# Patient Record
Sex: Female | Born: 1965
Health system: Southern US, Community
[De-identification: ages and names within clinical notes are randomized; demographics above are authoritative.]

## PROBLEM LIST (undated history)

## (undated) DIAGNOSIS — E119 Type 2 diabetes mellitus without complications: Secondary | ICD-10-CM

## (undated) DIAGNOSIS — Z923 Personal history of irradiation: Secondary | ICD-10-CM

## (undated) DIAGNOSIS — C801 Malignant (primary) neoplasm, unspecified: Secondary | ICD-10-CM

## (undated) DIAGNOSIS — Z1501 Genetic susceptibility to malignant neoplasm of breast: Secondary | ICD-10-CM

## (undated) DIAGNOSIS — Z9889 Other specified postprocedural states: Secondary | ICD-10-CM

## (undated) DIAGNOSIS — Z1502 Genetic susceptibility to malignant neoplasm of ovary: Secondary | ICD-10-CM

## (undated) DIAGNOSIS — R112 Nausea with vomiting, unspecified: Secondary | ICD-10-CM

## (undated) DIAGNOSIS — Z8041 Family history of malignant neoplasm of ovary: Secondary | ICD-10-CM

## (undated) DIAGNOSIS — Z806 Family history of leukemia: Secondary | ICD-10-CM

## (undated) DIAGNOSIS — Z803 Family history of malignant neoplasm of breast: Secondary | ICD-10-CM

## (undated) DIAGNOSIS — T8859XA Other complications of anesthesia, initial encounter: Secondary | ICD-10-CM

## (undated) DIAGNOSIS — D259 Leiomyoma of uterus, unspecified: Secondary | ICD-10-CM

## (undated) DIAGNOSIS — H269 Unspecified cataract: Secondary | ICD-10-CM

## (undated) DIAGNOSIS — Z8489 Family history of other specified conditions: Secondary | ICD-10-CM

## (undated) DIAGNOSIS — Z8481 Family history of carrier of genetic disease: Secondary | ICD-10-CM

## (undated) DIAGNOSIS — M1990 Unspecified osteoarthritis, unspecified site: Secondary | ICD-10-CM

## (undated) DIAGNOSIS — I872 Venous insufficiency (chronic) (peripheral): Secondary | ICD-10-CM

## (undated) DIAGNOSIS — N809 Endometriosis, unspecified: Secondary | ICD-10-CM

## (undated) DIAGNOSIS — Z800 Family history of malignant neoplasm of digestive organs: Secondary | ICD-10-CM

## (undated) DIAGNOSIS — K219 Gastro-esophageal reflux disease without esophagitis: Secondary | ICD-10-CM

## (undated) DIAGNOSIS — G4730 Sleep apnea, unspecified: Secondary | ICD-10-CM

## (undated) HISTORY — DX: Genetic susceptibility to malignant neoplasm of breast: Z15.01

## (undated) HISTORY — DX: Family history of malignant neoplasm of breast: Z80.3

## (undated) HISTORY — DX: Genetic susceptibility to malignant neoplasm of breast: Z15.02

## (undated) HISTORY — DX: Type 2 diabetes mellitus without complications: E11.9

## (undated) HISTORY — PX: OTHER SURGICAL HISTORY: SHX169

## (undated) HISTORY — PX: CHOLECYSTECTOMY: SHX55

## (undated) HISTORY — DX: Endometriosis, unspecified: N80.9

## (undated) HISTORY — DX: Family history of leukemia: Z80.6

## (undated) HISTORY — DX: Leiomyoma of uterus, unspecified: D25.9

## (undated) HISTORY — PX: EYE SURGERY: SHX253

## (undated) HISTORY — DX: Family history of malignant neoplasm of digestive organs: Z80.0

## (undated) HISTORY — DX: Venous insufficiency (chronic) (peripheral): I87.2

## (undated) HISTORY — DX: Unspecified cataract: H26.9

## (undated) HISTORY — PX: WISDOM TOOTH EXTRACTION: SHX21

## (undated) HISTORY — DX: Family history of carrier of genetic disease: Z84.81

## (undated) HISTORY — PX: MYOMECTOMY: SHX85

## (undated) HISTORY — PX: APPENDECTOMY: SHX54

## (undated) HISTORY — DX: Family history of malignant neoplasm of ovary: Z80.41

## (undated) HISTORY — DX: Sleep apnea, unspecified: G47.30

---

## 2000-07-23 ENCOUNTER — Ambulatory Visit (HOSPITAL_COMMUNITY): Admission: RE | Admit: 2000-07-23 | Discharge: 2000-07-23 | Payer: Self-pay | Admitting: Family Medicine

## 2003-05-18 ENCOUNTER — Ambulatory Visit (HOSPITAL_COMMUNITY): Admission: RE | Admit: 2003-05-18 | Discharge: 2003-05-18 | Payer: Self-pay | Admitting: Family Medicine

## 2004-02-04 ENCOUNTER — Emergency Department (HOSPITAL_COMMUNITY): Admission: EM | Admit: 2004-02-04 | Discharge: 2004-02-04 | Payer: Self-pay | Admitting: Emergency Medicine

## 2004-02-04 IMAGING — US US ABDOMEN COMPLETE
1 series · 14 of 25 positions shown · non-contrast
Comparison: none

CLINICAL DATA: 38-year-old with abdominal pain. 
 ABDOMINAL ULTRASOUND COMPLETE:
 Within the liver, there are two well circumscribed hyperechoic lesions measuring 1.5 X 1.4 X 1.2 cm and 1.2 X 1.1 X 1.0 cm.  The findings are consistent with small hemangiomas.  The gallbladder has a normal appearance.  The inferior vena cava and visualized abdominal aorta are unremarkable. The pancreas is obscured by bowel gas.  No focal abnormality is seen within the spleen or kidneys.

[Series 1: abdomen · 0.33mm/px · 14 of 70 slices shown]
[im 1/70]
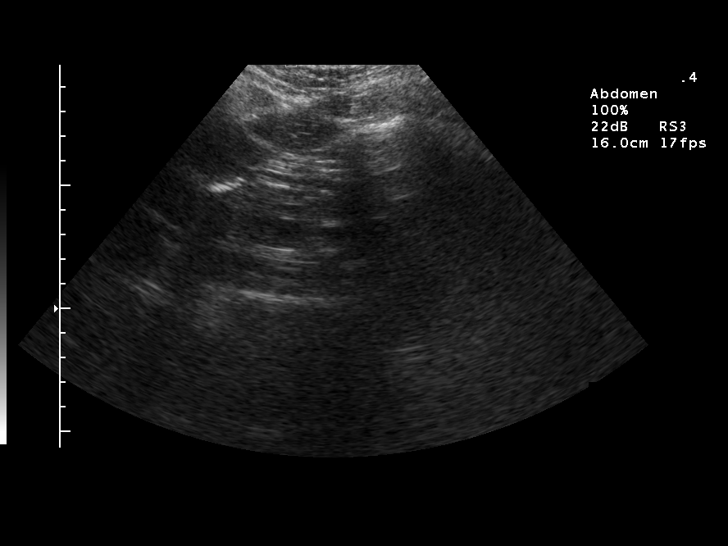
[im 6/70]
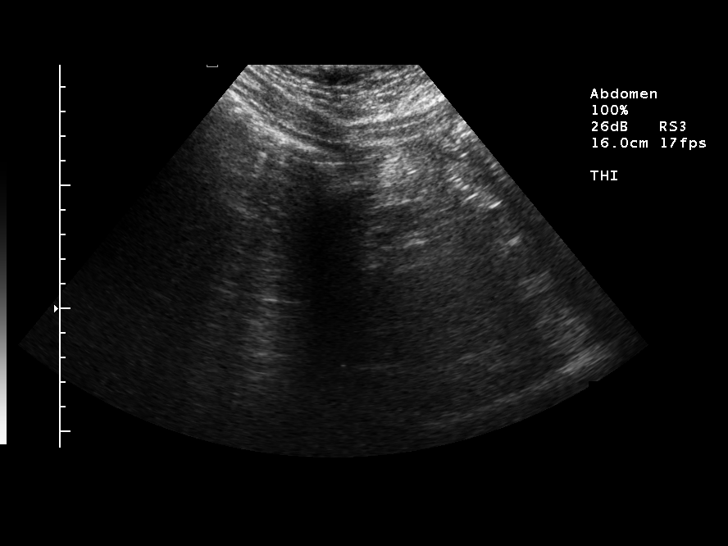
[im 12/70]
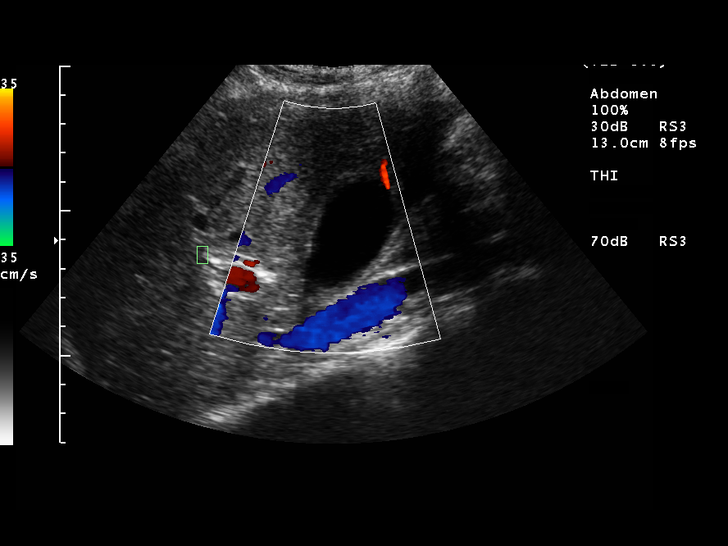
[im 18/70]
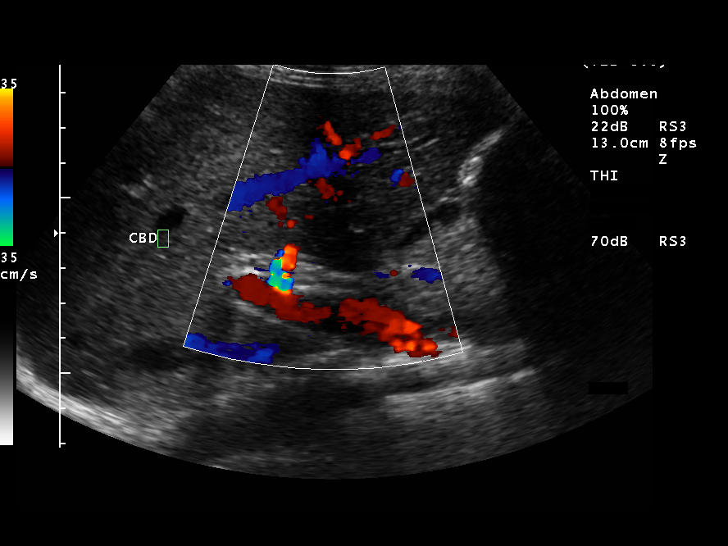
[im 24/70]
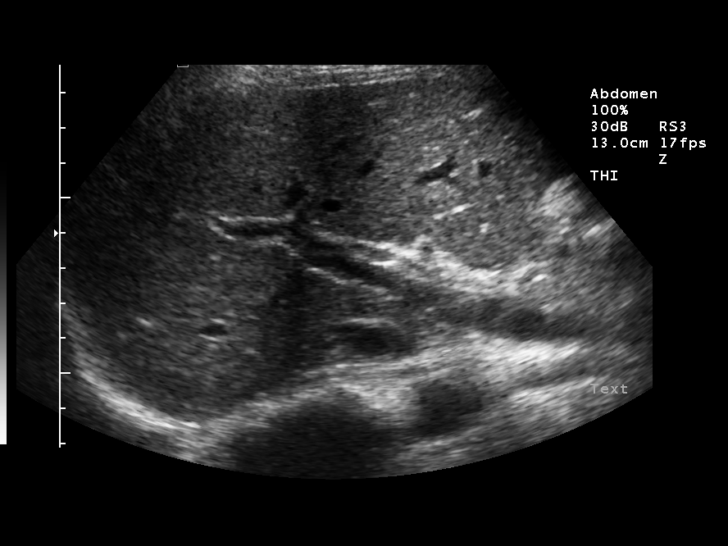
[im 26/70]
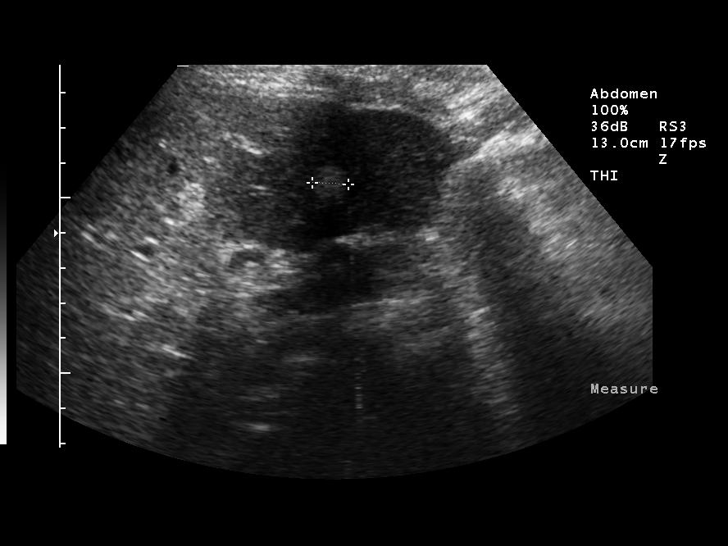
[im 32/70]
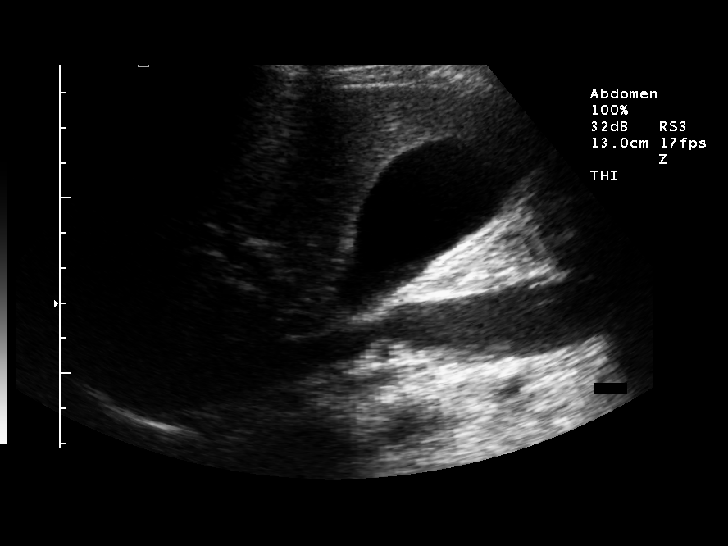
[im 38/70]
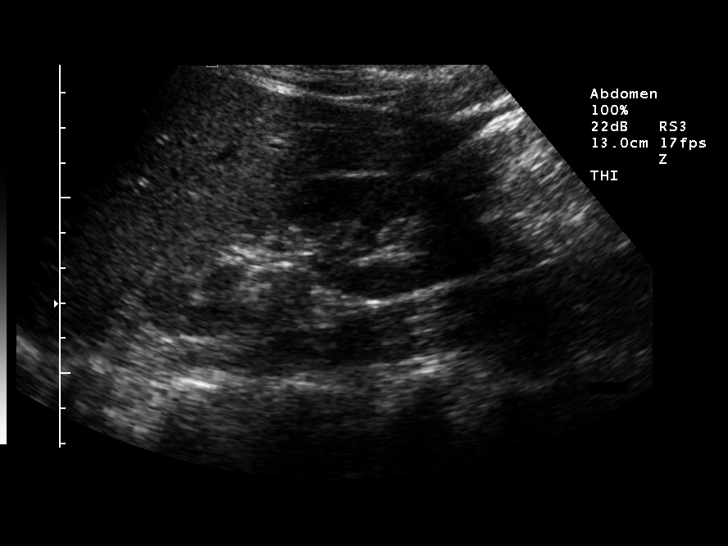
[im 44/70]
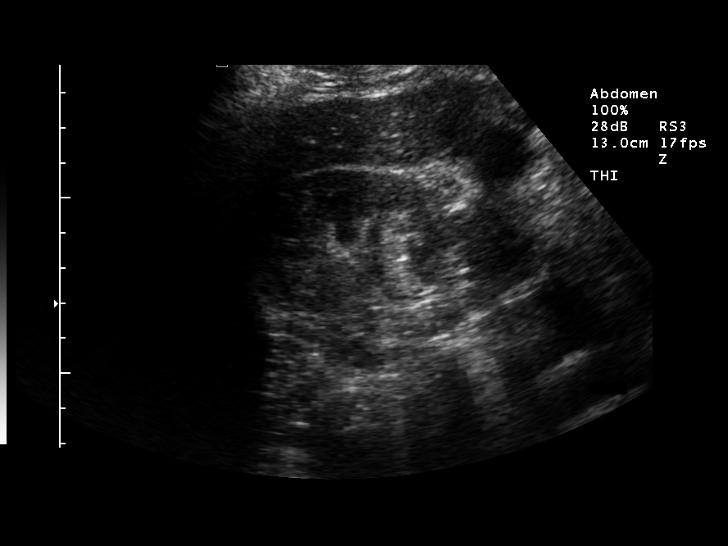
[im 47/70]
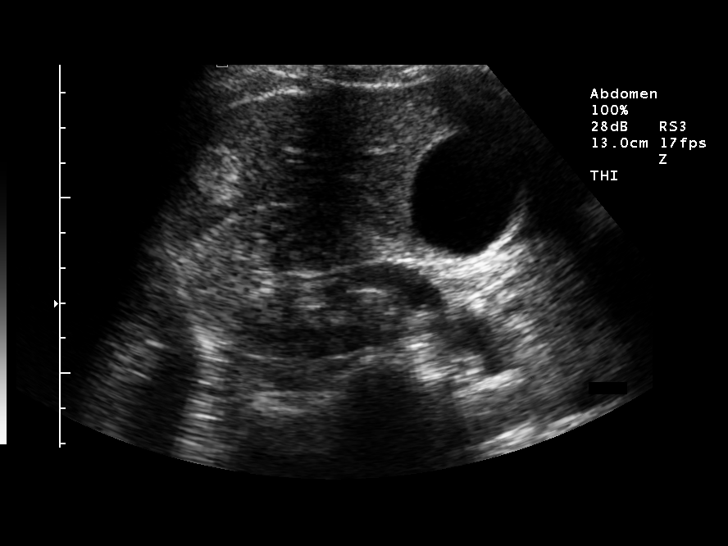
[im 52/70]
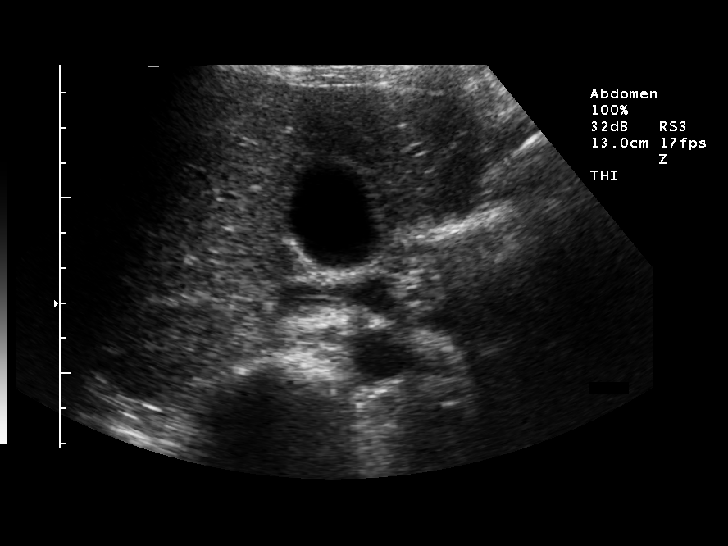
[im 58/70]
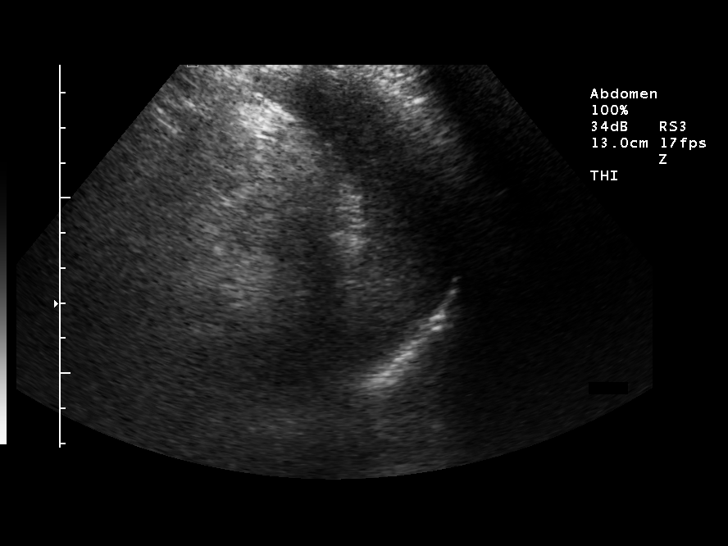
[im 64/70]
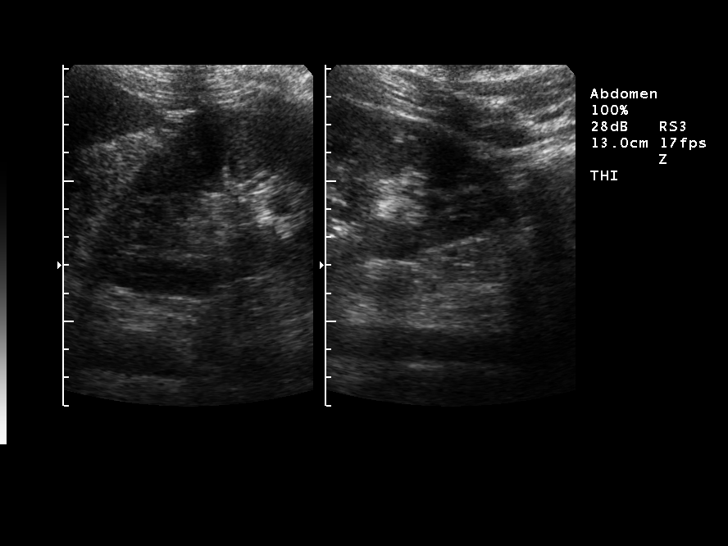
[im 70/70]
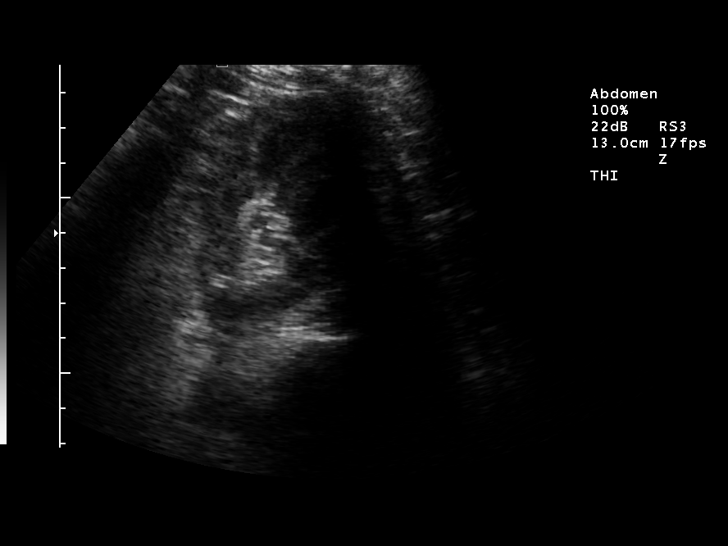

[14 of 25 positions shown; findings below may reference images not displayed]

IMPRESSION: 1.  Small hemangiomas within the liver.  No evidence for acute cholecystitis or other abdominal abnormality.
 2.  Obscured pancreas.

## 2005-03-26 ENCOUNTER — Other Ambulatory Visit: Admission: RE | Admit: 2005-03-26 | Discharge: 2005-03-26 | Payer: Self-pay | Admitting: Obstetrics and Gynecology

## 2005-04-01 ENCOUNTER — Emergency Department (HOSPITAL_COMMUNITY): Admission: EM | Admit: 2005-04-01 | Discharge: 2005-04-01 | Payer: Self-pay | Admitting: Emergency Medicine

## 2005-06-02 ENCOUNTER — Emergency Department (HOSPITAL_COMMUNITY): Admission: EM | Admit: 2005-06-02 | Discharge: 2005-06-02 | Payer: Self-pay | Admitting: Emergency Medicine

## 2005-06-02 IMAGING — US US ABDOMEN COMPLETE
1 series · 13 of 25 positions shown · non-contrast
Comparison: [DATE].
ABDOMEN ULTRASOUND:

CLINICAL DATA: 39-year-old female with abdominal pain.  Vomiting.  Patient has had a previous left ovarian cystectomy and myometomy.
TECHNIQUE: Complete abdominal ultrasound examination was performed including evaluation of the liver, gallbladder, bile ducts, pancreas, kidneys, spleen, IVC, and abdominal aorta.

[Series 1: unknown · 0.34mm/px · 13 of 58 slices shown]
[im 1/58]
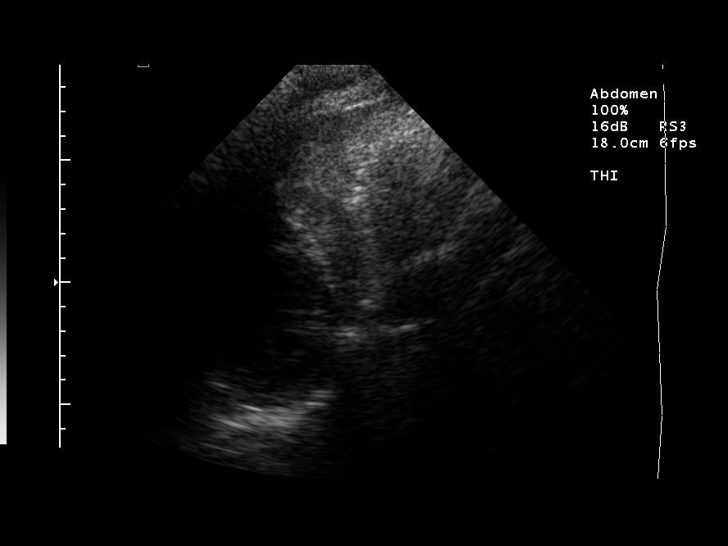
[im 5/58]
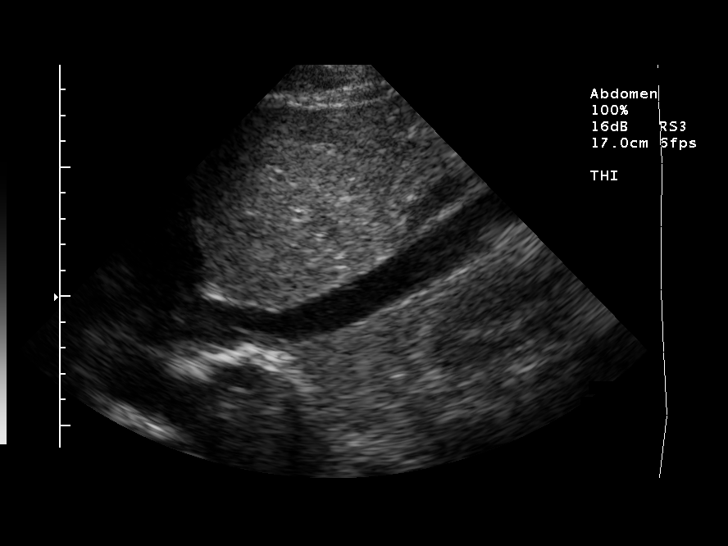
[im 10/58]
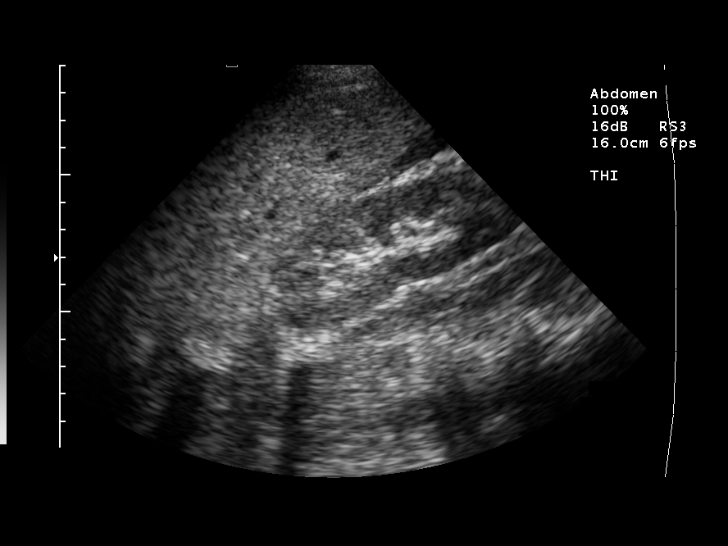
[im 15/58]
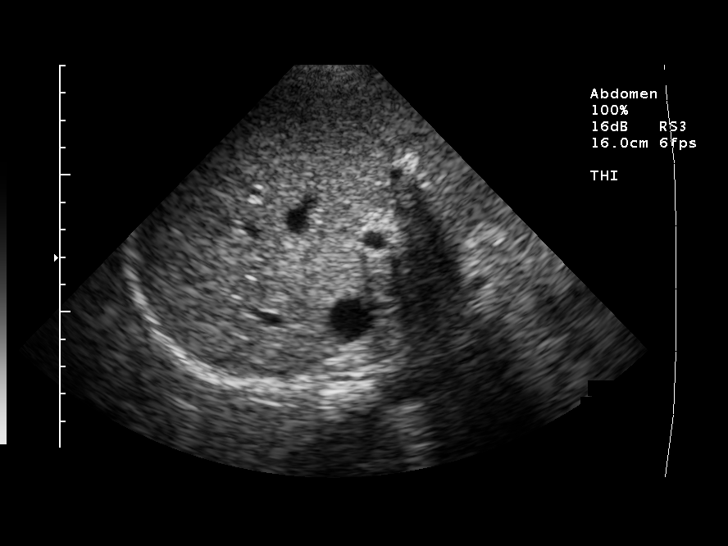
[im 20/58]
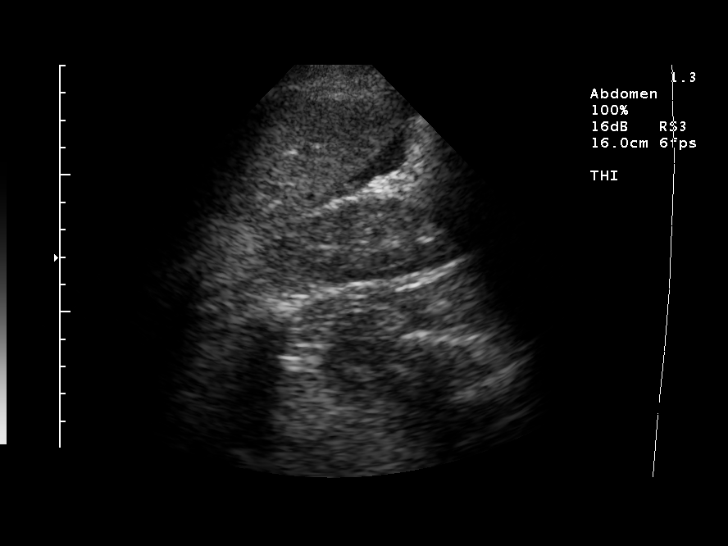
[im 24/58]
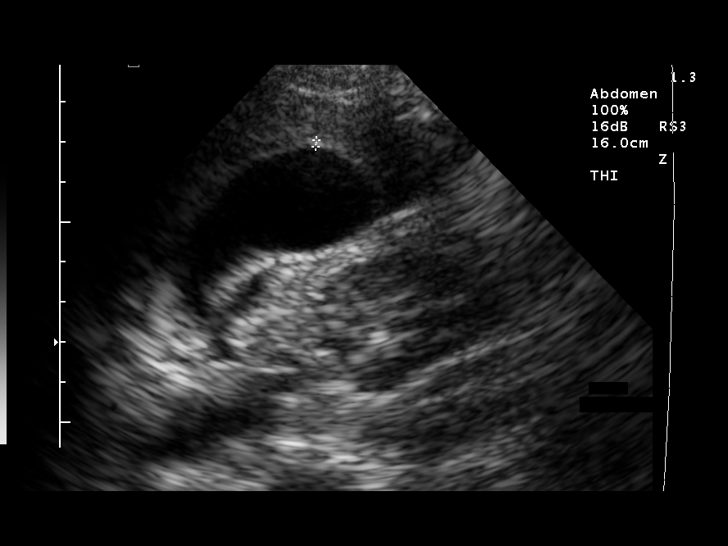
[im 29/58]
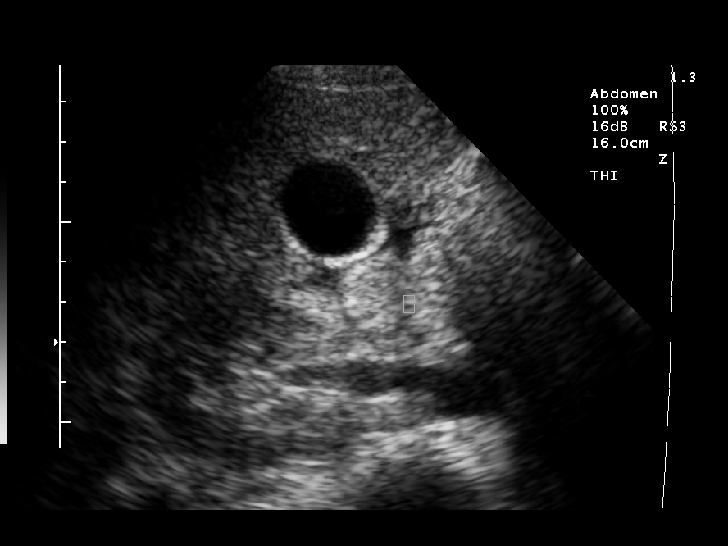
[im 34/58]
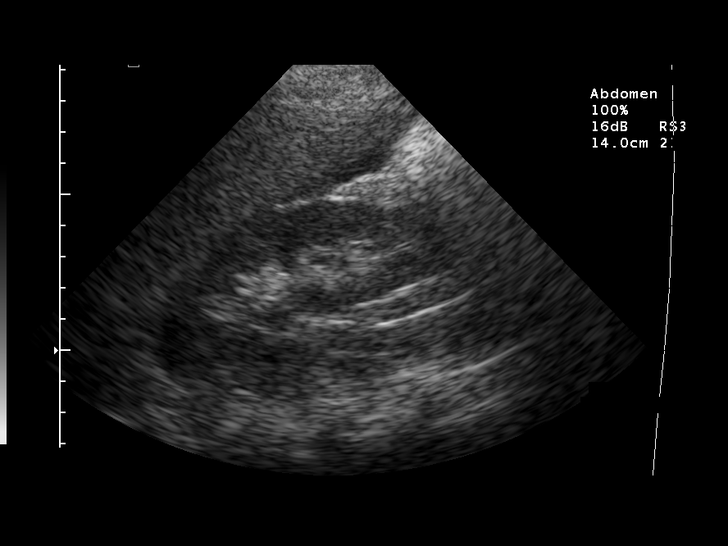
[im 39/58]
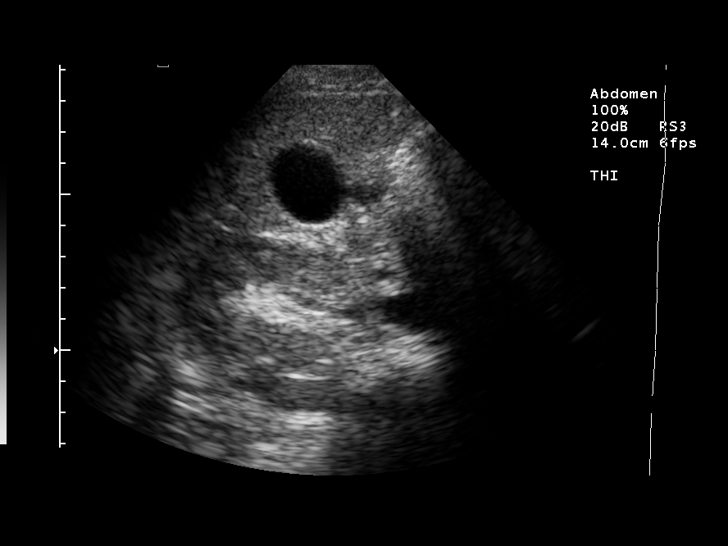
[im 43/58]
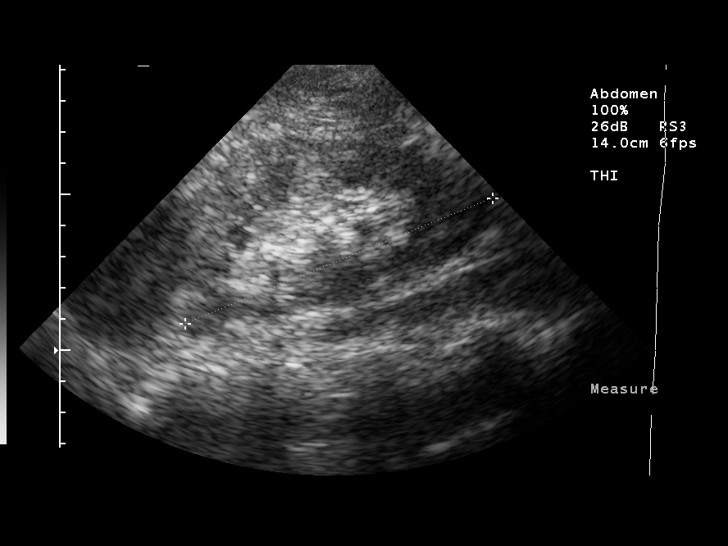
[im 48/58]
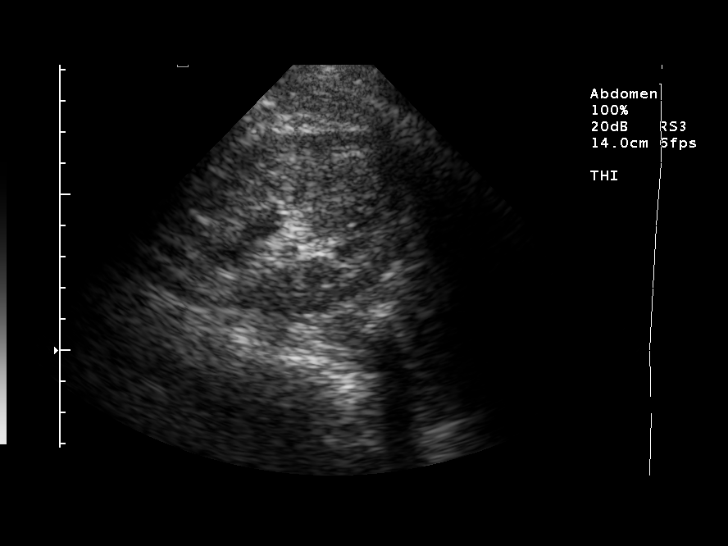
[im 53/58]
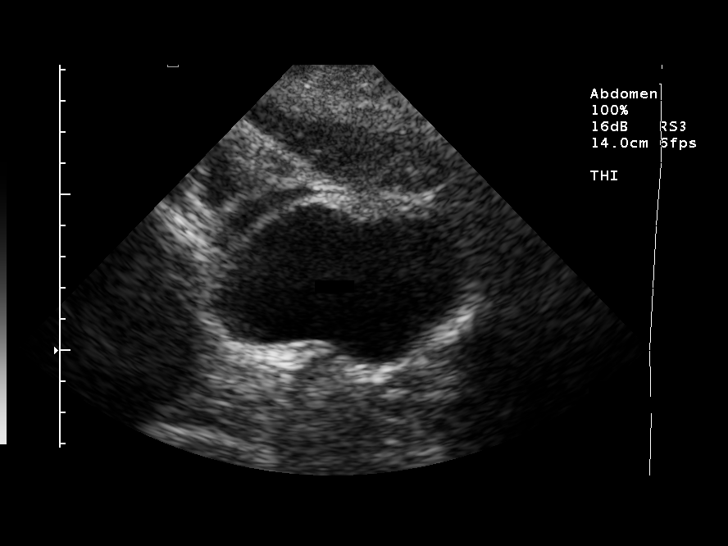
[im 58/58]
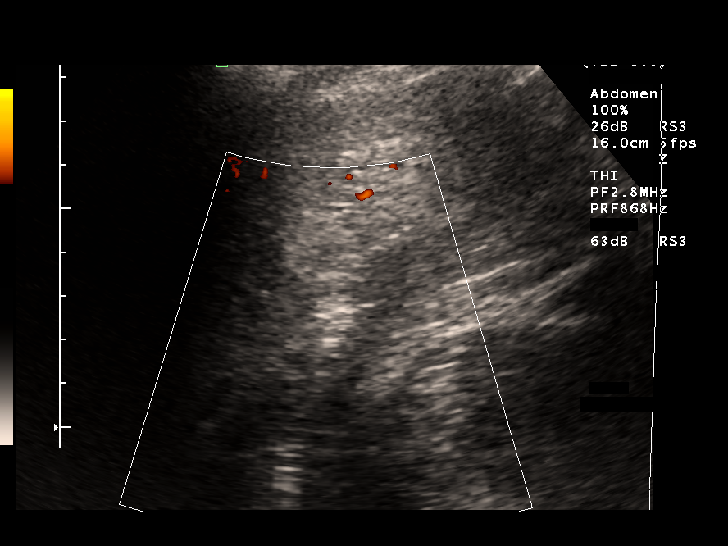

[13 of 25 positions shown; findings below may reference images not displayed]

FINDINGS: The gallbladder, common bile duct, IVC, spleen, kidneys, and aorta are all within normal limits.  No acute finding by abdominal ultrasound.  Specifically, the gallbladder wall thickness is normal at 1.6 mm.  No gallstones or gallbladder wall thickening.  There was no evidence of a Murphy?s sign.  A small amount of abdominal ascites is noted with some pericholecystic fluid.  The common bile duct diameter is 2.7 mm.  The liver has a length of 14 cm.  Within the liver, there is a stable echogenic focus in the right hepatic dome, consistent with a hemangioma compared to the previous study.  The second previous hemangioma in the left hepatic lobe lateral segment was not visualized on today?s study.  Imaging of the pancreas was limited because of bowel gas.  The spleen measures 8 cm.  The right kidney measures 10 cm and the left kidney measures 10.6 cm.  Aorta has a maximal diameter of 18 mm.  No aneurysm or ascites.  In the pelvis, there is a small amount of free fluid.
IMPRESSION: 1.  Small amount of abdominal and pelvic free fluid, consistent with ascites.  No clear acute etiology by abdominal ultrasound.  
2.  Normal gallbladder without gallstones.  
3.  No biliary dilatation.

## 2005-06-03 IMAGING — CT CT ABDOMEN W/ CM
3 of 5 series · 14 of 32 positions shown, 18 images · IV contrast (100 ML OMNI 300)
Comparison: none

CLINICAL DATA: 39-year-old female with abdominal pain, nausea, and vomiting.  Recently postoperative ovarian surgery and myomectomy 3 weeks ago.  
 ABDOMEN CT WITH CONTRAST:
TECHNIQUE: Multidetector CT imaging of the abdomen was performed following the standard protocol during bolus administration of intravenous contrast.
 Contrast:  100 cc Omnipaque 300.
TECHNIQUE: Multidetector CT imaging of the pelvis was performed following the standard protocol during bolus administration of intravenous contrast.

[Series 2: routine abdomen · axial · 0.60mm/px · z∈[-321,-111]mm · 3 of 84 slices shown, 7 images]
[im 21/84  soft-tissue]
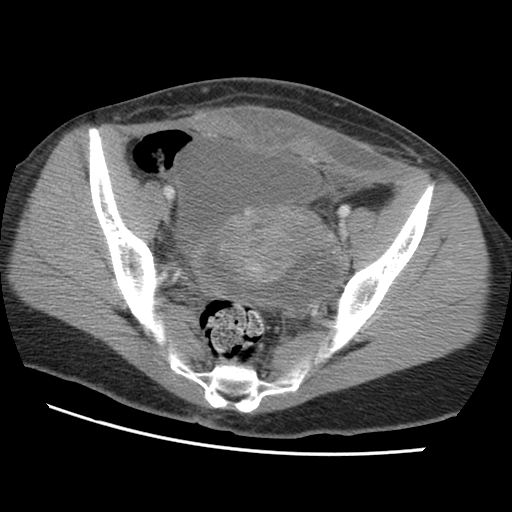
[im 21/84  lung]
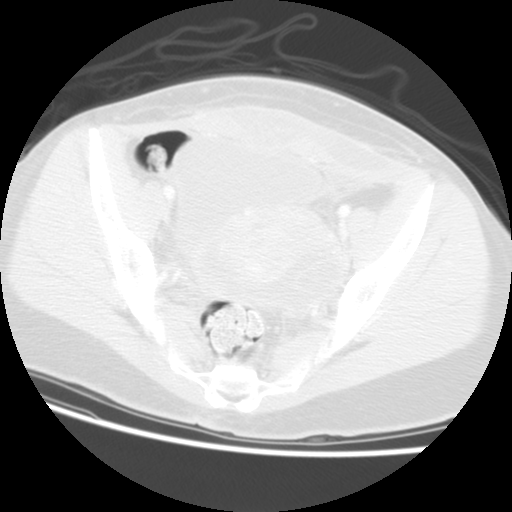
[im 21/84  bone]
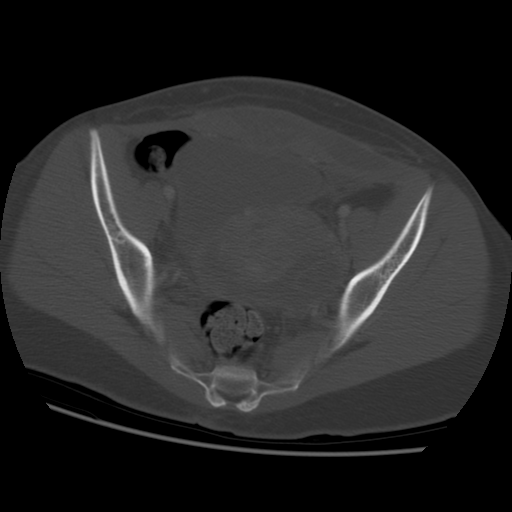
[im 42/84  soft-tissue]
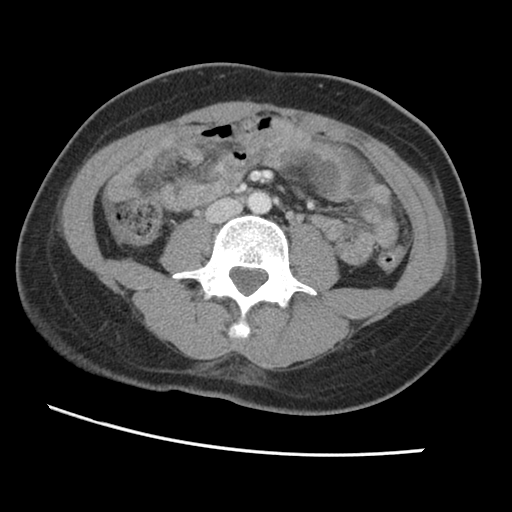
[im 42/84  lung]
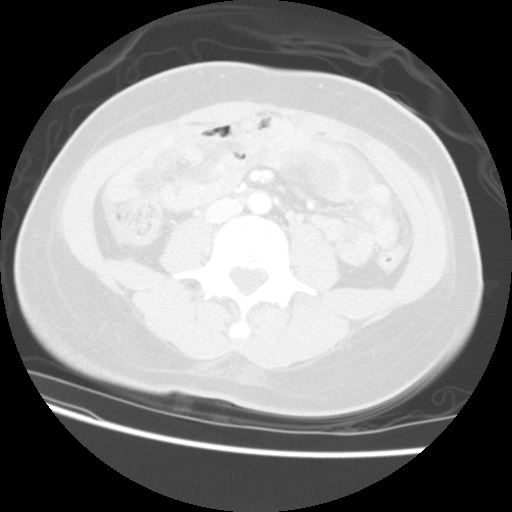
[im 63/84  soft-tissue]
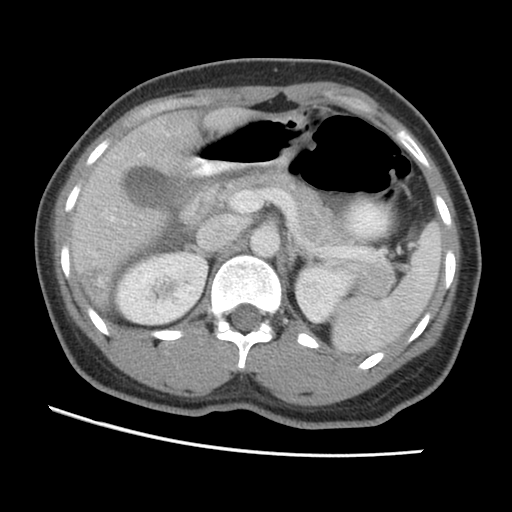
[im 63/84  lung]
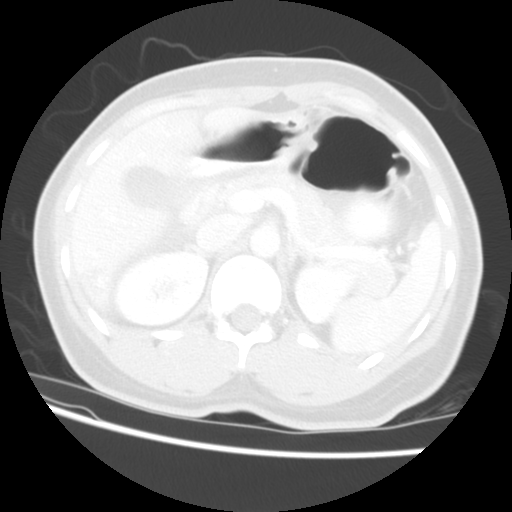

[Series 104: reformatted · coronal · 0.88mm/px · 3 of 202 slices shown (1 of 2)]
[im 21/202  soft-tissue]
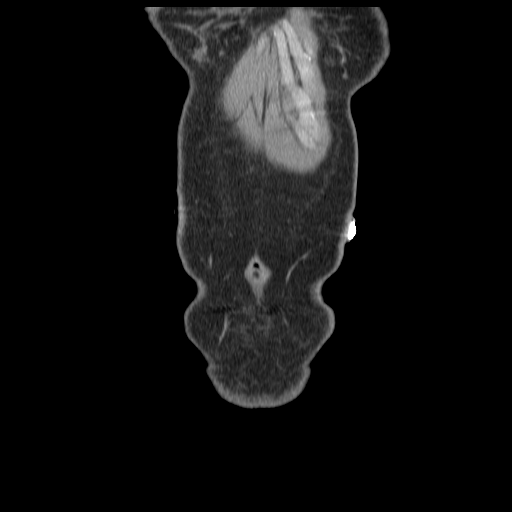
[im 41/202  soft-tissue]
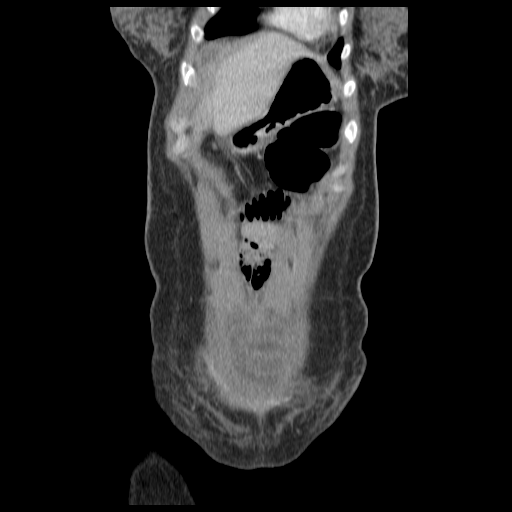
[im 61/202  soft-tissue]
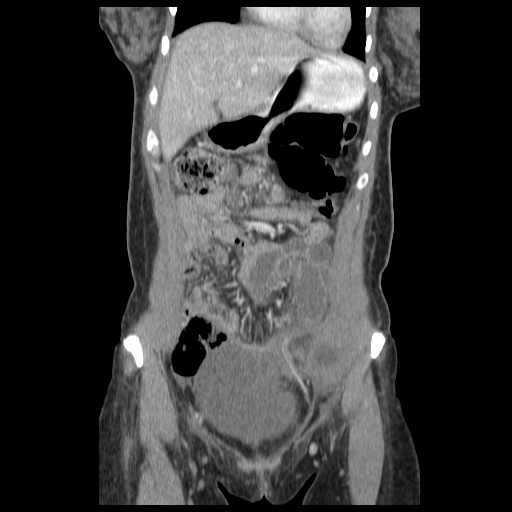

[Series 105: reformatted · sagittal · 0.88mm/px · 8 of 234 slices shown (2 of 2)]
[im 20/234  soft-tissue]
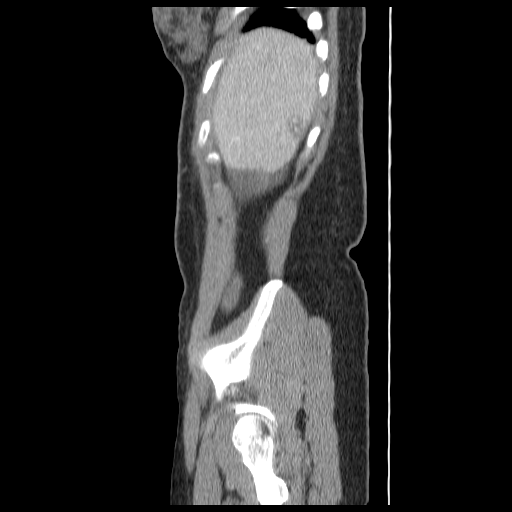
[im 59/234  soft-tissue]
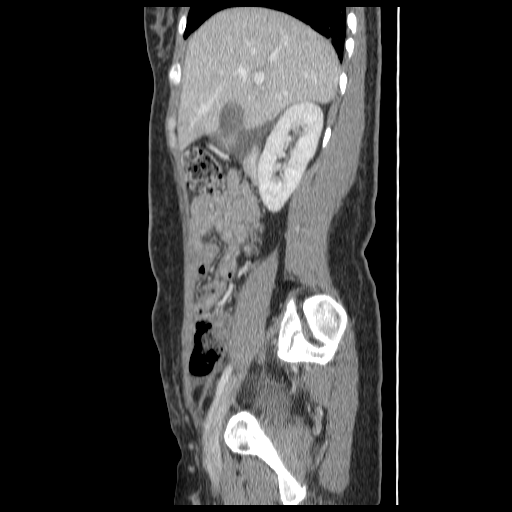
[im 78/234  soft-tissue]
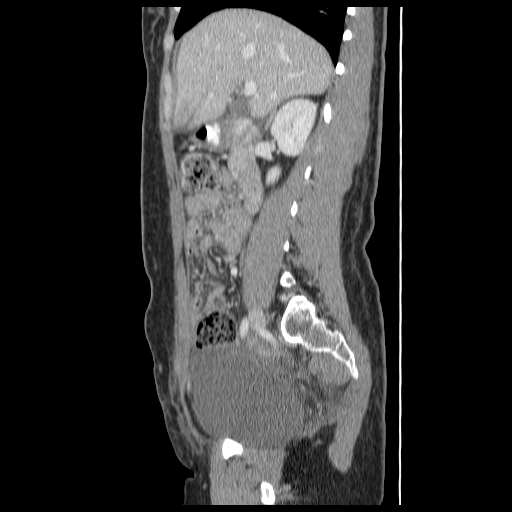
[im 98/234  soft-tissue]
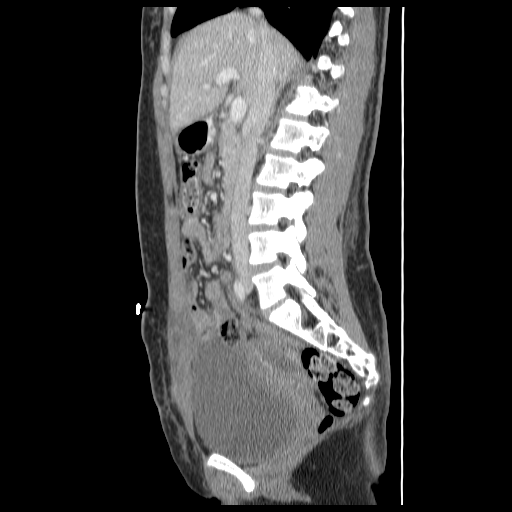
[im 136/234  soft-tissue]
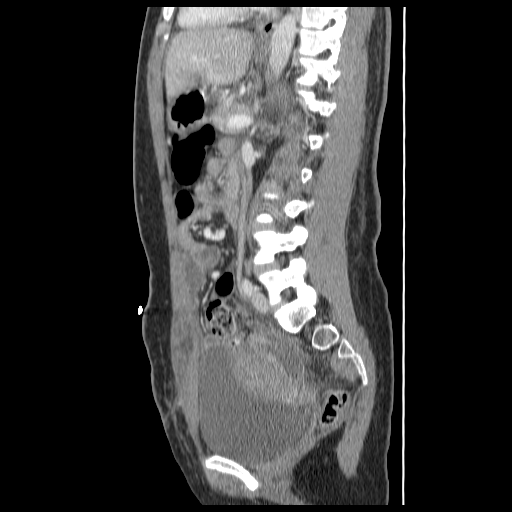
[im 156/234  soft-tissue]
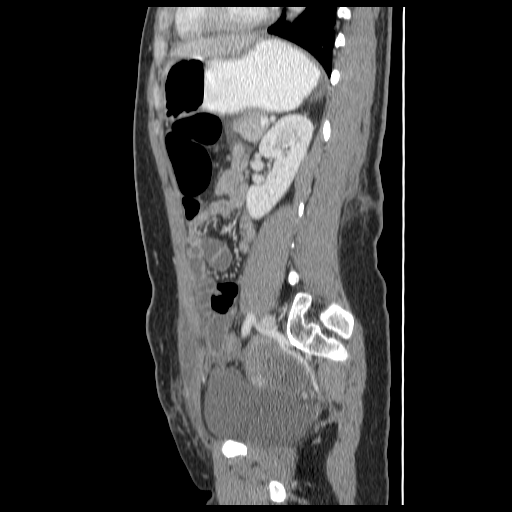
[im 175/234  soft-tissue]
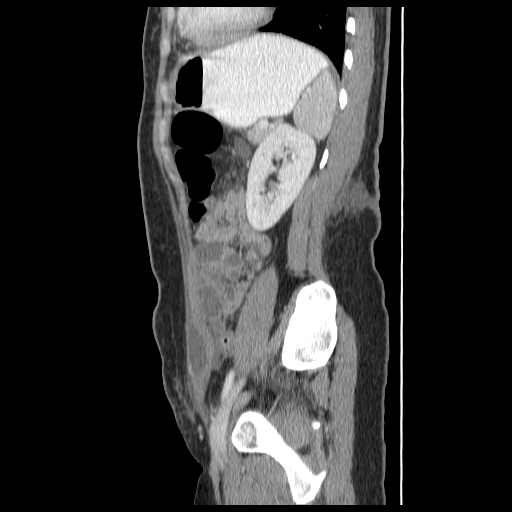
[im 214/234  soft-tissue]
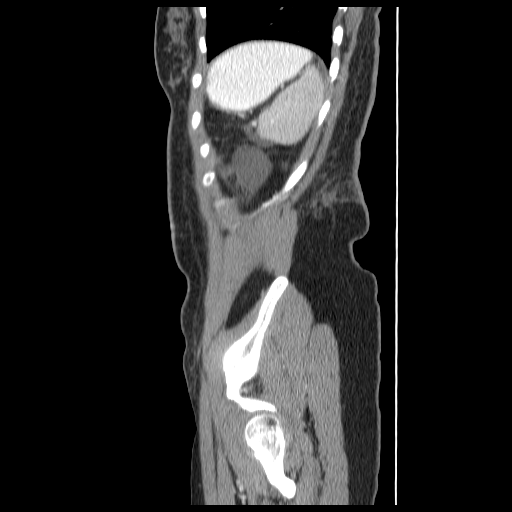

[14 of 32 positions shown; findings below may reference images not displayed]

FINDINGS: Bibasilar atelectasis.  No pericardial or pleural fluid.  Normal heart size.  The liver demonstrates focal fatty infiltration along the falciform ligament.  In the right hepatic lobe, there are 2 vascular enhancing lesions, 1 anteriorly on image #17 and 1 inferiorly and posterior on image #22.  These are consistent with hemangiomas.  Hepatic and portal veins are patent.  The gallbladder, biliary system, kidneys, adrenal glands, pancreas, and spleen are normal.  A small amount of perihepatic and perisplenic ascites is evident.  Free fluid extends along both paracolic gutters into the pelvis.  No bowel obstruction, definite wall thickening, adenopathy, or free air.  There is gaseous distention of the splenic flexure.
IMPRESSION: 1.  Hepatic hemangiomas.  
 2.  Focal fatty infiltration of the liver anteriorly.  
 3.  Small amount of abdominal ascites.
 PELVIS CT WITH CONTRAST:
FINDINGS: Continuing into the pelvis, below the umbilicus, there is a small midline ventral hernia with protrusion of fat.  Also, along the anterior lower abdominal wall, there is an elongated fluid collection within the abdominal wall musculature and fascia.  This could represent a resolving seroma or hematoma.  No significant inflammation or air within the cavity to suggest an abscess.  Small amount of free fluid is noted in the pelvis.  The uterus is heterogeneous with scattered areas of irregular enhancement and low attenuation with mild enlargement.  This may relate to postoperative changes from myomectomy.  The bladder is moderately distended.  Left adnexal low density lesion is noted, roughly measuring 4 cm.  This could represent a left ovarian cyst.
IMPRESSION: 1.  Small midline ventral hernia below the umbilicus containing fat.  
 2.  Adjacent peri-ventral hernia, abdominal musculature fluid collection.  This could represent postoperative hematoma, seroma, or less likely abscess.  
 3.  Small amount of pelvic free fluid.  
 4.  Heterogeneous changes of the uterus, nonspecific, but likely postoperative.
 5.  Left adnexal 4 cm low density lesion, possible ovarian cyst.  Ultrasound correlation could confirm.

## 2005-06-03 IMAGING — CR DG ABDOMEN ACUTE W/ 1V CHEST
3 series · 3 of 3 positions shown · non-contrast
Comparison: Abdominal ultrasound of the same day.

CLINICAL DATA: 39 year old female; abdominal pain and vomiting.
 ACUTE ABDOMINAL SERIES WITH CHEST ? 3 VIEWS ? [DATE]:

[w chest pa]
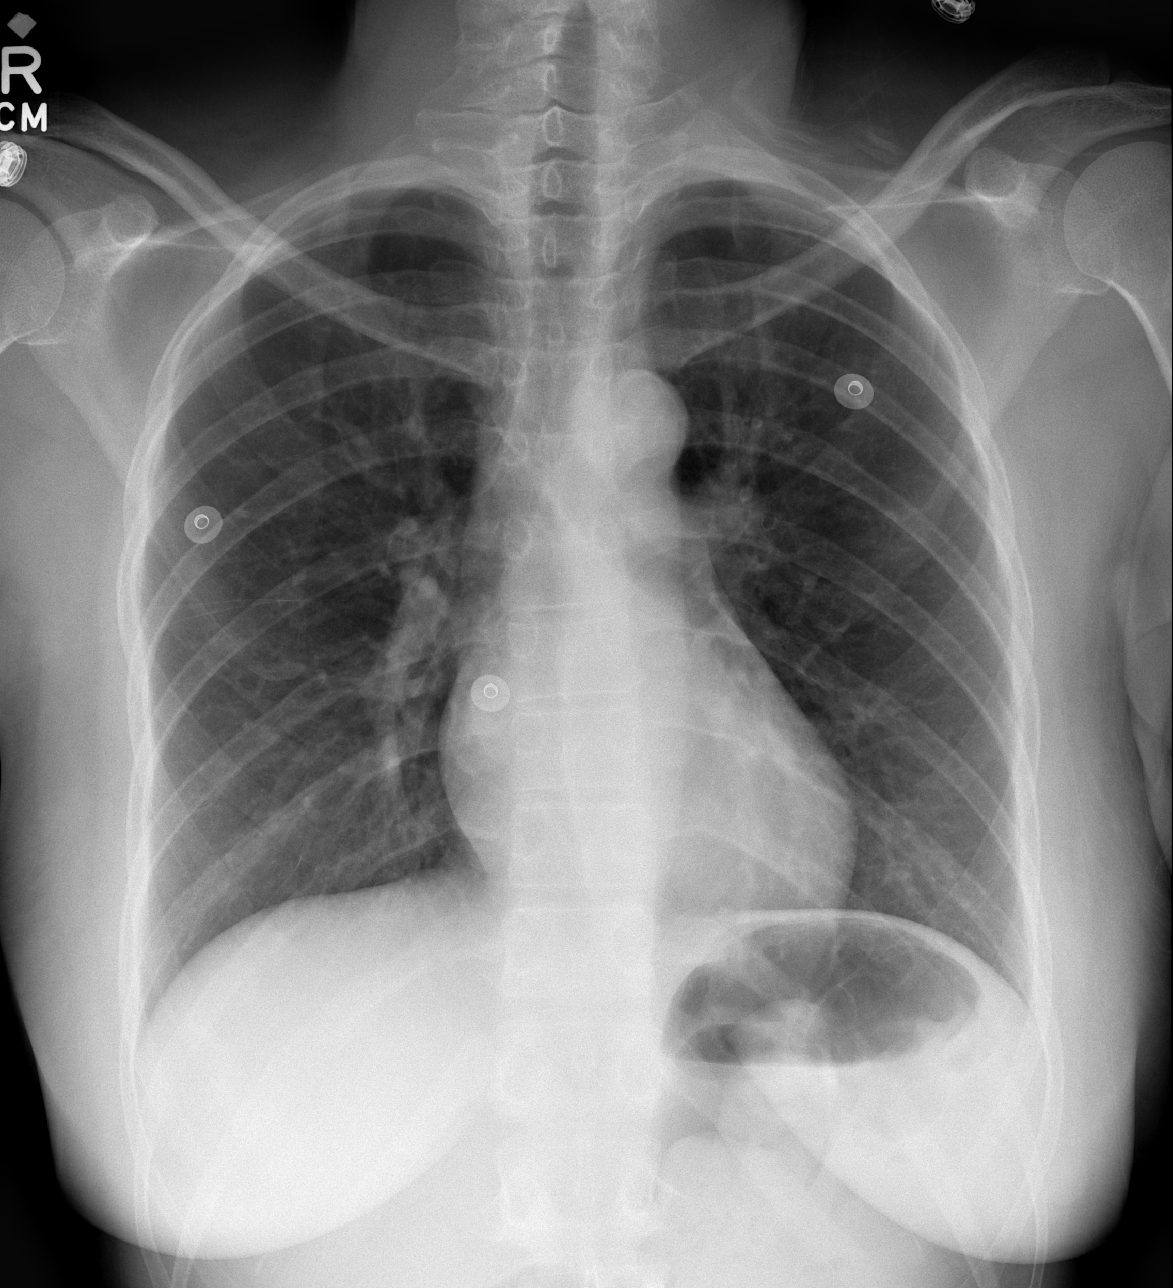

[w abdomen upright]
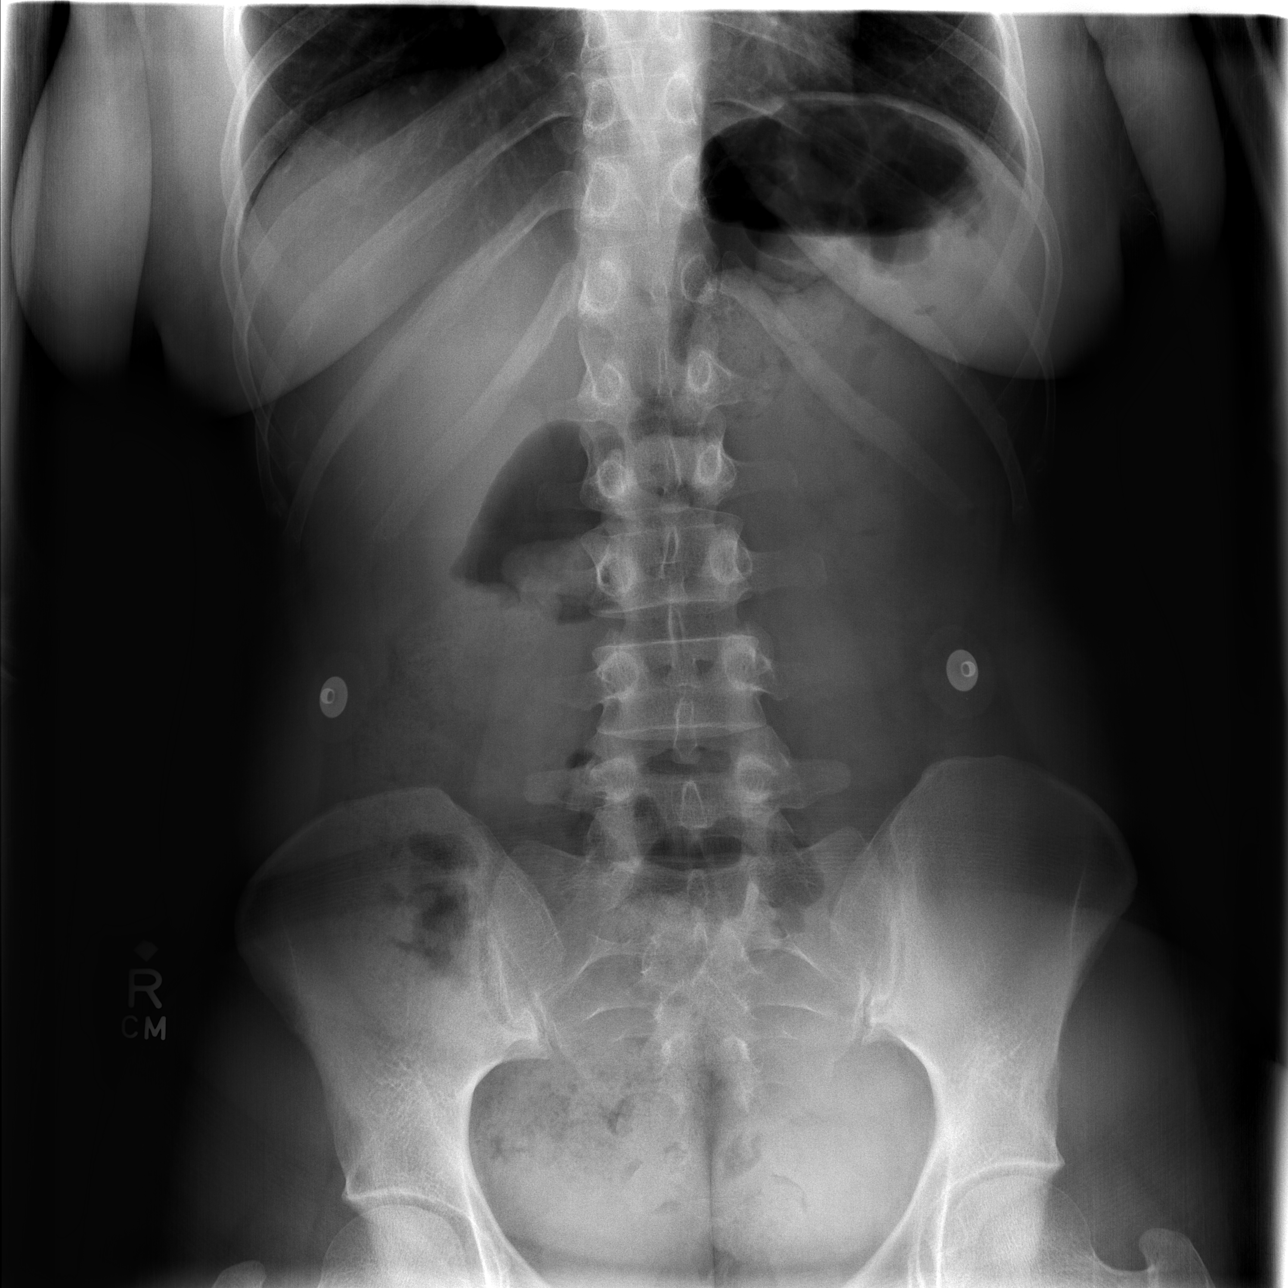

[t abdomen supine]
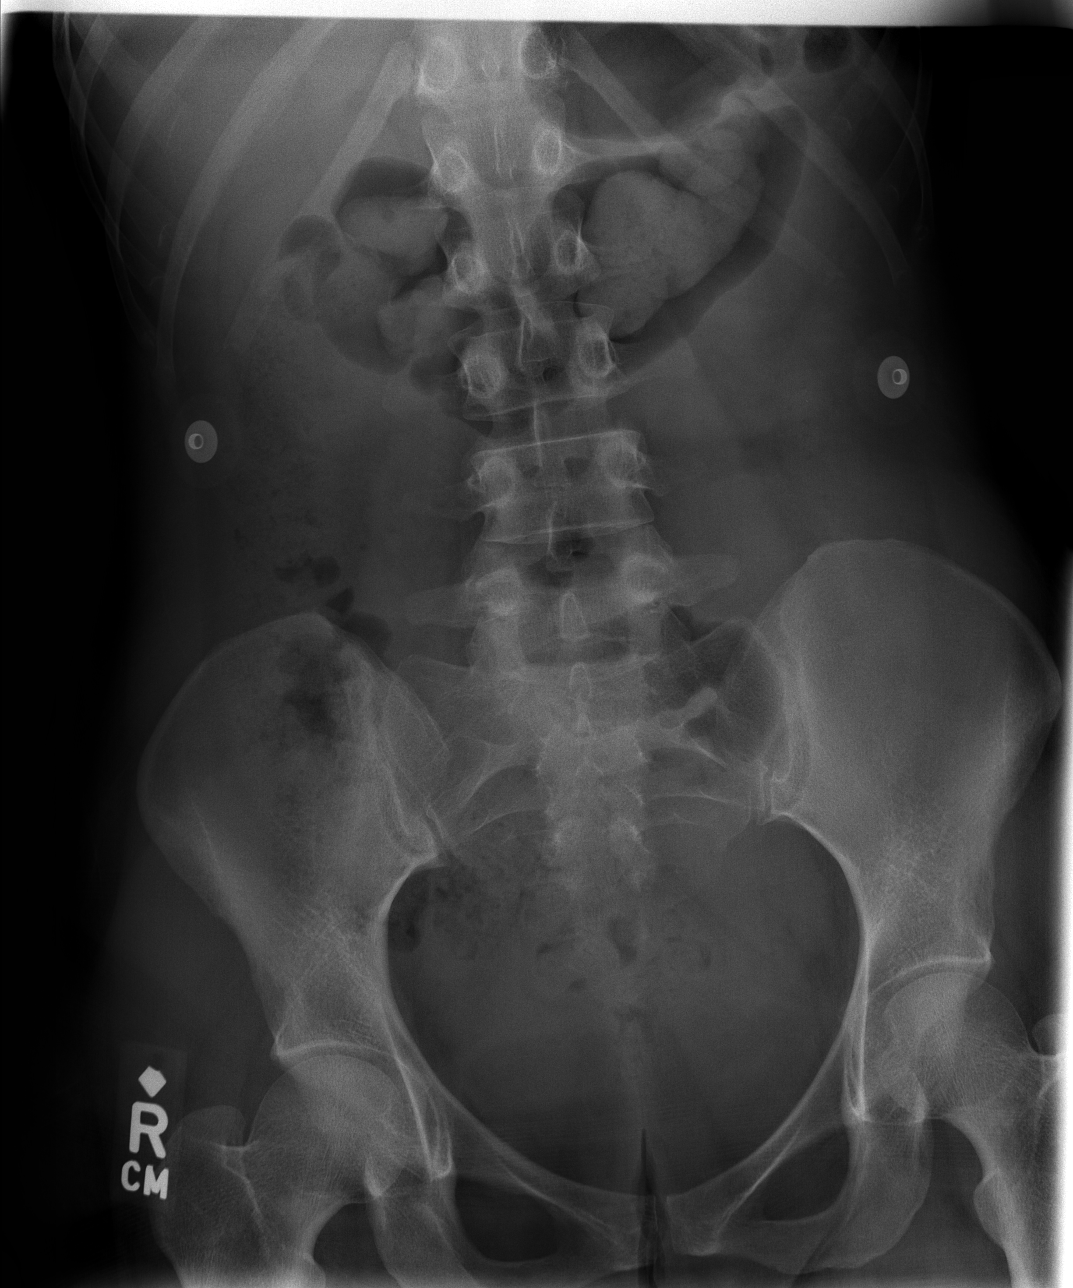

[3 of 3 positions shown; findings below may reference images not displayed]

FINDINGS: Chest - 1 View:   Clear lungs.  Normal heart size.   No acute pneumonia, edema, effusion, or pneumothorax.  No free air.
 Abdomen -  2 Views:   There is moderate constipation without obstruction, mass effect, or free air.  No abnormal calcifications.
IMPRESSION: 1.  No acute chest finding.
 2.  Moderate constipation without definite obstruction.  No free air.

## 2005-06-13 ENCOUNTER — Emergency Department (HOSPITAL_COMMUNITY): Admission: EM | Admit: 2005-06-13 | Discharge: 2005-06-13 | Payer: Self-pay | Admitting: Emergency Medicine

## 2007-03-19 ENCOUNTER — Encounter: Admission: RE | Admit: 2007-03-19 | Discharge: 2007-03-19 | Payer: Self-pay | Admitting: Obstetrics and Gynecology

## 2010-02-13 ENCOUNTER — Encounter
Admission: RE | Admit: 2010-02-13 | Discharge: 2010-02-13 | Payer: Self-pay | Source: Home / Self Care | Attending: Obstetrics and Gynecology | Admitting: Obstetrics and Gynecology

## 2011-01-24 ENCOUNTER — Other Ambulatory Visit: Payer: Self-pay | Admitting: Obstetrics and Gynecology

## 2011-01-24 DIAGNOSIS — Z1231 Encounter for screening mammogram for malignant neoplasm of breast: Secondary | ICD-10-CM

## 2011-02-15 ENCOUNTER — Ambulatory Visit: Payer: Self-pay

## 2011-02-19 ENCOUNTER — Ambulatory Visit
Admission: RE | Admit: 2011-02-19 | Discharge: 2011-02-19 | Disposition: A | Discharge status: Home / Self Care | Payer: BC Managed Care – PPO | Source: Ambulatory Visit | Attending: Obstetrics and Gynecology | Admitting: Obstetrics and Gynecology

## 2011-02-19 DIAGNOSIS — Z1231 Encounter for screening mammogram for malignant neoplasm of breast: Secondary | ICD-10-CM

## 2011-02-22 ENCOUNTER — Ambulatory Visit: Payer: Self-pay

## 2012-01-13 ENCOUNTER — Other Ambulatory Visit: Payer: Self-pay | Admitting: Obstetrics and Gynecology

## 2012-01-13 DIAGNOSIS — Z1231 Encounter for screening mammogram for malignant neoplasm of breast: Secondary | ICD-10-CM

## 2012-02-20 ENCOUNTER — Ambulatory Visit
Admission: RE | Admit: 2012-02-20 | Discharge: 2012-02-20 | Disposition: A | Discharge status: Home / Self Care | Payer: BC Managed Care – PPO | Source: Ambulatory Visit | Attending: Obstetrics and Gynecology | Admitting: Obstetrics and Gynecology

## 2012-02-20 DIAGNOSIS — Z1231 Encounter for screening mammogram for malignant neoplasm of breast: Secondary | ICD-10-CM

## 2013-02-01 ENCOUNTER — Other Ambulatory Visit: Payer: Self-pay

## 2013-02-01 DIAGNOSIS — Z1231 Encounter for screening mammogram for malignant neoplasm of breast: Secondary | ICD-10-CM

## 2013-02-17 ENCOUNTER — Ambulatory Visit: Payer: BC Managed Care – PPO

## 2013-05-10 ENCOUNTER — Ambulatory Visit
Admission: RE | Admit: 2013-05-10 | Discharge: 2013-05-10 | Disposition: A | Discharge status: Home / Self Care | Payer: BC Managed Care – PPO | Source: Ambulatory Visit

## 2013-05-10 DIAGNOSIS — Z1231 Encounter for screening mammogram for malignant neoplasm of breast: Secondary | ICD-10-CM

## 2014-02-17 ENCOUNTER — Other Ambulatory Visit: Payer: Self-pay

## 2014-02-17 DIAGNOSIS — Z1231 Encounter for screening mammogram for malignant neoplasm of breast: Secondary | ICD-10-CM

## 2014-05-12 ENCOUNTER — Ambulatory Visit: Payer: Self-pay

## 2014-08-05 ENCOUNTER — Ambulatory Visit
Admission: RE | Admit: 2014-08-05 | Discharge: 2014-08-05 | Disposition: A | Discharge status: Home / Self Care | Payer: BLUE CROSS/BLUE SHIELD | Source: Ambulatory Visit

## 2014-08-05 DIAGNOSIS — Z1231 Encounter for screening mammogram for malignant neoplasm of breast: Secondary | ICD-10-CM

## 2014-11-21 ENCOUNTER — Ambulatory Visit (INDEPENDENT_AMBULATORY_CARE_PROVIDER_SITE_OTHER): Payer: BLUE CROSS/BLUE SHIELD | Admitting: Internal Medicine

## 2014-11-21 VITALS — BP 130/90 | HR 88 | Temp 98.9°F | Resp 16 | Ht 66.5 in | Wt 191.5 lb

## 2014-11-21 DIAGNOSIS — L97329 Non-pressure chronic ulcer of left ankle with unspecified severity: Secondary | ICD-10-CM | POA: Diagnosis not present

## 2014-11-21 NOTE — Progress Notes (Addendum)
Subjective:  This chart was scribed for Vanessa Lin, MD by [facility], medical scribe at [facility].The patient was seen in exam room 09 and the patient's care was started at 7:21 PM.   Patient ID: Vanessa Allen, female    DOB: 1965/06/15, 49 y.o.   MRN: [id number] Chief Complaint  Patient presents with  . Wound Check    Left Leg x few months   HPI HPI Comments: Vanessa Allen is a 49 y.o. female who presents to [facility] complaining of a wound over the medial aspect of her left ankle. While in [place] she bought an ankle bracelet made of shells that scratched her while [place] in the ocean. The wound did improve and scabbed over, but recently opened back up before the scabs had resolved and now has remained open and sensitive. The wound will intermittently sting, and this wakes her during the night. She has tried Keflex, bactrim, and clindamycin with no relief.   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 There is no immunocompromise situation.  Review of Systems  Constitutional: Negative for fever, chills, activity change and unexpected weight change.  Gastrointestinal: Negative for abdominal pain and diarrhea.  Musculoskeletal: Negative for joint swelling and arthralgias.  Skin: Positive for wound.  Neurological: Negative for headaches.      Objective:  BP 130/90 mmHg  Pulse 88  Temp(Src) 98.9 F (37.2 C) (Oral)  Resp 16  Ht 5' 6.5" (1.689 m)  Wt 191 lb 8 oz (86.864 kg)  BMI 30.45 kg/m2  SpO2 99% Physical Exam  Constitutional: She is oriented to person, place, and time. She appears well-developed and well-nourished. No distress.  HENT:  Head: Normocephalic and atraumatic.  Eyes: Pupils are equal, round, and reactive to light.  Neck:  Normal range of motion.  Cardiovascular: Normal rate and regular rhythm.   Pulmonary/Chest: Effort normal. No respiratory distress.  Musculoskeletal: Normal range of motion.  Neurological: She is alert and oriented to person, place, and time.  Skin: Skin is warm and dry.  Medial aspect of right lower leg demonstrates a 4 cm x 2 cm with open ulceration in two different spots. This is sensitive to touch. There is no surrounding cellulitis. No bony involvement. Ankle has a full range of motion. Full distal pulses.  Psychiatric: She has a normal mood and affect. Her behavior is normal.  Nursing note and vitals reviewed.     Assessment & Plan:  Ulcer of ankle, left, with unspecified severity ([place]) - Plan: Dermatology pathology, Wound culture  with her lack of response to doxycycline plus Bactrim and then clindamycin this should represent an unusual condition. The differential diagnosis would include a marine infection with Mycobacterium marinum or Shewanella spp with other causes less likely since she is not toxic We will proceed with biopsy and culture She will start vigorous daily cleaning She will return to [place] to work later this week and we will notify her of results. If everything is negative she will be referred to wound care center    I have completed the patient encounter in its entirety as documented by the scribe, with editing by me where necessary. Jinnifer Montejano P. [name], M.D.  By signing my name below, I, Nadim Abuhashem, attest that this documentation has been prepared under the  direction and in the presence of Vanessa Lin, MD.  Electronically Signed: Lora Havens, medical scribe. 11/21/2014, 7:28 PM.

## 2014-11-21 NOTE — Progress Notes (Signed)
Procedure:  Consent obtained.  Local anesthesia with 2% lido plain.  34mm punch biopsy performed on the edge of the wound and inside of the ulceration.  Wound culture also obtained.

## 2014-11-24 LAB — WOUND CULTURE: Gram Stain: NONE SEEN

## 2014-12-05 NOTE — Addendum Note (Signed)
Addended by: Constance Goltz on: 12/05/2014 03:58 PM   Modules accepted: Orders

## 2015-04-11 ENCOUNTER — Telehealth: Payer: Self-pay

## 2015-04-11 NOTE — Telephone Encounter (Signed)
Pt states that she needs another referral done for Infectious Disease. She said that they never contacted her to be seen, she also said that the problem got better so she didn't worry about it but it's worse again now and she wants another referral done please.

## 2015-04-11 NOTE — Telephone Encounter (Signed)
Referrals, can we use the same order?

## 2015-04-11 NOTE — Telephone Encounter (Signed)
The referral expires in May, so we can still use the referral until then.  If the location has changed for where she wants to go, we will just want to update that in the referral notes.

## 2015-05-17 ENCOUNTER — Telehealth: Payer: Self-pay

## 2015-05-17 NOTE — Telephone Encounter (Signed)
Patient would like a call back concerning the chronic wound that she has on her ankle. She's upset that Dr. [name] didn't know what kind of medication to prescribe for her wound and he wouldn't consult with another Dr about what to prescribe her for her issue on that day . She wants to know if Dr. [name] would please call her in an antibiotic for the pain and swelling that continues to happen with her ankle. 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 Erin placed a referral for the patient 11/16 to a location in [place], [place] but patient never received a phone call for an appointment. She now lives in [place], [place] and would like to be referred to a doctor there. I expressed to patient that it's hard to do an out of state referral to just any location and this referral will expire next month. Pt states she's willing to fly back here to be seen at the Infectious Disease clinic, I told her they are scheduling 3 months out and she wouldn't be seen until July 2017. Pt is upset because she feels that we haven't done anything to help her issue. 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

## 2015-05-17 NOTE — Telephone Encounter (Signed)
Ulcer of ankle, left, with unspecified severity ([place]) -she was treated with antibiotics  with her lack of response to doxycycline plus Bactrim and then clindamycin this represents an unusual condition. The differential diagnosis would include a marine infection with Mycobacterium marinum or Shewanella spp with other causes less likely since she is not toxic   I sent the results by my chart after we couldn't reach her by phone. She had both a culture and a biopsy. Results of the biopsy and the culture did not give [place] an answer. She Needs to see an infectious disease specialist for culture and possible 2 month treatment with special meds" And she needs a diagnosis before deciding on the treatment  She must be seen before any new treatment even if she just goes to an urgent care where she works

## 2015-05-18 ENCOUNTER — Telehealth: Payer: Self-pay

## 2015-05-18 NOTE — Telephone Encounter (Signed)
I spoke with pt and advised her of Dr. Ninfa Meeker response. Pt wants me to send the referral over to [facility] in [place]. I faxed the referral over to them 05/18/15

## 2015-05-22 NOTE — Telephone Encounter (Signed)
Did anyone respond to this

## 2015-05-23 NOTE — Telephone Encounter (Signed)
Yes [name] spoke with pt.

## 2015-05-31 ENCOUNTER — Telehealth: Payer: Self-pay

## 2015-05-31 NOTE — Telephone Encounter (Signed)
Pt has seen a doctor in [place] and he sent in notes asking if Dr.Doolittle could send the tissue biopsy that was taken in Oct .2016 for AFB and antifungal stains? He states that if there are questions please contact Dr. Ander Gaster on his cell phone at [phone].

## 2015-06-01 NOTE — Telephone Encounter (Signed)
bbbbbbbbults from Oct 2016INAL DIAGNOSIS and MICROSCOPIC DESCRIPTION Diagnosis Skin , left, medial ankle ULCER WITH DENSE VASCULAR PROLIFERATION, INFLAMED, SEE DESCRIPTION Microscopic Description Sections of this biopsy of skin demonstrate ulceration with underlying dense neovascularization with a mixed inflammatory infiltrate. Extravasated red blood cells are seen. There is overlying hyperkeratosis with some parakeratosis. The following stains are performed with appropriate controls reviewed. PAS stain is negative for definitive fungal organisms. A Fite stain is negative for atypical mycobacterium. No bacterial colonies are noted on H&E sections. Comment: The overall features of this biopsy favor ulceration overlying overlying granulation tissue. Given the anatomic location and histopathology, marked stasis dermatitis, ulcerated is also a possibility. Clinical correlation is necessary. Given the clinical history of a wound secondary to being cut by a seashell, mycobacterium marinum or shewanella putrefaciens infection is a possibility; however, the organisms are often very difficult to find in skin biopsies. If this remains a clinical possibility, consideration for culture may be or utility. This case was reviewed by Dr. Fletcher Anon and Dr. Tamala Julian with general agreement. (DJT:gt, 11/23/14)   Biopsy results from that visit in October  left message for Dr. Ander Gaster on his cell phone at [phone]. He is to get [place] fax #

## 2015-06-01 NOTE — Telephone Encounter (Signed)
Please fax this biopsy report to Dr Orellana(?spelling) at [phone]

## 2015-06-01 NOTE — Telephone Encounter (Signed)
Please fax this biopsy report and wound culture to Dr Ander Gaster at [phone] Include a copy of the office note 11/21/14 as well

## 2015-06-01 NOTE — Telephone Encounter (Signed)
FINAL for SADA, VARDA [id number]) Patient: Vanessa, Allen Collected: 11/21/2014 Client: [facility] Accession: [id number] Received: 11/22/2014 [name] DOB: May 09, 1965 Age: 50 Gender: F Reported: 11/23/2014 [place] Patient Ph: [phone] MRN #: [id number] [place], [place] [address] Client Acc#: Chart #: [id number] Phone: [phone] Fax: CC: REPORT OF DERMATOPATHOLOGY FINAL DIAGNOSIS and MICROSCOPIC DESCRIPTION Diagnosis Skin , left, medial ankle ULCER WITH DENSE VASCULAR PROLIFERATION, INFLAMED, SEE DESCRIPTION Microscopic Description Sections of this biopsy of skin demonstrate ulceration with underlying dense neovascularization with a mixed inflammatory infiltrate. Extravasated red blood cells are seen. There is overlying hyperkeratosis with some parakeratosis. The following stains are performed with appropriate controls reviewed. PAS stain is negative for definitive fungal organisms. A Fite stain is negative for atypical mycobacterium. No bacterial colonies are noted on H&E sections. Comment: The overall features of this biopsy favor ulceration overlying overlying granulation tissue. Given the anatomic location and histopathology, marked stasis dermatitis, ulcerated is also a possibility. Clinical correlation is necessary. Given the clinical history of a wound secondary to being cut by a seashell, mycobacterium marinum or shewanella putrefaciens infection is a possibility; however, the organisms are often very difficult to find in skin biopsies. If this remains a clinical possibility, consideration for culture may be or utility. This case was reviewed by Dr. Fletcher Anon and Dr. Tamala Julian with general agreement. (DJT:gt, 11/23/14) [name] Dermatopathologist, Electronic Signature (Case signed 11/23/2014) Specimen Gross and Clinical Information Clinical History Open wound that will not heal-cut herself on seashell Clinical Diagnosis ? mycobacterium  vs. shewanella bacteria SPECIMEN(S) OBTAINED Skin , left, medial ankle 1 of 2 FINAL for [name], Vanessa [id number]) GROSS DESCRIPTION Formalin fixed specimen received: agg 7 X 4 X 2 MM, TOTO (3 P) (1 B) ( LM ) Stain(s) used in Diagnosis The following stain(s) were used in diagnosing the case: *PAS/F Stain, Fite Acid Fast Stain. The control(s) stained appropriately.      Wound culture (Order [id number])      Wound culture  Status: Finalresult Visible to patient:  MyChart Nextappt: None Dx:  Ulcer of ankle, left, with unspecifie...       Notes Recorded by Constance Goltz, CMA on 12/05/2014 at 3:58 PM Spoke with pt. She is now in [facility] with ID there. They said to put in referral and they would set pt up with an appt Referral placed with all of the info about the ID clinic Notes Recorded by [name], MD on 12/05/2014 at 9:53 AM Call-if she has what I suspect, she will need treatment with 2 medicines for four months and will need to be monitored during this treatment--she needs to see an Infectious disease doctor--preferably one in a medical center [facility] for this Notes Recorded by [name], CMA on 11/28/2014 at 5:44 PM Called and spoke with pt and she stated that she is driving to [place] now and will not be able to see anyone in [place] in the infectious disease area. She stated that she does not have a PCP in [place] and wanted to know what Dr. [name] suggests that she do. She stated that she does not want to start over with having to have more of the procedures done that she has already had done. Pt stated that she could not look at her mychart results at this time since she is driving. Please advise. thanks Notes Recorded by [name], MD on 11/28/2014 at 5:03 PM Tried to call again. I sent the results by my  chart. She had both a culture and a biopsy. Results of the biopsy and the culture did not give [place] an  answer. "The biopsy did not give [place] an answer. 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 Need to see an infectious disease specialist for culture and possible 2 month treatment with special meds" Notes Recorded by [name], CMA on 11/28/2014 at 2:57 PM Dr. [name], pt stated that she did not receive a message from you about any results. Please advise and I can call her back with these. Thank you. Notes Recorded by [name], MD on 11/28/2014 at 8:53 AM Call to be sure she gets message-may be back in [place] by now     45mo ago    Gram Stain Rare   Gram Stain WBC present-predominately PMN   Gram Stain No Squamous Epithelial Cells Seen   Gram Stain Rare Gram Positive Cocci In Pairs   Organism ID, Bacteria Abundant STAPHYLOCOCCUS SPECIES (COAGULASE NEGATIVE)   Resulting Agency [facility]    Narrative     Performed at: [id number]        [place], [place] [address]

## 2015-06-02 NOTE — Telephone Encounter (Signed)
Faxed biopsy, lab report and OV notes to Dr Ander Gaster as instr'd. Notified pt on Mychart.

## 2015-06-06 NOTE — Telephone Encounter (Signed)
ID in [place] asked to see if her bx could have the studies added studies :AFB and fungal stains---call path to see if possible

## 2015-06-07 NOTE — Telephone Encounter (Signed)
Is anyone getting this message???

## 2015-06-08 NOTE — Telephone Encounter (Signed)
Spoke with referrals and they handled the matter.

## 2015-06-14 DIAGNOSIS — R600 Localized edema: Secondary | ICD-10-CM | POA: Diagnosis not present

## 2015-06-14 DIAGNOSIS — L97822 Non-pressure chronic ulcer of other part of left lower leg with fat layer exposed: Secondary | ICD-10-CM | POA: Diagnosis not present

## 2015-06-14 DIAGNOSIS — I87312 Chronic venous hypertension (idiopathic) with ulcer of left lower extremity: Secondary | ICD-10-CM | POA: Diagnosis not present

## 2015-06-21 DIAGNOSIS — I87312 Chronic venous hypertension (idiopathic) with ulcer of left lower extremity: Secondary | ICD-10-CM | POA: Diagnosis not present

## 2015-06-21 DIAGNOSIS — R600 Localized edema: Secondary | ICD-10-CM | POA: Diagnosis not present

## 2015-06-21 DIAGNOSIS — L97822 Non-pressure chronic ulcer of other part of left lower leg with fat layer exposed: Secondary | ICD-10-CM | POA: Diagnosis not present

## 2015-06-28 DIAGNOSIS — R600 Localized edema: Secondary | ICD-10-CM | POA: Diagnosis not present

## 2015-06-28 DIAGNOSIS — I87312 Chronic venous hypertension (idiopathic) with ulcer of left lower extremity: Secondary | ICD-10-CM | POA: Diagnosis not present

## 2015-06-28 DIAGNOSIS — L97822 Non-pressure chronic ulcer of other part of left lower leg with fat layer exposed: Secondary | ICD-10-CM | POA: Diagnosis not present

## 2015-07-05 DIAGNOSIS — L97822 Non-pressure chronic ulcer of other part of left lower leg with fat layer exposed: Secondary | ICD-10-CM | POA: Diagnosis not present

## 2015-07-05 DIAGNOSIS — I87312 Chronic venous hypertension (idiopathic) with ulcer of left lower extremity: Secondary | ICD-10-CM | POA: Diagnosis not present

## 2015-07-05 DIAGNOSIS — R600 Localized edema: Secondary | ICD-10-CM | POA: Diagnosis not present

## 2015-07-17 DIAGNOSIS — R600 Localized edema: Secondary | ICD-10-CM | POA: Diagnosis not present

## 2015-07-17 DIAGNOSIS — I87312 Chronic venous hypertension (idiopathic) with ulcer of left lower extremity: Secondary | ICD-10-CM | POA: Diagnosis not present

## 2015-07-17 DIAGNOSIS — L97822 Non-pressure chronic ulcer of other part of left lower leg with fat layer exposed: Secondary | ICD-10-CM | POA: Diagnosis not present

## 2015-07-26 DIAGNOSIS — R600 Localized edema: Secondary | ICD-10-CM | POA: Diagnosis not present

## 2015-07-26 DIAGNOSIS — L97822 Non-pressure chronic ulcer of other part of left lower leg with fat layer exposed: Secondary | ICD-10-CM | POA: Diagnosis not present

## 2015-07-26 DIAGNOSIS — I87312 Chronic venous hypertension (idiopathic) with ulcer of left lower extremity: Secondary | ICD-10-CM | POA: Diagnosis not present

## 2015-08-03 DIAGNOSIS — I87312 Chronic venous hypertension (idiopathic) with ulcer of left lower extremity: Secondary | ICD-10-CM | POA: Diagnosis not present

## 2015-08-03 DIAGNOSIS — R600 Localized edema: Secondary | ICD-10-CM | POA: Diagnosis not present

## 2015-08-03 DIAGNOSIS — L97822 Non-pressure chronic ulcer of other part of left lower leg with fat layer exposed: Secondary | ICD-10-CM | POA: Diagnosis not present

## 2015-08-23 DIAGNOSIS — R600 Localized edema: Secondary | ICD-10-CM | POA: Diagnosis not present

## 2015-08-23 DIAGNOSIS — I87312 Chronic venous hypertension (idiopathic) with ulcer of left lower extremity: Secondary | ICD-10-CM | POA: Diagnosis not present

## 2015-08-23 DIAGNOSIS — L97822 Non-pressure chronic ulcer of other part of left lower leg with fat layer exposed: Secondary | ICD-10-CM | POA: Diagnosis not present

## 2015-08-30 DIAGNOSIS — L97822 Non-pressure chronic ulcer of other part of left lower leg with fat layer exposed: Secondary | ICD-10-CM | POA: Diagnosis not present

## 2015-08-30 DIAGNOSIS — I87312 Chronic venous hypertension (idiopathic) with ulcer of left lower extremity: Secondary | ICD-10-CM | POA: Diagnosis not present

## 2015-08-30 DIAGNOSIS — R600 Localized edema: Secondary | ICD-10-CM | POA: Diagnosis not present

## 2015-09-01 DIAGNOSIS — L97822 Non-pressure chronic ulcer of other part of left lower leg with fat layer exposed: Secondary | ICD-10-CM | POA: Diagnosis not present

## 2015-09-06 DIAGNOSIS — I87312 Chronic venous hypertension (idiopathic) with ulcer of left lower extremity: Secondary | ICD-10-CM | POA: Diagnosis not present

## 2015-09-06 DIAGNOSIS — L97822 Non-pressure chronic ulcer of other part of left lower leg with fat layer exposed: Secondary | ICD-10-CM | POA: Diagnosis not present

## 2015-09-06 DIAGNOSIS — R600 Localized edema: Secondary | ICD-10-CM | POA: Diagnosis not present

## 2015-09-13 DIAGNOSIS — R600 Localized edema: Secondary | ICD-10-CM | POA: Diagnosis not present

## 2015-09-13 DIAGNOSIS — L97822 Non-pressure chronic ulcer of other part of left lower leg with fat layer exposed: Secondary | ICD-10-CM | POA: Diagnosis not present

## 2015-09-13 DIAGNOSIS — I87312 Chronic venous hypertension (idiopathic) with ulcer of left lower extremity: Secondary | ICD-10-CM | POA: Diagnosis not present

## 2015-09-20 DIAGNOSIS — L97822 Non-pressure chronic ulcer of other part of left lower leg with fat layer exposed: Secondary | ICD-10-CM | POA: Diagnosis not present

## 2015-09-20 DIAGNOSIS — R600 Localized edema: Secondary | ICD-10-CM | POA: Diagnosis not present

## 2015-09-20 DIAGNOSIS — I87312 Chronic venous hypertension (idiopathic) with ulcer of left lower extremity: Secondary | ICD-10-CM | POA: Diagnosis not present

## 2016-03-18 ENCOUNTER — Other Ambulatory Visit: Payer: Self-pay | Admitting: Obstetrics and Gynecology

## 2016-03-18 DIAGNOSIS — Z1231 Encounter for screening mammogram for malignant neoplasm of breast: Secondary | ICD-10-CM

## 2016-04-02 ENCOUNTER — Ambulatory Visit: Payer: BLUE CROSS/BLUE SHIELD

## 2016-04-03 DIAGNOSIS — H52223 Regular astigmatism, bilateral: Secondary | ICD-10-CM | POA: Diagnosis not present

## 2016-04-04 ENCOUNTER — Ambulatory Visit
Admission: RE | Admit: 2016-04-04 | Discharge: 2016-04-04 | Disposition: A | Discharge status: Home / Self Care | Payer: BLUE CROSS/BLUE SHIELD | Source: Ambulatory Visit | Attending: Obstetrics and Gynecology | Admitting: Obstetrics and Gynecology

## 2016-04-04 DIAGNOSIS — Z1231 Encounter for screening mammogram for malignant neoplasm of breast: Secondary | ICD-10-CM

## 2016-04-04 DIAGNOSIS — F5101 Primary insomnia: Secondary | ICD-10-CM | POA: Diagnosis not present

## 2016-04-04 DIAGNOSIS — Z01411 Encounter for gynecological examination (general) (routine) with abnormal findings: Secondary | ICD-10-CM | POA: Diagnosis not present

## 2016-04-04 DIAGNOSIS — N951 Menopausal and female climacteric states: Secondary | ICD-10-CM | POA: Diagnosis not present

## 2016-04-09 ENCOUNTER — Ambulatory Visit: Payer: BLUE CROSS/BLUE SHIELD

## 2016-09-13 DIAGNOSIS — Z8249 Family history of ischemic heart disease and other diseases of the circulatory system: Secondary | ICD-10-CM | POA: Diagnosis not present

## 2016-09-13 DIAGNOSIS — R079 Chest pain, unspecified: Secondary | ICD-10-CM | POA: Diagnosis not present

## 2016-09-13 DIAGNOSIS — R0602 Shortness of breath: Secondary | ICD-10-CM | POA: Diagnosis not present

## 2016-09-21 DIAGNOSIS — M1712 Unilateral primary osteoarthritis, left knee: Secondary | ICD-10-CM | POA: Diagnosis not present

## 2016-11-25 DIAGNOSIS — R12 Heartburn: Secondary | ICD-10-CM | POA: Diagnosis not present

## 2016-11-25 DIAGNOSIS — Z833 Family history of diabetes mellitus: Secondary | ICD-10-CM | POA: Diagnosis not present

## 2016-11-25 DIAGNOSIS — E119 Type 2 diabetes mellitus without complications: Secondary | ICD-10-CM | POA: Diagnosis not present

## 2016-11-25 DIAGNOSIS — E781 Pure hyperglyceridemia: Secondary | ICD-10-CM | POA: Diagnosis not present

## 2017-01-25 DIAGNOSIS — M25512 Pain in left shoulder: Secondary | ICD-10-CM | POA: Diagnosis not present

## 2017-01-25 DIAGNOSIS — M25511 Pain in right shoulder: Secondary | ICD-10-CM | POA: Diagnosis not present

## 2017-02-26 DIAGNOSIS — R21 Rash and other nonspecific skin eruption: Secondary | ICD-10-CM | POA: Diagnosis not present

## 2017-02-26 DIAGNOSIS — E119 Type 2 diabetes mellitus without complications: Secondary | ICD-10-CM | POA: Diagnosis not present

## 2017-05-26 ENCOUNTER — Other Ambulatory Visit: Payer: Self-pay | Admitting: Internal Medicine

## 2017-05-26 DIAGNOSIS — Z1231 Encounter for screening mammogram for malignant neoplasm of breast: Secondary | ICD-10-CM

## 2017-05-28 DIAGNOSIS — Z0000 Encounter for general adult medical examination without abnormal findings: Secondary | ICD-10-CM | POA: Diagnosis not present

## 2017-05-28 DIAGNOSIS — E119 Type 2 diabetes mellitus without complications: Secondary | ICD-10-CM | POA: Diagnosis not present

## 2017-05-28 DIAGNOSIS — Z1231 Encounter for screening mammogram for malignant neoplasm of breast: Secondary | ICD-10-CM | POA: Diagnosis not present

## 2017-05-28 DIAGNOSIS — R319 Hematuria, unspecified: Secondary | ICD-10-CM | POA: Diagnosis not present

## 2017-05-30 ENCOUNTER — Ambulatory Visit
Admission: RE | Admit: 2017-05-30 | Discharge: 2017-05-30 | Disposition: A | Discharge status: Home / Self Care | Payer: BLUE CROSS/BLUE SHIELD | Source: Ambulatory Visit | Attending: Internal Medicine | Admitting: Internal Medicine

## 2017-05-30 DIAGNOSIS — Z1231 Encounter for screening mammogram for malignant neoplasm of breast: Secondary | ICD-10-CM | POA: Diagnosis not present

## 2017-06-02 DIAGNOSIS — E119 Type 2 diabetes mellitus without complications: Secondary | ICD-10-CM | POA: Diagnosis not present

## 2017-06-03 ENCOUNTER — Other Ambulatory Visit: Payer: Self-pay | Admitting: Internal Medicine

## 2017-06-03 DIAGNOSIS — R921 Mammographic calcification found on diagnostic imaging of breast: Secondary | ICD-10-CM

## 2017-06-06 ENCOUNTER — Ambulatory Visit
Admission: RE | Admit: 2017-06-06 | Discharge: 2017-06-06 | Disposition: A | Discharge status: Home / Self Care | Payer: BLUE CROSS/BLUE SHIELD | Source: Ambulatory Visit | Attending: Internal Medicine | Admitting: Internal Medicine

## 2017-06-06 DIAGNOSIS — R921 Mammographic calcification found on diagnostic imaging of breast: Secondary | ICD-10-CM

## 2017-08-11 DIAGNOSIS — R0683 Snoring: Secondary | ICD-10-CM | POA: Diagnosis not present

## 2017-08-11 DIAGNOSIS — G4700 Insomnia, unspecified: Secondary | ICD-10-CM | POA: Diagnosis not present

## 2017-08-11 DIAGNOSIS — E119 Type 2 diabetes mellitus without complications: Secondary | ICD-10-CM | POA: Diagnosis not present

## 2017-09-18 DIAGNOSIS — J0111 Acute recurrent frontal sinusitis: Secondary | ICD-10-CM | POA: Diagnosis not present

## 2017-09-18 DIAGNOSIS — J019 Acute sinusitis, unspecified: Secondary | ICD-10-CM | POA: Diagnosis not present

## 2017-10-15 ENCOUNTER — Institutional Professional Consult (permissible substitution): Payer: BLUE CROSS/BLUE SHIELD | Admitting: Neurology

## 2017-10-21 ENCOUNTER — Encounter: Payer: Self-pay | Admitting: Neurology

## 2017-10-27 ENCOUNTER — Encounter: Payer: Self-pay | Admitting: Neurology

## 2017-10-27 ENCOUNTER — Ambulatory Visit (INDEPENDENT_AMBULATORY_CARE_PROVIDER_SITE_OTHER): Payer: BLUE CROSS/BLUE SHIELD | Admitting: Neurology

## 2017-10-27 VITALS — BP 134/90 | HR 76 | Ht 66.0 in | Wt 180.0 lb

## 2017-10-27 DIAGNOSIS — G4726 Circadian rhythm sleep disorder, shift work type: Secondary | ICD-10-CM | POA: Diagnosis not present

## 2017-10-27 DIAGNOSIS — E119 Type 2 diabetes mellitus without complications: Secondary | ICD-10-CM | POA: Diagnosis not present

## 2017-10-27 DIAGNOSIS — G4700 Insomnia, unspecified: Secondary | ICD-10-CM | POA: Insufficient documentation

## 2017-10-27 DIAGNOSIS — R0683 Snoring: Secondary | ICD-10-CM

## 2017-10-27 NOTE — Progress Notes (Signed)
SLEEP MEDICINE CLINIC   Provider:  Larey Seat, M.D.   Primary Care Physician:  [name]  Referring Provider: [name], FNP    Chief Complaint  Patient presents with  . Referral    Referral from [facility] for fatigue, and problems sleeping  no prior sleep study room1 11 pt with    HPI:  Vanessa Allen is a 52 y.o. female patient , seen here on 10-27-2017  in a referral from [place] for cyclic insomnia, problems to initiate and maintain sleep. Chief complaint according to patient : " I cant sleep ' - this started 5 years or more ago-no medication, medical condition, trauma or surgery started this".    At the pleasure of meeting Vanessa Allen today, who has had problems to initiate and maintain sleep for several years now without being aware of a trigger for this development.  She is not excessively daytime sleepy, on the contrary she could not nap when she wanted to.  She endorsed the Epworth Sleepiness Scale at 0 points, the fatigue severity however at 49 out of 63 points.  She has undergone a cholecystectomy, a myomectomy and has been diagnosed with diabetes and tendinitis, she frequently has sinus infections, and she is a shift Insurance underwriter.  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   Sleep habits are as follows: She does not have a set dinnertime, but usually cannot eat at work, as she works in the emergency room.  This very busy workplace does not allow for regular breaks.  She commutes about 35 minutes from any pending hospital in [place] to [place].  Dinner not cannot be before 8 unless she grabs fast food, which happens a lot of times.  She will be home after 830, takes a shower, watching TV.  Sometimes she will wake up hourly sometimes she will not go to sleep at all.  She estimates that on average she is not asleep before 11 PM.  She keeps her bedroom cool, quiet and dark, if the bedroom is not dark enough she will use a  sleep mask.  She does like to have the TV on in the background but uses a sleep mask to eliminate screen night.  She tosses and turns a lot, she cannot remember dreaming.  People around her have told her that she snores.  If she wakes up during the night she switches the TV off.  She does not report nocturia, headaches, dizziness or nausea.  No major discomfort. She sets her alarm for 5.30 AM and on a good night may have had 6 hours of sleep, but often less.  On non work days her bedtime is not set- she may go to bed at 10- but often doesn't sleep.  Sleep medical history and family sleep history:  Youngest of 6 siblings, there were 3 sons and 3 daughters.  Older brother has OSA on CPAP. One older sister has insomnia. No history of ENT surgery, TBI or chronic pain conditions, depression, anxiety.     Social history:  Shift work in ED, single, no children. No pets.  caffeine- none, neither coffee, tea, soda. ETOH seldomly- 1 glass wine once a month. No tobacco use, no- vaping.       Review of Systems: I can't sleep  Out of a complete 14 system review, the patient complains of only the following symptoms, and all other reviewed systems are negative.  Snoring  Sleep hygiene- has tried audio books, relaxation tapes, likes the same TV  show over and over. Dim lights, hot shower, eye mask on- ritual. Failed medication Benadryl OTC, melatonin, valerian.   Used to take Ambien 10 mg, xanax prn. 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 Marital status: Single    Spouse name: Not on file  . Number of children: Not on file  . Years of education: Not on file  . Highest education level: Not on file  Occupational History  . Not on file  Social Needs  . Financial resource strain: Not on file  . Food insecurity:    Worry: Not on file    Inability: Not on file  . Transportation  needs:    Medical: Not on file    Non-medical: Not on file  Tobacco Use  . Smoking status: Never Smoker  . Smokeless tobacco: Never Used  Substance and Sexual Activity  . Alcohol use: Yes    Alcohol/week: 0.0 standard drinks    Comment: occasional  . Drug use: No  . Sexual activity: Not on file  Lifestyle  . Physical activity:    Days per week: Not on file    Minutes per session: Not on file  . Stress: Not on file  Relationships  . Social connections:    Talks on phone: Not on file    Gets together: Not on file    Attends religious service: Not on file    Active member of club or organization: Not on file    Attends meetings of clubs or organizations: Not on file    Relationship status: Not on file  . Intimate partner violence:    Fear of current or ex partner: Not on file    Emotionally abused: Not on file    Physically abused: Not on file    Forced sexual activity: Not on file  Other Topics Concern  . Not on file  Social History Narrative  . Not on file    Family History  Problem Relation Age of Onset  . Hyperlipidemia Mother   . Hypertension Mother   . Cancer Father   . Diabetes Father   . Heart disease Father   . Hypertension Brother   . Hypertension Daughter   . Breast cancer Neg Hx     Past Medical History:  Diagnosis Date  . Diabetes mellitus without complication [facility])     Past Surgical History:  Procedure Laterality Date  . APPENDECTOMY    . CHOLECYSTECTOMY    . MYOMECTOMY      Current Outpatient Medications  Medication Sig Dispense Refill  . baclofen (LIORESAL) 10 MG tablet as needed.   0  . dapagliflozin propanediol (FARXIGA) 5 MG TABS tablet Take 5 mg by mouth daily.    Marland Kitchen esomeprazole (NEXIUM) 20 MG capsule Take 20 mg by mouth daily at 12 noon.    . fluticasone (FLONASE) 50 MCG/ACT nasal spray as needed.   0  . glucose blood (ACCU-CHEK GUIDE) test strip 1 each by Other route as needed for other. Use as instructed    . Glucose Blood  (GLUCOSE METER TEST VI) by In Vitro route 2 (two) times daily.    . metFORMIN (GLUCOPHAGE) 500 MG tablet Take by mouth daily with breakfast.     No current facility-administered medications for this visit.  Allergies as of 10/27/2017  . (No Known Allergies)    Vitals: BP 134/90 (BP Location: Right Arm, Patient Position: Sitting, Cuff Size: Normal)   Pulse 76   Ht 5\' 6"  (1.676 m)   Wt 180 lb (81.6 kg)   BMI 29.05 kg/m  Last Weight:   Wt Readings from Last 1 Encounters:  10/27/17 180 lb (81.6 kg)   JYN:WGNF mass index is 29.05 kg/m.     Last Height:   Ht Readings from Last 1 Encounters:  10/27/17 5\' 6"  (1.676 m)    Physical exam:  General: The patient is awake, alert and appears not in acute distress. The patient is well groomed. Head: Normocephalic, atraumatic. Neck is supple. Mallampati 3,  neck circumference: 15". Nasal airflow patent , Retrognathia is seen.  Cardiovascular:  Regular rate and rhythm , without  murmurs or carotid bruit, and without distended neck veins. Respiratory: Lungs are clear to auscultation. Skin:  Without evidence of edema, or rash Trunk: BMI is 29. The patient's posture is erect. Neurologic exam : The patient is awake and alert, oriented to place and time.   Memory subjective  described as intact.   Attention span & concentration ability appears normal.  Speech is fluent,  without dysarthria, dysphonia or aphasia.  Mood and affect are appropriate.  Cranial nerves: Pupils are equal and briskly reactive to light. Extraocular movements  in vertical and horizontal planes intact and without nystagmus. Visual fields by finger perimetry are intact.Hearing to finger rub intact.  Facial sensation intact to fine touch. Facial motor strength is symmetric and tongue and uvula move midline. Shoulder shrug was symmetrical.   Motor exam:  Normal tone, muscle bulk and symmetric strength in all extremities. Sensory:  Fine touch, pinprick and vibration were  tested in all extremities. Proprioception tested in the upper extremities was normal. Coordination:  Finger-to-nose maneuver was normal without evidence of ataxia, dysmetria or tremor. Gait and station: Patient walks without assistive device and is able unassisted to climb up to the exam table. Strength within normal limits.  Stance is stable and normal. Tandem gait is unfragmented. Turns with 3 Steps. Deep tendon reflexes: in the  upper and lower extremities are symmetric and intact.   Assessment:  After physical and neurologic examination, review of laboratory studies,  Personal review of imaging studies, reports of other /same  Imaging studies, results of polysomnography and / or neurophysiology testing and pre-existing records as far as provided in visit., my assessment is   1)  Vanessa Allen has developed insomnia in a slow progressive pattern. This may have been present as long as 8 years now.  She is not sleepy in daytime, but fatigued - reports neither headaches , nor aches or soreness  2) Psychigenic causes are not identified. Her sleep habits are pretty good. She slept pretty well in a hotel during a trip in August. She was then told that she snores. Let's make sure she doesn't have sleep apnea. There is after all a family history of OSA.   The patient was advised of the nature of the diagnosed disorder , the treatment options and the  risks for general health and wellness arising from not treating the condition.   I spent more than 40 minutes of face to face time with the patient. She has used a fit bit watch - and she doubts its results, she feels strongly that she hasn't slept when the smart device tells her she did. Not a  HSTcondidate due to  this.   Greater than 50% of time was spent in counseling and coordination of care. We have discussed the diagnosis and differential and I answered the patient's questions.    Plan:  Treatment plan and additional workup : attended sleep study here at  [place].    Larey Seat, MD 05/05/6806, 81:10 PM  Certified in Neurology by ABPN Certified in [place] by [facility] [address], [place], [place] [address]

## 2017-10-27 NOTE — Patient Instructions (Signed)
Sleep Study  Vanessa Allen .  You will start your sleep study either at 8 or 9 Pm and leave next morning between 5:00 a.m - 6:00 a.m.  The sleep study consists of a recording of your brain waves (EEG). Breathing, heart rate and rhythm (ECG), oxygen level, eye movement, and leg movement.  The technician will glue or or paste several electrodes to your scalp, face, chest and legs.  You will have belts around your chest and abdomen to record breathing and a finger clasp to check blood oxygen levels.  A tube at your mouth and nose will detect airflow.  There are no needle sticks or painful procedures of any sort.  You will have your own room, and we will make every effort to attend to your comfort and privacy.  Please prepare for your study by the following steps:   Please avoid coffee, tea, soda, chocolate and other caffeine foods or beverages after 12:00 noon on the day of your sleep study.   You must arrive with clean (no oils), conditioners or make up, and please make sure that you wash your hair to ensure that your hair and scalp are clean, dry and free of any hair extensions on the day of your study.  This will help to get a good reading of study.  Please try not to nap on the day of your study.  Please bring a list of all your medications.  Bring any medications that you might need during the time you are within the laboratory, including insulin, sleeping pills, pain medication and anxiety medications.  Bring snacks, water or juice  Please bring clothes to sleep in and your normal overnight bag.  Please leave valuable at home, as we will not be responsible for any lost items.  If you have any further questions, please feel free to call our office at [phone]. Thank you, Larey Seat, MD

## 2017-11-05 ENCOUNTER — Telehealth: Payer: Self-pay | Admitting: Neurology

## 2017-11-05 NOTE — Telephone Encounter (Signed)
Pt left a message stating she had not heard from the sleep lab regarding her sleep study. There is no order in for this pt to have a sleep study. Can you please put in a order for what Dr. Brett Fairy wants the pt to have. Thank you.

## 2017-11-06 ENCOUNTER — Other Ambulatory Visit: Payer: Self-pay | Admitting: Neurology

## 2017-11-06 DIAGNOSIS — G4726 Circadian rhythm sleep disorder, shift work type: Secondary | ICD-10-CM

## 2017-11-06 DIAGNOSIS — R0683 Snoring: Secondary | ICD-10-CM

## 2017-11-06 DIAGNOSIS — E119 Type 2 diabetes mellitus without complications: Secondary | ICD-10-CM

## 2017-11-06 DIAGNOSIS — G4700 Insomnia, unspecified: Secondary | ICD-10-CM

## 2017-11-06 NOTE — Telephone Encounter (Signed)
Placed order for in lab and HST. Dr Brett Fairy wrote in note she would prefer in lab study.

## 2017-11-10 ENCOUNTER — Other Ambulatory Visit: Payer: Self-pay | Admitting: Nurse Practitioner

## 2017-11-10 DIAGNOSIS — G4700 Insomnia, unspecified: Secondary | ICD-10-CM

## 2017-11-10 MED ORDER — TRAZODONE HCL 50 MG PO TABS: 50.0000 mg | ORAL_TABLET | Freq: Every day | ORAL | 1 refills | Status: DC

## 2017-11-12 ENCOUNTER — Other Ambulatory Visit: Payer: Self-pay

## 2017-11-12 DIAGNOSIS — G4700 Insomnia, unspecified: Secondary | ICD-10-CM

## 2017-11-12 MED ORDER — TRAZODONE HCL 50 MG PO TABS: 50.0000 mg | ORAL_TABLET | Freq: Every day | ORAL | 1 refills | Status: DC

## 2017-11-24 DIAGNOSIS — Z9049 Acquired absence of other specified parts of digestive tract: Secondary | ICD-10-CM | POA: Diagnosis not present

## 2017-11-24 DIAGNOSIS — Z79899 Other long term (current) drug therapy: Secondary | ICD-10-CM | POA: Diagnosis not present

## 2017-11-24 DIAGNOSIS — R160 Hepatomegaly, not elsewhere classified: Secondary | ICD-10-CM | POA: Diagnosis not present

## 2017-11-24 DIAGNOSIS — E119 Type 2 diabetes mellitus without complications: Secondary | ICD-10-CM | POA: Diagnosis not present

## 2017-11-24 DIAGNOSIS — R109 Unspecified abdominal pain: Secondary | ICD-10-CM | POA: Diagnosis not present

## 2017-11-26 ENCOUNTER — Telehealth: Payer: Self-pay

## 2017-11-26 NOTE — Telephone Encounter (Signed)
Hello Vanessa Allen I received abnormal non contrast CT results from the ER today shows a mass on my liver requiring follow up with contrast CT or ultrasound. I am flying home tomorrow to have whatever necessary follow up studies i need to have done. Was hoping to have CT or ultrasound ordered so I can possibly have it done on Wednesday. I will bring the results with me. Vanessa Allen

## 2017-11-26 NOTE — Telephone Encounter (Signed)
Spoke with pt she stated she will try to fax it over to you if not she will attempt to bring the records in she stated she has been traveling a lot and is jet lagged so she will try her best to get it to [place]. YRL,RMA

## 2017-11-26 NOTE — Telephone Encounter (Signed)
Are you able to have your records faxed to [place] so I can have supportive information?

## 2017-12-01 ENCOUNTER — Ambulatory Visit (INDEPENDENT_AMBULATORY_CARE_PROVIDER_SITE_OTHER): Payer: BLUE CROSS/BLUE SHIELD | Admitting: Nurse Practitioner

## 2017-12-01 ENCOUNTER — Encounter: Payer: Self-pay | Admitting: Nurse Practitioner

## 2017-12-01 VITALS — BP 126/80 | HR 84 | Temp 98.7°F | Ht 66.25 in | Wt 177.4 lb

## 2017-12-01 DIAGNOSIS — R1011 Right upper quadrant pain: Secondary | ICD-10-CM | POA: Diagnosis not present

## 2017-12-01 DIAGNOSIS — R160 Hepatomegaly, not elsewhere classified: Secondary | ICD-10-CM | POA: Diagnosis not present

## 2017-12-01 DIAGNOSIS — Z09 Encounter for follow-up examination after completed treatment for conditions other than malignant neoplasm: Secondary | ICD-10-CM

## 2017-12-01 DIAGNOSIS — E119 Type 2 diabetes mellitus without complications: Secondary | ICD-10-CM | POA: Diagnosis not present

## 2017-12-01 LAB — HEMOGLOBIN A1C
Est. average glucose Bld gHb Est-mCnc: 146 mg/dL
Hgb A1c MFr Bld: 6.7 % — ABNORMAL HIGH (ref 4.8–5.6)

## 2017-12-01 MED ORDER — FREESTYLE LIBRE 14 DAY READER DEVI: 1.0000 | Freq: Three times a day (TID) | 0 refills | Status: DC

## 2017-12-01 MED ORDER — FREESTYLE LIBRE 14 DAY SENSOR MISC: 1.0000 | Freq: Three times a day (TID) | 11 refills | Status: DC

## 2017-12-01 NOTE — Progress Notes (Signed)
Subjective:     Patient ID: Weston Brass , female    DOB: Jan 27, 1966 , 52 y.o.   MRN: [id number]   Chief Complaint  Patient presents with  . ER F/U    pt states she had an CT scan done and was told she has a mass on her liver and needs a CT contrast or Ultrasound to be ordered.    HPI  Incidental finding of small amount of blood in urine and obtained a CT abdomen /pelvis w/o contrast which showed mass on liver 4.5 x 4.3 cm. Radiologist recommends a CT with contrast or ultrasound.    She has seen a GI doctor in the past many years ago. She is taking   Abdominal Pain  This is a new problem. The current episode started 1 to 4 weeks ago. The onset quality is gradual. The problem has been gradually improving. The pain is located in the RUQ. The quality of the pain is dull. Pertinent negatives include no constipation or nausea. The pain is aggravated by eating. She has tried nothing (she had 30 toradol and IV tylenol at the time at the ER.  no further medications. ) for the symptoms. Prior diagnostic workup includes CT scan.     Past Medical History:  Diagnosis Date  . Diabetes mellitus without complication ([facility])      Family History  Problem Relation Age of Onset  . Hyperlipidemia Mother   . Hypertension Mother   . Cancer Father   . Diabetes Father   . Heart disease Father   . Hypertension Brother   . Hypertension Daughter   . Breast cancer Neg Hx      Current Outpatient Medications:  .  baclofen (LIORESAL) 10 MG tablet, as needed. , Disp: , Rfl: 0 .  dapagliflozin propanediol (FARXIGA) 5 MG TABS tablet, Take 5 mg by mouth daily., Disp: , Rfl:  .  esomeprazole (NEXIUM) 20 MG capsule, Take 20 mg by mouth daily at 12 noon., Disp: , Rfl:  .  glucose blood (ACCU-CHEK GUIDE) test strip, 1 each by Other route as needed for other. Use as instructed, Disp: , Rfl:  .  Glucose Blood (GLUCOSE METER TEST VI), by In Vitro route 2 (two) times daily., Disp: , Rfl:  .  metFORMIN  (GLUCOPHAGE) 500 MG tablet, Take by mouth daily with breakfast., Disp: , Rfl:  .  traZODone (DESYREL) 50 MG tablet, Take 1 tablet (50 mg total) by mouth at bedtime., Disp: 30 tablet, Rfl: 1 .  fluticasone (FLONASE) 50 MCG/ACT nasal spray, as needed. , Disp: , Rfl: 0   No Known Allergies   Review of Systems  HENT: Negative.   Respiratory: Negative.   Cardiovascular: Negative.   Gastrointestinal: Positive for abdominal pain. Negative for abdominal distention, constipation and nausea.  Genitourinary: Negative for pelvic pain.  Musculoskeletal: Negative.   Skin: Negative.   Neurological: Negative.      Today's Vitals   12/01/17 1513  BP: 126/80  Pulse: 84  Temp: 98.7 F (37.1 C)  TempSrc: Oral  SpO2: 98%  Weight: 177 lb 6.4 oz (80.5 kg)  Height: 5' 6.25" (1.683 m)  PainSc: 0-No pain   Body mass index is 28.42 kg/m.   Objective:  Physical Exam  Constitutional: She is oriented to person, place, and time. She appears well-developed and well-nourished.  Cardiovascular: Normal rate.  Pulmonary/Chest: Effort normal and breath sounds normal.  Abdominal: Soft. Bowel sounds are normal. She exhibits no distension and no mass. There  is no tenderness. There is no rebound. No hernia.  Neurological: She is alert and oriented to person, place, and time.  Skin: Capillary refill takes less than 2 seconds.        Assessment And Plan:     1. Right upper quadrant abdominal pain  She had a CT without contrast of abdomen and pelvis on 11/24/17 after having gradual abdominal pain and moderate blood in urine. Negative for kidney stone however had incidental finding of mass to liver measuring 4 x 4 cm. Recommended by radiologist to have a CT scan with contrast.    - CT Abdomen Pelvis W Contrast; Future  2. Type 2 diabetes mellitus without complication, without long-term current use of insulin (HCC)  Chronic, controlled  Continue with current medications - Hemoglobin A1c - Continuous Blood  Gluc Receiver (FREESTYLE LIBRE 14 DAY READER) DEVI; 1 each by Does not apply route 4 (four) times daily -  before meals and at bedtime.  Dispense: 1 Device; Refill: 0 - Continuous Blood Gluc Sensor (FREESTYLE LIBRE 14 DAY SENSOR) MISC; 1 each by Does not apply route 4 (four) times daily -  before meals and at bedtime.  Dispense: 2 each; Refill: [place], FNP

## 2017-12-05 ENCOUNTER — Ambulatory Visit
Admission: RE | Admit: 2017-12-05 | Discharge: 2017-12-05 | Disposition: A | Discharge status: Home / Self Care | Payer: BLUE CROSS/BLUE SHIELD | Source: Ambulatory Visit | Attending: Nurse Practitioner | Admitting: Nurse Practitioner

## 2017-12-05 ENCOUNTER — Other Ambulatory Visit: Payer: Self-pay | Admitting: Nurse Practitioner

## 2017-12-05 DIAGNOSIS — R1900 Intra-abdominal and pelvic swelling, mass and lump, unspecified site: Secondary | ICD-10-CM

## 2017-12-05 DIAGNOSIS — R1011 Right upper quadrant pain: Secondary | ICD-10-CM

## 2017-12-08 ENCOUNTER — Encounter (INDEPENDENT_AMBULATORY_CARE_PROVIDER_SITE_OTHER): Payer: BLUE CROSS/BLUE SHIELD | Admitting: Neurology

## 2017-12-08 DIAGNOSIS — R0683 Snoring: Secondary | ICD-10-CM

## 2017-12-08 DIAGNOSIS — G4710 Hypersomnia, unspecified: Secondary | ICD-10-CM | POA: Diagnosis not present

## 2017-12-08 DIAGNOSIS — G4726 Circadian rhythm sleep disorder, shift work type: Secondary | ICD-10-CM

## 2017-12-08 DIAGNOSIS — E119 Type 2 diabetes mellitus without complications: Secondary | ICD-10-CM

## 2017-12-08 DIAGNOSIS — G4700 Insomnia, unspecified: Secondary | ICD-10-CM

## 2017-12-09 ENCOUNTER — Ambulatory Visit
Admission: RE | Admit: 2017-12-09 | Discharge: 2017-12-09 | Disposition: A | Discharge status: Home / Self Care | Payer: BLUE CROSS/BLUE SHIELD | Source: Ambulatory Visit | Attending: Nurse Practitioner | Admitting: Nurse Practitioner

## 2017-12-09 DIAGNOSIS — R1900 Intra-abdominal and pelvic swelling, mass and lump, unspecified site: Secondary | ICD-10-CM

## 2017-12-09 DIAGNOSIS — K7689 Other specified diseases of liver: Secondary | ICD-10-CM | POA: Diagnosis not present

## 2017-12-09 IMAGING — CT CT ABDOMEN WO/W CM
2 of 12 series · 12 of 46 positions shown, 18 images · IV contrast (iopamidol)
Comparison: CT AP [DATE] and report from [DATE]

CLINICAL DATA: Followup liver mass.

EXAM:
CT ABDOMEN WITHOUT AND WITH CONTRAST
TECHNIQUE: Multidetector CT imaging of the abdomen was performed following the
standard protocol before and following the bolus administration of
intravenous contrast.
CONTRAST:  100mL [SN] IOPAMIDOL ([SN]) INJECTION 61%
Creatinine was obtained on site at [HOSPITAL] at [REDACTED].
Results: Creatinine 0.8 mg/dL.

[Series 6: arterial phase liver 2.00 br40 s3 cor · coronal · arterial · 0.54mm/px · 3 of 141 slices shown]
[im 36/141  soft-tissue]
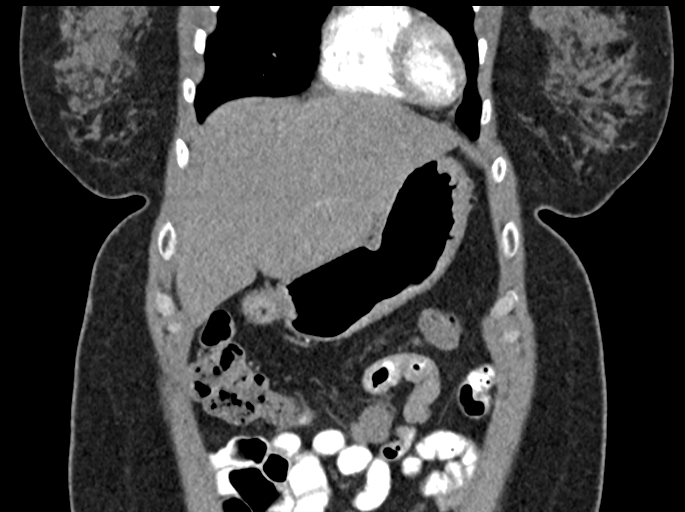
[im 71/141  soft-tissue]
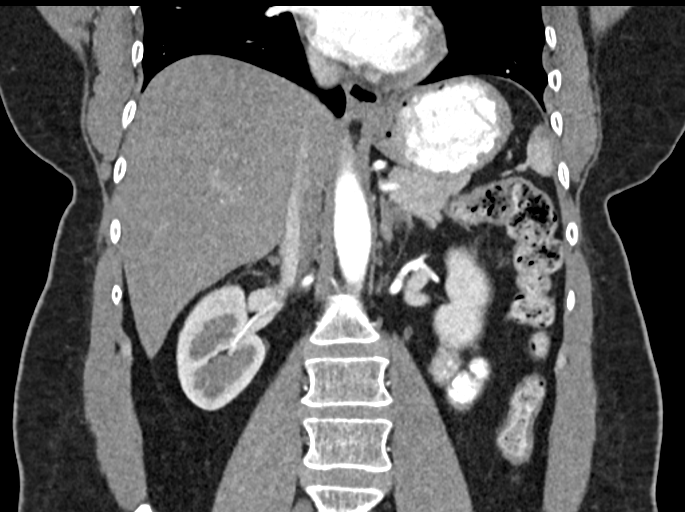
[im 106/141  soft-tissue]
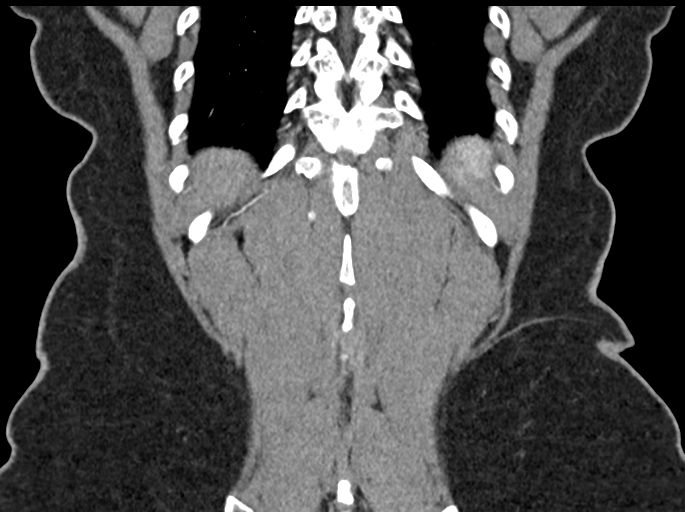

[Series 21: venous phase liver 1.00 br40 s3 · axial · portal-venous · 0.72mm/px · z∈[+1527,+1754]mm · 9 of 279 slices shown, 15 images]
[im 26/279  soft-tissue]
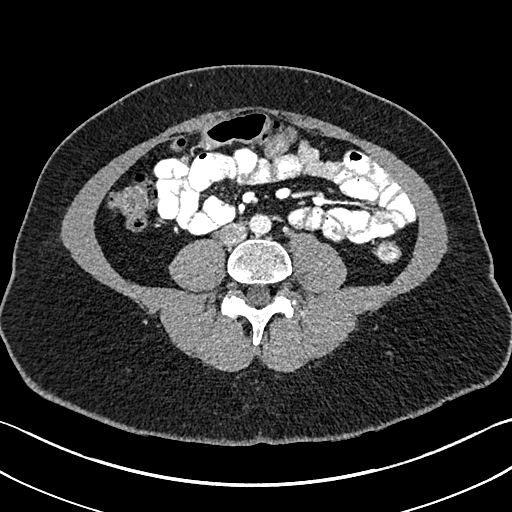
[im 26/279  bone]
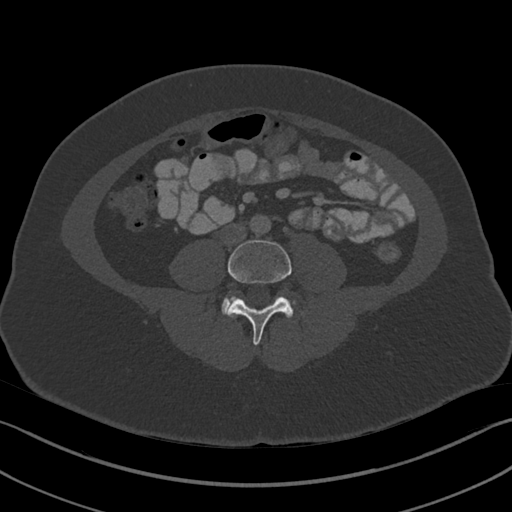
[im 51/279  soft-tissue]
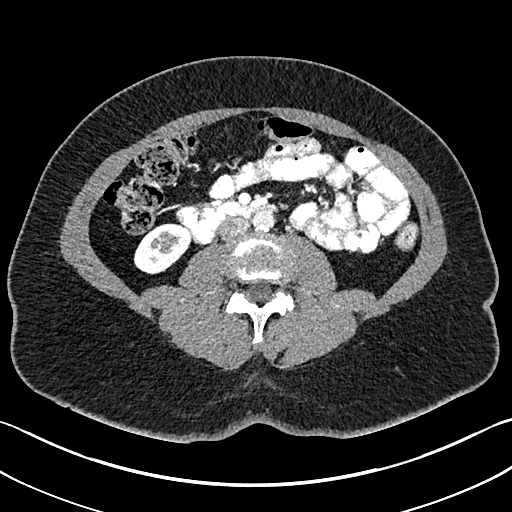
[im 76/279  soft-tissue]
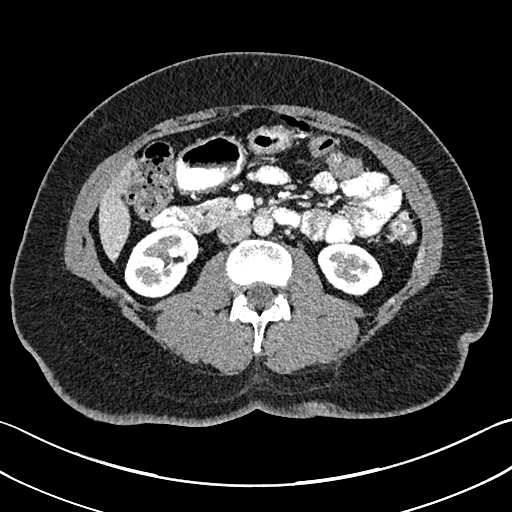
[im 102/279  soft-tissue]
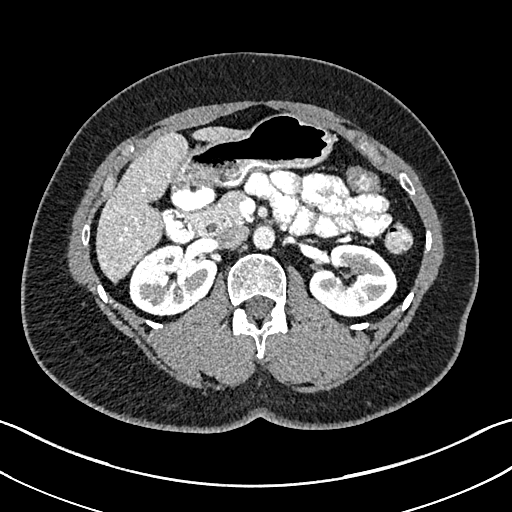
[im 152/279  soft-tissue]
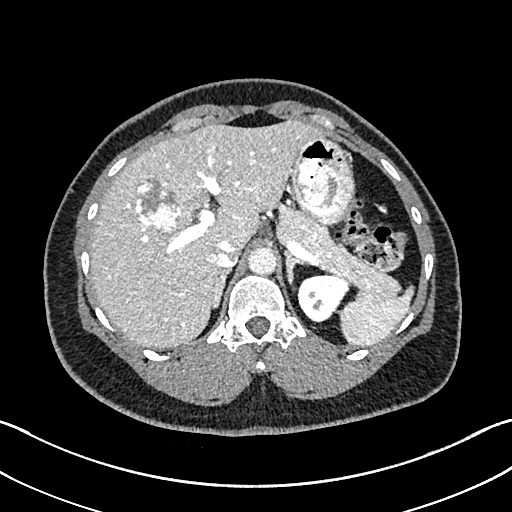
[im 177/279  soft-tissue]
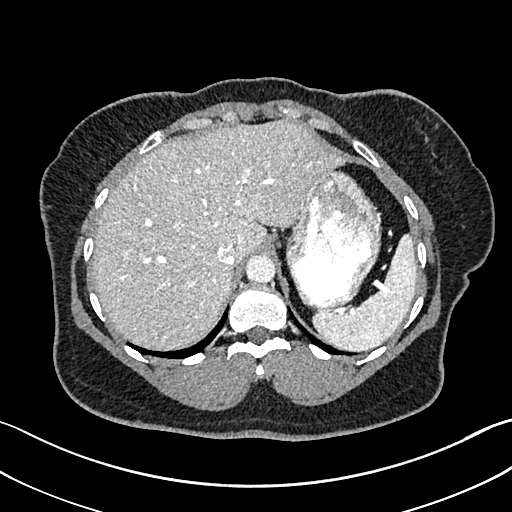
[im 177/279  lung]
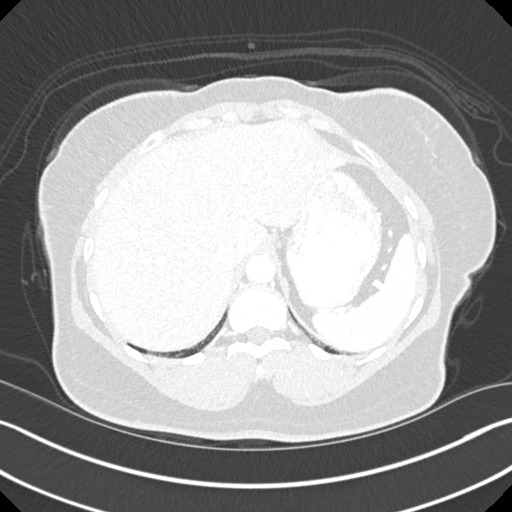
[im 203/279  soft-tissue]
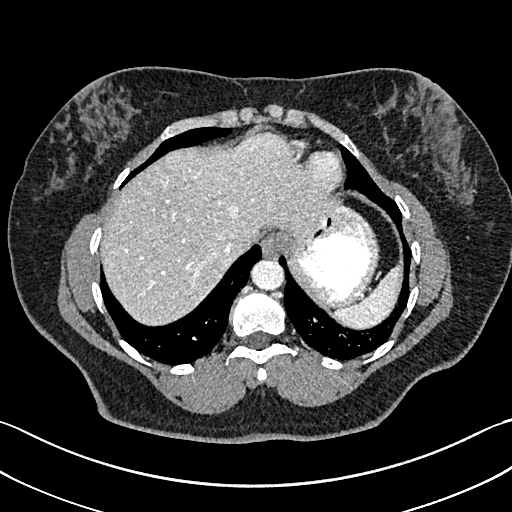
[im 203/279  lung]
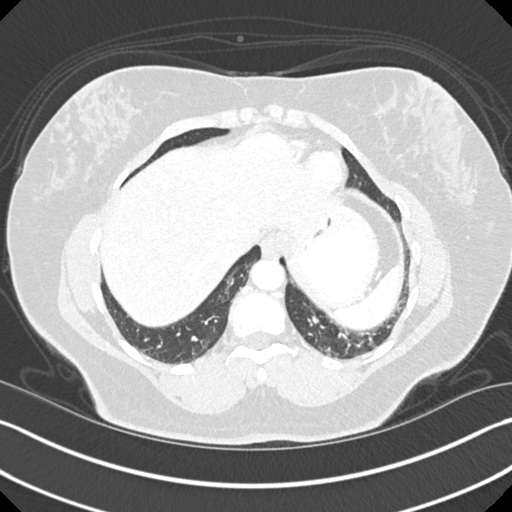
[im 228/279  soft-tissue]
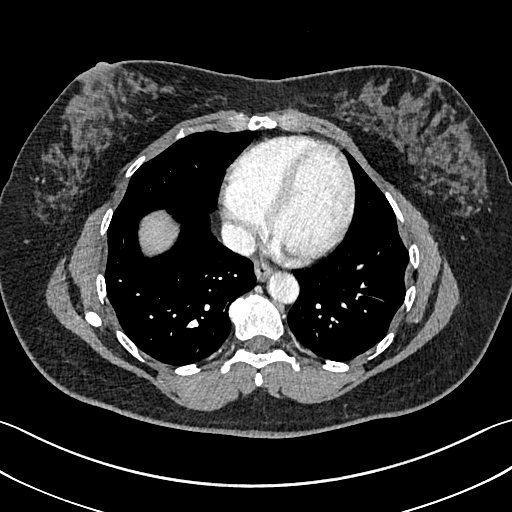
[im 228/279  lung]
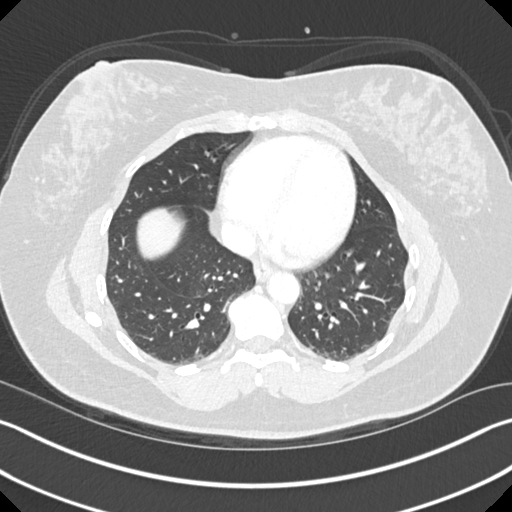
[im 253/279  soft-tissue]
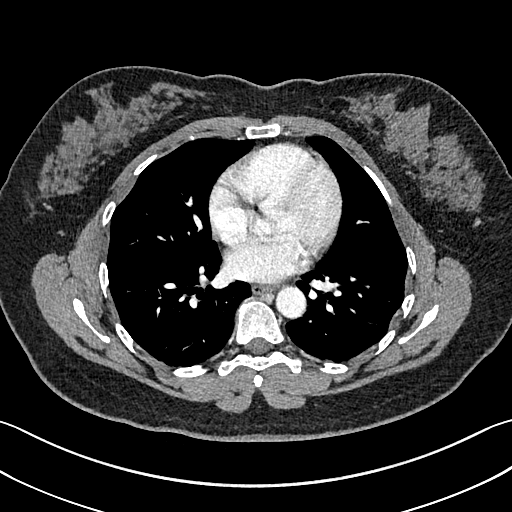
[im 253/279  lung]
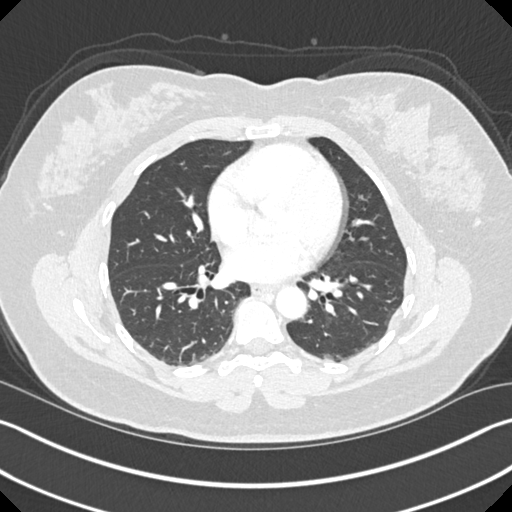
[im 253/279  bone]
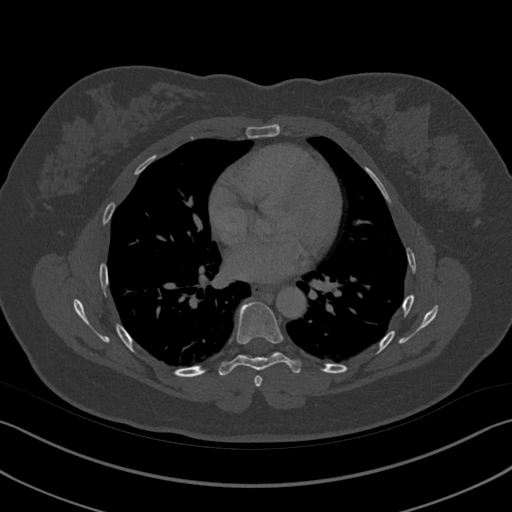

[12 of 46 positions shown; findings below may reference images not displayed]

FINDINGS: Lower chest: No acute abnormality.

Hepatobiliary: Status post cholecystectomy. Mild increase caliber of
the common bile duct which measures up to 7 mm. Enhancing mass
within segment 8 measures 5.7 cm, image 24/24. On [DATE] this
measured 1.2 cm. There is early nodular peripheral enhancement with
delayed fill-in compatible with a benign hemangioma. No additional
focal liver abnormalities identified. Within segment 6 there is a
1.5 cm low-attenuation focus, image 166/21. Unchanged from
[DATE] and also likely representing benign hemangioma.

Pancreas: Unremarkable. No pancreatic ductal dilatation or
surrounding inflammatory changes.

Spleen: Normal in size without focal abnormality.

Adrenals/Urinary Tract: Adrenal glands are unremarkable. Kidneys are
normal, without renal calculi, focal lesion, or hydronephrosis.

Stomach/Bowel: Stomach is within normal limits. No evidence of bowel
wall thickening, distention, or inflammatory changes.

Vascular/Lymphatic: Mild aortic atherosclerosis. No aneurysm. No
abdominopelvic adenopathy.

Other: Small periumbilical hernia contains fat only.

Musculoskeletal: No acute or significant osseous findings.
IMPRESSION: 1. The mass within right lobe of liver has enhancement
characteristics compatible with a benign hemangioma. This has
increased in size when compared with [DATE]. Smaller segment 6
hemangioma also noted, similar to previous exam.
2. Previous cholecystectomy.
3.  Aortic Atherosclerosis ([SN]-[SN]).

## 2017-12-09 MED ORDER — IOPAMIDOL (ISOVUE-300) INJECTION 61%
100.0000 mL | Freq: Once | INTRAVENOUS | Status: AC | PRN
  Administered 2017-12-09: 100 mL via INTRAVENOUS

## 2017-12-10 ENCOUNTER — Encounter: Payer: Self-pay | Admitting: Nurse Practitioner

## 2017-12-11 ENCOUNTER — Encounter: Payer: Self-pay | Admitting: Nurse Practitioner

## 2017-12-11 ENCOUNTER — Other Ambulatory Visit: Payer: Self-pay | Admitting: Nurse Practitioner

## 2017-12-11 ENCOUNTER — Telehealth: Payer: Self-pay | Admitting: Neurology

## 2017-12-11 DIAGNOSIS — Z1211 Encounter for screening for malignant neoplasm of colon: Secondary | ICD-10-CM

## 2017-12-11 NOTE — Telephone Encounter (Signed)
Called the patient, her sleep study was just completed on Monday 11/11. Informed the patient that it can take 10-14 days to hear back with results as Dr Brett Fairy will have to read them. Pt was hoping to get the results before she heads out of town. Advised the patient I would let Dr Brett Fairy know she was calling and see if she can look at hers soon. Informed her I will call with the results once I have them and we will go by what she recommends. Pt verbalized understanding.

## 2017-12-11 NOTE — Progress Notes (Signed)
re

## 2017-12-11 NOTE — Telephone Encounter (Signed)
Patient called and stated that she is awaiting results of at home sleep study and no one has called her. Please call and advise.

## 2017-12-12 ENCOUNTER — Other Ambulatory Visit: Payer: Self-pay | Admitting: Nurse Practitioner

## 2017-12-12 ENCOUNTER — Encounter: Payer: Self-pay | Admitting: Nurse Practitioner

## 2017-12-12 DIAGNOSIS — D1800 Hemangioma unspecified site: Secondary | ICD-10-CM

## 2017-12-12 NOTE — Telephone Encounter (Signed)
Hi Vanessa Allen,  Data of HST 11-11/ 12-2017 were downloaded but were not transferred into a report. This is the summary _ Her AHI was less than 2/h and there was no additional snoring, no oxygen desaturation noted.   No organic reason for Insomnia, hypersomnia or arousals identified. Jeb Levering, MD

## 2017-12-15 ENCOUNTER — Encounter: Payer: Self-pay | Admitting: Nurse Practitioner

## 2017-12-15 ENCOUNTER — Encounter: Payer: Self-pay | Admitting: Neurology

## 2017-12-15 NOTE — Telephone Encounter (Signed)
Responded to the patient through my chart.

## 2017-12-16 DIAGNOSIS — Z1211 Encounter for screening for malignant neoplasm of colon: Secondary | ICD-10-CM | POA: Diagnosis not present

## 2017-12-16 DIAGNOSIS — R140 Abdominal distension (gaseous): Secondary | ICD-10-CM | POA: Diagnosis not present

## 2017-12-16 DIAGNOSIS — K219 Gastro-esophageal reflux disease without esophagitis: Secondary | ICD-10-CM | POA: Diagnosis not present

## 2017-12-18 ENCOUNTER — Other Ambulatory Visit: Payer: Self-pay | Admitting: Nurse Practitioner

## 2017-12-18 ENCOUNTER — Other Ambulatory Visit: Payer: Self-pay | Admitting: Neurology

## 2017-12-18 DIAGNOSIS — R0683 Snoring: Secondary | ICD-10-CM

## 2017-12-18 DIAGNOSIS — G4700 Insomnia, unspecified: Secondary | ICD-10-CM

## 2017-12-18 DIAGNOSIS — G4726 Circadian rhythm sleep disorder, shift work type: Secondary | ICD-10-CM

## 2017-12-18 DIAGNOSIS — R5382 Chronic fatigue, unspecified: Secondary | ICD-10-CM

## 2017-12-18 DIAGNOSIS — E119 Type 2 diabetes mellitus without complications: Secondary | ICD-10-CM

## 2017-12-18 MED ORDER — TEMAZEPAM 7.5 MG PO CAPS: ORAL_CAPSULE | ORAL | 0 refills | Status: DC

## 2017-12-18 NOTE — Procedures (Unsigned)
[facility] [place], [place] [address] NAME:   Vanessa Allen                                                     DOB: [birth year] MEDICAL RECORD no: [id number]                                            DOS: 12/08/2017 REFERRING PHYSICIAN: [name], FNP STUDY PERFORMED: Home Sleep Test on Apnea Link HISTORY: Vanessa Allen is a 52 y.o. female patient, seen on 10-27-2017 in a referral from [place] for cyclic insomnia, problems to initiate and maintain sleep. Chief complaint according to patient: "I can't sleep "- this started 5 years or more ago- no medication, medical condition, trauma or surgery started this".    Mrs. Soper had problems to initiate and maintain sleep for several years now without being aware of a trigger for this development. She is not excessively daytime sleepy, on the contrary she could not nap when she wanted to.  She endorsed the Epworth Sleepiness Scale at 0 points, the fatigue severity however at 49 out of 63 points. 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  STUDY RESULTS:  Total Recording Time: 11 h 8 min, Flow evaluation time: 4 h 31 m Total Apnea/Hypopnea Index (AHI):  0.7 /h, RDI: 2.2 /h. Average Oxygen Saturation:   94 %; Lowest Oxygen Desaturation: 83 %. Total Time in Oxygen Saturation below 89 %: 15.0 minutes.  Average Heart Rate: 70 bpm (between 50 and 115 bpm). IMPRESSION: No evidence of Sleep Apnea by apnea link study. Neither desaturation nor arrhythmia noted. There were self-limited sinus tachycardia episodes recorded.  RECOMMENDATION: Chronic and Cyclic Insomnia, without identified organic cause.  Recommend Implementation of sleep hygiene, Melatonin trial, evaluation for Anxiety, consider Cognitive Behavior Therapy.  Fatigue can be related to inflammatory and autoimmune processes.  I certify that I have reviewed the raw data  recording prior to the issuance of this report in accordance with the standards of the American Academy of Sleep Medicine (AASM). Larey Seat, M.D.   12/18/2017   Medical Director of [facility] at [facility], accredited by the AASM. Diplomat of the ABPN and ABSM.

## 2017-12-18 NOTE — Telephone Encounter (Signed)
Here is the result of her download, her name was misspelled and could not be called up in [place]. The study was negtaive for OSA.   [facility] @[facility] [address], [place] [address] NAME:   Vanessa Allen                                                     DOB: Jul 11, 1965 MEDICAL RECORD no: [id number]                                            DOS: 12/08/2017 REFERRING PHYSICIAN: [name], FNP STUDY PERFORMED: Home Sleep Test on Apnea Link HISTORY: [name] is a 52 y.o. female patient, seen on 10-27-2017 in a referral from [place] for cyclic insomnia, problems to initiate and maintain sleep. Chief complaint according to patient: "I can't sleep "- this started 5 years or more ago- no medication, medical condition, trauma or surgery started this".    Mrs. Graven had problems to initiate and maintain sleep for several years now without being aware of a trigger for this development. She is not excessively daytime sleepy, on the contrary she could not nap when she wanted to.  She endorsed the Epworth Sleepiness Scale at 0 points, the fatigue severity however at 49 out of 63 points. 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  STUDY RESULTS:  Total Recording Time: 11 h 8 min, Flow evaluation time: 4 h 31 m Total Apnea/Hypopnea Index (AHI):  0.7 /h, RDI: 2.2 /h. Average Oxygen Saturation:   94 %; Lowest Oxygen Desaturation: 83 %. Total Time in Oxygen Saturation below 89 %: 15.0 minutes.  Average Heart Rate: 70 bpm (between 50 and 115 bpm). IMPRESSION: No evidence of Sleep Apnea by apnea link study. Neither desaturation nor arrhythmia noted. There were self-limited sinus tachycardia episodes recorded.  RECOMMENDATION: Chronic and Cyclic Insomnia, without identified organic cause.  Recommend Implementation of sleep hygiene, Melatonin trial, evaluation for Anxiety, consider Cognitive  Behavior Therapy.  Fatigue can be related to inflammatory and autoimmune processes.  I certify that I have reviewed the raw data recording prior to the issuance of this report in accordance with the standards of the American Academy of Sleep Medicine (AASM). Larey Seat, M.D.   12/18/2017   Medical Director of [facility] at [facility], accredited by the AASM. Diplomat of the ABPN and ABSM.

## 2017-12-18 NOTE — Progress Notes (Unsigned)
[facility] @[facility] [address], [place] [address] NAME:   Vanessa Allen                                                     DOB: 04-Apr-1965 MEDICAL RECORD no: [id number]                                            DOS: 12/08/2017 REFERRING PHYSICIAN: [name], FNP STUDY PERFORMED: Home Sleep Test on Apnea Link HISTORY: Vanessa Allen is a 52 y.o. female patient, seen on 10-27-2017 in a referral from [place] for cyclic insomnia, problems to initiate and maintain sleep. Chief complaint according to patient: "I can't sleep "- this started 5 years or more ago- no medication, medical condition, trauma or surgery started this".    [name] had problems to initiate and maintain sleep for several years now without being aware of a trigger for this development. She is not excessively daytime sleepy, on the contrary she could not nap when she wanted to.  She endorsed the Epworth Sleepiness Scale at 0 points, the fatigue severity however at 49 out of 63 points. 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  STUDY RESULTS:  Total Recording Time: 11 h 8 min, Flow evaluation time: 4 h 31 m Total Apnea/Hypopnea Index (AHI):  0.7 /h, RDI: 2.2 /h. Average Oxygen Saturation:   94 %; Lowest Oxygen Desaturation: 83 %. Total Time in Oxygen Saturation below 89 %: 15.0 minutes.  Average Heart Rate: 70 bpm (between 50 and 115 bpm). IMPRESSION: No evidence of Sleep Apnea by apnea link study. Neither desaturation nor arrhythmia noted. There were self-limited sinus tachycardia episodes recorded.  RECOMMENDATION: Chronic and Cyclic Insomnia, without identified organic cause.  Recommend Implementation of sleep hygiene, Melatonin trial, evaluation for Anxiety, consider Cognitive Behavior Therapy.  Fatigue can be related to inflammatory and autoimmune processes.  I certify that I have reviewed the raw data  recording prior to the issuance of this report in accordance with the standards of the American Academy of Sleep Medicine (AASM). Larey Seat, M.D.   12/18/2017   Medical Director of [facility] at [facility], accredited by the AASM. Diplomat of the ABPN and ABSM.

## 2017-12-19 DIAGNOSIS — Z1211 Encounter for screening for malignant neoplasm of colon: Secondary | ICD-10-CM | POA: Diagnosis not present

## 2017-12-19 LAB — HM COLONOSCOPY

## 2017-12-22 ENCOUNTER — Ambulatory Visit: Payer: BLUE CROSS/BLUE SHIELD | Admitting: Nurse Practitioner

## 2017-12-24 ENCOUNTER — Other Ambulatory Visit: Payer: Self-pay | Admitting: Nurse Practitioner

## 2017-12-24 ENCOUNTER — Encounter: Payer: Self-pay | Admitting: Nurse Practitioner

## 2017-12-29 ENCOUNTER — Other Ambulatory Visit: Payer: Self-pay | Admitting: Nurse Practitioner

## 2017-12-29 DIAGNOSIS — E119 Type 2 diabetes mellitus without complications: Secondary | ICD-10-CM

## 2018-01-05 ENCOUNTER — Other Ambulatory Visit: Payer: Self-pay | Admitting: Nurse Practitioner

## 2018-01-25 ENCOUNTER — Other Ambulatory Visit: Payer: Self-pay | Admitting: Nurse Practitioner

## 2018-01-25 DIAGNOSIS — G4700 Insomnia, unspecified: Secondary | ICD-10-CM

## 2018-01-26 ENCOUNTER — Other Ambulatory Visit: Payer: Self-pay | Admitting: Nurse Practitioner

## 2018-01-26 ENCOUNTER — Encounter: Payer: Self-pay | Admitting: Nurse Practitioner

## 2018-01-26 DIAGNOSIS — G4700 Insomnia, unspecified: Secondary | ICD-10-CM

## 2018-01-29 ENCOUNTER — Other Ambulatory Visit: Payer: Self-pay | Admitting: Nurse Practitioner

## 2018-01-29 DIAGNOSIS — G4700 Insomnia, unspecified: Secondary | ICD-10-CM

## 2018-01-29 MED ORDER — TEMAZEPAM 7.5 MG PO CAPS: ORAL_CAPSULE | ORAL | 2 refills | Status: DC

## 2018-01-30 DIAGNOSIS — D1803 Hemangioma of intra-abdominal structures: Secondary | ICD-10-CM | POA: Diagnosis not present

## 2018-02-07 ENCOUNTER — Other Ambulatory Visit: Payer: Self-pay | Admitting: Nurse Practitioner

## 2018-03-06 ENCOUNTER — Other Ambulatory Visit: Payer: Self-pay | Admitting: Nurse Practitioner

## 2018-03-09 ENCOUNTER — Ambulatory Visit: Payer: BLUE CROSS/BLUE SHIELD | Admitting: Nurse Practitioner

## 2018-03-11 ENCOUNTER — Other Ambulatory Visit: Payer: Self-pay | Admitting: Nurse Practitioner

## 2018-03-29 ENCOUNTER — Encounter: Payer: Self-pay | Admitting: Nurse Practitioner

## 2018-04-02 ENCOUNTER — Encounter: Payer: Self-pay | Admitting: Nurse Practitioner

## 2018-04-02 ENCOUNTER — Ambulatory Visit (INDEPENDENT_AMBULATORY_CARE_PROVIDER_SITE_OTHER): Payer: BLUE CROSS/BLUE SHIELD | Admitting: Nurse Practitioner

## 2018-04-02 VITALS — BP 110/72 | HR 70 | Ht 66.0 in | Wt 176.4 lb

## 2018-04-02 DIAGNOSIS — G4700 Insomnia, unspecified: Secondary | ICD-10-CM | POA: Diagnosis not present

## 2018-04-02 DIAGNOSIS — E119 Type 2 diabetes mellitus without complications: Secondary | ICD-10-CM

## 2018-04-02 DIAGNOSIS — R5383 Other fatigue: Secondary | ICD-10-CM

## 2018-04-02 MED ORDER — ZOLPIDEM TARTRATE 5 MG PO TABS: 5.0000 mg | ORAL_TABLET | Freq: Every evening | ORAL | 0 refills | Status: DC | PRN

## 2018-04-02 NOTE — Progress Notes (Addendum)
Subjective:     Patient ID: Vanessa Allen , female    DOB: [birth year] , 53 y.o.   MRN: [id number]   Chief Complaint  Patient presents with  . Insomnia    and also wants her a1c checked     HPI  She has been taking temazepam 15mg  with Trazadone without any effect.  She will lay there for hours before being able to go to sleep.  She will work long hours and only slept for about 2 hours.  No caffeine.  She will have the TV off.  She wears dark mask and listen to noise in the background, has tried ocean sounds and rain sounds.    Insomnia  Primary symptoms: no fragmented sleep, sleep disturbance, difficulty falling asleep.  PMH includes: associated symptoms present.  Diabetes  She presents for her follow-up diabetic visit. She has type 2 diabetes mellitus. Her disease course has been stable. Pertinent negatives for hypoglycemia include no confusion. Pertinent negatives for diabetes include no blurred vision. (130's blood sugar )     Past Medical History:  Diagnosis Date  . Diabetes mellitus without complication ([place])      Family History  Problem Relation Age of Onset  . Hyperlipidemia Mother   . Hypertension Mother   . Cancer Father   . Diabetes Father   . Heart disease Father   . Hypertension Brother   . Hypertension Daughter   . Breast cancer Neg Hx      Current Outpatient Medications:  .  baclofen (LIORESAL) 10 MG tablet, as needed. , Disp: , Rfl: 0 .  Continuous Blood Gluc Receiver (FREESTYLE LIBRE 14 DAY READER) DEVI, USE TO CHECK BLOOD SUGAR BEFORE MEALS AND AT BEDTIME, Disp: 1 Device, Rfl: 0 .  Continuous Blood Gluc Sensor (FREESTYLE LIBRE 14 DAY SENSOR) MISC, 1 each by Does not apply route 4 (four) times daily -  before meals and at bedtime., Disp: 2 each, Rfl: 11 .  esomeprazole (NEXIUM) 20 MG capsule, TAKE 1 CAPSULE BY MOUTH EVERY DAY AT LEAST 1 HOUR BEFORE A MEAL SWALLING WHOLE. DO NOT CRUSH OR CHEW GRANULES, Disp: 90 capsule, Rfl: 0 .  FARXIGA 5 MG TABS  tablet, TAKE 1 TABLET BY MOUTH EVERY DAY IN THE MORNING, Disp: 90 tablet, Rfl: 0 .  fluticasone (FLONASE) 50 MCG/ACT nasal spray, as needed. , Disp: , Rfl: 0 .  glucose blood (ACCU-CHEK GUIDE) test strip, 1 each by Other route as needed for other. Use as instructed, Disp: , Rfl:  .  Glucose Blood (GLUCOSE METER TEST VI), by In Vitro route 2 (two) times daily., Disp: , Rfl:  .  metFORMIN (GLUCOPHAGE) 500 MG tablet, TAKE 1 TABLET BY MOUTH EVERY DAY WITH BREAKFAST, Disp: 90 tablet, Rfl: 1 .  temazepam (RESTORIL) 7.5 MG capsule, Take 7.5 mg at bedtime may repeat in 1 hour if not effective (Patient not taking: Reported on 04/02/2018), Disp: 30 capsule, Rfl: 2 .  traZODone (DESYREL) 50 MG tablet, TAKE 1 TABLET BY MOUTH AT BEDTIME (Patient not taking: Reported on 04/02/2018), Disp: 30 tablet, Rfl: 3   No Known Allergies   Review of Systems  Eyes: Negative for blurred vision.  Psychiatric/Behavioral: Positive for sleep disturbance. Negative for confusion. The patient has insomnia.      Today's Vitals   04/02/18 1515  BP: 110/72  Pulse: 70  SpO2: 97%  Weight: 176 lb 6.4 oz (80 kg)  Height: 5\' 6"  (1.676 m)   Body mass index is 28.47 kg/m.  Objective:  Physical Exam Constitutional:      Appearance: Normal appearance. She is obese.  Cardiovascular:     Pulses: Normal pulses.     Heart sounds: Normal heart sounds. No murmur.  Pulmonary:     Effort: Pulmonary effort is normal.     Breath sounds: Normal breath sounds.  Skin:    General: Skin is warm and dry.  Neurological:     General: No focal deficit present.     Mental Status: She is alert and oriented to person, place, and time.  Psychiatric:        Mood and Affect: Mood normal.        Behavior: Behavior normal.        Thought Content: Thought content normal.        Judgment: Judgment normal.         Assessment And Plan:     1. Type 2 diabetes mellitus without complication, without long-term current use of insulin  (HCC)  Chronic, controlled  Continue with current medications  Encouraged to limit intake of sugary foods and drinks  Encouraged to increase physical activity to 150 minutes per week - Hemoglobin A1c  2. Fatigue, unspecified type  Ongoing she is not sleeping well  Her sleep study did not show sleep apnea - Thyroid antibodies  3. Insomnia, unspecified type  I would like to start her Hetlioz a sleep wake cycle mediction as the current sleep aids have not been effective.  Sent limited supply of ambien as the temazepam was ineffective        [name], FNP

## 2018-04-03 LAB — T3: T3, Total: 117 ng/dL (ref 71–180)

## 2018-04-03 LAB — T4, FREE: Free T4: 1.18 ng/dL (ref 0.82–1.77)

## 2018-04-03 LAB — HEMOGLOBIN A1C
Est. average glucose Bld gHb Est-mCnc: 134 mg/dL
Hgb A1c MFr Bld: 6.3 % — ABNORMAL HIGH (ref 4.8–5.6)

## 2018-04-03 LAB — THYROID ANTIBODIES
Thyroglobulin Antibody: <1 IU/mL (ref 0.0–0.9)
Thyroperoxidase Ab SerPl-aCnc: 22 IU/mL (ref 0–34)

## 2018-04-03 LAB — TSH: TSH: 1.05 u[IU]/mL (ref 0.450–4.500)

## 2018-04-04 ENCOUNTER — Encounter: Payer: Self-pay | Admitting: Nurse Practitioner

## 2018-04-11 ENCOUNTER — Other Ambulatory Visit: Payer: Self-pay | Admitting: Nurse Practitioner

## 2018-04-21 ENCOUNTER — Encounter: Payer: Self-pay | Admitting: Nurse Practitioner

## 2018-04-26 ENCOUNTER — Encounter: Payer: Self-pay | Admitting: Nurse Practitioner

## 2018-05-19 ENCOUNTER — Encounter: Payer: Self-pay | Admitting: Nurse Practitioner

## 2018-05-20 ENCOUNTER — Encounter: Payer: Self-pay | Admitting: Nurse Practitioner

## 2018-05-21 ENCOUNTER — Ambulatory Visit (INDEPENDENT_AMBULATORY_CARE_PROVIDER_SITE_OTHER): Payer: BLUE CROSS/BLUE SHIELD | Admitting: Nurse Practitioner

## 2018-05-21 ENCOUNTER — Encounter: Payer: Self-pay | Admitting: Nurse Practitioner

## 2018-05-21 ENCOUNTER — Other Ambulatory Visit: Payer: Self-pay

## 2018-05-21 VITALS — BP 129/93 | HR 72 | Temp 96.8°F

## 2018-05-21 DIAGNOSIS — R0789 Other chest pain: Secondary | ICD-10-CM | POA: Diagnosis not present

## 2018-05-21 DIAGNOSIS — G4700 Insomnia, unspecified: Secondary | ICD-10-CM

## 2018-05-21 DIAGNOSIS — Z634 Disappearance and death of family member: Secondary | ICD-10-CM | POA: Diagnosis not present

## 2018-05-21 DIAGNOSIS — F419 Anxiety disorder, unspecified: Secondary | ICD-10-CM | POA: Diagnosis not present

## 2018-05-21 HISTORY — DX: Other chest pain: R07.89

## 2018-05-21 MED ORDER — ALPRAZOLAM 0.25 MG PO TABS: 0.2500 mg | ORAL_TABLET | Freq: Three times a day (TID) | ORAL | 0 refills | Status: DC | PRN

## 2018-05-21 NOTE — Progress Notes (Signed)
Virtual Visit via telephone    This visit type was conducted due to national recommendations for restrictions regarding the COVID-19 Pandemic (e.g. social distancing) in an effort to limit this patient's exposure and mitigate transmission in our community.  Patients identity confirmed using two different identifiers.  This format is felt to be most appropriate for this patient at this time.  All issues noted in this document were discussed and addressed.  No physical exam was performed (except for noted visual exam findings with Video Visits).    Date:  06/14/2018   ID:  Weston Brass, DOB 02-04-1965, MRN [id number]  Patient Location:  Home  Provider location:   Office    Chief Complaint:  Anxiety  History of Present Illness:    Vanessa Allen is a 53 y.o. female who presents via video conferencing for a telehealth visit today.    The patient does not have symptoms concerning for COVID-19 infection (fever, chills, cough, or new shortness of breath).   She will feel butterflies and weird left sided chest pain sharp.  Not always  associated with her thinking about her friend who has recently passed away.  Doesn't last long enough to feel like need to take anything or be seen.  Noticed over the last 2-3 weeks.  Denies shortness of breath with the chest pain.  She has been to a cardiologist in the past for palpitations - she was on atenolol for a short time.  EKG's have been abnormal in the past.   Left side chest discomfort - sometimes can reproduce with taking a short breath.   She had been living together in [place] for initial visit. Patient reports no chest pain, dizziness, nausea, palpitations or shortness of breath. Symptoms occur constantly. Nothing aggravates the symptoms. The quality of sleep is poor. Nighttime awakenings: several.   Risk factors: recent loss of best friend. There is no history of anemia. Treatments tried: antisleep      Past  Medical History:  Diagnosis Date  . Diabetes mellitus without complication [facility])    Past Surgical History:  Procedure Laterality Date  . APPENDECTOMY    . CHOLECYSTECTOMY    . MYOMECTOMY       Current Meds  Medication Sig  . baclofen (LIORESAL) 10 MG tablet as needed.   . Continuous Blood Gluc Receiver (FREESTYLE LIBRE 14 DAY READER) DEVI USE TO CHECK BLOOD SUGAR BEFORE MEALS AND AT BEDTIME  . Continuous Blood Gluc Sensor (FREESTYLE LIBRE 14 DAY SENSOR) MISC 1 each by Does not apply route 4 (four) times daily -  before meals and at bedtime.  . fluticasone (FLONASE) 50 MCG/ACT nasal spray as needed.   Marland Kitchen glucose blood (ACCU-CHEK GUIDE) test strip 1 each by Other route as needed for other. Use as instructed  . Glucose Blood (GLUCOSE METER TEST VI) by In Vitro route 2 (two) times daily.  . metFORMIN (GLUCOPHAGE) 500 MG tablet TAKE 1 TABLET BY MOUTH EVERY DAY WITH BREAKFAST  . zolpidem (AMBIEN) 5 MG tablet Take 1 tablet (5 mg total) by mouth at bedtime as needed for sleep.  . [DISCONTINUED] esomeprazole (NEXIUM) 20 MG capsule TAKE 1 CAPSULE BY MOUTH EVERY DAY AT LEAST 1 HOUR BEFORE A MEAL SWALLING WHOLE. DO NOT CRUSH OR CHEW GRANULES  . [DISCONTINUED] FARXIGA 5 MG TABS tablet TAKE 1 TABLET BY MOUTH EVERY DAY IN THE MORNING  . [DISCONTINUED] temazepam (RESTORIL) 7.5 MG capsule Take 7.5 mg at bedtime may repeat  in 1 hour if not effective     Allergies:   Patient has no known allergies.   Social History   Tobacco Use  . Smoking status: Never Smoker  . Smokeless tobacco: Never Used  Substance Use Topics  . Alcohol use: Yes    Alcohol/week: 0.0 standard drinks    Comment: occasional  . Drug use: No     Family Hx: The patient's family history includes Cancer in her father; Diabetes in her father; Heart disease in her father; Hyperlipidemia in her mother; Hypertension in her brother, daughter, and mother. There is no history of Breast cancer.  ROS:   Please see the history of present  illness.    Review of Systems  Constitutional: Negative for malaise/fatigue.  Respiratory: Negative for cough, shortness of breath and wheezing.   Cardiovascular: Negative for chest pain, palpitations and leg swelling.  Gastrointestinal: Negative for nausea and vomiting.  Neurological: Negative for dizziness and tingling.    All other systems reviewed and are negative.   Labs/Other Tests and Data Reviewed:    Recent Labs: 04/02/2018: TSH 1.050   Recent Lipid Panel No results found for: CHOL, TRIG, HDL, CHOLHDL, LDLCALC, LDLDIRECT  Wt Readings from Last 3 Encounters:  04/02/18 176 lb 6.4 oz (80 kg)  12/01/17 177 lb 6.4 oz (80.5 kg)  10/27/17 180 lb (81.6 kg)     Exam:    Vital Signs:  BP (!) 129/93   Pulse 72   Temp (!) 96.8 F (36 C)     Physical Exam  Constitutional: She is oriented to person, place, and time.  Neurological: She is alert and oriented to person, place, and time.    ASSESSMENT & PLAN:    1. Chest discomfort  Reports a sensation to her chest at rest, she is advised to seek medical attention to ensure not cardiac related   2. Anxiety  Recent loss of her long time friend and is having increased anxiety related to this  Will provide a low dose of alprazolam  She is to consider counseling - ALPRAZolam (XANAX) 0.25 MG tablet; Take 1 tablet (0.25 mg total) by mouth 3 (three) times daily as needed for anxiety.  Dispense: 30 tablet; Refill: 0   COVID-19 Education: The signs and symptoms of COVID-19 were discussed with the patient and how to seek care for testing (follow up with PCP or arrange E-visit).  The importance of social distancing was discussed today.  Patient Risk:   After full review of this patients clinical status, I feel that they are at least moderate risk at this time.  Time:   Today, I have spent 15  minutes/ seconds with the patient with telehealth technology discussing above diagnoses.     Medication Adjustments/Labs and Tests  Ordered: Current medicines are reviewed at length with the patient today.  Concerns regarding medicines are outlined above.   Tests Ordered: No orders of the defined types were placed in this encounter.   Medication Changes: Meds ordered this encounter  Medications  . ALPRAZolam (XANAX) 0.25 MG tablet    Sig: Take 1 tablet (0.25 mg total) by mouth 3 (three) times daily as needed for anxiety.    Dispense:  30 tablet    Refill:  0    Disposition:  Follow up in 3 month(s)  Signed, [name], FNP

## 2018-06-06 ENCOUNTER — Other Ambulatory Visit: Payer: Self-pay | Admitting: Nurse Practitioner

## 2018-06-06 ENCOUNTER — Encounter: Payer: Self-pay | Admitting: Nurse Practitioner

## 2018-06-09 ENCOUNTER — Other Ambulatory Visit: Payer: Self-pay | Admitting: Nurse Practitioner

## 2018-06-10 ENCOUNTER — Encounter: Payer: Self-pay | Admitting: Nurse Practitioner

## 2018-06-14 ENCOUNTER — Encounter: Payer: Self-pay | Admitting: Nurse Practitioner

## 2018-07-03 ENCOUNTER — Encounter: Payer: Self-pay | Admitting: Nurse Practitioner

## 2018-07-08 ENCOUNTER — Telehealth: Payer: Self-pay

## 2018-07-08 ENCOUNTER — Other Ambulatory Visit: Payer: Self-pay | Admitting: Internal Medicine

## 2018-07-08 ENCOUNTER — Other Ambulatory Visit: Payer: Self-pay | Admitting: Nurse Practitioner

## 2018-07-08 DIAGNOSIS — Z1231 Encounter for screening mammogram for malignant neoplasm of breast: Secondary | ICD-10-CM

## 2018-07-08 NOTE — Telephone Encounter (Signed)
I called pt to notify her that a sleep drug medication is being sent to her in 60 day supply until we figure out if her insurance will be approved or not. It is ok to go ahead and try the medication per [name] FNP-BC. YRL,RMA

## 2018-07-17 ENCOUNTER — Ambulatory Visit: Payer: BLUE CROSS/BLUE SHIELD

## 2018-07-20 ENCOUNTER — Ambulatory Visit
Admission: RE | Admit: 2018-07-20 | Discharge: 2018-07-20 | Disposition: A | Discharge status: Home / Self Care | Payer: BC Managed Care – PPO | Source: Ambulatory Visit | Attending: Nurse Practitioner | Admitting: Nurse Practitioner

## 2018-07-20 ENCOUNTER — Encounter: Payer: BLUE CROSS/BLUE SHIELD | Admitting: Nurse Practitioner

## 2018-07-20 ENCOUNTER — Encounter: Payer: Self-pay | Admitting: Nurse Practitioner

## 2018-07-20 ENCOUNTER — Other Ambulatory Visit: Payer: Self-pay

## 2018-07-20 ENCOUNTER — Ambulatory Visit (INDEPENDENT_AMBULATORY_CARE_PROVIDER_SITE_OTHER): Payer: BC Managed Care – PPO | Admitting: Nurse Practitioner

## 2018-07-20 ENCOUNTER — Other Ambulatory Visit (HOSPITAL_COMMUNITY)
Admission: RE | Admit: 2018-07-20 | Discharge: 2018-07-20 | Disposition: A | Discharge status: Home / Self Care | Payer: BC Managed Care – PPO | Source: Ambulatory Visit | Attending: Nurse Practitioner | Admitting: Nurse Practitioner

## 2018-07-20 VITALS — BP 128/86 | HR 83 | Temp 98.4°F | Ht 65.4 in | Wt 180.0 lb

## 2018-07-20 DIAGNOSIS — R079 Chest pain, unspecified: Secondary | ICD-10-CM | POA: Diagnosis not present

## 2018-07-20 DIAGNOSIS — Z124 Encounter for screening for malignant neoplasm of cervix: Secondary | ICD-10-CM

## 2018-07-20 DIAGNOSIS — G4700 Insomnia, unspecified: Secondary | ICD-10-CM | POA: Diagnosis not present

## 2018-07-20 DIAGNOSIS — R0789 Other chest pain: Secondary | ICD-10-CM

## 2018-07-20 DIAGNOSIS — Z23 Encounter for immunization: Secondary | ICD-10-CM | POA: Diagnosis not present

## 2018-07-20 DIAGNOSIS — Z0000 Encounter for general adult medical examination without abnormal findings: Secondary | ICD-10-CM

## 2018-07-20 DIAGNOSIS — Z6829 Body mass index (BMI) 29.0-29.9, adult: Secondary | ICD-10-CM | POA: Diagnosis not present

## 2018-07-20 DIAGNOSIS — F419 Anxiety disorder, unspecified: Secondary | ICD-10-CM

## 2018-07-20 DIAGNOSIS — E119 Type 2 diabetes mellitus without complications: Secondary | ICD-10-CM

## 2018-07-20 HISTORY — DX: Other chest pain: R07.89

## 2018-07-20 LAB — LIPID PANEL
Chol/HDL Ratio: 2.9 ratio (ref 0.0–4.4)
Cholesterol, Total: 159 mg/dL (ref 100–199)
HDL: 55 mg/dL (ref >39)
LDL Calculated: 83 mg/dL (ref 0–99)
Triglycerides: 104 mg/dL (ref 0–149)
VLDL Cholesterol Cal: 21 mg/dL (ref 5–40)

## 2018-07-20 LAB — POCT URINALYSIS DIPSTICK
Bilirubin, UA: NEGATIVE
Blood, UA: NEGATIVE
Glucose, UA: POSITIVE — AB
Ketones, UA: NEGATIVE
Leukocytes, UA: NEGATIVE
Nitrite, UA: NEGATIVE
Protein, UA: NEGATIVE
Spec Grav, UA: 1.02 (ref 1.010–1.025)
Urobilinogen, UA: 0.2 E.U./dL
pH, UA: 5 (ref 5.0–8.0)

## 2018-07-20 LAB — CMP14 + ANION GAP
ALT: 27 IU/L (ref 0–32)
AST: 23 IU/L (ref 0–40)
Albumin/Globulin Ratio: 1.9 (ref 1.2–2.2)
Albumin: 4.8 g/dL (ref 3.8–4.8)
Alkaline Phosphatase: 89 IU/L (ref 39–117)
Anion Gap: 15 mmol/L (ref 10.0–18.0)
BUN/Creatinine Ratio: 16 (ref 9–23)
BUN: 14 mg/dL (ref 6–24)
Bilirubin Total: 0.4 mg/dL (ref 0.0–1.2)
CO2: 20 mmol/L (ref 20–29)
Calcium: 10 mg/dL (ref 8.7–10.2)
Chloride: 103 mmol/L (ref 96–106)
Creatinine, Ser: 0.85 mg/dL (ref 0.57–1.00)
GFR calc Af Amer: 92 mL/min/{1.73_m2} (ref >59)
GFR calc non Af Amer: 80 mL/min/{1.73_m2} (ref >59)
Globulin, Total: 2.5 g/dL (ref 1.5–4.5)
Glucose: 128 mg/dL — ABNORMAL HIGH (ref 65–99)
Potassium: 4.2 mmol/L (ref 3.5–5.2)
Sodium: 138 mmol/L (ref 134–144)
Total Protein: 7.3 g/dL (ref 6.0–8.5)

## 2018-07-20 LAB — POCT UA - MICROALBUMIN
Albumin/Creatinine Ratio, Urine, POC: 30
Creatinine, POC: 200 mg/dL
Microalbumin Ur, POC: 30 mg/L

## 2018-07-20 LAB — CBC
Hematocrit: 44 % (ref 34.0–46.6)
Hemoglobin: 14.6 g/dL (ref 11.1–15.9)
MCH: 27.2 pg (ref 26.6–33.0)
MCHC: 33.2 g/dL (ref 31.5–35.7)
MCV: 82 fL (ref 79–97)
Platelets: 261 10*3/uL (ref 150–450)
RBC: 5.37 x10E6/uL — ABNORMAL HIGH (ref 3.77–5.28)
RDW: 13.4 % (ref 11.7–15.4)
WBC: 4.6 10*3/uL (ref 3.4–10.8)

## 2018-07-20 LAB — HM DIABETES EYE EXAM

## 2018-07-20 IMAGING — CR CHEST - 2 VIEW
2 series · 2 of 2 positions shown · non-contrast
Comparison: None.

CLINICAL DATA: Chest pain

EXAM:
CHEST - 2 VIEW

[w chest pa]
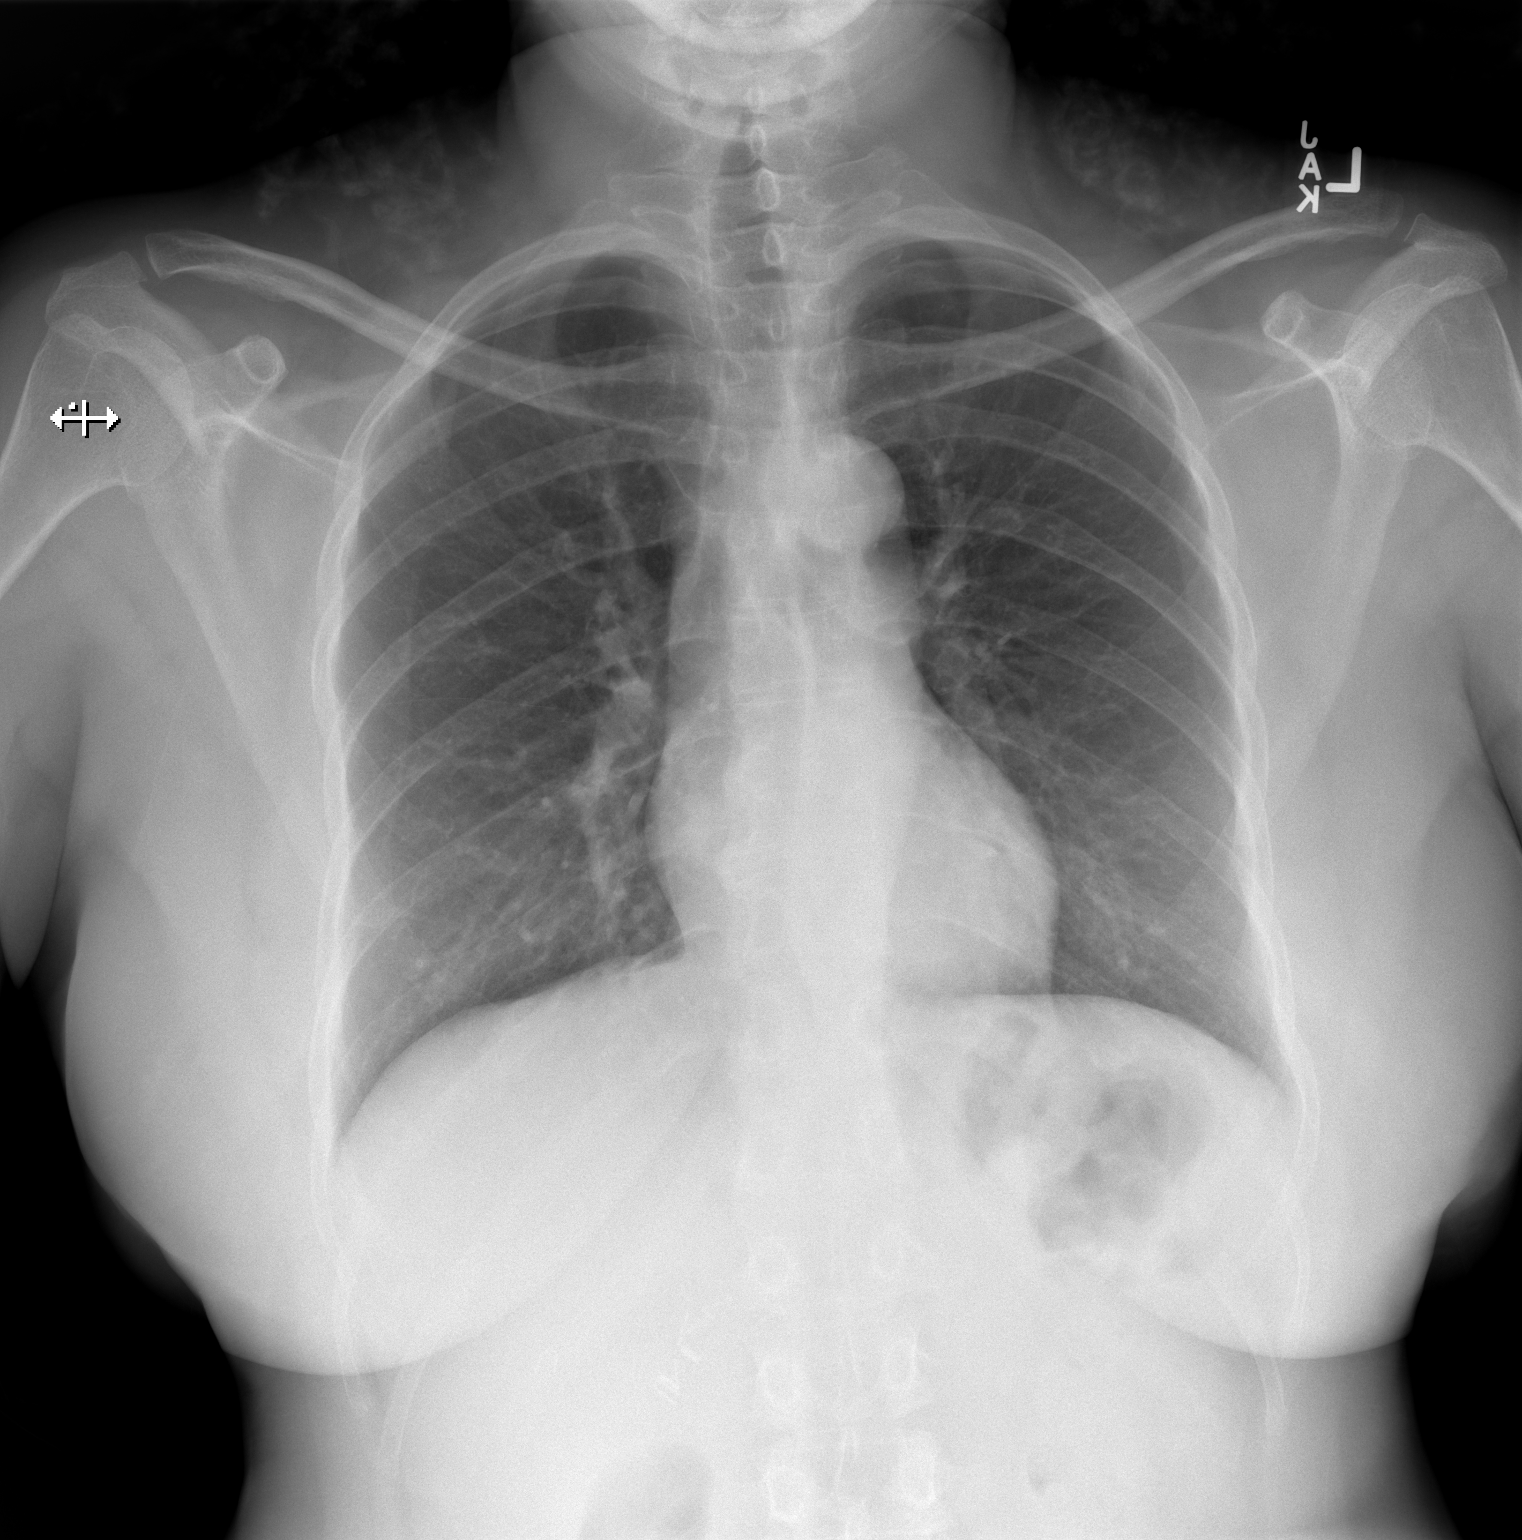

[w chest lat]
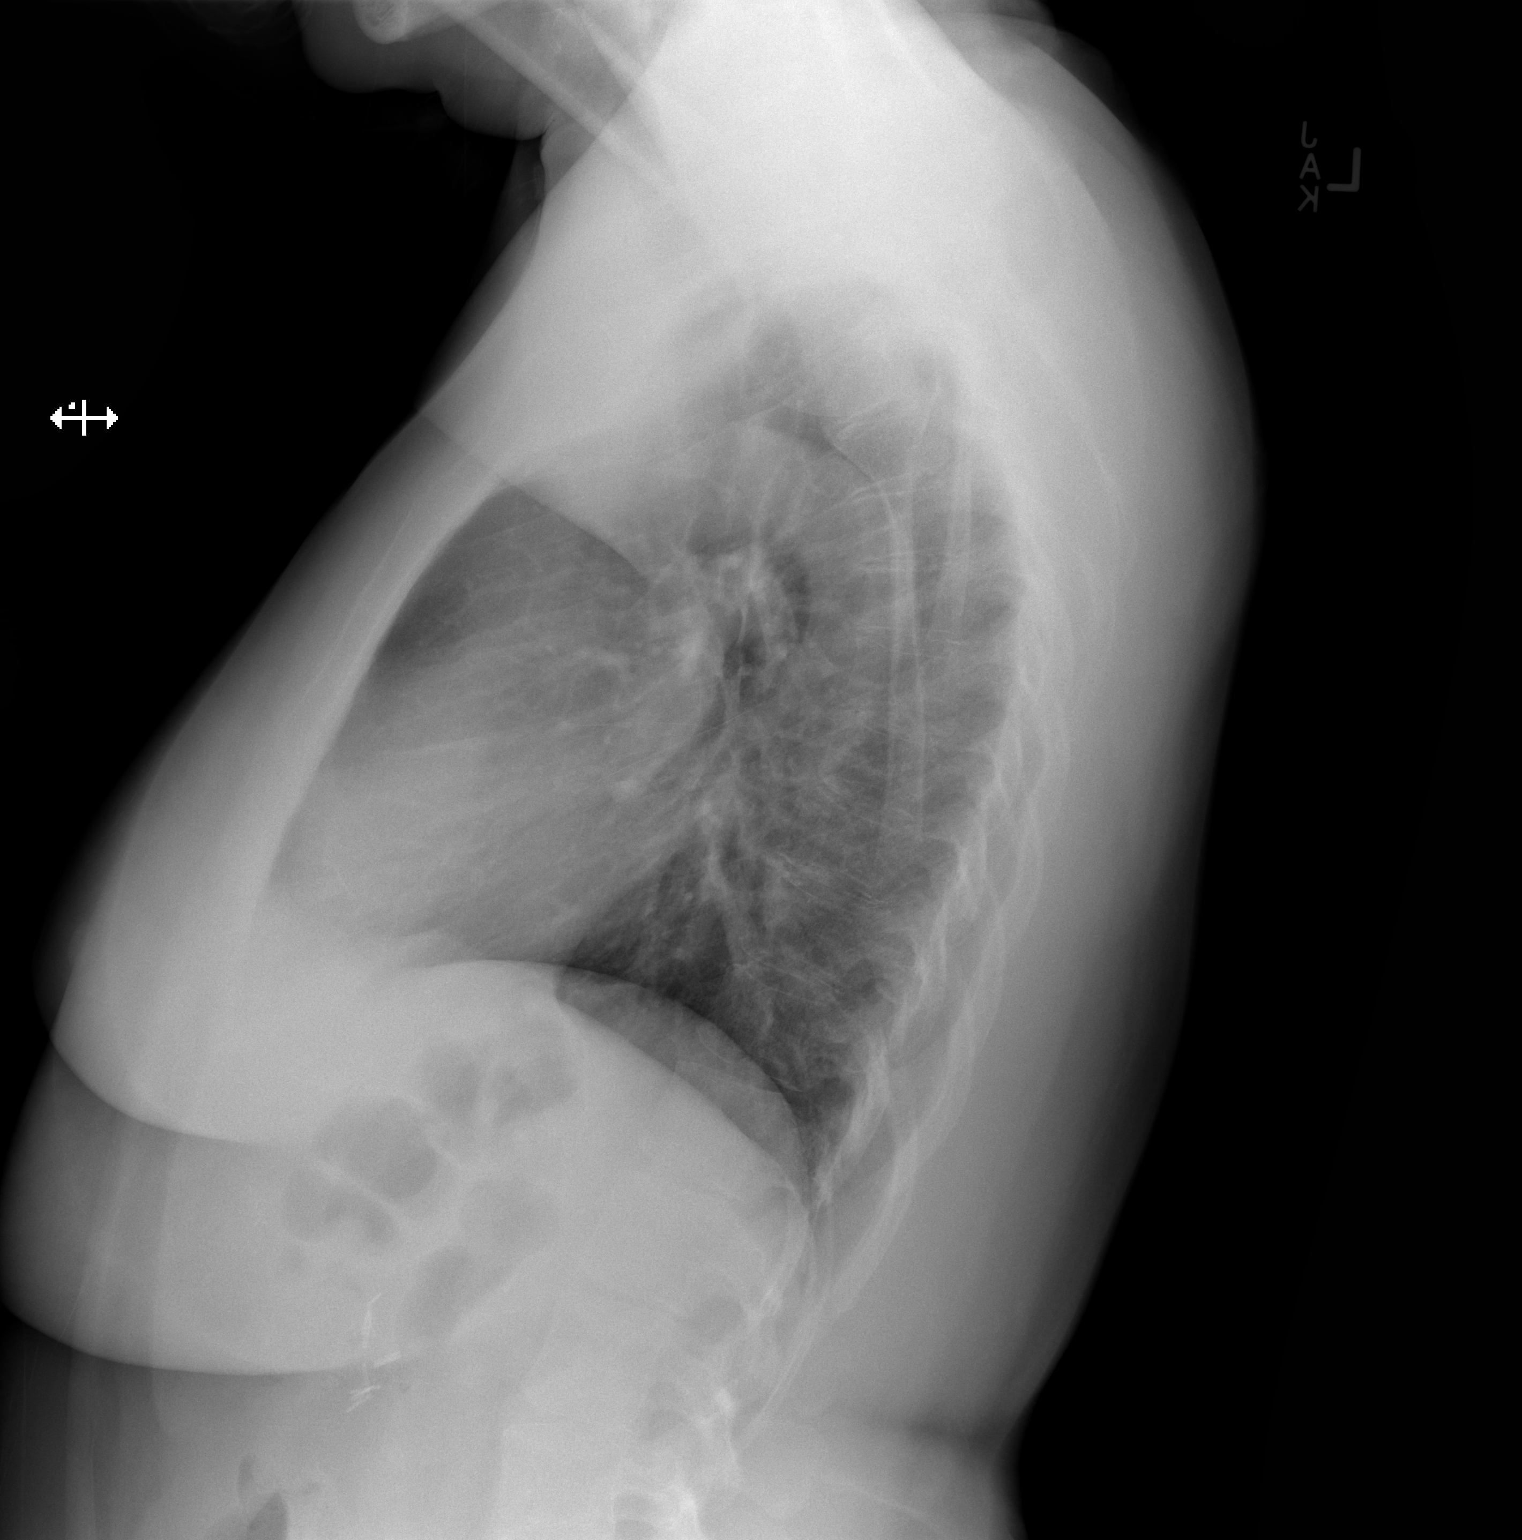

[2 of 2 positions shown; findings below may reference images not displayed]

FINDINGS: Lungs are clear. Heart size and pulmonary vascularity are normal. No
adenopathy. No pneumothorax. No bone lesions.
IMPRESSION: No edema or consolidation.

## 2018-07-20 NOTE — Progress Notes (Signed)
Subjective:     Patient ID: Vanessa Allen , female    DOB: 12-May-1965 , 53 y.o.   MRN: [id number]   Chief Complaint  Patient presents with  . Annual Exam   The patient states she uses post menopausal status for birth control. Last LMP was No LMP recorded. Patient is postmenopausal.. Negative for Dysmenorrhea and Negative for Menorrhagia Mammogram last done 05/2017. Negative for: breast discharge, breast lump(s), breast pain and breast self exam.  Pertinent negatives include abnormal bleeding (hematology), anxiety, decreased libido, depression, difficulty falling sleep, dyspareunia, history of infertility, nocturia, sexual dysfunction, sleep disturbances, urinary incontinence, urinary urgency, vaginal discharge and vaginal itching. Diet regular, avoids fried food and rarely eats out. The patient states her exercise level is  minimal, walks a lot with work.       The patient's tobacco use is:  Social History   Tobacco Use  Smoking Status Never Smoker  Smokeless Tobacco Never Used   She has been exposed to passive smoke. The patient's alcohol use is:  Social History   Substance and Sexual Activity  Alcohol Use Yes  . Alcohol/week: 0.0 standard drinks   Comment: occasional   Additional information: Last pap done 2 years ago (Dr. Vella Raring), next one scheduled for 2021.   HPI  Here for HM  Also atypical chest pain - left sided area intermittently.  She is concerned on why continues to return.  She has had a cardiac workup in the past about 10 years ago.    Insomnia Primary symptoms: fragmented sleep.  The current episode started more than one month (she took [place] for 2 weeks without relief). The problem occurs nightly. The symptoms are relieved by activity. PMH includes: associated symptoms present.     Past Medical History:  Diagnosis Date  . Diabetes mellitus without complication ([place])      Family History  Problem Relation Age of Onset  . Hyperlipidemia Mother   .  Hypertension Mother   . Cancer Father   . Diabetes Father   . Heart disease Father   . Hypertension Brother   . Hypertension Daughter   . Breast cancer Neg Hx      Current Outpatient Medications:  .  ALPRAZolam (XANAX) 0.25 MG tablet, Take 1 tablet (0.25 mg total) by mouth 3 (three) times daily as needed for anxiety., Disp: 30 tablet, Rfl: 0 .  baclofen (LIORESAL) 10 MG tablet, as needed. , Disp: , Rfl: 0 .  Continuous Blood Gluc Receiver (FREESTYLE LIBRE 14 DAY READER) DEVI, USE TO CHECK BLOOD SUGAR BEFORE MEALS AND AT BEDTIME, Disp: 1 Device, Rfl: 0 .  Continuous Blood Gluc Sensor (FREESTYLE LIBRE 14 DAY SENSOR) MISC, 1 each by Does not apply route 4 (four) times daily -  before meals and at bedtime., Disp: 2 each, Rfl: 11 .  esomeprazole (NEXIUM) 20 MG capsule, TAKE ONE CAPSULE BY MOUTH DAILY AT LEAST 1 HOUR BEFORE A MEAL, SWALLOWING WHOLE. DO NOT CRUSH OR CHEW GRANULES, Disp: 90 capsule, Rfl: 0 .  FARXIGA 5 MG TABS tablet, TAKE 1 TABLET BY MOUTH EVERY DAY IN THE MORNING, Disp: 90 tablet, Rfl: 0 .  fluticasone (FLONASE) 50 MCG/ACT nasal spray, as needed. , Disp: , Rfl: 0 .  glucose blood (ACCU-CHEK GUIDE) test strip, 1 each by Other route as needed for other. Use as instructed, Disp: , Rfl:  .  Glucose Blood (GLUCOSE METER TEST VI), by In Vitro route 2 (two) times daily., Disp: , Rfl:  .  metFORMIN (GLUCOPHAGE) 500 MG tablet, TAKE 1 TABLET BY MOUTH EVERY DAY WITH BREAKFAST, Disp: 90 tablet, Rfl: 1 .  progesterone (PROMETRIUM) 100 MG capsule, Take 100 mg by mouth daily., Disp: , Rfl:  .  zolpidem (AMBIEN) 5 MG tablet, Take 1 tablet (5 mg total) by mouth at bedtime as needed for sleep., Disp: 30 tablet, Rfl: 0   No Known Allergies   Review of Systems  Constitutional: Negative.   HENT: Negative.   Eyes: Negative.   Respiratory: Negative.   Cardiovascular: Negative.  Negative for chest pain (intermittent sharp pain), palpitations and leg swelling.  Gastrointestinal: Negative.    Endocrine: Negative.   Genitourinary: Negative.   Musculoskeletal: Negative.   Skin: Negative.   Allergic/Immunologic: Negative.   Neurological: Negative.   Hematological: Negative.   Psychiatric/Behavioral: The patient has insomnia.      Today's Vitals   07/20/18 0917  BP: 128/86  Pulse: 83  Temp: 98.4 F (36.9 C)  TempSrc: Oral  Weight: 180 lb (81.6 kg)  Height: 5' 5.4" (1.661 m)  PainSc: 0-No pain   Body mass index is 29.59 kg/m.   Objective:  Physical Exam Vitals signs reviewed.  Constitutional:      Appearance: Normal appearance. She is well-developed.  HENT:     Head: Normocephalic and atraumatic.     Right Ear: Hearing, tympanic membrane, ear canal and external ear normal.     Left Ear: Hearing, tympanic membrane, ear canal and external ear normal.     Mouth/Throat:     Mouth: Mucous membranes are moist.  Eyes:     General: Lids are normal.     Conjunctiva/sclera: Conjunctivae normal.     Pupils: Pupils are equal, round, and reactive to light.     Funduscopic exam:    Right eye: No papilledema.        Left eye: No papilledema.  Neck:     Musculoskeletal: Full passive range of motion without pain, normal range of motion and neck supple.     Thyroid: No thyroid mass.     Vascular: No carotid bruit.  Cardiovascular:     Rate and Rhythm: Normal rate and regular rhythm.     Pulses: Normal pulses.     Heart sounds: Normal heart sounds. No murmur.  Pulmonary:     Effort: Pulmonary effort is normal.     Breath sounds: Normal breath sounds.  Abdominal:     General: Abdomen is flat. Bowel sounds are normal.     Palpations: Abdomen is soft.  Musculoskeletal: Normal range of motion.        General: No swelling.     Right lower leg: No edema.     Left lower leg: No edema.  Skin:    General: Skin is warm and dry.     Capillary Refill: Capillary refill takes less than 2 seconds.     Findings: Rash (long standing rash to inner right lower leg) present.   Neurological:     General: No focal deficit present.     Mental Status: She is alert and oriented to person, place, and time.     Cranial Nerves: No cranial nerve deficit.     Sensory: No sensory deficit.  Psychiatric:        Mood and Affect: Mood normal.        Behavior: Behavior normal.        Thought Content: Thought content normal.        Judgment: Judgment normal.  Assessment And Plan:   1. Encounter for general adult medical examination w/o abnormal findings . Behavior modifications discussed and diet history reviewed.   . Pt will continue to exercise regularly and modify diet with low GI, plant based foods and decrease intake of processed foods.  . Recommend intake of daily multivitamin, Vitamin D, and calcium.  . Recommend mammogram and colonoscopy for preventive screenings, as well as recommend immunizations that include influenza, TDAP, and Shingles - POCT Urinalysis Dipstick (81002) - POCT UA - Microalbumin - CBC no Diff - Vitamin D (25 hydroxy)  2. Encounter for Papanicolaou smear of cervix  PAP done, no abnormal findings - Cytology -Pap Smear  3. Body mass index 29.0-29.9, adult  Chronic  Discussed healthy diet and regular exercise options   Encouraged to exercise at least 150 minutes per week with 2 days of strength training  Also stressed the importance of healthy sleep  4. Atypical chest pain  No pain ilicited on physical exam  EKG normal - DG Chest 2 View; Future - EKG 12-Lead  5. Type 2 diabetes mellitus without complication, without long-term current use of insulin (HCC)  Chronic, controlled  Continue with current medications  Encouraged to limit intake of sugary foods and drinks  Encouraged to increase physical activity to 150 minutes per week as tolerated - Hemoglobin A1c; Future - CMP14 + Anion Gap - Lipid Profile  6. Insomnia, unspecified type  She is trying the new medication Heltioz and does not feel has been  effective  Encouraged to allow more time  7. Anxiety  Chronic  She continues to have challenges with anxiety due to her recent death of her roommate/friend    Continue with as needed medication  8. Encounter for immunization  - Pneumococcal polysaccharide vaccine 23-valent greater than or equal to 2yo subcutaneous/IM  [name], FNP    THE PATIENT IS ENCOURAGED TO PRACTICE SOCIAL DISTANCING DUE TO THE COVID-19 PANDEMIC.

## 2018-07-21 ENCOUNTER — Encounter: Payer: Self-pay | Admitting: Nurse Practitioner

## 2018-07-21 ENCOUNTER — Ambulatory Visit
Admission: RE | Admit: 2018-07-21 | Discharge: 2018-07-21 | Disposition: A | Discharge status: Home / Self Care | Payer: BC Managed Care – PPO | Source: Ambulatory Visit | Attending: Nurse Practitioner | Admitting: Nurse Practitioner

## 2018-07-21 DIAGNOSIS — Z1231 Encounter for screening mammogram for malignant neoplasm of breast: Secondary | ICD-10-CM | POA: Diagnosis not present

## 2018-07-21 IMAGING — MG DIGITAL SCREENING BILATERAL MAMMOGRAM WITH TOMO AND CAD
8 series · 8 of 24 positions shown · non-contrast
Comparison: Previous exam(s).

CLINICAL DATA: Screening.

EXAM:
DIGITAL SCREENING BILATERAL MAMMOGRAM WITH TOMO AND CAD

[L CC synth-2D]
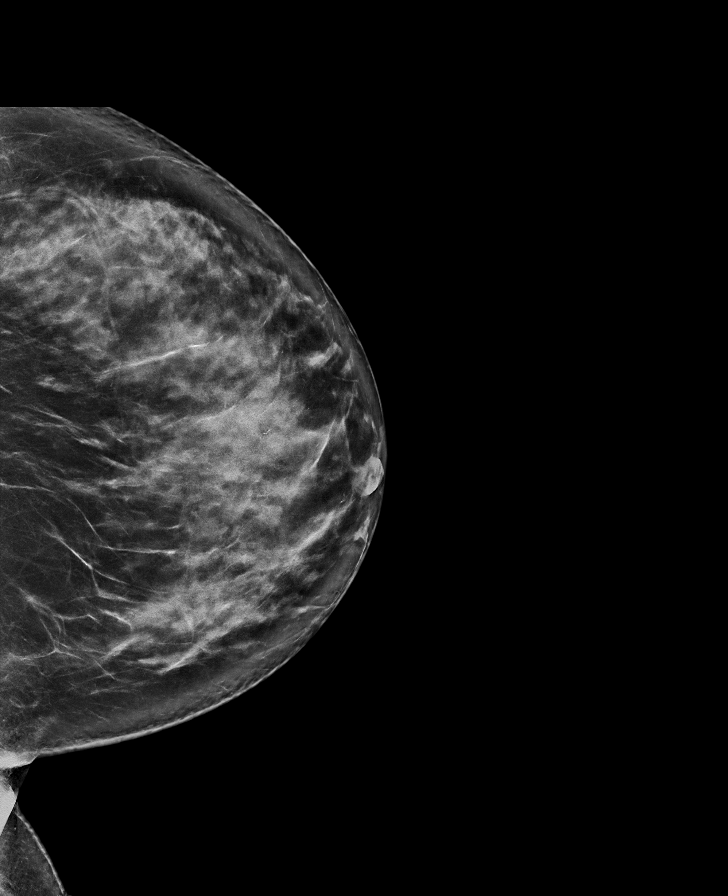

[R MLO synth-2D]
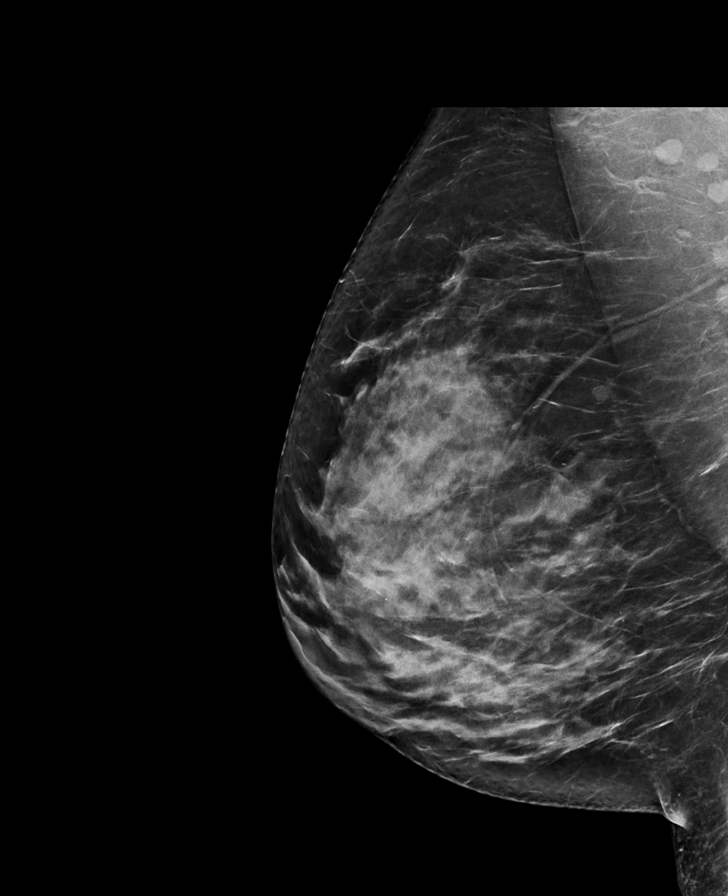

[R CC synth-2D]
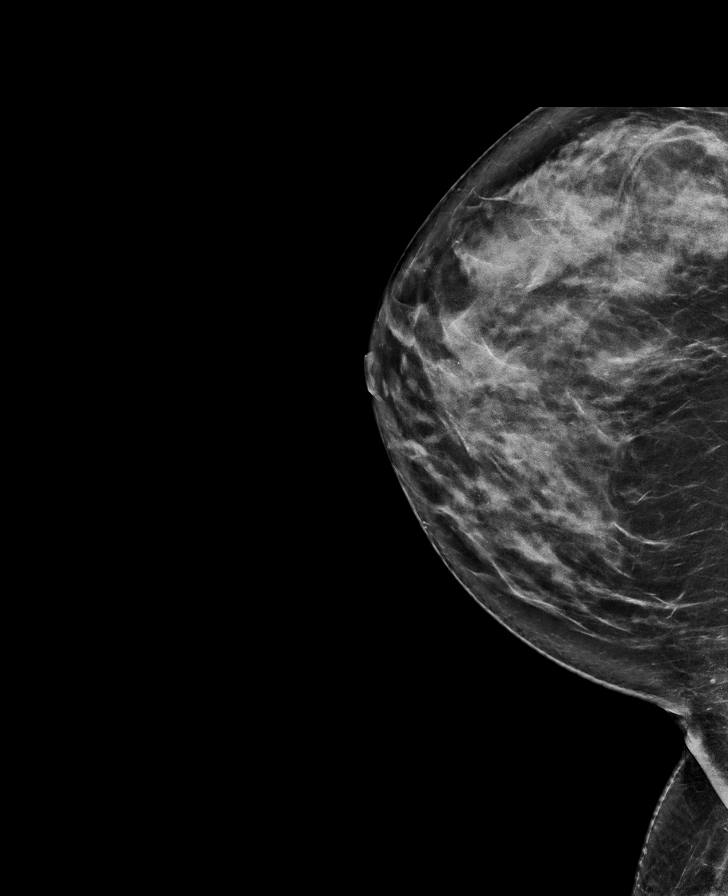

[L MLO synth-2D]
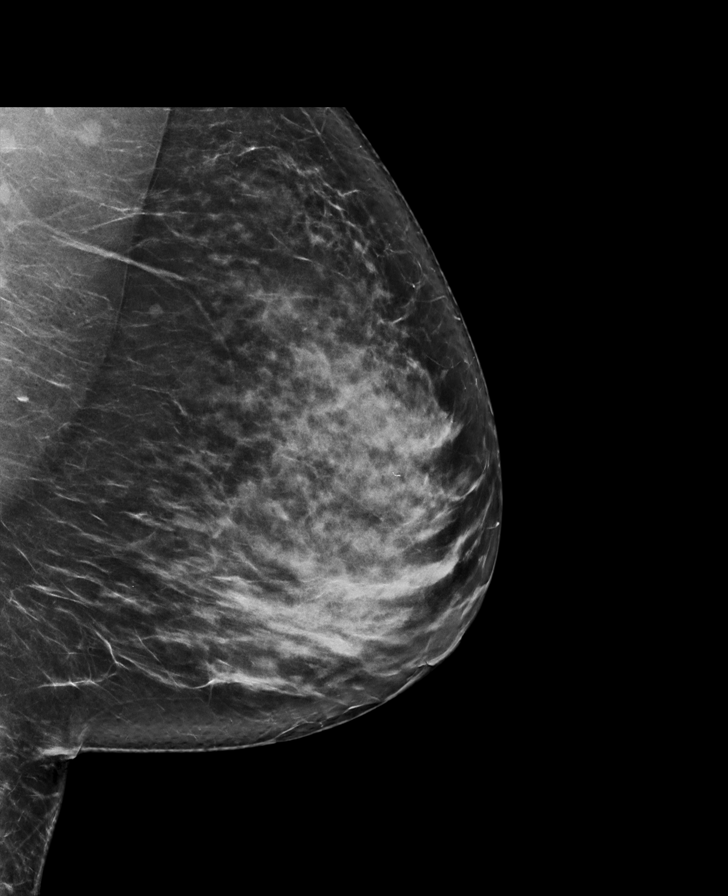

[L MLO tomo · tomo slice 44/87.0]
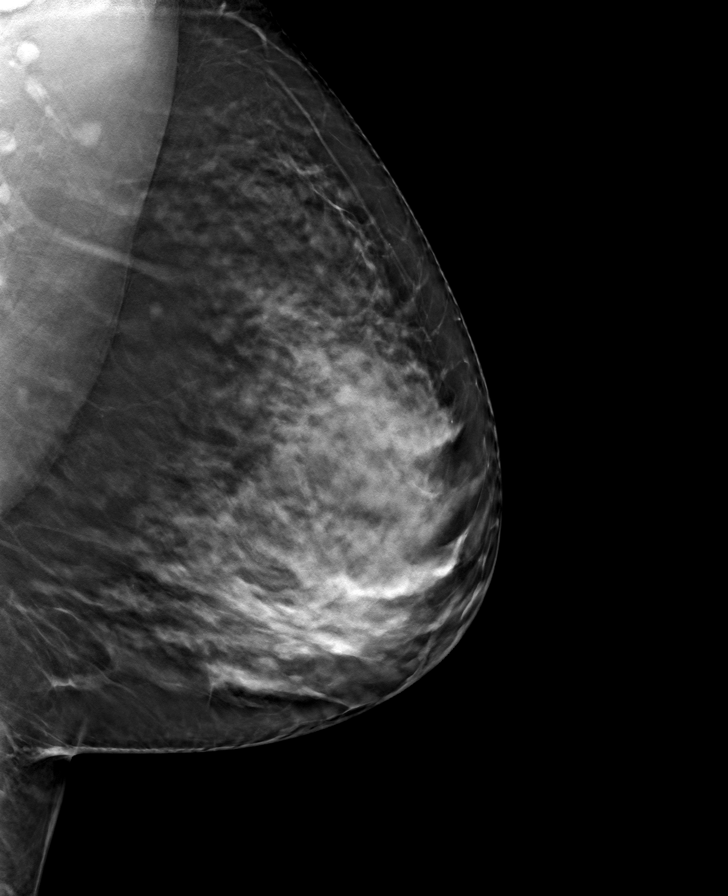

[L CC tomo · tomo slice 47/93.0]
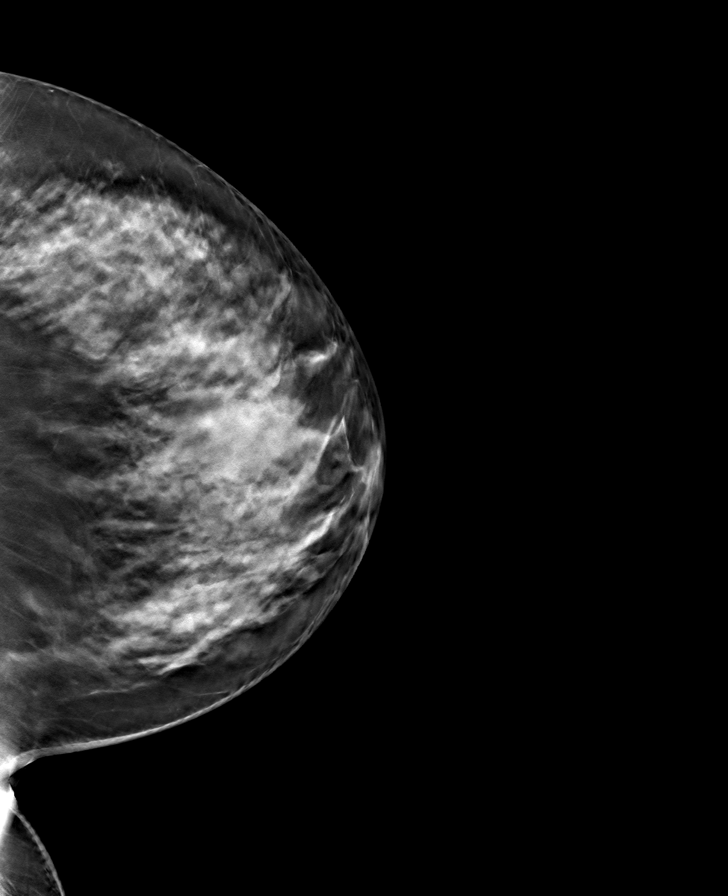

[R CC tomo · tomo slice 43/85.0]
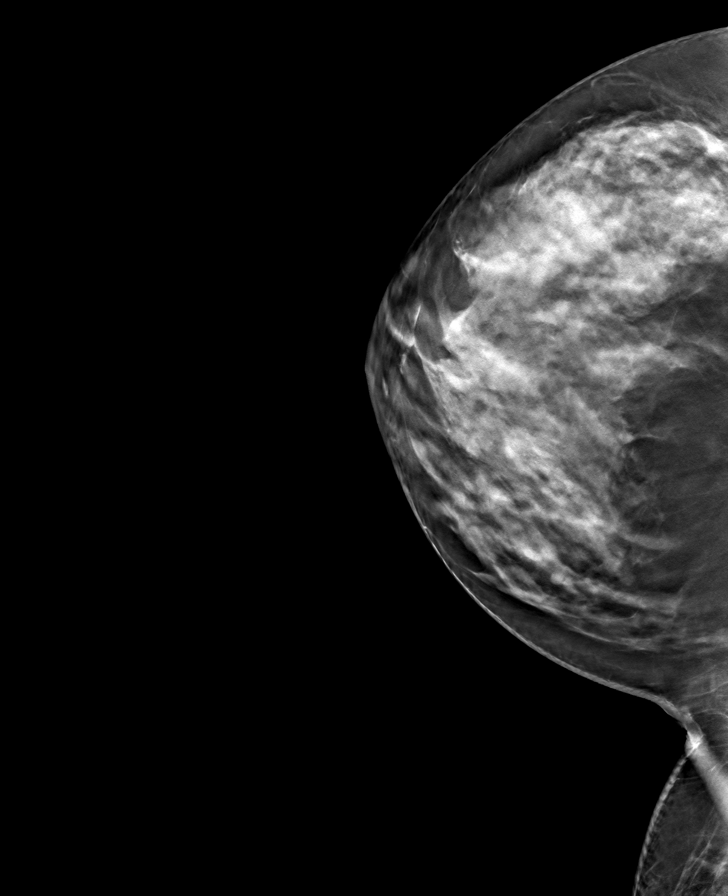

[R MLO tomo · tomo slice 44/87.0]
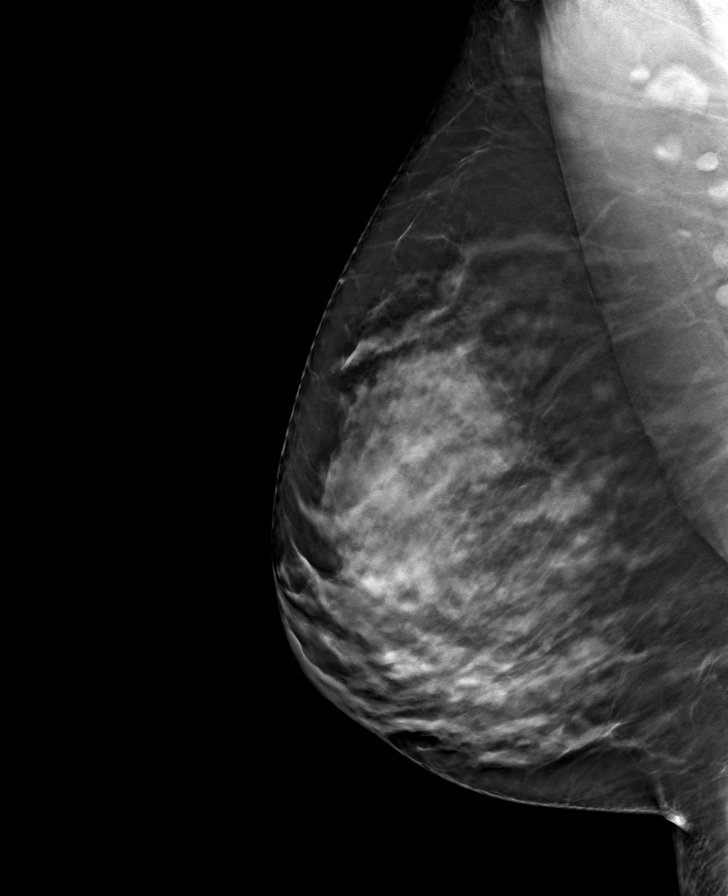

[8 of 24 positions shown; findings below may reference images not displayed]

ACR Breast Density Category c: The breast tissue is heterogeneously
dense, which may obscure small masses.
FINDINGS: In the left breast, a possible mass warrants further evaluation. In
the right breast, no findings suspicious for malignancy.

Images were processed with CAD.
IMPRESSION: Further evaluation is suggested for possible mass in the left
breast.

RECOMMENDATION:
Diagnostic mammogram and possibly ultrasound of the left breast.
(Code:[FW])

The patient will be contacted regarding the findings, and additional
imaging will be scheduled.

BI-RADS CATEGORY  0: Incomplete. Need additional imaging evaluation
and/or prior mammograms for comparison.

## 2018-07-22 LAB — CYTOLOGY - PAP: Diagnosis: NEGATIVE

## 2018-07-23 ENCOUNTER — Other Ambulatory Visit: Payer: Self-pay | Admitting: Nurse Practitioner

## 2018-07-23 DIAGNOSIS — R928 Other abnormal and inconclusive findings on diagnostic imaging of breast: Secondary | ICD-10-CM

## 2018-07-24 LAB — SPECIMEN STATUS REPORT

## 2018-07-24 LAB — HGB A1C W/O EAG: Hgb A1c MFr Bld: 6.5 % — ABNORMAL HIGH (ref 4.8–5.6)

## 2018-07-27 ENCOUNTER — Encounter: Payer: Self-pay | Admitting: Nurse Practitioner

## 2018-07-28 ENCOUNTER — Other Ambulatory Visit: Payer: Self-pay

## 2018-07-29 ENCOUNTER — Other Ambulatory Visit: Payer: Self-pay | Admitting: Nurse Practitioner

## 2018-07-29 DIAGNOSIS — G4700 Insomnia, unspecified: Secondary | ICD-10-CM

## 2018-07-29 NOTE — Telephone Encounter (Signed)
Can we verify if she is taking the ambien or the new medication Heltioz for sleep?

## 2018-07-29 NOTE — Telephone Encounter (Signed)
ambien refill  

## 2018-08-03 ENCOUNTER — Other Ambulatory Visit: Payer: Self-pay | Admitting: Nurse Practitioner

## 2018-08-03 DIAGNOSIS — G4700 Insomnia, unspecified: Secondary | ICD-10-CM

## 2018-08-03 MED ORDER — ZOLPIDEM TARTRATE 5 MG PO TABS: 5.0000 mg | ORAL_TABLET | Freq: Every evening | ORAL | 1 refills | Status: DC | PRN

## 2018-08-11 ENCOUNTER — Other Ambulatory Visit: Payer: Self-pay

## 2018-08-11 ENCOUNTER — Telehealth: Payer: Self-pay

## 2018-08-11 NOTE — Telephone Encounter (Signed)
Andres from patient assistance called asking if we were going to renew the hetlioz for pt and we notified him that we are not renewing that Rx because pt is supposed to be taking ambien. YRL,RMA

## 2018-08-27 ENCOUNTER — Other Ambulatory Visit: Payer: Self-pay

## 2018-08-27 ENCOUNTER — Ambulatory Visit
Admission: RE | Admit: 2018-08-27 | Discharge: 2018-08-27 | Disposition: A | Discharge status: Home / Self Care | Payer: BC Managed Care – PPO | Source: Ambulatory Visit | Attending: Nurse Practitioner | Admitting: Nurse Practitioner

## 2018-08-27 DIAGNOSIS — R922 Inconclusive mammogram: Secondary | ICD-10-CM | POA: Diagnosis not present

## 2018-08-27 DIAGNOSIS — R928 Other abnormal and inconclusive findings on diagnostic imaging of breast: Secondary | ICD-10-CM

## 2018-08-27 DIAGNOSIS — N6489 Other specified disorders of breast: Secondary | ICD-10-CM | POA: Diagnosis not present

## 2018-08-27 IMAGING — US ULTRASOUND LEFT BREAST LIMITED
1 series · 3 of 3 positions shown · non-contrast
Comparison: [DATE] and earlier priors

CLINICAL DATA: 52-year-old patient recalled from recent screening
mammogram for evaluation possible mass in the upper central anterior
third.

EXAM:
DIGITAL DIAGNOSTIC LEFT MAMMOGRAM WITH CAD AND TOMO
ULTRASOUND LEFT BREAST

[Series 1: ultrasound left breast limited · 0.07mm/px · 3 of 3 slices shown]
[im 1/3]
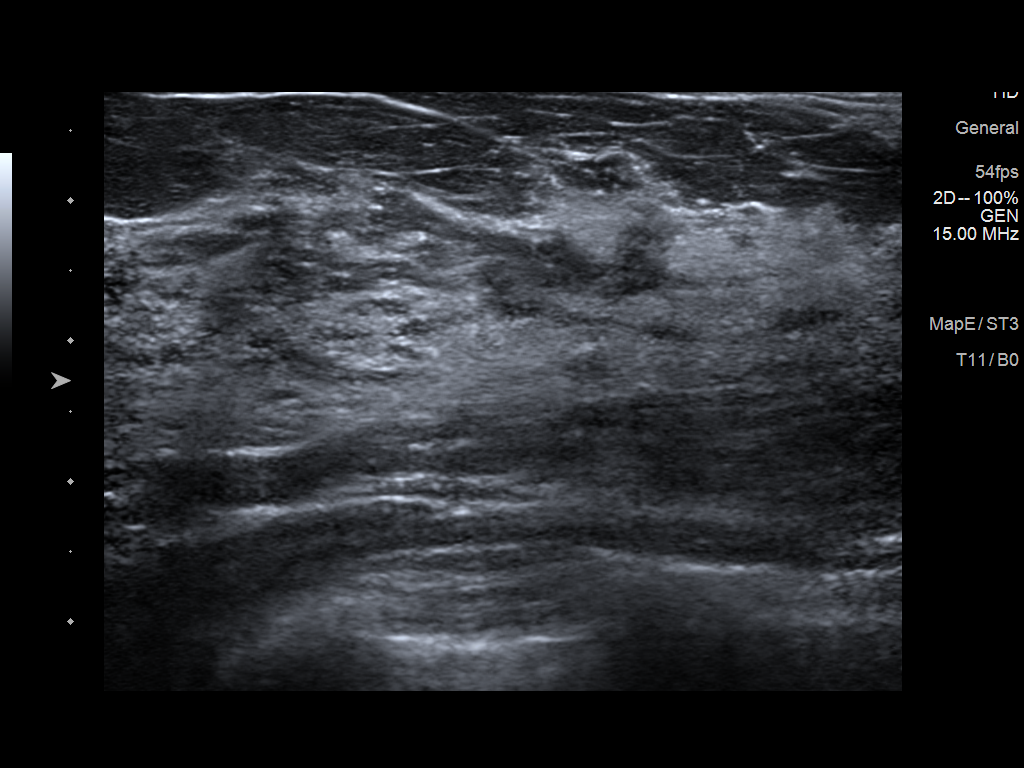
[im 2/3]
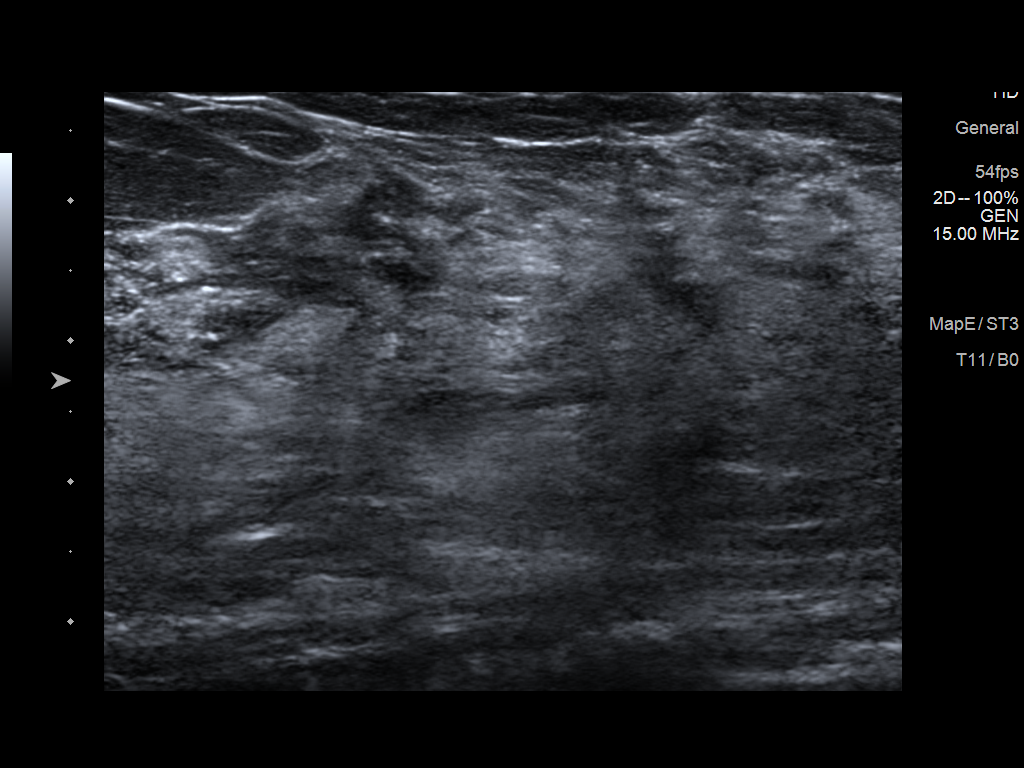
[im 3/3]
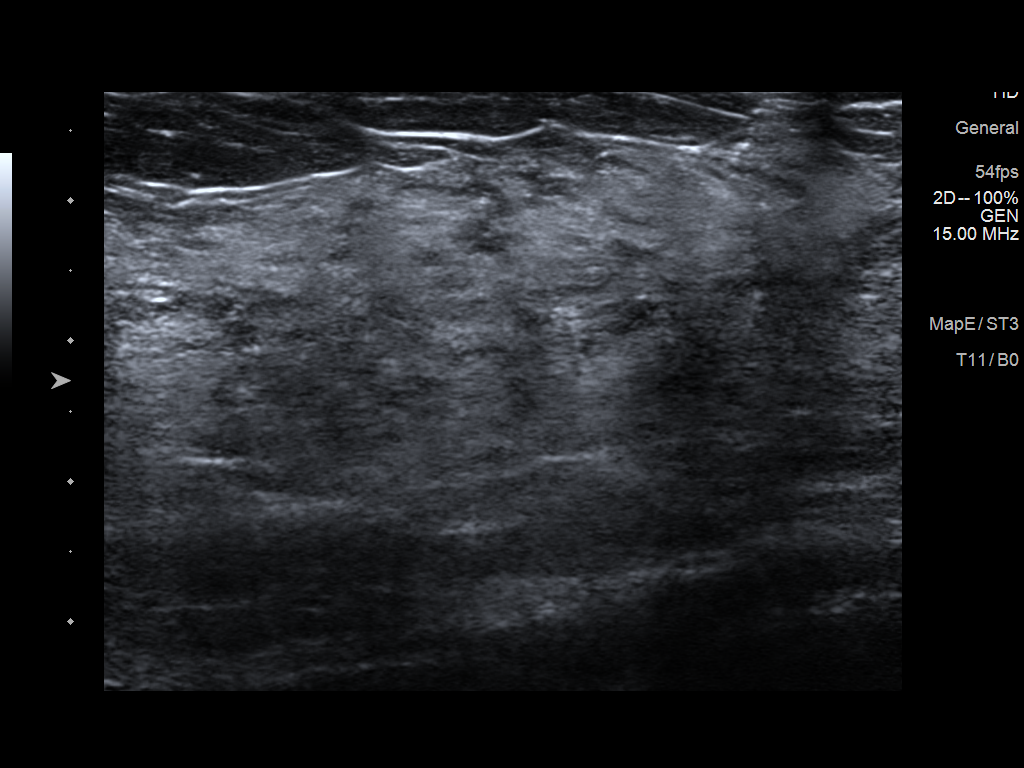

[3 of 3 positions shown; findings below may reference images not displayed]

ACR Breast Density Category c: The breast tissue is heterogeneously
dense, which may obscure small masses.
FINDINGS: Spot compression views with tomography of the upper central left
breast show dispersion of fibroglandular tissue with no definite
persistent mass. The breast tissue is dense in this region also be
evaluated with ultrasound.

Mammographic images were processed with CAD.

Targeted ultrasound is performed, showing normal dense
fibroglandular tissue upper central left breast. No solid or cystic
mass or abnormal shadowing is identified to suggest malignancy.
IMPRESSION: No evidence of malignancy in the left breast.

RECOMMENDATION:
Screening mammogram in one year.(Code:[YX])

I have discussed the findings and recommendations with the patient.
Results were also provided in writing at the conclusion of the
visit. If applicable, a reminder letter will be sent to the patient
regarding the next appointment.

BI-RADS CATEGORY  1: Negative.

## 2018-08-27 IMAGING — MG DIGITAL DIAGNOSTIC UNILATERAL LEFT MAMMOGRAM WITH TOMO AND CAD
4 series · 4 of 12 positions shown · non-contrast
Comparison: [DATE] and earlier priors

CLINICAL DATA: 52-year-old patient recalled from recent screening
mammogram for evaluation possible mass in the upper central anterior
third.

EXAM:
DIGITAL DIAGNOSTIC LEFT MAMMOGRAM WITH CAD AND TOMO
ULTRASOUND LEFT BREAST

[L MLO synth-2D]
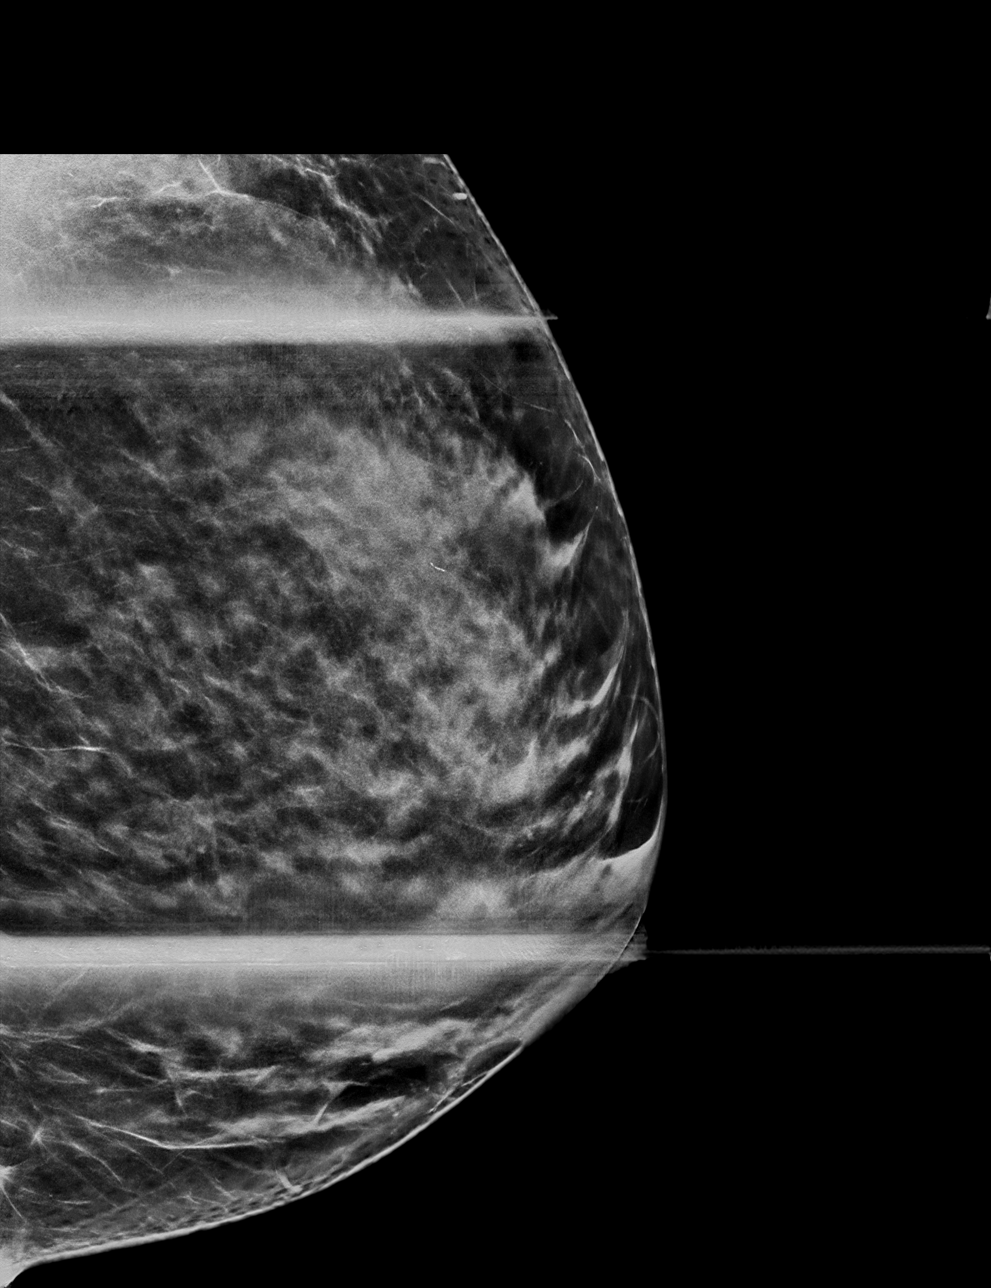

[L CC synth-2D]
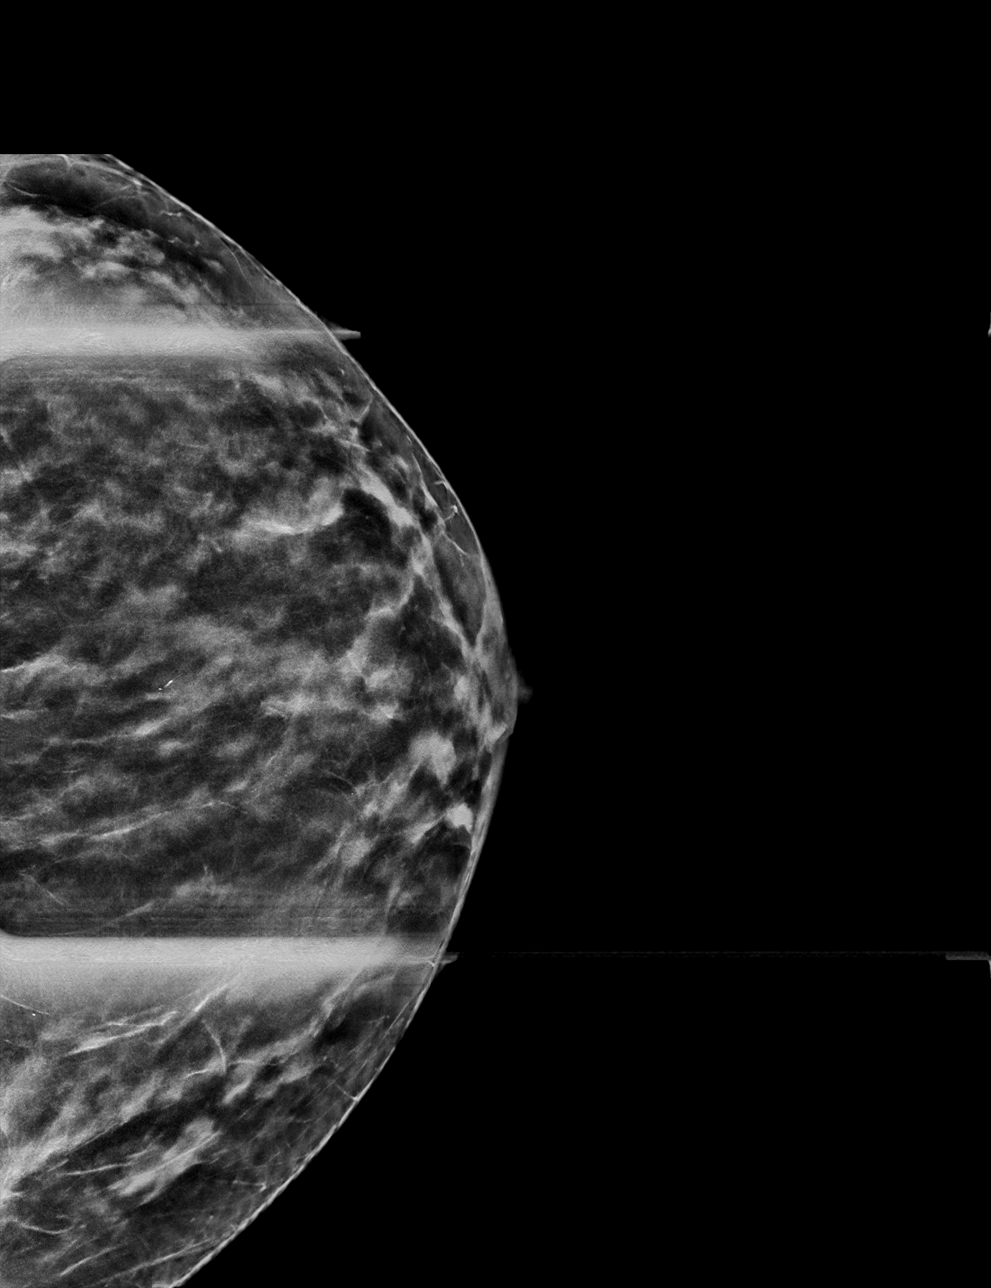

[L MLO tomo · tomo slice 36/71.0]
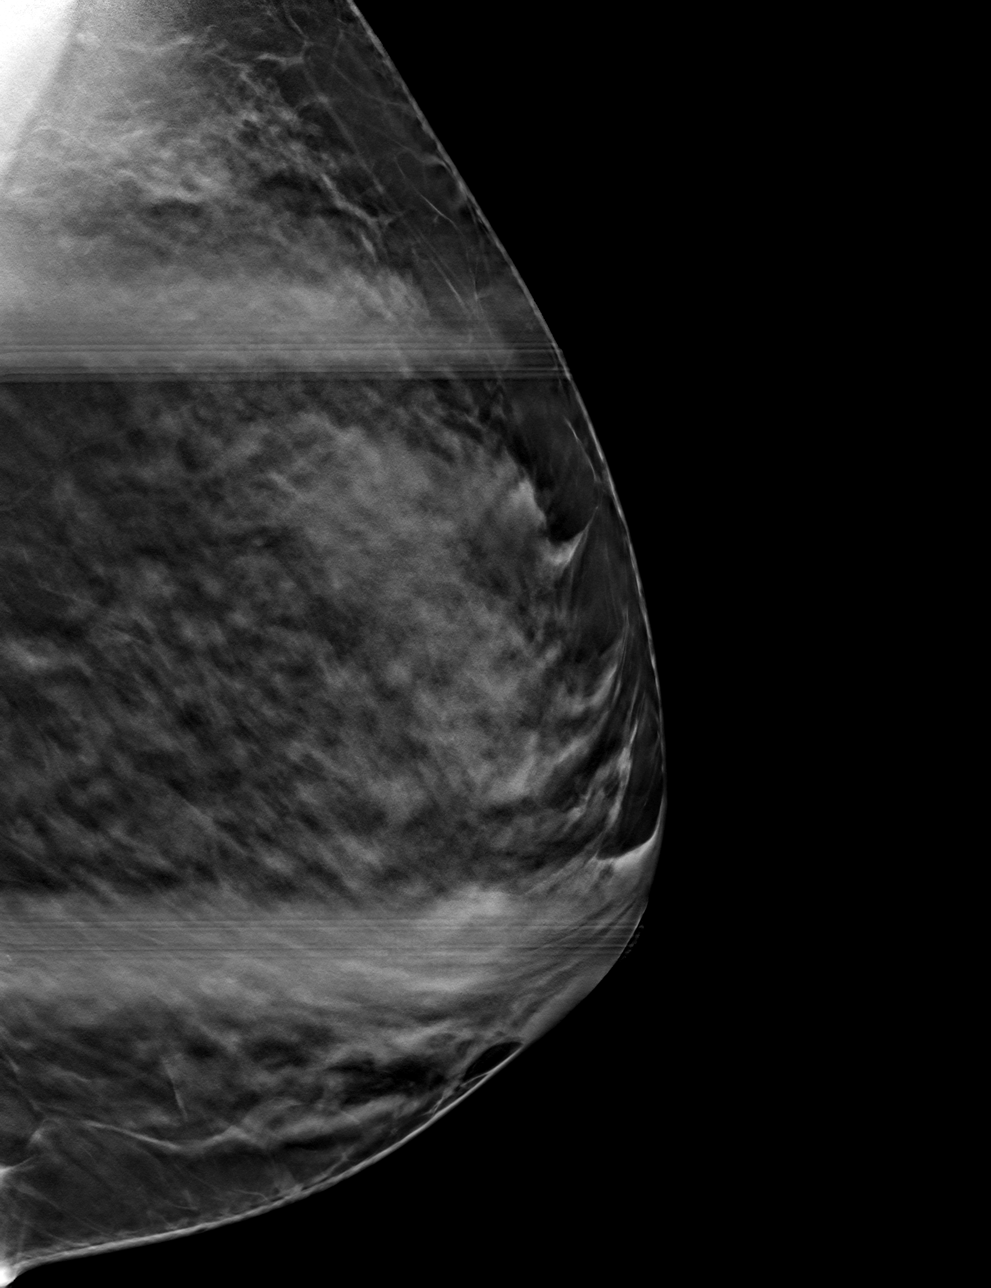

[L CC tomo · tomo slice 31/61.0]
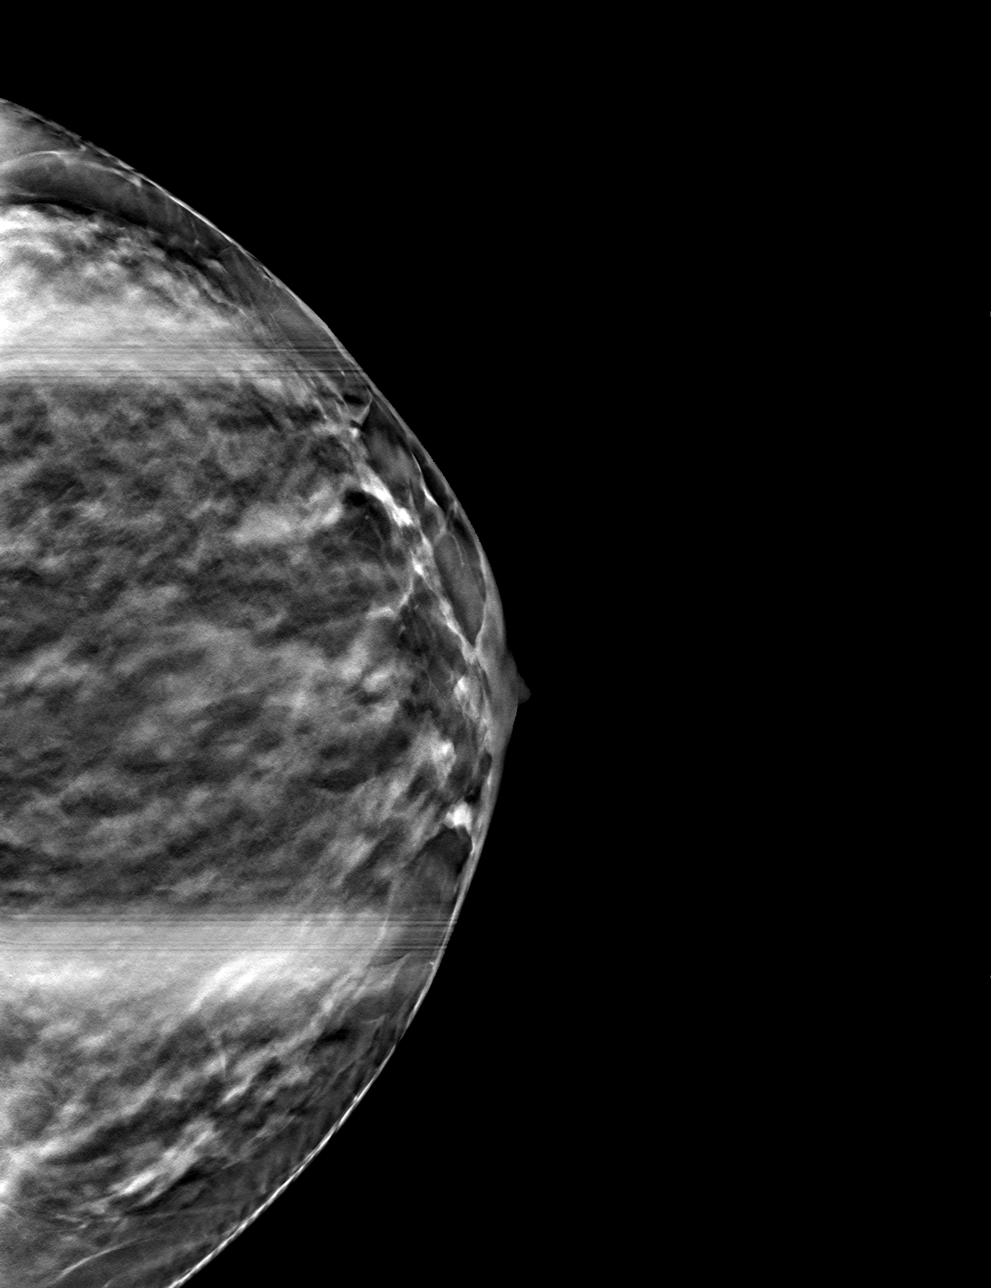

[4 of 12 positions shown; findings below may reference images not displayed]

ACR Breast Density Category c: The breast tissue is heterogeneously
dense, which may obscure small masses.
FINDINGS: Spot compression views with tomography of the upper central left
breast show dispersion of fibroglandular tissue with no definite
persistent mass. The breast tissue is dense in this region also be
evaluated with ultrasound.

Mammographic images were processed with CAD.

Targeted ultrasound is performed, showing normal dense
fibroglandular tissue upper central left breast. No solid or cystic
mass or abnormal shadowing is identified to suggest malignancy.
IMPRESSION: No evidence of malignancy in the left breast.

RECOMMENDATION:
Screening mammogram in one year.(Code:[YX])

I have discussed the findings and recommendations with the patient.
Results were also provided in writing at the conclusion of the
visit. If applicable, a reminder letter will be sent to the patient
regarding the next appointment.

BI-RADS CATEGORY  1: Negative.

## 2018-08-31 ENCOUNTER — Ambulatory Visit (INDEPENDENT_AMBULATORY_CARE_PROVIDER_SITE_OTHER): Payer: BC Managed Care – PPO | Admitting: Nurse Practitioner

## 2018-08-31 ENCOUNTER — Encounter: Payer: Self-pay | Admitting: Nurse Practitioner

## 2018-08-31 ENCOUNTER — Other Ambulatory Visit: Payer: Self-pay

## 2018-08-31 VITALS — BP 122/80 | HR 69 | Temp 98.5°F | Ht 64.0 in | Wt 177.8 lb

## 2018-08-31 DIAGNOSIS — G4700 Insomnia, unspecified: Secondary | ICD-10-CM | POA: Diagnosis not present

## 2018-08-31 MED ORDER — DAYVIGO 5 MG PO TABS: 1.0000 | ORAL_TABLET | Freq: Every evening | ORAL | 2 refills | Status: DC | PRN

## 2018-08-31 MED ORDER — DAYVIGO 5 MG PO TABS: 1.0000 | ORAL_TABLET | Freq: Every evening | ORAL | 0 refills | Status: DC | PRN

## 2018-08-31 NOTE — Progress Notes (Signed)
Subjective:     Patient ID: Vanessa Allen , female    DOB: [birth year] , 53 y.o.   MRN: [id number]   Chief Complaint  Patient presents with  . Insomnia    patient needs a new medication for sleep     HPI  She is taking the Ambien as needed.  She will fall asleep and wake up for about 30 minutes, she will toss and turn until 1 am.  She is doing progesterone cream.  She is to start estrogen patch but is hesitant with starting HRT. Had a breast mammogram with a suspicious area which was found to be okay.    Insomnia Primary symptoms: no fragmented sleep, no sleep disturbance.  The current episode started more than one month. The onset quality is gradual. The problem occurs nightly. The problem has been rapidly worsening since onset. The treatment provided no relief. PMH includes: associated symptoms present, no hypertension. Prior diagnostic workup includes:  No prior workup.     Review of Systems  Psychiatric/Behavioral: Negative for sleep disturbance. The patient has insomnia.     Past Medical History:  Diagnosis Date  . Diabetes mellitus without complication ([place])      Family History  Problem Relation Age of Onset  . Hyperlipidemia Mother   . Hypertension Mother   . Cancer Father   . Diabetes Father   . Heart disease Father   . Hypertension Brother   . Hypertension Daughter   . Breast cancer Neg Hx      Current Outpatient Medications:  .  ALPRAZolam (XANAX) 0.25 MG tablet, Take 1 tablet (0.25 mg total) by mouth 3 (three) times daily as needed for anxiety., Disp: 30 tablet, Rfl: 0 .  Continuous Blood Gluc Receiver (FREESTYLE LIBRE 14 DAY READER) DEVI, USE TO CHECK BLOOD SUGAR BEFORE MEALS AND AT BEDTIME, Disp: 1 Device, Rfl: 0 .  Continuous Blood Gluc Sensor (FREESTYLE LIBRE 14 DAY SENSOR) MISC, 1 each by Does not apply route 4 (four) times daily -  before meals and at bedtime., Disp: 2 each, Rfl: 11 .  esomeprazole (NEXIUM) 20 MG capsule, TAKE ONE CAPSULE BY MOUTH  DAILY AT LEAST 1 HOUR BEFORE A MEAL, SWALLOWING WHOLE. DO NOT CRUSH OR CHEW GRANULES, Disp: 90 capsule, Rfl: 0 .  FARXIGA 5 MG TABS tablet, TAKE 1 TABLET BY MOUTH EVERY DAY IN THE MORNING, Disp: 90 tablet, Rfl: 0 .  glucose blood (ACCU-CHEK GUIDE) test strip, 1 each by Other route as needed for other. Use as instructed, Disp: , Rfl:  .  Glucose Blood (GLUCOSE METER TEST VI), by In Vitro route 2 (two) times daily., Disp: , Rfl:  .  metFORMIN (GLUCOPHAGE) 500 MG tablet, TAKE 1 TABLET BY MOUTH EVERY DAY WITH BREAKFAST, Disp: 90 tablet, Rfl: 1 .  progesterone (PROMETRIUM) 100 MG capsule, Take 100 mg by mouth daily., Disp: , Rfl:  .  zolpidem (AMBIEN) 5 MG tablet, Take 1 tablet (5 mg total) by mouth at bedtime as needed for sleep., Disp: 30 tablet, Rfl: 1 .  fluticasone (FLONASE) 50 MCG/ACT nasal spray, as needed. , Disp: , Rfl: 0   No Known Allergies      Today's Vitals   08/31/18 0837  BP: 122/80  Pulse: 69  Temp: 98.5 F (36.9 C)  TempSrc: Oral  Weight: 177 lb 12.8 oz (80.6 kg)  Height: 5\' 4"  (1.626 m)  PainSc: 0-No pain   Body mass index is 30.52 kg/m.   Objective:  Physical Exam Vitals  signs reviewed.  Constitutional:      Appearance: Normal appearance.  Cardiovascular:     Rate and Rhythm: Normal rate and regular rhythm.     Pulses: Normal pulses.     Heart sounds: Normal heart sounds. No murmur.  Pulmonary:     Effort: Pulmonary effort is normal. No respiratory distress.     Breath sounds: Normal breath sounds.  Skin:    Capillary Refill: Capillary refill takes less than 2 seconds.  Neurological:     General: No focal deficit present.     Mental Status: She is alert and oriented to person, place, and time.  Psychiatric:        Mood and Affect: Mood normal.        Behavior: Behavior normal.        Thought Content: Thought content normal.        Judgment: Judgment normal.        Assessment And Plan:     1. Insomnia, unspecified type  She continues to have  problems with sleep  Would like to try Dayvigo, discussed side effects of daytime somnolence, headache and nightmares.    She was given a 10 day free voucher and savings card  She will return in 8 weeks for medication check with her diabetes follow up. - Lemborexant (DAYVIGO) 5 MG TABS; Take 1 tablet by mouth at bedtime as needed.  Dispense: 30 tablet; Refill: 2   [name], FNP    THE PATIENT IS ENCOURAGED TO PRACTICE SOCIAL DISTANCING DUE TO THE COVID-19 PANDEMIC.

## 2018-09-02 ENCOUNTER — Encounter: Payer: Self-pay | Admitting: Nurse Practitioner

## 2018-09-02 ENCOUNTER — Other Ambulatory Visit: Payer: Self-pay

## 2018-09-02 ENCOUNTER — Other Ambulatory Visit: Payer: Self-pay | Admitting: Nurse Practitioner

## 2018-09-02 NOTE — Telephone Encounter (Signed)
Temazepam refill 

## 2018-09-03 NOTE — Telephone Encounter (Signed)
Please send refill for metformin, nexium and farxiga need to go to the [facility] in [place], [place] on [address]

## 2018-09-04 ENCOUNTER — Other Ambulatory Visit: Payer: Self-pay | Admitting: Nurse Practitioner

## 2018-09-11 ENCOUNTER — Other Ambulatory Visit: Payer: Self-pay

## 2018-09-11 ENCOUNTER — Encounter: Payer: Self-pay | Admitting: Nurse Practitioner

## 2018-09-13 ENCOUNTER — Encounter: Payer: Self-pay | Admitting: Nurse Practitioner

## 2018-09-14 ENCOUNTER — Other Ambulatory Visit: Payer: Self-pay | Admitting: Nurse Practitioner

## 2018-09-14 DIAGNOSIS — G4700 Insomnia, unspecified: Secondary | ICD-10-CM

## 2018-09-14 MED ORDER — DAYVIGO 5 MG PO TABS: 1.0000 | ORAL_TABLET | Freq: Every evening | ORAL | 2 refills | Status: DC | PRN

## 2018-09-15 ENCOUNTER — Other Ambulatory Visit: Payer: Self-pay | Admitting: Nurse Practitioner

## 2018-09-16 ENCOUNTER — Other Ambulatory Visit: Payer: Self-pay

## 2018-09-16 MED ORDER — ESOMEPRAZOLE MAGNESIUM 20 MG PO CPDR: DELAYED_RELEASE_CAPSULE | ORAL | 0 refills | Status: DC

## 2018-09-16 MED ORDER — METFORMIN HCL 500 MG PO TABS: ORAL_TABLET | ORAL | 1 refills | Status: DC

## 2018-10-04 ENCOUNTER — Other Ambulatory Visit: Payer: Self-pay | Admitting: Nurse Practitioner

## 2018-10-21 ENCOUNTER — Ambulatory Visit: Payer: BC Managed Care – PPO | Admitting: Nurse Practitioner

## 2018-10-29 ENCOUNTER — Other Ambulatory Visit: Payer: Self-pay | Admitting: Nurse Practitioner

## 2018-10-29 DIAGNOSIS — G4700 Insomnia, unspecified: Secondary | ICD-10-CM

## 2018-10-29 NOTE — Telephone Encounter (Signed)
Requesting a refill

## 2018-11-02 ENCOUNTER — Ambulatory Visit: Payer: BC Managed Care – PPO | Admitting: Nurse Practitioner

## 2018-11-05 ENCOUNTER — Telehealth: Payer: Self-pay

## 2018-11-05 NOTE — Telephone Encounter (Signed)
Left message provider needs to know if pt is willing to take statin at least one day a week

## 2018-11-11 ENCOUNTER — Ambulatory Visit (INDEPENDENT_AMBULATORY_CARE_PROVIDER_SITE_OTHER): Payer: BC Managed Care – PPO | Admitting: Nurse Practitioner

## 2018-11-11 ENCOUNTER — Other Ambulatory Visit: Payer: Self-pay

## 2018-11-11 ENCOUNTER — Encounter: Payer: Self-pay | Admitting: Nurse Practitioner

## 2018-11-11 VITALS — BP 112/60 | HR 76 | Temp 98.9°F | Ht 66.6 in | Wt 182.4 lb

## 2018-11-11 DIAGNOSIS — G4700 Insomnia, unspecified: Secondary | ICD-10-CM | POA: Diagnosis not present

## 2018-11-11 DIAGNOSIS — E119 Type 2 diabetes mellitus without complications: Secondary | ICD-10-CM | POA: Diagnosis not present

## 2018-11-11 LAB — HEMOGLOBIN A1C
Est. average glucose Bld gHb Est-mCnc: 137 mg/dL
Hgb A1c MFr Bld: 6.4 % — ABNORMAL HIGH (ref 4.8–5.6)

## 2018-11-11 LAB — BMP8+EGFR
BUN/Creatinine Ratio: 16 (ref 9–23)
BUN: 14 mg/dL (ref 6–24)
CO2: 20 mmol/L (ref 20–29)
Calcium: 9.4 mg/dL (ref 8.7–10.2)
Chloride: 107 mmol/L — ABNORMAL HIGH (ref 96–106)
Creatinine, Ser: 0.86 mg/dL (ref 0.57–1.00)
GFR calc Af Amer: 90 mL/min/{1.73_m2} (ref >59)
GFR calc non Af Amer: 78 mL/min/{1.73_m2} (ref >59)
Glucose: 127 mg/dL — ABNORMAL HIGH (ref 65–99)
Potassium: 4.3 mmol/L (ref 3.5–5.2)
Sodium: 142 mmol/L (ref 134–144)

## 2018-11-11 MED ORDER — DAYVIGO 10 MG PO TABS: 1.0000 | ORAL_TABLET | Freq: Every evening | ORAL | 3 refills | Status: DC | PRN

## 2018-11-11 NOTE — Progress Notes (Addendum)
Subjective:     Patient ID: Vanessa Allen , female    DOB: 1965/09/07 , 53 y.o.   MRN: [id number]   Chief Complaint  Patient presents with  . Diabetes  . Insomnia    HPI   She is now able to fall asleep since being on the hormone therapy. The days she has to go to work she will wake up hourly.    Diabetes She presents for her follow-up diabetic visit. She has type 2 diabetes mellitus. Her disease course has been stable. There are no hypoglycemic associated symptoms. There are no diabetic associated symptoms. There are no hypoglycemic complications. There are no diabetic complications. Risk factors for coronary artery disease include diabetes mellitus, obesity and sedentary lifestyle. Current diabetic treatment includes oral agent (monotherapy). She is compliant with treatment all of the time. When asked about meal planning, she reported none. She rarely participates in exercise.  Insomnia Primary symptoms: no fragmented sleep, no sleep disturbance, no difficulty falling asleep.  The current episode started more than one month. The onset quality is gradual. The problem occurs nightly. The problem has been rapidly worsening since onset. The treatment provided no relief. PMH includes: associated symptoms present, no hypertension. Prior diagnostic workup includes:  No prior workup.     Review of Systems  Psychiatric/Behavioral: Negative for sleep disturbance. The patient has insomnia.     Past Medical History:  Diagnosis Date  . Diabetes mellitus without complication ([place])      Family History  Problem Relation Age of Onset  . Hyperlipidemia Mother   . Hypertension Mother   . Cancer Father   . Diabetes Father   . Heart disease Father   . Hypertension Brother   . Hypertension Daughter   . Breast cancer Neg Hx      Current Outpatient Medications:  .  ACCU-CHEK GUIDE test strip, USE TWICE DAILY TO CHECK BLOOD SUGAR BEFORE BREAKFAST AND DINNER, Disp: 100 strip, Rfl: 5 .   ALPRAZolam (XANAX) 0.25 MG tablet, Take 1 tablet (0.25 mg total) by mouth 3 (three) times daily as needed for anxiety., Disp: 30 tablet, Rfl: 0 .  Continuous Blood Gluc Receiver (FREESTYLE LIBRE 14 DAY READER) DEVI, USE TO CHECK BLOOD SUGAR BEFORE MEALS AND AT BEDTIME, Disp: 1 Device, Rfl: 0 .  Continuous Blood Gluc Sensor (FREESTYLE LIBRE 14 DAY SENSOR) MISC, 1 each by Does not apply route 4 (four) times daily -  before meals and at bedtime., Disp: 2 each, Rfl: 11 .  esomeprazole (NEXIUM) 20 MG capsule, GENERIC FOR NEXIUM. TAKE 1 CAPSULE BY MOUTH DAILY AT LEAST 1 HOUR BEFORE A MEAL. SWALLOWING WHOLE. DO NOT CRUSH OR CHEW GRANULES, Disp: 90 capsule, Rfl: 0 .  FARXIGA 5 MG TABS tablet, TAKE 1 TABLET BY MOUTH EVERY DAY IN THE MORNING, Disp: 90 tablet, Rfl: 0 .  Glucose Blood (GLUCOSE METER TEST VI), by In Vitro route 2 (two) times daily., Disp: , Rfl:  .  Lemborexant (DAYVIGO) 5 MG TABS, Take 1 tablet by mouth at bedtime as needed., Disp: 30 tablet, Rfl: 2 .  metFORMIN (GLUCOPHAGE) 500 MG tablet, TAKE 1 TABLET BY MOUTH EVERY DAY WITH BREAKFAST, Disp: 90 tablet, Rfl: 1 .  progesterone (PROMETRIUM) 100 MG capsule, TAKE 2 CAPSULES BY MOUTH EVERY NIGHT AT BEDTIME FOR 2 WEEKS, THEN 3 CAPSULES EVERY NIGHT AT BEDTIME IF NEEDED. TAKE ON DAY 1 TO 27 OF EACH MONTH, Disp: 81 capsule, Rfl: 1 .  zolpidem (AMBIEN) 5 MG tablet, Take 1 tablet (  5 mg total) by mouth at bedtime as needed for sleep., Disp: 30 tablet, Rfl: 1 .  fluticasone (FLONASE) 50 MCG/ACT nasal spray, as needed. , Disp: , Rfl: 0   No Known Allergies      Today's Vitals   11/11/18 0849  BP: 112/60  Pulse: 76  Temp: 98.9 F (37.2 C)  TempSrc: Oral  Weight: 182 lb 6.4 oz (82.7 kg)  Height: 5' 6.6" (1.692 m)  PainSc: 0-No pain   Body mass index is 28.91 kg/m.   Objective:  Physical Exam Vitals signs reviewed.  Constitutional:      Appearance: Normal appearance.  Cardiovascular:     Rate and Rhythm: Normal rate and regular rhythm.      Pulses: Normal pulses.     Heart sounds: Normal heart sounds. No murmur.  Pulmonary:     Effort: Pulmonary effort is normal. No respiratory distress.     Breath sounds: Normal breath sounds.  Skin:    Capillary Refill: Capillary refill takes less than 2 seconds.  Neurological:     General: No focal deficit present.     Mental Status: She is alert and oriented to person, place, and time.  Psychiatric:        Mood and Affect: Mood normal.        Behavior: Behavior normal.        Thought Content: Thought content normal.        Judgment: Judgment normal.        Assessment And Plan:   1. Type 2 diabetes mellitus without complication, without long-term current use of insulin (HCC)  Chronic, stable  Continue with metformin  Will check HgbA1c and GFR for kidney function - BMP8+eGFR - Hemoglobin A1c   2. Insomnia, unspecified type  She continues to have problems with sleep  Would like to try Dayvigo, discussed side effects of daytime somnolence, headache and nightmares.    She was given a 10 day free voucher and savings card  She will return in 8 weeks for medication check with her diabetes follow up. - Lemborexant (DAYVIGO) 5 MG TABS; Take 1 tablet by mouth at bedtime as needed.  Dispense: 30 tablet; Refill: 2      Janece Moore, FNP    THE PATIENT IS ENCOURAGED TO PRACTICE SOCIAL DISTANCING DUE TO THE COVID-19 PANDEMIC.    Review of Systems  Psychiatric/Behavioral: Negative for sleep disturbance. The patient has insomnia.     

## 2018-11-30 ENCOUNTER — Other Ambulatory Visit: Payer: Self-pay | Admitting: Nurse Practitioner

## 2018-12-10 ENCOUNTER — Other Ambulatory Visit: Payer: Self-pay

## 2018-12-10 MED ORDER — FARXIGA 5 MG PO TABS: ORAL_TABLET | ORAL | 0 refills | Status: DC

## 2018-12-12 ENCOUNTER — Other Ambulatory Visit: Payer: Self-pay | Admitting: Nurse Practitioner

## 2018-12-16 ENCOUNTER — Other Ambulatory Visit: Payer: Self-pay | Admitting: Nurse Practitioner

## 2018-12-19 ENCOUNTER — Other Ambulatory Visit: Payer: Self-pay | Admitting: Nurse Practitioner

## 2018-12-19 DIAGNOSIS — E119 Type 2 diabetes mellitus without complications: Secondary | ICD-10-CM

## 2018-12-30 ENCOUNTER — Other Ambulatory Visit: Payer: Self-pay

## 2018-12-30 ENCOUNTER — Encounter: Payer: Self-pay | Admitting: Nurse Practitioner

## 2018-12-30 MED ORDER — FARXIGA 10 MG PO TABS: 10.0000 mg | ORAL_TABLET | Freq: Every day | ORAL | 0 refills | Status: DC

## 2018-12-30 NOTE — Progress Notes (Signed)
arxi

## 2018-12-31 ENCOUNTER — Other Ambulatory Visit: Payer: Self-pay | Admitting: Nurse Practitioner

## 2018-12-31 ENCOUNTER — Encounter: Payer: Self-pay | Admitting: Nurse Practitioner

## 2018-12-31 DIAGNOSIS — G4700 Insomnia, unspecified: Secondary | ICD-10-CM

## 2018-12-31 MED ORDER — DAYVIGO 10 MG PO TABS: 1.0000 | ORAL_TABLET | Freq: Every evening | ORAL | 3 refills | Status: DC | PRN

## 2019-01-01 ENCOUNTER — Other Ambulatory Visit: Payer: Self-pay | Admitting: Nurse Practitioner

## 2019-01-01 DIAGNOSIS — G4700 Insomnia, unspecified: Secondary | ICD-10-CM

## 2019-01-01 NOTE — Telephone Encounter (Signed)
Please refill patient's prescription 

## 2019-01-04 ENCOUNTER — Other Ambulatory Visit: Payer: Self-pay | Admitting: General Surgery

## 2019-01-04 DIAGNOSIS — D1803 Hemangioma of intra-abdominal structures: Secondary | ICD-10-CM

## 2019-01-26 ENCOUNTER — Encounter: Payer: Self-pay | Admitting: Nurse Practitioner

## 2019-02-16 ENCOUNTER — Ambulatory Visit (INDEPENDENT_AMBULATORY_CARE_PROVIDER_SITE_OTHER): Payer: BC Managed Care – PPO | Admitting: Nurse Practitioner

## 2019-02-16 ENCOUNTER — Other Ambulatory Visit: Payer: Self-pay

## 2019-02-16 ENCOUNTER — Encounter: Payer: Self-pay | Admitting: Nurse Practitioner

## 2019-02-16 VITALS — BP 124/88 | HR 79 | Temp 98.6°F | Ht 66.6 in | Wt 185.2 lb

## 2019-02-16 DIAGNOSIS — E119 Type 2 diabetes mellitus without complications: Secondary | ICD-10-CM | POA: Diagnosis not present

## 2019-02-16 DIAGNOSIS — G4700 Insomnia, unspecified: Secondary | ICD-10-CM | POA: Diagnosis not present

## 2019-02-16 MED ORDER — BELSOMRA 10 MG PO TABS: 1.0000 | ORAL_TABLET | Freq: Every evening | ORAL | 2 refills | Status: DC | PRN

## 2019-02-16 MED ORDER — ATORVASTATIN CALCIUM 10 MG PO TABS: 10.0000 mg | ORAL_TABLET | Freq: Every day | ORAL | 2 refills | Status: DC

## 2019-02-16 MED ORDER — PROGESTERONE MICRONIZED 200 MG PO CAPS: 200.0000 mg | ORAL_CAPSULE | Freq: Every day | ORAL | 0 refills | Status: DC

## 2019-02-16 NOTE — Progress Notes (Signed)
Subjective:     Patient ID: Vanessa Allen , female    DOB: 1965-04-22 , 54 y.o.   MRN: [id number]   Chief Complaint  Patient presents with  . Diabetes  . 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     Diabetes She presents for her follow-up diabetic visit. She has type 2 diabetes mellitus. Her disease course has been stable. There are no hypoglycemic associated symptoms. There are no diabetic associated symptoms. There are no hypoglycemic complications. There are no diabetic complications. Risk factors for coronary artery disease include diabetes mellitus, obesity and sedentary lifestyle. Current diabetic treatment includes oral agent (monotherapy). She is compliant with treatment all of the time. When asked about meal planning, she reported none. She rarely participates in exercise.  Insomnia Primary symptoms: no fragmented sleep, no sleep disturbance, no difficulty falling asleep.  The current episode started more than one month. The onset quality is gradual. The problem occurs nightly. The problem has been rapidly worsening since onset. The treatment provided no relief. PMH includes: associated symptoms present, no hypertension. Prior diagnostic workup includes:  No prior workup.     Review of Systems  Psychiatric/Behavioral: Negative for sleep disturbance. The patient has insomnia.     Past Medical History:  Diagnosis Date  . Diabetes mellitus without complication ([place])      Family History  Problem Relation Age of Onset  . Hyperlipidemia Mother   . Hypertension Mother   . Cancer Father   . Diabetes Father   . Heart disease Father   . Hypertension Brother   . Hypertension Daughter   . Breast cancer Neg Hx      Current Outpatient  Medications:  .  ACCU-CHEK GUIDE test strip, USE TWICE DAILY TO CHECK BLOOD SUGAR BEFORE BREAKFAST AND DINNER, Disp: 100 strip, Rfl: 5 .  ALPRAZolam (XANAX) 0.25 MG tablet, Take 1 tablet (0.25 mg total) by mouth 3 (three) times daily as needed for anxiety., Disp: 30 tablet, Rfl: 0 .  Continuous Blood Gluc Receiver (FREESTYLE LIBRE 14 DAY READER) DEVI, USE TO CHECK BLOOD SUGAR BEFORE MEALS AND AT BEDTIME, Disp: 1 Device, Rfl: 0 .  Continuous Blood Gluc Sensor (FREESTYLE LIBRE 14 DAY SENSOR) MISC, USE TO MEASURE BLOOD GLUCOSE FOUR TIMES DAILY BEFORE MEALS AND AT BEDTIME, Disp: 2 each, Rfl: 11 .  dapagliflozin propanediol (FARXIGA) 10 MG TABS tablet, Take 10 mg by mouth daily before breakfast., Disp: 90 tablet, Rfl: 0 .  esomeprazole (NEXIUM) 20 MG capsule, GENERIC FOR NEXIUM. TAKE 1 CAPSULE BY MOUTH DAILY AT LEAST 1 HOUR BEFORE A MEAL. SWALLOWING WHOLE. DO NOT CRUSH OR CHEW GRANULES, Disp: 90 capsule, Rfl: 0 .  estradiol (VIVELLE-DOT) 0.025 MG/24HR, APPLY TO SKIN TWICE WEEKLY AS DIRECTED, Disp: 8 patch, Rfl: 1 .  fluticasone (FLONASE) 50 MCG/ACT nasal spray, as needed. , Disp: , Rfl: 0 .  Glucose Blood (GLUCOSE METER TEST VI), by In Vitro route 2 (two) times daily., Disp: , Rfl:  .  Lemborexant (DAYVIGO) 10 MG TABS, Take 1 tablet by mouth at bedtime as needed., Disp: 30 tablet, Rfl: 3 .  metFORMIN (GLUCOPHAGE) 500 MG tablet, TAKE 1 TABLET BY MOUTH EVERY DAY WITH  BREAKFAST, Disp: 90 tablet, Rfl: 1 .  progesterone (PROMETRIUM) 100 MG capsule, TAKE 2 CAPSULES BY MOUTH EVERY NIGHT AT BEDTIME FOR 2 WEEKS, THEN 3 CAPSULES EVERY NIGHT AT BEDTIME IF NEEDED. TAKE ON DAY 1 TO 27 OF EACH MONTH, Disp: 81 capsule, Rfl: 1 .  zolpidem (AMBIEN) 5 MG tablet, Take 1 tablet (5 mg total) by mouth at bedtime as needed for sleep., Disp: 30 tablet, Rfl: 1 .  Lemborexant (DAYVIGO) 5 MG TABS, Take 1 tablet by mouth at bedtime as needed. (Patient not taking: Reported on 02/16/2019), Disp: 30 tablet, Rfl: 2   No Known Allergies       Today's Vitals   02/16/19 0920  BP: 124/88  Pulse: 79  Temp: 98.6 F (37 C)  TempSrc: Oral  Weight: 185 lb 3.2 oz (84 kg)  Height: 5' 6.6" (1.692 m)  PainSc: 0-No pain   Body mass index is 29.36 kg/m.   Objective:  Physical Exam Vitals reviewed.  Constitutional:      Appearance: Normal appearance.  Cardiovascular:     Rate and Rhythm: Normal rate and regular rhythm.     Pulses: Normal pulses.     Heart sounds: Normal heart sounds. No murmur.  Pulmonary:     Effort: Pulmonary effort is normal. No respiratory distress.     Breath sounds: Normal breath sounds.  Skin:    Capillary Refill: Capillary refill takes less than 2 seconds.  Neurological:     General: No focal deficit present.     Mental Status: She is alert and oriented to person, place, and time.  Psychiatric:        Mood and Affect: Mood normal.        Behavior: Behavior normal.        Thought Content: Thought content normal.        Judgment: Judgment normal.        Assessment And Plan:   1. Type 2 diabetes mellitus without complication, without long-term current use of insulin ([facility])  Chronic, stable  Continue with metformin  Will check HgbA1c and GFR for kidney function - BMP8+eGFR - Hemoglobin A1c   2. Insomnia, unspecified type  She continues to have problems with sleep getting a little more than usual  Would like to try belsomra, samples given discussed side effects of vivid dreams   If effective will send prescription      [name], FNP    THE PATIENT IS ENCOURAGED TO PRACTICE SOCIAL DISTANCING DUE TO THE COVID-19 PANDEMIC.    Review of Systems  Psychiatric/Behavioral: Negative for sleep disturbance. The patient has insomnia.

## 2019-02-17 ENCOUNTER — Ambulatory Visit
Admission: RE | Admit: 2019-02-17 | Discharge: 2019-02-17 | Disposition: A | Discharge status: Home / Self Care | Payer: BC Managed Care – PPO | Source: Ambulatory Visit | Attending: General Surgery | Admitting: General Surgery

## 2019-02-17 ENCOUNTER — Encounter: Payer: Self-pay | Admitting: Nurse Practitioner

## 2019-02-17 DIAGNOSIS — D1803 Hemangioma of intra-abdominal structures: Secondary | ICD-10-CM

## 2019-02-17 DIAGNOSIS — D1809 Hemangioma of other sites: Secondary | ICD-10-CM | POA: Diagnosis not present

## 2019-02-17 LAB — CMP14+EGFR
ALT: 22 IU/L (ref 0–32)
AST: 22 IU/L (ref 0–40)
Albumin/Globulin Ratio: 1.8 (ref 1.2–2.2)
Albumin: 4.4 g/dL (ref 3.8–4.9)
Alkaline Phosphatase: 86 IU/L (ref 39–117)
BUN/Creatinine Ratio: 16 (ref 9–23)
BUN: 14 mg/dL (ref 6–24)
Bilirubin Total: 0.4 mg/dL (ref 0.0–1.2)
CO2: 22 mmol/L (ref 20–29)
Calcium: 9.5 mg/dL (ref 8.7–10.2)
Chloride: 106 mmol/L (ref 96–106)
Creatinine, Ser: 0.89 mg/dL (ref 0.57–1.00)
GFR calc Af Amer: 87 mL/min/{1.73_m2} (ref >59)
GFR calc non Af Amer: 75 mL/min/{1.73_m2} (ref >59)
Globulin, Total: 2.4 g/dL (ref 1.5–4.5)
Glucose: 115 mg/dL — ABNORMAL HIGH (ref 65–99)
Potassium: 4.1 mmol/L (ref 3.5–5.2)
Sodium: 143 mmol/L (ref 134–144)
Total Protein: 6.8 g/dL (ref 6.0–8.5)

## 2019-02-17 LAB — HEMOGLOBIN A1C
Est. average glucose Bld gHb Est-mCnc: 131 mg/dL
Hgb A1c MFr Bld: 6.2 % — ABNORMAL HIGH (ref 4.8–5.6)

## 2019-02-17 IMAGING — US US ABDOMEN LIMITED
1 series · 13 of 25 positions shown · non-contrast
Comparison: CT abdomen and pelvis [DATE]

CLINICAL DATA: Known liver hemangioma

EXAM:
ULTRASOUND ABDOMEN LIMITED RIGHT UPPER QUADRANT

[Series 1: us abdomen limited · 0.22mm/px · 13 of 35 slices shown]
[im 1/35]
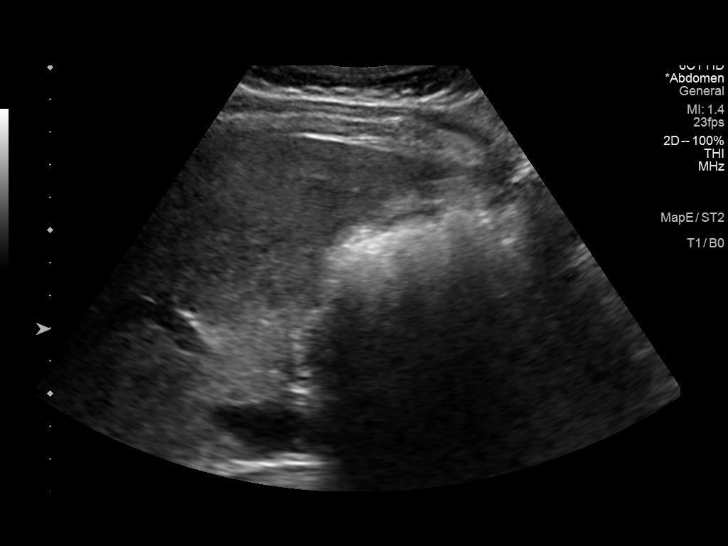
[im 3/35]
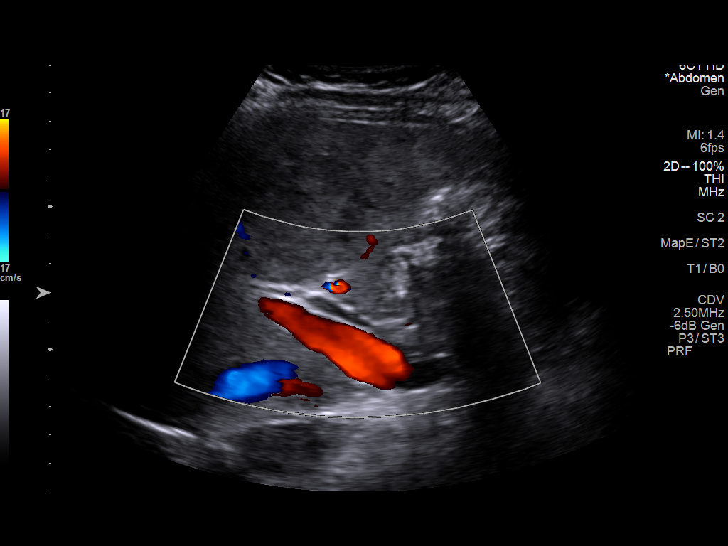
[im 6/35]
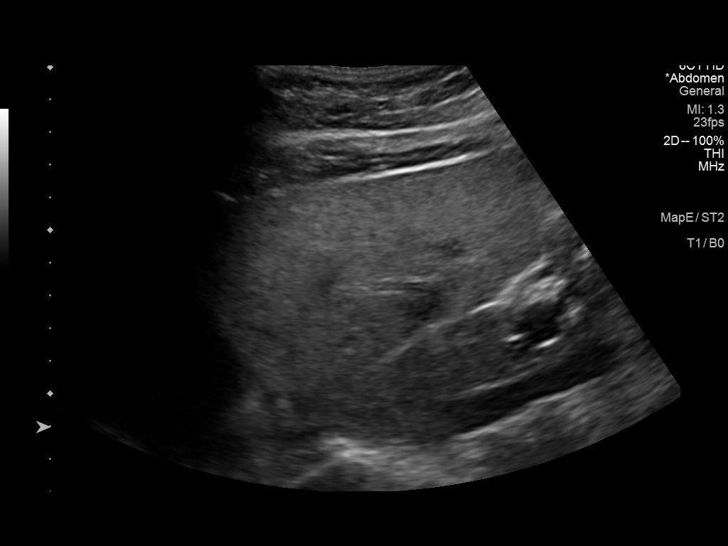
[im 9/35]
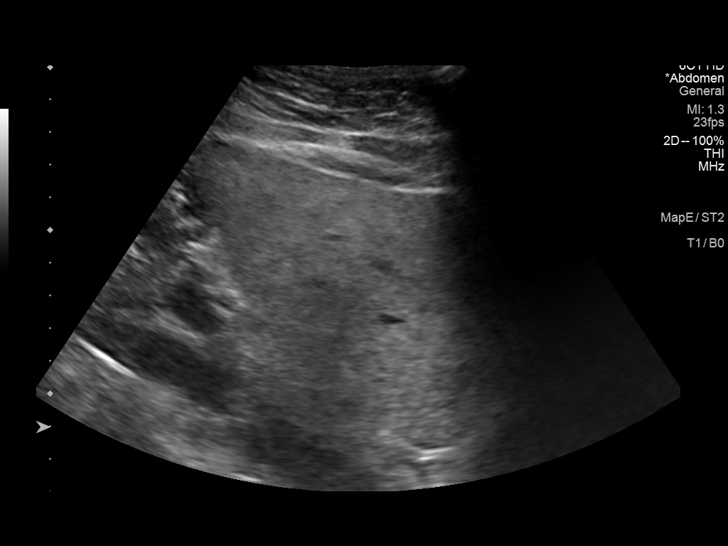
[im 12/35]
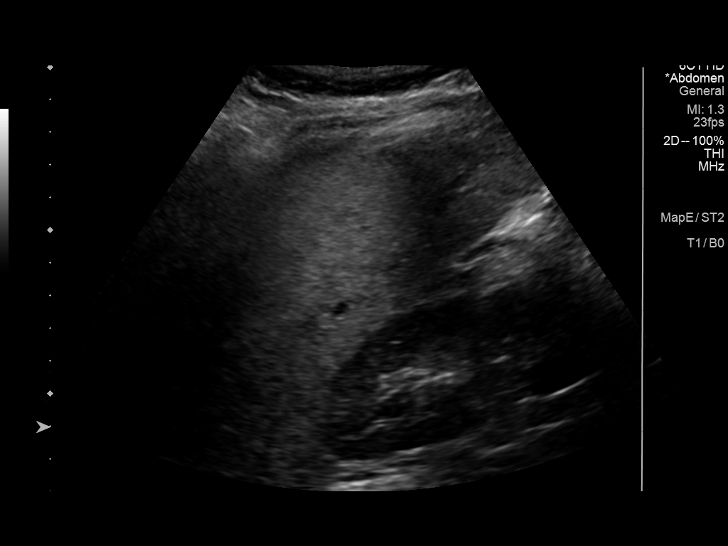
[im 15/35]
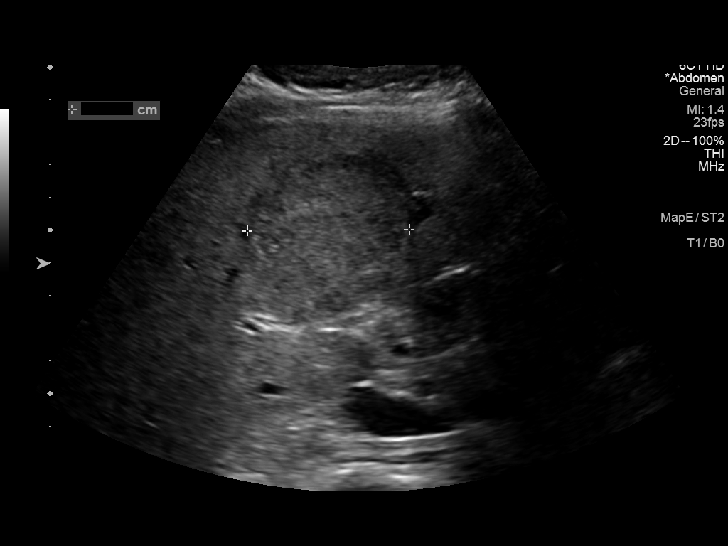
[im 18/35]
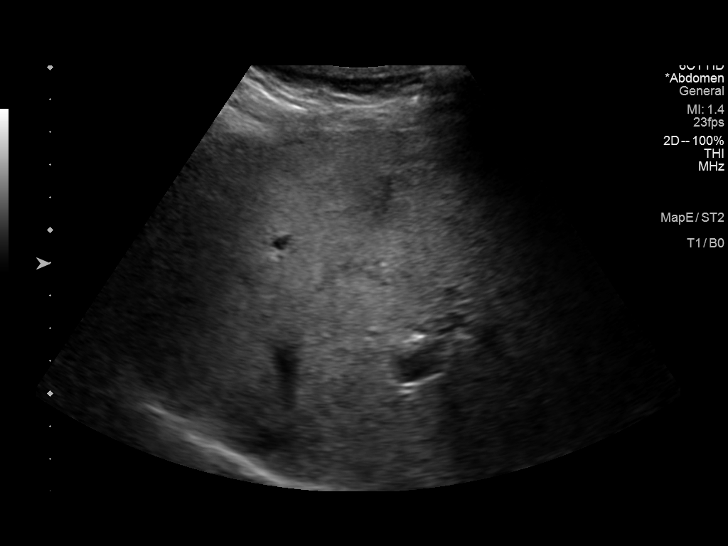
[im 20/35]
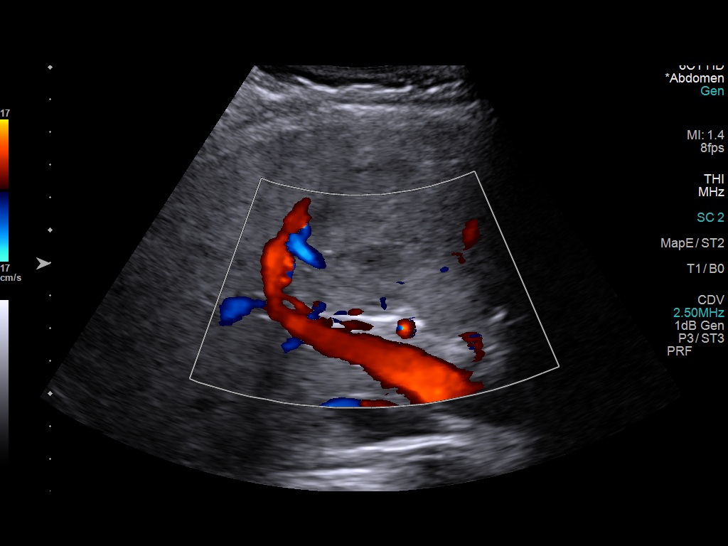
[im 23/35]
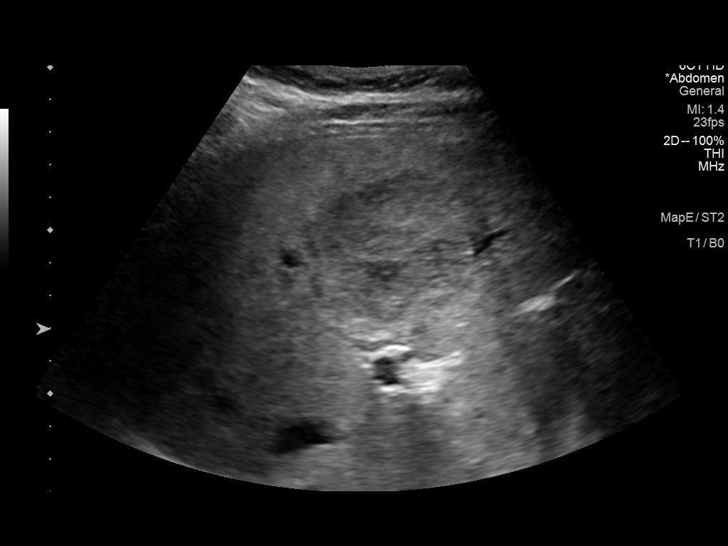
[im 26/35]
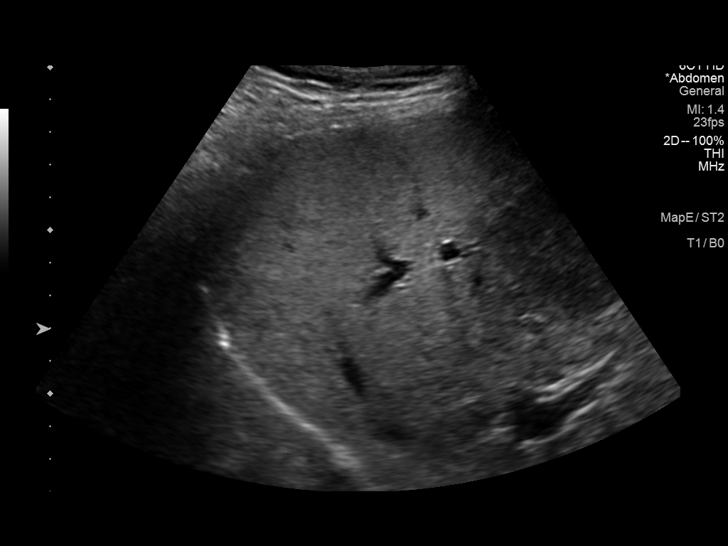
[im 29/35]
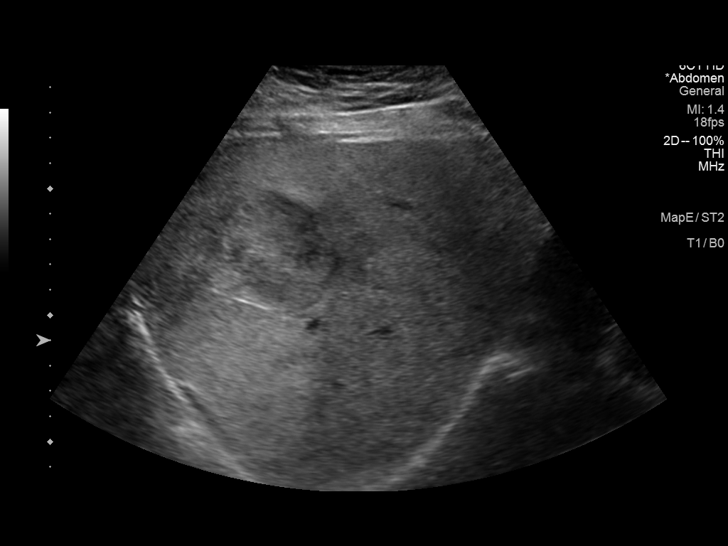
[im 32/35]
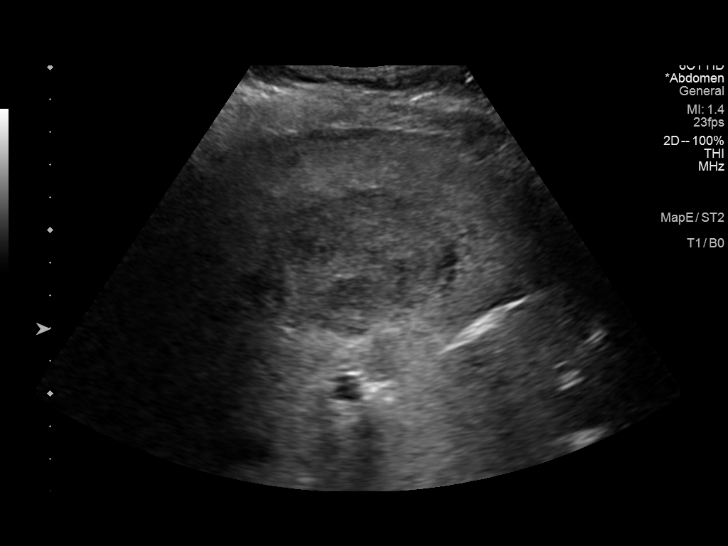
[im 35/35]
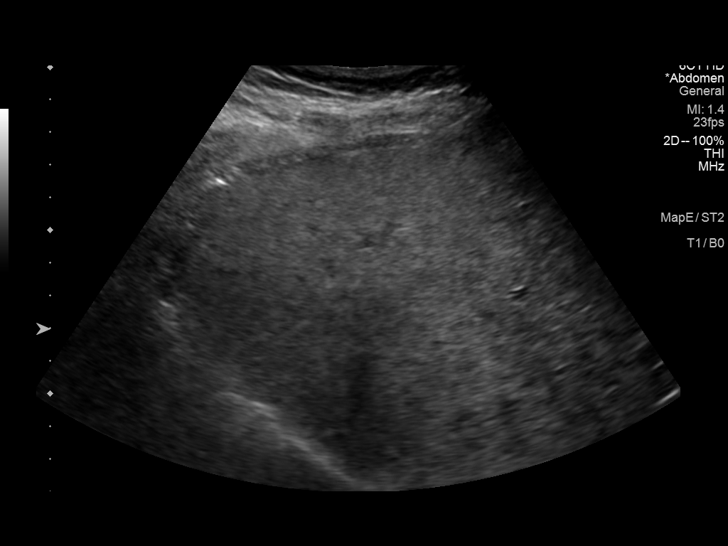

[13 of 25 positions shown; findings below may reference images not displayed]

FINDINGS: Gallbladder:

Surgically absent.

Common bile duct:

Diameter: 5 mm. No intrahepatic or extrahepatic biliary duct
dilatation.

Liver:

There is a solid somewhat inhomogeneous mass arising in the right
lobe of the liver measuring 5.0 x 4.9 x 4.8 cm. Previous CT
examination of this area demonstrated a focal hemangioma in this
area. This hemangioma may be marginally larger at this time compared
to previous study. No other focal liver lesions are evident. Note
that the overall liver echogenicity is increased. Portal vein is
patent on color Doppler imaging with normal direction of blood flow
towards the liver.

Other: None.
IMPRESSION: 1. Previously documented hemangioma in the right lobe of the liver
is noted by ultrasound. This hemangioma may be marginally larger at
this time compared to previous study. This apparent hemangioma in
the right lobe of the liver does not have classic ultrasound
features for hemangioma. The features on previous CT are typical of
hemangioma. No new liver lesions are evident.

2. Overall increase in liver echogenicity, a finding felt to be
indicative of hepatic steatosis.

3.  Gallbladder absent.

## 2019-02-19 ENCOUNTER — Other Ambulatory Visit: Payer: Self-pay | Admitting: Nurse Practitioner

## 2019-03-04 ENCOUNTER — Other Ambulatory Visit: Payer: Self-pay | Admitting: Nurse Practitioner

## 2019-03-04 DIAGNOSIS — G4700 Insomnia, unspecified: Secondary | ICD-10-CM

## 2019-03-27 ENCOUNTER — Encounter: Payer: Self-pay | Admitting: Nurse Practitioner

## 2019-03-29 ENCOUNTER — Other Ambulatory Visit: Payer: Self-pay

## 2019-03-29 ENCOUNTER — Other Ambulatory Visit: Payer: Self-pay | Admitting: Nurse Practitioner

## 2019-03-29 DIAGNOSIS — G4700 Insomnia, unspecified: Secondary | ICD-10-CM

## 2019-03-29 MED ORDER — BELSOMRA 10 MG PO TABS: 1.0000 | ORAL_TABLET | Freq: Every evening | ORAL | 2 refills | Status: DC | PRN

## 2019-04-12 ENCOUNTER — Ambulatory Visit (INDEPENDENT_AMBULATORY_CARE_PROVIDER_SITE_OTHER): Payer: BC Managed Care – PPO | Admitting: Nurse Practitioner

## 2019-04-12 ENCOUNTER — Other Ambulatory Visit: Payer: Self-pay

## 2019-04-12 ENCOUNTER — Encounter: Payer: Self-pay | Admitting: Nurse Practitioner

## 2019-04-12 VITALS — BP 118/76 | HR 74 | Temp 97.8°F | Ht 66.6 in | Wt 178.0 lb

## 2019-04-12 DIAGNOSIS — G4700 Insomnia, unspecified: Secondary | ICD-10-CM

## 2019-04-12 DIAGNOSIS — M67432 Ganglion, left wrist: Secondary | ICD-10-CM | POA: Diagnosis not present

## 2019-04-12 NOTE — Progress Notes (Signed)
This visit occurred during the SARS-CoV-2 public health emergency.  Safety protocols were in place, including screening questions prior to the visit, additional usage of staff PPE, and extensive cleaning of exam room while observing appropriate contact time as indicated for disinfecting solutions.  Subjective:     Patient ID: Vanessa Allen , female    DOB: 06-14-1965 , 54 y.o.   MRN: [id number]   Chief Complaint  Patient presents with  . Insomnia    HPI  No side effects from taking the Belsomra. She has not tried magnesium. She had a home sleep study 2 years ago.  She admits to not giving the medications enough time to work.  She has not picked up Rx for Belsomra since January.  She did notice a huge difference.  She is not using any of the medications consistently.    Insomnia Primary symptoms: no sleep disturbance.  The onset quality is gradual. The problem occurs nightly. Past treatments include medication (belsomra - has not seen significant difference but not taking consistently). The treatment provided no relief. Prior workup: she had a home sleep study was not approved for sleep study in person.     Past Medical History:  Diagnosis Date  . Diabetes mellitus without complication ([place] [facility])      Family History  Problem Relation Age of Onset  . Hyperlipidemia Mother   . Hypertension Mother   . Cancer Father   . Diabetes Father   . Heart disease Father   . Hypertension Brother   . Hypertension Daughter   . Breast cancer Neg Hx      Current Outpatient Medications:  .  ACCU-CHEK GUIDE test strip, USE TWICE DAILY TO CHECK BLOOD SUGAR BEFORE BREAKFAST AND DINNER, Disp: 100 strip, Rfl: 5 .  ALPRAZolam (XANAX) 0.25 MG tablet, Take 1 tablet (0.25 mg total) by mouth 3 (three) times daily as needed for anxiety., Disp: 30 tablet, Rfl: 0 .  atorvastatin (LIPITOR) 10 MG tablet, Take 1 tablet (10 mg total) by mouth daily., Disp: 30 tablet, Rfl: 2 .  Continuous Blood Gluc Receiver  (FREESTYLE LIBRE 14 DAY READER) DEVI, USE TO CHECK BLOOD SUGAR BEFORE MEALS AND AT BEDTIME, Disp: 1 Device, Rfl: 0 .  Continuous Blood Gluc Sensor (FREESTYLE LIBRE 14 DAY SENSOR) MISC, USE TO MEASURE BLOOD GLUCOSE FOUR TIMES DAILY BEFORE MEALS AND AT BEDTIME, Disp: 2 each, Rfl: 11 .  dapagliflozin propanediol (FARXIGA) 10 MG TABS tablet, Take 10 mg by mouth daily before breakfast., Disp: 90 tablet, Rfl: 0 .  esomeprazole (NEXIUM) 20 MG capsule, GENERIC FOR NEXIUM. TAKE 1 CAPSULE BY MOUTH DAILY AT LEAST 1 HOUR BEFORE A MEAL. SWALLOWING WHOLE. DO NOT CRUSH OR CHEW GRANULES, Disp: 90 capsule, Rfl: 0 .  estradiol (VIVELLE-DOT) 0.025 MG/24HR, APPLY TO SKIN TWICE WEEKLY AS DIRECTED, Disp: 8 patch, Rfl: 1 .  fluticasone (FLONASE) 50 MCG/ACT nasal spray, as needed. , Disp: , Rfl: 0 .  Glucose Blood (GLUCOSE METER TEST VI), by In Vitro route 2 (two) times daily., Disp: , Rfl:  .  Lemborexant (DAYVIGO) 10 MG TABS, Take 1 tablet by mouth at bedtime as needed., Disp: 30 tablet, Rfl: 3 .  Lemborexant (DAYVIGO) 5 MG TABS, Take 1 tablet by mouth at bedtime as needed., Disp: 30 tablet, Rfl: 2 .  metFORMIN (GLUCOPHAGE) 500 MG tablet, TAKE 1 TABLET BY MOUTH EVERY DAY WITH BREAKFAST, Disp: 90 tablet, Rfl: 1 .  progesterone (PROMETRIUM) 100 MG capsule, TAKE 2 CAPSULES BY MOUTH EVERY NIGHT AT BEDTIME  FOR 2 WEEKS, THEN 3 CAPSULES EVERY NIGHT AT BEDTIME IF NEEDED. TAKE ON DAY 1 TO 27 OF EACH MONTH, Disp: 81 capsule, Rfl: 1 .  Suvorexant (BELSOMRA) 10 MG TABS, Take 1 tablet by mouth at bedtime as needed., Disp: 30 tablet, Rfl: 2 .  zolpidem (AMBIEN) 5 MG tablet, Take 1 tablet (5 mg total) by mouth at bedtime as needed for sleep., Disp: 30 tablet, Rfl: 1   No Known Allergies   Review of Systems  Constitutional: Negative.  Negative for fatigue.  Respiratory: Negative.  Negative for cough.   Cardiovascular: Negative.  Negative for chest pain, palpitations and leg swelling.  Endocrine: Negative for polydipsia, polyphagia  and polyuria.  Neurological: Negative for dizziness and headaches.  Psychiatric/Behavioral: Negative for agitation and sleep disturbance. The patient has insomnia.      Today's Vitals   04/12/19 0853  BP: 118/76  Pulse: 74  Temp: 97.8 F (36.6 C)  TempSrc: Oral  SpO2: 97%  Weight: 178 lb (80.7 kg)  Height: 5' 6.6" (1.692 m)   Body mass index is 28.21 kg/m.   Objective:  Physical Exam Vitals reviewed.  Constitutional:      Appearance: Normal appearance.  Cardiovascular:     Rate and Rhythm: Normal rate and regular rhythm.     Pulses: Normal pulses.     Heart sounds: Normal heart sounds. No murmur.  Pulmonary:     Effort: Pulmonary effort is normal. No respiratory distress.     Breath sounds: Normal breath sounds.  Skin:    Capillary Refill: Capillary refill takes less than 2 seconds.  Neurological:     General: No focal deficit present.     Mental Status: She is alert and oriented to person, place, and time.     Cranial Nerves: No cranial nerve deficit.  Psychiatric:        Mood and Affect: Mood normal.        Behavior: Behavior normal.        Thought Content: Thought content normal.        Judgment: Judgment normal.         Assessment And Plan:     1. Insomnia, unspecified type  Advised to continue with Belsomra   Will also order another sleep study in hopes her insurance will cover an in person sleep study - Ambulatory referral to Sleep Studies  2. Ganglion cyst of wrist, left  She has a ganglion cyst to left wrist not very mobile  Will refer to plastics to have evaluated.  - Ambulatory referral to Plastic Surgery   [name], FNP    THE PATIENT IS ENCOURAGED TO PRACTICE SOCIAL DISTANCING DUE TO THE COVID-19 PANDEMIC.

## 2019-04-13 ENCOUNTER — Ambulatory Visit: Payer: BC Managed Care – PPO | Admitting: Nurse Practitioner

## 2019-04-19 ENCOUNTER — Other Ambulatory Visit: Payer: Self-pay | Admitting: Nurse Practitioner

## 2019-05-13 ENCOUNTER — Other Ambulatory Visit: Payer: Self-pay | Admitting: Nurse Practitioner

## 2019-05-13 DIAGNOSIS — G4700 Insomnia, unspecified: Secondary | ICD-10-CM

## 2019-05-14 ENCOUNTER — Other Ambulatory Visit: Payer: Self-pay | Admitting: Nurse Practitioner

## 2019-05-14 DIAGNOSIS — E119 Type 2 diabetes mellitus without complications: Secondary | ICD-10-CM

## 2019-05-15 ENCOUNTER — Other Ambulatory Visit: Payer: Self-pay | Admitting: Nurse Practitioner

## 2019-06-01 ENCOUNTER — Encounter: Payer: Self-pay | Admitting: Nurse Practitioner

## 2019-06-02 ENCOUNTER — Other Ambulatory Visit: Payer: Self-pay

## 2019-06-02 MED ORDER — METFORMIN HCL 500 MG PO TABS: ORAL_TABLET | ORAL | 1 refills | Status: DC

## 2019-06-07 ENCOUNTER — Encounter: Payer: Self-pay | Admitting: Nurse Practitioner

## 2019-06-07 ENCOUNTER — Other Ambulatory Visit: Payer: Self-pay | Admitting: Nurse Practitioner

## 2019-06-07 DIAGNOSIS — G4700 Insomnia, unspecified: Secondary | ICD-10-CM

## 2019-06-08 ENCOUNTER — Ambulatory Visit (INDEPENDENT_AMBULATORY_CARE_PROVIDER_SITE_OTHER): Payer: BC Managed Care – PPO | Admitting: Neurology

## 2019-06-08 ENCOUNTER — Other Ambulatory Visit: Payer: Self-pay | Admitting: Neurology

## 2019-06-08 ENCOUNTER — Institutional Professional Consult (permissible substitution): Payer: BC Managed Care – PPO | Admitting: Plastic Surgery

## 2019-06-08 ENCOUNTER — Encounter: Payer: Self-pay | Admitting: Neurology

## 2019-06-08 ENCOUNTER — Other Ambulatory Visit: Payer: Self-pay

## 2019-06-08 DIAGNOSIS — G4700 Insomnia, unspecified: Secondary | ICD-10-CM | POA: Diagnosis not present

## 2019-06-08 DIAGNOSIS — F5104 Psychophysiologic insomnia: Secondary | ICD-10-CM | POA: Diagnosis not present

## 2019-06-08 DIAGNOSIS — G4726 Circadian rhythm sleep disorder, shift work type: Secondary | ICD-10-CM | POA: Diagnosis not present

## 2019-06-08 MED ORDER — BELSOMRA 10 MG PO TABS: 1.0000 | ORAL_TABLET | Freq: Every evening | ORAL | 0 refills | Status: DC | PRN

## 2019-06-08 NOTE — Progress Notes (Signed)
SLEEP MEDICINE CLINIC   Provider:  Larey Seat, M.D.   Primary Care Physician:  [name], FNP  Referring Provider: [name], FNP    HPI:  Vanessa Allen is a 54 y.o. female patient ,  revisit on 06-08-2019. The patient has been seen in a consultation in September 2019 a home sleep test had been obtained which was on an apnea link at the time, it showed only an oxygen desaturation nadir of 83% SPO2 50 minutes of desaturation time for the total night, and average heart rate of 70 bpm.  The device cannot tell me as the heart rate is regular or not.  There was no evidence of sleep apnea with an AHI of 0.7.  The patient had to however significant fatigue and felt that there were other organic causes to her insomnia.  Unfortunately an attended sleep study had first been denied, based on what she is telling me I would think that she does need to have an attended sleep study.  She has worked night shifts in the past which could contribute to a circadian rhythm disorder in her very first visit with me Vanessa Allen told me that she can just not sleep and that this started rather abruptly about 5-1/2 years prior to our visit now 18 months later this may have even progressed.  Her chief complaint remains insomnia and explained without any correlating medical events at the onset.  She has more fatigue after days at work, especially consecutive days. "The more tired I am the harder it is to go to sleep- I toss and turn for hours" still uses a sleep mask, bedroom is cool and quiet.  She has established a comfortable environment but can't sleep and if she goes to sleep she wakes right up again. Traveling nurse. ED nurse. Works in [place], but stated that time zone changes have not affected her.        First time seen here on 10-27-2017  in a referral from [place] for cyclic insomnia, problems to initiate and maintain sleep. Chief complaint according to patient : " I cant sleep ' -  this started 5 years or more ago-no medication, medical condition, trauma or surgery started this".    At the pleasure of meeting Vanessa Allen on 01-2017 , who has had problems to initiate and maintain sleep for several years now without being aware of a trigger for this development.  She is not excessively daytime sleepy, on the contrary she could not nap when she wanted to.  She endorsed the Epworth Sleepiness Scale at 0 points, the fatigue severity however at 49 out of 63 points.  She has undergone a cholecystectomy, a myomectomy and has been diagnosed with diabetes and tendinitis, she frequently has sinus infections, and she is a shift Insurance underwriter.  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  Sleep habits are as follows: She does not have a set dinnertime, but usually cannot eat at work, as she works in the emergency room. This very busy workplace does not allow for regular breaks.  She commutes about 35 minutes from any pending hospital in [place] to [place].  Dinner not cannot be before 8 unless she grabs fast food, which happens a lot of times.  She will be home after 830, takes a shower, watching TV.  Sometimes she will wake up hourly sometimes she will not go to sleep at all.  She estimates that  on average she is not asleep before 11 PM.  She keeps her bedroom cool, quiet and dark, if the bedroom is not dark enough she will use a sleep mask.  She does like to have the TV on in the background but uses a sleep mask to eliminate screen night.  She tosses and turns a lot, she cannot remember dreaming.  People around her have told her that she snores.  If she wakes up during the night she switches the TV off.  She does not report nocturia, headaches, dizziness or nausea.  No major discomfort. She sets her alarm for 5.30 AM and on a good night may have had 6 hours of sleep, but often less.  On non-work days her bedtime is not set- she may go to bed at 10- but often doesn't  sleep.  Sleep medical history and family sleep history:  Youngest of 6 siblings, there were 3 sons and 3 daughters.  Older brother has OSA on CPAP. One older sister has insomnia. No history of ENT surgery, TBI or chronic pain conditions, depression, anxiety.     Social history:  Shift work in ED, single, no children. No pets.  caffeine- none, neither coffee, tea, soda. ETOH seldomly- 1 glass wine once a month. No tobacco use, no- vaping.       Review of Systems: I can't sleep  Out of a complete 14 system review, the patient complains of only the following symptoms, and all other reviewed systems are negative.  Snoring  Sleep hygiene- has tried audio books, relaxation tapes, likes the same TV  show over and over. Dim lights, hot shower, eye mask on- ritual. Failed medication Benadryl OTC, melatonin, valerian.    Used to take Ambien 10 mg, xanax prn. 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 Marital status: Single    Spouse name: Not on file  . Number of children: Not on file  . Years of education: Not on file  . Highest education level: Not on file  Occupational History  . Not on file  Tobacco Use  . Smoking status: Never Smoker  . Smokeless tobacco: Never Used  Substance and Sexual Activity  . Alcohol use: Yes    Alcohol/week: 0.0 standard drinks    Comment: occasional  . Drug use: No  . Sexual activity: Not on file  Other Topics Concern  . Not on file  Social History Narrative  . Not on file   Social Determinants of Health   Financial Resource Strain:   . Difficulty of Paying Living Expenses:   Food Insecurity:   . Worried About Charity fundraiser in the Last Year:   . Arboriculturist in the Last Year:   Transportation Needs:   . Film/video editor (Medical):   Marland Kitchen Lack of Transportation (Non-Medical):   Physical Activity:   . Days of  Exercise per Week:   . Minutes of Exercise per Session:   Stress:   . Feeling of Stress :   Social Connections:   . Frequency of Communication with Friends and Family:   . Frequency of Social Gatherings with Friends and Family:   . Attends Religious Services:   . Active Member of Clubs or Organizations:   . Attends Archivist Meetings:   Marland Kitchen Marital Status:   Intimate Partner Violence:   .  Fear of Current or Ex-Partner:   . Emotionally Abused:   Marland Kitchen Physically Abused:   . Sexually Abused:     Family History  Problem Relation Age of Onset  . Hyperlipidemia Mother   . Hypertension Mother   . Cancer Father   . Diabetes Father   . Heart disease Father   . Hypertension Brother   . Hypertension Daughter   . Breast cancer Neg Hx     Past Medical History:  Diagnosis Date  . Diabetes mellitus without complication [facility])     Past Surgical History:  Procedure Laterality Date  . APPENDECTOMY    . CHOLECYSTECTOMY    . MYOMECTOMY      Current Outpatient Medications  Medication Sig Dispense Refill  . ACCU-CHEK GUIDE test strip USE TWICE DAILY TO CHECK BLOOD SUGAR BEFORE BREAKFAST AND DINNER 100 strip 5  . atorvastatin (LIPITOR) 10 MG tablet TAKE 1 TABLET(10 MG) BY MOUTH DAILY 30 tablet 2  . Continuous Blood Gluc Receiver (FREESTYLE LIBRE 14 DAY READER) DEVI USE TO CHECK BLOOD SUGAR BEFORE MEALS AND AT BEDTIME 1 Device 0  . Continuous Blood Gluc Sensor (FREESTYLE LIBRE 14 DAY SENSOR) MISC USE TO MEASURE BLOOD GLUCOSE FOUR TIMES DAILY BEFORE MEALS AND AT BEDTIME 2 each 11  . esomeprazole (NEXIUM) 20 MG capsule GENERIC FOR NEXIUM. TAKE 1 CAPSULE BY MOUTH DAILY AT LEAST 1 HOUR BEFORE A MEAL. SWALLOWING WHOLE. DO NOT CRUSH OR CHEW GRANULES 90 capsule 0  . estradiol (VIVELLE-DOT) 0.025 MG/24HR APPLY TO SKIN TWICE WEEKLY AS DIRECTED 8 patch 1  . FARXIGA 10 MG TABS tablet TAKE 1 TABLET BY MOUTH DAILY BEFORE BREAKFAST 90 tablet 0  . fluticasone (FLONASE) 50 MCG/ACT nasal spray as  needed.   0  . Glucose Blood (GLUCOSE METER TEST VI) by In Vitro route 2 (two) times daily.    . metFORMIN (GLUCOPHAGE) 500 MG tablet TAKE 1 TABLET BY MOUTH EVERY DAY WITH BREAKFAST 90 tablet 1  . progesterone (PROMETRIUM) 100 MG capsule TAKE 2 CAPSULES BY MOUTH EVERY NIGHT AT BEDTIME FOR 2 WEEKS, THEN 3 CAPSULES EVERY NIGHT AT BEDTIME IF NEEDED. TAKE ON DAY 1 TO 27 OF EACH MONTH 81 capsule 1  . Suvorexant (BELSOMRA) 10 MG TABS Take 1 tablet by mouth at bedtime as needed. 30 tablet 2   No current facility-administered medications for this visit.    Allergies as of 06/08/2019  . (No Known Allergies)    Vitals:?  BMI ?   Wt Readings from Last 1 Encounters:  04/12/19 178 lb (80.7 kg)      Last Height:   Ht Readings from Last 1 Encounters:  04/12/19 5' 6.6" (1.692 m)    Physical exam:  General: The patient is awake, alert and appears not in acute distress. The patient is well groomed. Head: Normocephalic, atraumatic. Neck is supple. Mallampati 3,  neck circumference: 15". Nasal airflow patent , Retrognathia is seen.  Cardiovascular:  Regular rate and rhythm, without  murmurs or carotid bruit, and without distended neck veins. Respiratory: Lungs are clear to auscultation. Skin:  Without evidence of edema, or rash Trunk: BMI is 29. The patient's posture is erect. Neurologic exam : The patient is awake and alert, oriented to place and time.   Memory subjective described as intact.   Attention span & concentration ability appears normal.  Speech is fluent,  without dysarthria, dysphonia or aphasia.  Mood and affect are appropriate.  Cranial nerves: Pupils are equal and briskly reactive to light. Extraocular  movements  in vertical and horizontal planes intact and without nystagmus. Visual fields by finger perimetry are intact.Hearing to finger rub intact.  Facial sensation intact to fine touch.  Facial motor strength is symmetric and tongue and uvula move midline. Shoulder shrug was  symmetrical.   Motor exam:  Normal tone, muscle bulk and symmetric strength in all extremities. Sensory:  Fine touch, pinprick and vibration were tested in all extremities. Proprioception tested in the upper extremities was normal. Coordination:  Finger-to-nose maneuver was normal without evidence of ataxia, dysmetria or tremor. Gait and station: Patient walks without assistive device and is able unassisted to climb up to the exam table. Strength within normal limits.  Stance is stable and normal. Tandem gait is unfragmented turns with 3 Steps.  Deep tendon reflexes: in the  upper and lower extremities are symmetric and intact.   Assessment:  After physical and neurologic examination, review of laboratory studies,  Personal review of imaging studies, reports of other /same  Imaging studies, results of polysomnography and / or neurophysiology testing and pre-existing records as far as provided in visit., my assessment is   1)  Mrs. Vanriper has developed insomnia in a slow progressive pattern. This may have been present as long as 9 years now.  She is not sleepy in daytime, but fatigued - reports neither headaches , nor aches or soreness. She reports getting a few hours of sleep only but functions fairly well with this low sleep time.   2) Psychogenic causes are not identified. Her sleep habits are pretty good. She slept pretty well in a hotel during a trip in August. She was then told that she snores. Let's make sure she doesn't have sleep apnea. There is after all a family history of OSA.   3) HST was negative for sleep apnea- AHI was less than 1/h on apnea link.    I spent more than 20 minutes of face to face time with the patient.  She has used a fit bit watch - and she doubts its results, she feels strongly that she hasn't slept when the smart device tells her she did. We have discussed the diagnosis and  She agreed to op obtain an attended sleep study-   Plan:  Treatment plan and additional  workup : attended sleep study here at [facility].    Attended sleep study.  Sleep perception disorder versus true insomnia, primary or psychological ?  I will order the attended PSG .   Larey Seat, MD [id number], 0000000 PM  Certified in Neurology by ABPN Certified in [place] by [facility] [address], [place], [place] [address]

## 2019-06-08 NOTE — Patient Instructions (Signed)
Suvorexant oral tablets What is this medicine? SUVOREXANT (su-vor-EX-ant) is used to treat insomnia. This medicine helps you to fall asleep and sleep through the night. This medicine may be used for other purposes; ask your health care provider or pharmacist if you have questions. COMMON BRAND NAME(S): Belsomra What should I tell my health care provider before I take this medicine? They need to know if you have any of these conditions:  depression  drink alcohol  drug abuse or addiction  feel sleepy or have fallen asleep suddenly during the day  history of a sudden onset of muscle weakness (cataplexy)  liver disease  lung or breathing disease, like asthma or emphysema  sleep apnea  suicidal thoughts, plans, or attempt; a previous suicide attempt by you or a family member  an unusual or allergic reaction to suvorexant, other medicines, foods, dyes, or preservatives  pregnant or trying to get pregnant  breast-feeding How should I use this medicine? Take this medicine by mouth within 30 minutes of going to bed. Do not take it unless you are able to stay in bed a full night before you must be active again. Follow the directions on the prescription label. You may take this medicine with or without a food. However, this medicine may take longer to work if you take it with or right after meals. Do not take your medicine more often than directed. Do not stop taking this medicine on your own. Always follow your doctor or health care professional's advice. A special MedGuide will be given to you by the pharmacist with each prescription and refill. Be sure to read this information carefully each time. Talk to your pediatrician regarding the use of this medicine in children. Special care may be needed. Overdosage: If you think you have taken too much of this medicine contact a poison control center or emergency room at once. NOTE: This medicine is only for you. Do not share this medicine with  others. What if I miss a dose? This medicine should only be taken immediately before going to sleep. Do not take double or extra doses. What may interact with this medicine?  alcohol  antihistamines for allergy, cough, or cold  aprepitant  boceprevir  certain antibiotics like ciprofloxacin, clarithromycin, erythromycin, telithromycin  certain antivirals for HIV or AIDS  certain medicines for anxiety or sleep  certain medicines for depression like amitriptyline, fluoxetine, nefazodone, sertraline  certain medicines for fungal infections like ketoconazole, posaconazole, fluconazole, itraconazole  certain medicines for seizures like carbamazepine, phenobarbital, primidone, phenytoin  conivaptan  digoxin  diltiazem  general anesthetics like halothane, isoflurane, methoxyflurane, propofol  grapefruit juice  imatinib  medicines that relax muscles for surgery  narcotic medicines for pain  phenothiazines like chlorpromazine, mesoridazine, prochlorperazine, thioridazine  rifampin  verapamil This list may not describe all possible interactions. Give your health care provider a list of all the medicines, herbs, non-prescription drugs, or dietary supplements you use. Also tell them if you smoke, drink alcohol, or use illegal drugs. Some items may interact with your medicine. What should I watch for while using this medicine? Visit your health care professional for regular checks on your progress. Tell your health care professional if your symptoms do not start to get better or if they get worse. Avoid caffeine-containing drinks in the evening hours. After taking this medicine, you may get up out of bed and do an activity that you do not know you are doing. The next morning, you may have no memory of this.  Activities include driving a car ("sleep-driving"), making and eating food, talking on the phone, sexual activity, and sleep-walking. Serious injuries have occurred. Call your  doctor right away if you find out you have done any of these activities. Do not take this medicine if you have used alcohol that evening. Do not take it if you have taken another medicine for sleep. °Do not take this medicine unless you are able to stay in bed for a full night (7 to 8 hours) and do not drive or perform other activities requiring full alertness within 8 hours of a dose. Do not drive, use machinery, or do anything that needs mental alertness the day after you take the 20 mg dose of this medicine. The use of lower doses (10 mg) may also cause driving impairment the next day. You may have a decrease in mental alertness the day after use, even if you feel that you are fully awake. Tell your doctor if you will need to perform activities requiring full alertness, such as driving, the next day. Do not stand or sit up quickly after taking this medicine, especially if you are an older patient. This reduces the risk of dizzy or fainting spells. °If you or your family notice any changes in your behavior, such as new or worsening depression, thoughts of harming yourself, anxiety, other unusual or disturbing thoughts, or memory loss, call your health care professional right away. °After you stop taking this medicine, you may have trouble falling asleep. This is called rebound insomnia. This problem usually goes away on its own after 1 or 2 nights. °What side effects may I notice from receiving this medicine? °Side effects that you should report to your doctor or health care professional as soon as possible: °· allergic reactions like skin rash, itching or hives, swelling of the face, lips, or tongue °· hallucinations °· periods of leg weakness lasting from seconds to a few minutes °· suicidal thoughts, mood changes °· unable to move or speak for several minutes while going to sleep or waking up °· unusual activities while not fully awake like driving, eating, making phone calls, or sexual activity °Side effects  that usually do not require medical attention (report these to your doctor or health care professional if they continue or are bothersome): °· daytime drowsiness °· headache °· nightmares or abnormal dreams °· tiredness °This list may not describe all possible side effects. Call your doctor for medical advice about side effects. You may report side effects to FDA at 1-800-FDA-1088. °Where should I keep my medicine? °Keep out of the reach of children. This medicine can be abused. Keep your medicine in a safe place to protect it from theft. Do not share this medicine with anyone. Selling or giving away this medicine is dangerous and against the law. °Store at room temperature between 15 and 30 degrees C (59 and 86 degrees F). Throw away any unused medicine after the expiration date. °NOTE: This sheet is a summary. It may not cover all possible information. If you have questions about this medicine, talk to your doctor, pharmacist, or health care provider. °© 2020 Elsevier/Gold Standard (2018-01-30 16:37:12) ° °

## 2019-06-09 ENCOUNTER — Institutional Professional Consult (permissible substitution): Payer: BC Managed Care – PPO | Admitting: Plastic Surgery

## 2019-06-15 ENCOUNTER — Telehealth: Payer: Self-pay

## 2019-06-15 NOTE — Telephone Encounter (Signed)
LVM for pt to call me back to schedule sleep study  

## 2019-06-22 ENCOUNTER — Telehealth: Payer: Self-pay

## 2019-06-22 NOTE — Telephone Encounter (Signed)
LVM for pt to call me back to schedule sleep study  We have attempted to call the patient two times to schedule sleep study.  Patient has been unavailable at the phone numbers we have on file and has not returned our calls.  If patient calls back we will schedule them for their sleep study.  

## 2019-06-29 ENCOUNTER — Other Ambulatory Visit: Payer: Self-pay | Admitting: Nurse Practitioner

## 2019-06-29 DIAGNOSIS — Z1231 Encounter for screening mammogram for malignant neoplasm of breast: Secondary | ICD-10-CM

## 2019-07-03 ENCOUNTER — Encounter: Payer: Self-pay | Admitting: Nurse Practitioner

## 2019-07-03 ENCOUNTER — Telehealth: Payer: Self-pay

## 2019-07-03 NOTE — Telephone Encounter (Signed)
Looking to see if pt had xanax filled recently

## 2019-07-05 ENCOUNTER — Other Ambulatory Visit: Payer: Self-pay | Admitting: Nurse Practitioner

## 2019-07-05 DIAGNOSIS — F419 Anxiety disorder, unspecified: Secondary | ICD-10-CM

## 2019-07-05 MED ORDER — ALPRAZOLAM 0.25 MG PO TABS: 0.2500 mg | ORAL_TABLET | Freq: Three times a day (TID) | ORAL | 0 refills | Status: DC | PRN

## 2019-07-06 ENCOUNTER — Encounter: Payer: Self-pay | Admitting: Nurse Practitioner

## 2019-07-23 ENCOUNTER — Other Ambulatory Visit: Payer: Self-pay

## 2019-07-23 ENCOUNTER — Ambulatory Visit: Payer: BC Managed Care – PPO

## 2019-07-23 ENCOUNTER — Ambulatory Visit
Admission: RE | Admit: 2019-07-23 | Discharge: 2019-07-23 | Disposition: A | Discharge status: Home / Self Care | Payer: BC Managed Care – PPO | Source: Ambulatory Visit | Attending: Nurse Practitioner | Admitting: Nurse Practitioner

## 2019-07-23 DIAGNOSIS — Z1231 Encounter for screening mammogram for malignant neoplasm of breast: Secondary | ICD-10-CM | POA: Diagnosis not present

## 2019-07-23 DIAGNOSIS — E119 Type 2 diabetes mellitus without complications: Secondary | ICD-10-CM | POA: Diagnosis not present

## 2019-07-23 LAB — HM DIABETES EYE EXAM

## 2019-07-23 IMAGING — MG DIGITAL SCREENING BILAT W/ TOMO W/ CAD
6 of 12 series · 6 of 36 positions shown · non-contrast
Comparison: Previous exam(s).

CLINICAL DATA: Screening.

EXAM:
DIGITAL SCREENING BILATERAL MAMMOGRAM WITH TOMO AND CAD

[R CC synth-2D]
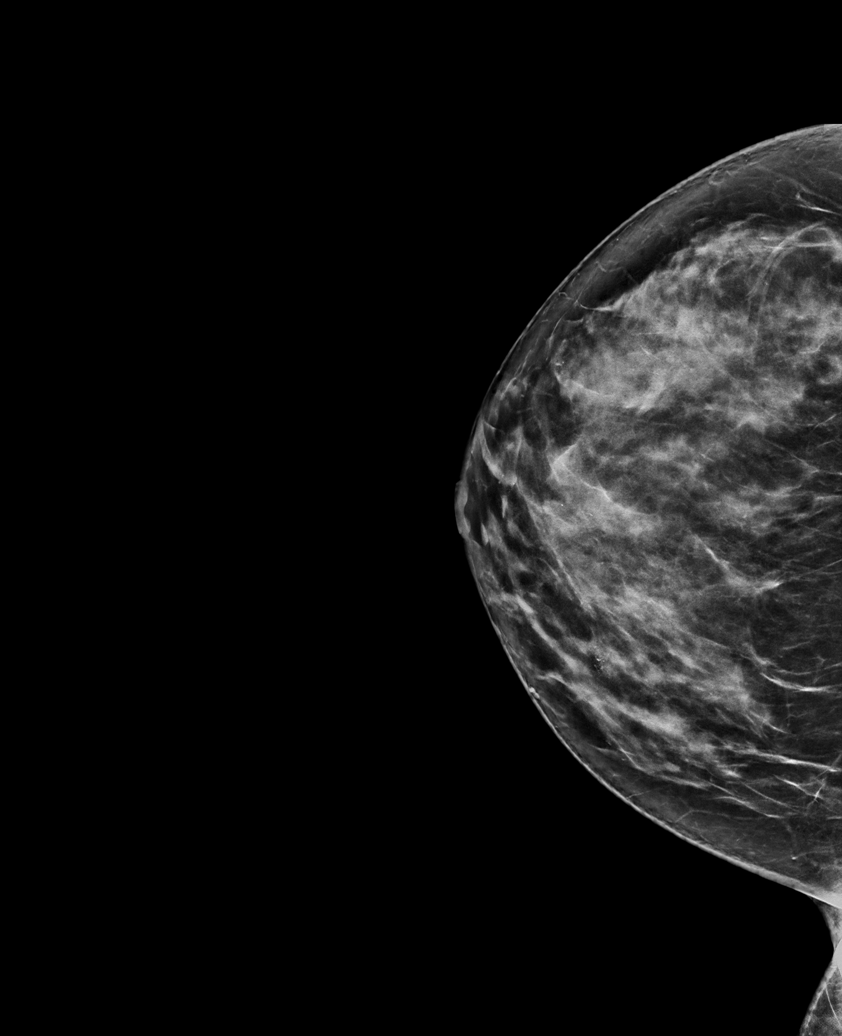

[R MLO synth-2D (1 of 2)]
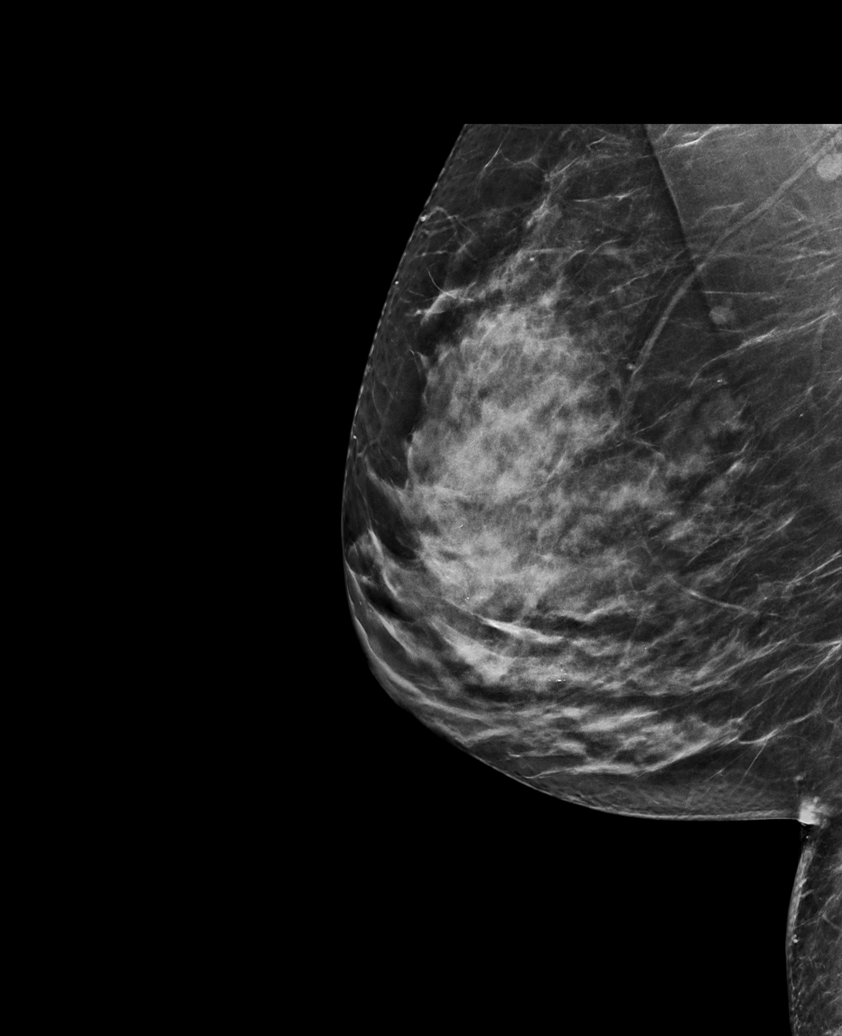

[L MLO synth-2D (1 of 2)]
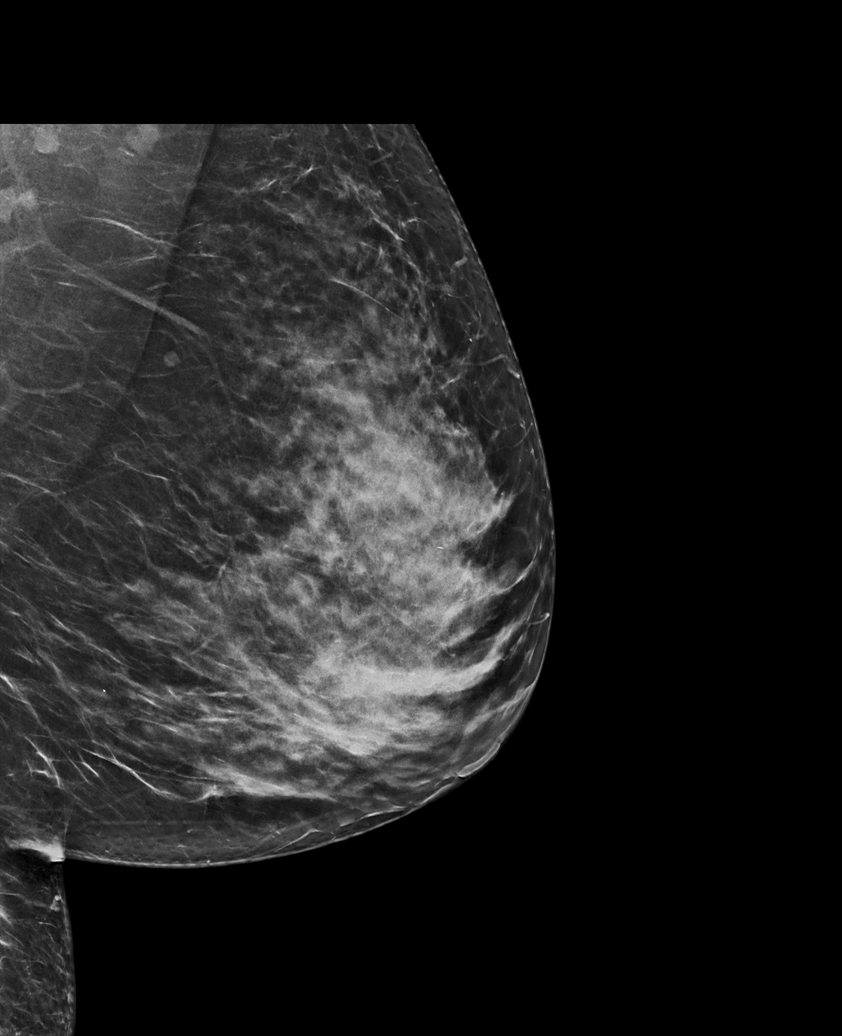

[L CC synth-2D]
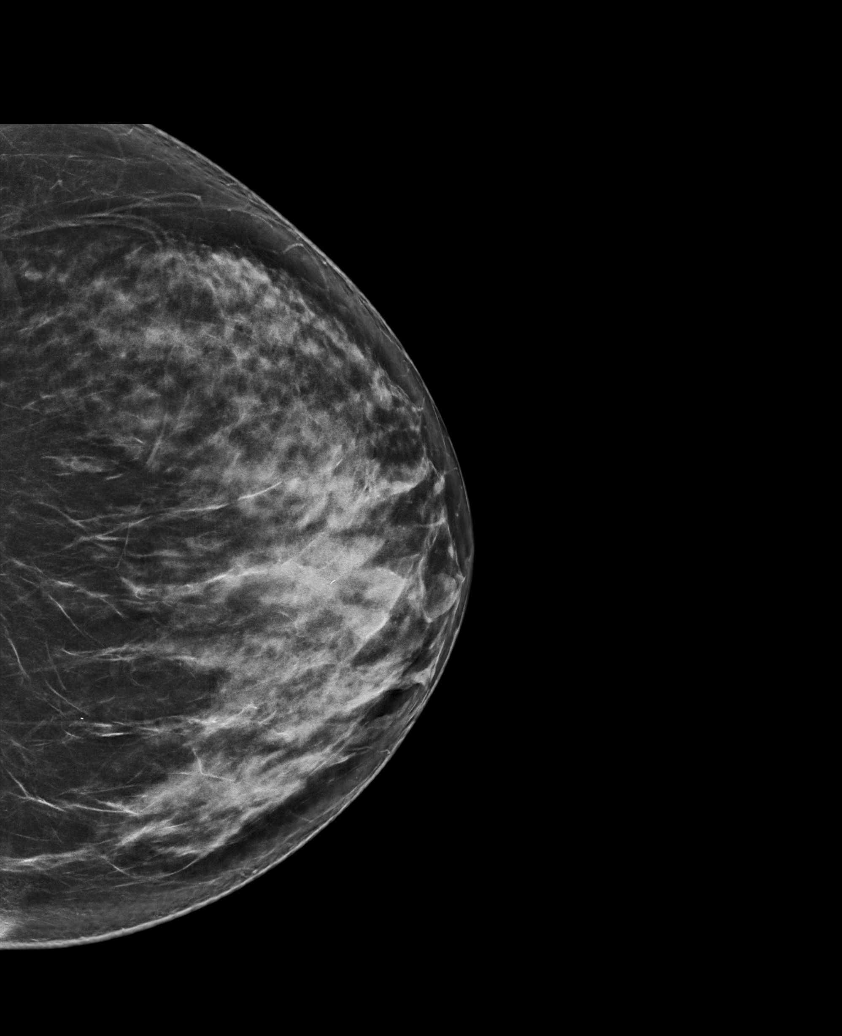

[R MLO synth-2D (2 of 2)]
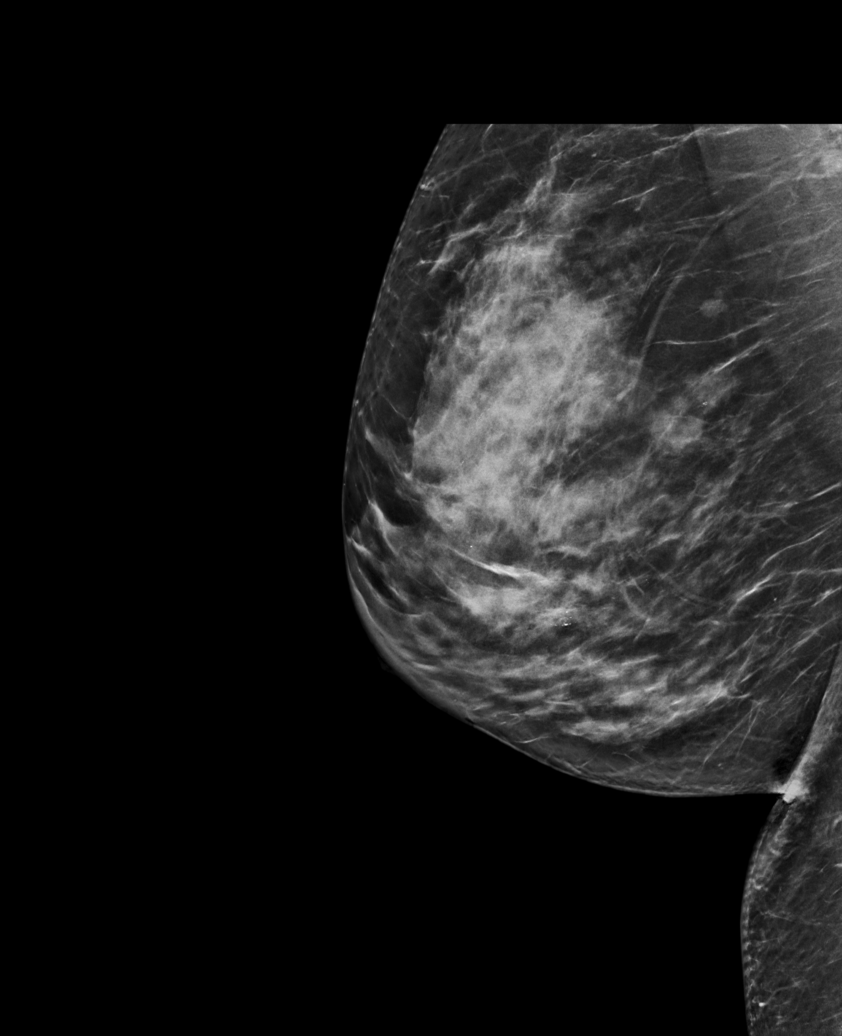

[L MLO synth-2D (2 of 2)]
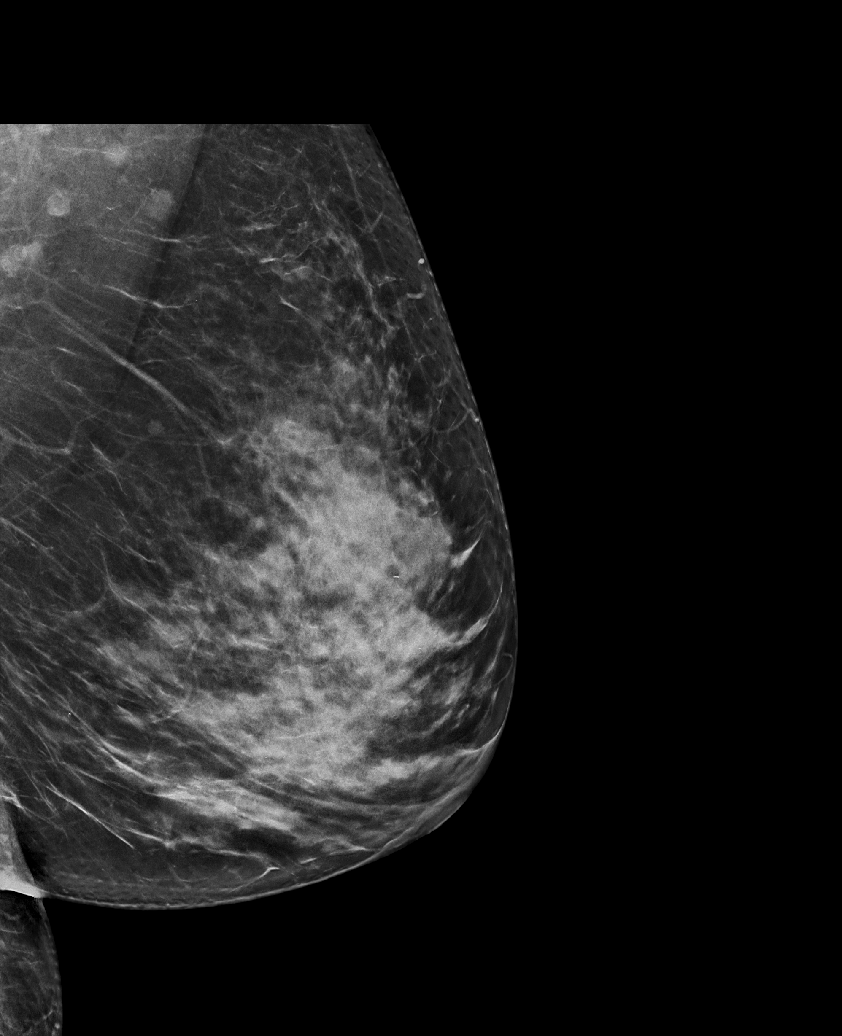

[6 of 36 positions shown; findings below may reference images not displayed]

ACR Breast Density Category d: The breast tissue is extremely dense,
which lowers the sensitivity of mammography.
FINDINGS: In the right breast, calcifications warrant further evaluation with
magnified views. In the left breast, no findings suspicious for
malignancy. Images were processed with CAD.
IMPRESSION: Further evaluation is suggested for calcifications in the right
breast.

RECOMMENDATION:
Diagnostic mammogram of the right breast. (Code:[0D])

The patient will be contacted regarding the findings, and additional
imaging will be scheduled.

BI-RADS CATEGORY  0: Incomplete. Need additional imaging evaluation
and/or prior mammograms for comparison.

## 2019-07-25 ENCOUNTER — Ambulatory Visit (INDEPENDENT_AMBULATORY_CARE_PROVIDER_SITE_OTHER): Payer: BC Managed Care – PPO | Admitting: Neurology

## 2019-07-25 DIAGNOSIS — G4731 Primary central sleep apnea: Secondary | ICD-10-CM

## 2019-07-25 DIAGNOSIS — F5104 Psychophysiologic insomnia: Secondary | ICD-10-CM

## 2019-07-25 DIAGNOSIS — G4726 Circadian rhythm sleep disorder, shift work type: Secondary | ICD-10-CM

## 2019-07-25 DIAGNOSIS — G4700 Insomnia, unspecified: Secondary | ICD-10-CM

## 2019-07-26 ENCOUNTER — Encounter: Payer: Self-pay | Admitting: Nurse Practitioner

## 2019-07-26 ENCOUNTER — Ambulatory Visit (INDEPENDENT_AMBULATORY_CARE_PROVIDER_SITE_OTHER): Payer: BC Managed Care – PPO | Admitting: Nurse Practitioner

## 2019-07-26 ENCOUNTER — Other Ambulatory Visit: Payer: Self-pay

## 2019-07-26 VITALS — BP 118/66 | HR 77 | Temp 98.1°F | Ht 66.4 in | Wt 176.6 lb

## 2019-07-26 DIAGNOSIS — F419 Anxiety disorder, unspecified: Secondary | ICD-10-CM | POA: Diagnosis not present

## 2019-07-26 DIAGNOSIS — Z0000 Encounter for general adult medical examination without abnormal findings: Secondary | ICD-10-CM

## 2019-07-26 DIAGNOSIS — Z114 Encounter for screening for human immunodeficiency virus [HIV]: Secondary | ICD-10-CM

## 2019-07-26 DIAGNOSIS — E119 Type 2 diabetes mellitus without complications: Secondary | ICD-10-CM | POA: Diagnosis not present

## 2019-07-26 DIAGNOSIS — Z1159 Encounter for screening for other viral diseases: Secondary | ICD-10-CM

## 2019-07-26 DIAGNOSIS — S81802A Unspecified open wound, left lower leg, initial encounter: Secondary | ICD-10-CM | POA: Diagnosis not present

## 2019-07-26 DIAGNOSIS — G4700 Insomnia, unspecified: Secondary | ICD-10-CM

## 2019-07-26 MED ORDER — RYBELSUS 7 MG PO TABS: 1.0000 | ORAL_TABLET | Freq: Every day | ORAL | 3 refills | Status: DC

## 2019-07-26 NOTE — Progress Notes (Signed)
Subjective:     Patient ID: Vanessa Allen , female    DOB: 20-Jun-1965 , 54 y.o.   MRN: [id number]   Chief Complaint  Patient presents with  . Annual Exam   The patient states she uses post menopausal status for birth control.  Negative for Dysmenorrhea and Negative for Menorrhagia Mammogram last done 07/23/2019. Negative for: breast discharge, breast lump(s), breast pain and breast self exam.  Pertinent negatives include abnormal bleeding (hematology), anxiety, decreased libido, depression, difficulty falling sleep, dyspareunia, history of infertility, nocturia, sexual dysfunction, sleep disturbances, urinary incontinence, urinary urgency, vaginal discharge and vaginal itching. Diet regular, avoids fried food and rarely eats out. The patient states her exercise level is minimal, walks a lot with work.  She is working 5 days a week currently.    The patient's tobacco use is:  Social History   Tobacco Use  Smoking Status Never Smoker  Smokeless Tobacco Never Used   She has been exposed to passive smoke. The patient's alcohol use is:  Social History   Substance and Sexual Activity  Alcohol Use Yes  . Alcohol/week: 0.0 standard drinks   Comment: occasional   Additional information: Last pap done 06/2018, next one scheduled for 2023.    Here for HM. She did her sleep study last night.  She also had her vision checked last week Nori Riis - ophthalmology).   Wt Readings from Last 3 Encounters: 07/26/19 : 176 lb 9.6 oz (80.1 kg) 04/12/19 : 178 lb (80.7 kg) 02/16/19 : 185 lb 3.2 oz (84 kg)   Diabetes She presents for her follow-up diabetic visit. She has type 2 diabetes mellitus. Her disease course has been stable. There are no hypoglycemic associated symptoms. There are no diabetic associated symptoms. There are no hypoglycemic complications. There are no diabetic complications. Risk factors for coronary artery disease include diabetes mellitus, obesity and sedentary lifestyle. Current  diabetic treatment includes oral agent (monotherapy). She is compliant with treatment all of the time. She is following a diabetic diet. When asked about meal planning, she reported none. She rarely participates in exercise. She does not see a podiatrist.Eye exam is not current.  Insomnia Primary symptoms: no fragmented sleep, no sleep disturbance, no difficulty falling asleep.  The current episode started more than one month. The onset quality is gradual. The problem occurs nightly. The problem has been rapidly worsening since onset. The treatment provided no relief. PMH includes: associated symptoms present, no hypertension. Prior diagnostic workup includes:  No prior workup.        Past Medical History:  Diagnosis Date  . Diabetes mellitus without complication ([place])      Family History  Problem Relation Age of Onset  . Hyperlipidemia Mother   . Hypertension Mother   . Cancer Father   . Diabetes Father   . Heart disease Father   . Hypertension Brother   . Hypertension Daughter   . Breast cancer Neg Hx      Current Outpatient Medications:  .  ACCU-CHEK GUIDE test strip, USE TWICE DAILY TO CHECK BLOOD SUGAR BEFORE BREAKFAST AND DINNER, Disp: 100 strip, Rfl: 5 .  ALPRAZolam (XANAX) 0.25 MG tablet, Take 1 tablet (0.25 mg total) by mouth 3 (three) times daily as needed for anxiety., Disp: 30 tablet, Rfl: 0 .  atorvastatin (LIPITOR) 10 MG tablet, TAKE 1 TABLET(10 MG) BY MOUTH DAILY, Disp: 30 tablet, Rfl: 2 .  Continuous Blood Gluc Receiver (FREESTYLE LIBRE 14 DAY READER) DEVI, USE TO CHECK BLOOD SUGAR BEFORE  MEALS AND AT BEDTIME, Disp: 1 Device, Rfl: 0 .  Continuous Blood Gluc Sensor (FREESTYLE LIBRE 14 DAY SENSOR) MISC, USE TO MEASURE BLOOD GLUCOSE FOUR TIMES DAILY BEFORE MEALS AND AT BEDTIME, Disp: 2 each, Rfl: 11 .  esomeprazole (NEXIUM) 20 MG capsule, GENERIC FOR NEXIUM. TAKE 1 CAPSULE BY MOUTH DAILY AT LEAST 1 HOUR BEFORE A MEAL. SWALLOWING WHOLE. DO NOT CRUSH OR CHEW GRANULES,  Disp: 90 capsule, Rfl: 0 .  estradiol (VIVELLE-DOT) 0.025 MG/24HR, APPLY TO SKIN TWICE WEEKLY AS DIRECTED, Disp: 8 patch, Rfl: 1 .  FARXIGA 10 MG TABS tablet, TAKE 1 TABLET BY MOUTH DAILY BEFORE BREAKFAST, Disp: 90 tablet, Rfl: 0 .  fluticasone (FLONASE) 50 MCG/ACT nasal spray, as needed. , Disp: , Rfl: 0 .  Glucose Blood (GLUCOSE METER TEST VI), by In Vitro route 2 (two) times daily., Disp: , Rfl:  .  metFORMIN (GLUCOPHAGE) 500 MG tablet, TAKE 1 TABLET BY MOUTH EVERY DAY WITH BREAKFAST, Disp: 90 tablet, Rfl: 1 .  progesterone (PROMETRIUM) 100 MG capsule, TAKE 2 CAPSULES BY MOUTH EVERY NIGHT AT BEDTIME FOR 2 WEEKS, THEN 3 CAPSULES EVERY NIGHT AT BEDTIME IF NEEDED. TAKE ON DAY 1 TO 27 OF EACH MONTH, Disp: 81 capsule, Rfl: 1 .  Suvorexant (BELSOMRA) 10 MG TABS, Take 1 tablet by mouth at bedtime as needed., Disp: 30 tablet, Rfl: 0   No Known Allergies   Review of Systems  Constitutional: Negative.   HENT: Negative.   Eyes: Negative.   Respiratory: Negative.   Cardiovascular: Negative.  Negative for chest pain (intermittent sharp pain), palpitations and leg swelling.  Gastrointestinal: Negative.   Endocrine: Negative.   Genitourinary: Negative.   Musculoskeletal: Negative.   Skin: Negative.   Allergic/Immunologic: Negative.   Neurological: Negative.   Hematological: Negative.   Psychiatric/Behavioral: The patient has insomnia.      Today's Vitals   07/26/19 0846  BP: 118/66  Pulse: 77  Temp: 98.1 F (36.7 C)  TempSrc: Oral  Weight: 176 lb 9.6 oz (80.1 kg)  Height: 5' 6.4" (1.687 m)   Body mass index is 28.16 kg/m.   Objective:  Physical Exam Vitals reviewed.  Constitutional:      General: She is not in acute distress.    Appearance: Normal appearance.  HENT:     Head: Normocephalic and atraumatic.     Right Ear: Tympanic membrane, ear canal and external ear normal. There is no impacted cerumen.     Left Ear: Tympanic membrane, ear canal and external ear normal. There is  no impacted cerumen.     Nose:     Comments: Deferred - masked    Mouth/Throat:     Comments: Deferred - masked Eyes:     Extraocular Movements: Extraocular movements intact.     Conjunctiva/sclera: Conjunctivae normal.     Pupils: Pupils are equal, round, and reactive to light.  Cardiovascular:     Rate and Rhythm: Normal rate and regular rhythm.     Pulses: Normal pulses.     Heart sounds: Normal heart sounds. No murmur heard.   Pulmonary:     Effort: Pulmonary effort is normal. No respiratory distress.     Breath sounds: Normal breath sounds.  Abdominal:     General: Abdomen is flat. Bowel sounds are normal. There is no distension.     Palpations: Abdomen is soft.  Musculoskeletal:        General: No swelling or tenderness. Normal range of motion.     Cervical back: Normal  range of motion and neck supple. No rigidity.  Skin:    General: Skin is warm and dry.     Capillary Refill: Capillary refill takes less than 2 seconds.     Coloration: Skin is not jaundiced.  Neurological:     General: No focal deficit present.     Mental Status: She is alert and oriented to person, place, and time.     Cranial Nerves: No cranial nerve deficit.  Psychiatric:        Mood and Affect: Mood normal.        Behavior: Behavior normal.        Thought Content: Thought content normal.        Judgment: Judgment normal.      Assessment And Plan:   1. Encounter for general adult medical examination w/o abnormal findings . Behavior modifications discussed and diet history reviewed.   . Pt will continue to exercise regularly and modify diet with low GI, plant based foods and decrease intake of processed foods.  . Recommend intake of daily multivitamin, Vitamin D, and calcium.  . Recommend mammogram and colonoscopy for preventive screenings, as well as recommend immunizations that include influenza, TDAP - CBC with Diff  2. Encounter for hepatitis C screening test for low risk patient  Will  check for Hepatitis C screening due to being born between the years [birth year]-1965 - Hepatitis C antibody  3. Type 2 diabetes mellitus without complication, without long-term current use of insulin (HCC)  Chronic, stable  Insurance coverage for [facility] is $300 for 30 day supply.   Will send rybelsus and give her a discount card.   Diabetic foot exam done with decreased sensation to right dorsal foot on lateral side.  - POCT Urinalysis Dipstick (81002) - POCT UA - Microalbumin - EKG 12-Lead - CBC with Diff - Hemoglobin A1c - Lipid panel - CMP14+EGFR - Semaglutide (RYBELSUS) 7 MG TABS; Take 1 tablet by mouth daily.  Dispense: 30 tablet; Refill: 3  4. Encounter for screening for HIV  - HIV antibody (with reflex)  5. Anxiety  Chronic, stable  Continue with current medications  6. Insomnia, unspecified type  Chronic, she is currently tolerating belsomra  7. Wound of left lower extremity, initial encounter  Left lower leg with open wound, reddened center with yellow slough present to edges, I am concerned this is a vascular ulcer and will refer her to vascular for further evaluation.           [name], FNP    THE PATIENT IS ENCOURAGED TO PRACTICE SOCIAL DISTANCING DUE TO THE COVID-19 PANDEMIC.

## 2019-07-26 NOTE — Patient Instructions (Signed)
Health Maintenance, Female Adopting a healthy lifestyle and getting preventive care are important in promoting health and wellness. Ask your health care provider about:  The right schedule for you to have regular tests and exams.  Things you can do on your own to prevent diseases and keep yourself healthy. What should I know about diet, weight, and exercise? Eat a healthy diet   Eat a diet that includes plenty of vegetables, fruits, low-fat dairy products, and lean protein.  Do not eat a lot of foods that are high in solid fats, added sugars, or sodium. Maintain a healthy weight Body mass index (BMI) is used to identify weight problems. It estimates body fat based on height and weight. Your health care provider can help determine your BMI and help you achieve or maintain a healthy weight. Get regular exercise Get regular exercise. This is one of the most important things you can do for your health. Most adults should:  Exercise for at least 150 minutes each week. The exercise should increase your heart rate and make you sweat (moderate-intensity exercise).  Do strengthening exercises at least twice a week. This is in addition to the moderate-intensity exercise.  Spend less time sitting. Even light physical activity can be beneficial. Watch cholesterol and blood lipids Have your blood tested for lipids and cholesterol at 52 years of age, then have this test every 5 years. Have your cholesterol levels checked more often if:  Your lipid or cholesterol levels are high.  You are older than 52 years of age.  You are at high risk for heart disease. What should I know about cancer screening? Depending on your health history and family history, you may need to have cancer screening at various ages. This may include screening for:  Breast cancer.  Cervical cancer.  Colorectal cancer.  Skin cancer.  Lung cancer. What should I know about heart disease, diabetes, and high blood  pressure? Blood pressure and heart disease  High blood pressure causes heart disease and increases the risk of stroke. This is more likely to develop in people who have high blood pressure readings, are of African descent, or are overweight.  Have your blood pressure checked: ? Every 3-5 years if you are 18-39 years of age. ? Every year if you are 40 years old or older. Diabetes Have regular diabetes screenings. This checks your fasting blood sugar level. Have the screening done:  Once every three years after age 40 if you are at a normal weight and have a low risk for diabetes.  More often and at a younger age if you are overweight or have a high risk for diabetes. What should I know about preventing infection? Hepatitis B If you have a higher risk for hepatitis B, you should be screened for this virus. Talk with your health care provider to find out if you are at risk for hepatitis B infection. Hepatitis C Testing is recommended for:  Everyone born from 1945 through 1965.  Anyone with known risk factors for hepatitis C. Sexually transmitted infections (STIs)  Get screened for STIs, including gonorrhea and chlamydia, if: ? You are sexually active and are younger than 52 years of age. ? You are older than 52 years of age and your health care provider tells you that you are at risk for this type of infection. ? Your sexual activity has changed since you were last screened, and you are at increased risk for chlamydia or gonorrhea. Ask your health care provider if   you are at risk.  Ask your health care provider about whether you are at high risk for HIV. Your health care provider may recommend a prescription medicine to help prevent HIV infection. If you choose to take medicine to prevent HIV, you should first get tested for HIV. You should then be tested every 3 months for as long as you are taking the medicine. Pregnancy  If you are about to stop having your period (premenopausal) and  you may become pregnant, seek counseling before you get pregnant.  Take 400 to 800 micrograms (mcg) of folic acid every day if you become pregnant.  Ask for birth control (contraception) if you want to prevent pregnancy. Osteoporosis and menopause Osteoporosis is a disease in which the bones lose minerals and strength with aging. This can result in bone fractures. If you are 65 years old or older, or if you are at risk for osteoporosis and fractures, ask your health care provider if you should:  Be screened for bone loss.  Take a calcium or vitamin D supplement to lower your risk of fractures.  Be given hormone replacement therapy (HRT) to treat symptoms of menopause. Follow these instructions at home: Lifestyle  Do not use any products that contain nicotine or tobacco, such as cigarettes, e-cigarettes, and chewing tobacco. If you need help quitting, ask your health care provider.  Do not use street drugs.  Do not share needles.  Ask your health care provider for help if you need support or information about quitting drugs. Alcohol use  Do not drink alcohol if: ? Your health care provider tells you not to drink. ? You are pregnant, may be pregnant, or are planning to become pregnant.  If you drink alcohol: ? Limit how much you use to 0-1 drink a day. ? Limit intake if you are breastfeeding.  Be aware of how much alcohol is in your drink. In the [place] one drink equals one 12 oz bottle of beer (355 mL), one 5 oz glass of wine (148 mL), or one 1 oz glass of hard liquor (44 mL). General instructions  Schedule regular health, dental, and eye exams.  Stay current with your vaccines.  Tell your health care provider if: ? You often feel depressed. ? You have ever been abused or do not feel safe at home. Summary  Adopting a healthy lifestyle and getting preventive care are important in promoting health and wellness.  Follow your health care provider's instructions about healthy  diet, exercising, and getting tested or screened for diseases.  Follow your health care provider's instructions on monitoring your cholesterol and blood pressure. This information is not intended to replace advice given to you by your health care provider. Make sure you discuss any questions you have with your health care provider. Document Revised: 01/07/2018 Document Reviewed: 01/07/2018 Elsevier Patient Education  2020 [employer]  

## 2019-07-27 LAB — CMP14+EGFR
ALT: 32 IU/L (ref 0–32)
AST: 21 IU/L (ref 0–40)
Albumin/Globulin Ratio: 1.7 (ref 1.2–2.2)
Albumin: 4.5 g/dL (ref 3.8–4.9)
Alkaline Phosphatase: 84 IU/L (ref 48–121)
BUN/Creatinine Ratio: 14 (ref 9–23)
BUN: 12 mg/dL (ref 6–24)
Bilirubin Total: 0.5 mg/dL (ref 0.0–1.2)
CO2: 21 mmol/L (ref 20–29)
Calcium: 9.9 mg/dL (ref 8.7–10.2)
Chloride: 105 mmol/L (ref 96–106)
Creatinine, Ser: 0.85 mg/dL (ref 0.57–1.00)
GFR calc Af Amer: 92 mL/min/{1.73_m2} (ref >59)
GFR calc non Af Amer: 80 mL/min/{1.73_m2} (ref >59)
Globulin, Total: 2.7 g/dL (ref 1.5–4.5)
Glucose: 125 mg/dL — ABNORMAL HIGH (ref 65–99)
Potassium: 4.2 mmol/L (ref 3.5–5.2)
Sodium: 145 mmol/L — ABNORMAL HIGH (ref 134–144)
Total Protein: 7.2 g/dL (ref 6.0–8.5)

## 2019-07-27 LAB — HEPATITIS C ANTIBODY: Hep C Virus Ab: <0.1 s/co ratio (ref 0.0–0.9)

## 2019-07-27 LAB — CBC WITH DIFFERENTIAL/PLATELET
Basophils Absolute: 0 10*3/uL (ref 0.0–0.2)
Basos: 1 %
EOS (ABSOLUTE): 0.1 10*3/uL (ref 0.0–0.4)
Eos: 2 %
Hematocrit: 42.9 % (ref 34.0–46.6)
Hemoglobin: 14.4 g/dL (ref 11.1–15.9)
Immature Grans (Abs): 0 10*3/uL (ref 0.0–0.1)
Immature Granulocytes: 0 %
Lymphocytes Absolute: 2.6 10*3/uL (ref 0.7–3.1)
Lymphs: 51 %
MCH: 27.3 pg (ref 26.6–33.0)
MCHC: 33.6 g/dL (ref 31.5–35.7)
MCV: 81 fL (ref 79–97)
Monocytes Absolute: 0.3 10*3/uL (ref 0.1–0.9)
Monocytes: 5 %
Neutrophils Absolute: 2.1 10*3/uL (ref 1.4–7.0)
Neutrophils: 41 %
Platelets: 255 10*3/uL (ref 150–450)
RBC: 5.27 x10E6/uL (ref 3.77–5.28)
RDW: 13.3 % (ref 11.7–15.4)
WBC: 5.2 10*3/uL (ref 3.4–10.8)

## 2019-07-27 LAB — LIPID PANEL
Chol/HDL Ratio: 4 ratio (ref 0.0–4.4)
Cholesterol, Total: 166 mg/dL (ref 100–199)
HDL: 42 mg/dL (ref >39)
LDL Chol Calc (NIH): 98 mg/dL (ref 0–99)
Triglycerides: 150 mg/dL — ABNORMAL HIGH (ref 0–149)
VLDL Cholesterol Cal: 26 mg/dL (ref 5–40)

## 2019-07-27 LAB — HEMOGLOBIN A1C
Est. average glucose Bld gHb Est-mCnc: 140 mg/dL
Hgb A1c MFr Bld: 6.5 % — ABNORMAL HIGH (ref 4.8–5.6)

## 2019-07-27 LAB — HIV ANTIBODY (ROUTINE TESTING W REFLEX): HIV Screen 4th Generation wRfx: NONREACTIVE

## 2019-07-29 ENCOUNTER — Other Ambulatory Visit: Payer: Self-pay | Admitting: Nurse Practitioner

## 2019-07-29 ENCOUNTER — Encounter: Payer: Self-pay | Admitting: Nurse Practitioner

## 2019-07-29 DIAGNOSIS — R928 Other abnormal and inconclusive findings on diagnostic imaging of breast: Secondary | ICD-10-CM

## 2019-08-09 ENCOUNTER — Other Ambulatory Visit: Payer: Self-pay | Admitting: Nurse Practitioner

## 2019-08-09 DIAGNOSIS — E119 Type 2 diabetes mellitus without complications: Secondary | ICD-10-CM

## 2019-08-19 ENCOUNTER — Encounter: Payer: Self-pay | Admitting: Plastic Surgery

## 2019-08-19 ENCOUNTER — Other Ambulatory Visit: Payer: Self-pay | Admitting: Nurse Practitioner

## 2019-08-19 ENCOUNTER — Ambulatory Visit: Payer: BC Managed Care – PPO

## 2019-08-19 ENCOUNTER — Ambulatory Visit (INDEPENDENT_AMBULATORY_CARE_PROVIDER_SITE_OTHER): Payer: BC Managed Care – PPO | Admitting: Plastic Surgery

## 2019-08-19 ENCOUNTER — Other Ambulatory Visit: Payer: Self-pay

## 2019-08-19 ENCOUNTER — Encounter: Payer: Self-pay | Admitting: Neurology

## 2019-08-19 ENCOUNTER — Ambulatory Visit
Admission: RE | Admit: 2019-08-19 | Discharge: 2019-08-19 | Disposition: A | Discharge status: Home / Self Care | Payer: BC Managed Care – PPO | Source: Ambulatory Visit | Attending: Nurse Practitioner | Admitting: Nurse Practitioner

## 2019-08-19 VITALS — BP 140/90 | HR 87 | Temp 98.1°F | Ht 66.0 in | Wt 178.0 lb

## 2019-08-19 DIAGNOSIS — R921 Mammographic calcification found on diagnostic imaging of breast: Secondary | ICD-10-CM

## 2019-08-19 DIAGNOSIS — R922 Inconclusive mammogram: Secondary | ICD-10-CM | POA: Diagnosis not present

## 2019-08-19 DIAGNOSIS — M67439 Ganglion, unspecified wrist: Secondary | ICD-10-CM

## 2019-08-19 DIAGNOSIS — R928 Other abnormal and inconclusive findings on diagnostic imaging of breast: Secondary | ICD-10-CM

## 2019-08-19 IMAGING — MG DIGITAL DIAGNOSTIC UNILAT RIGHT W/ CAD
3 series · 3 of 3 positions shown · non-contrast
Comparison: Previous exam(s).

CLINICAL DATA: The patient was called back for right breast
calcifications.

EXAM:
DIGITAL DIAGNOSTIC RIGHT MAMMOGRAM

[R CC]
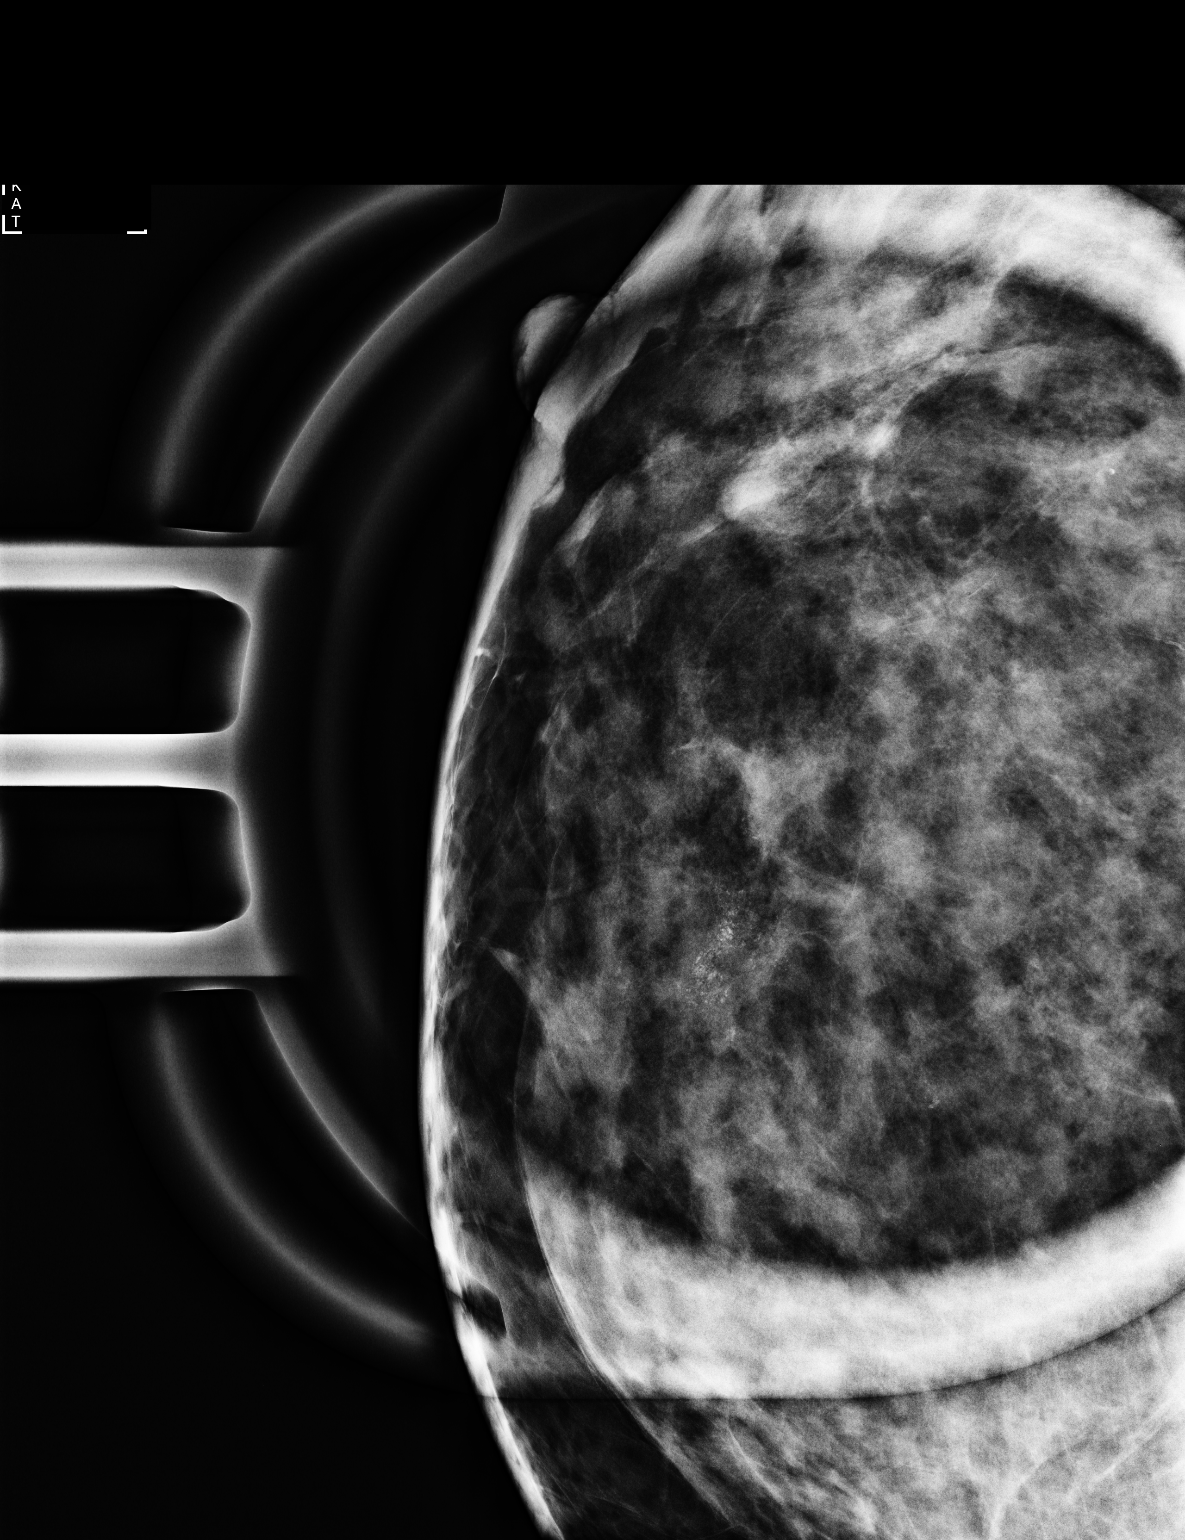

[R ML (1 of 2)]
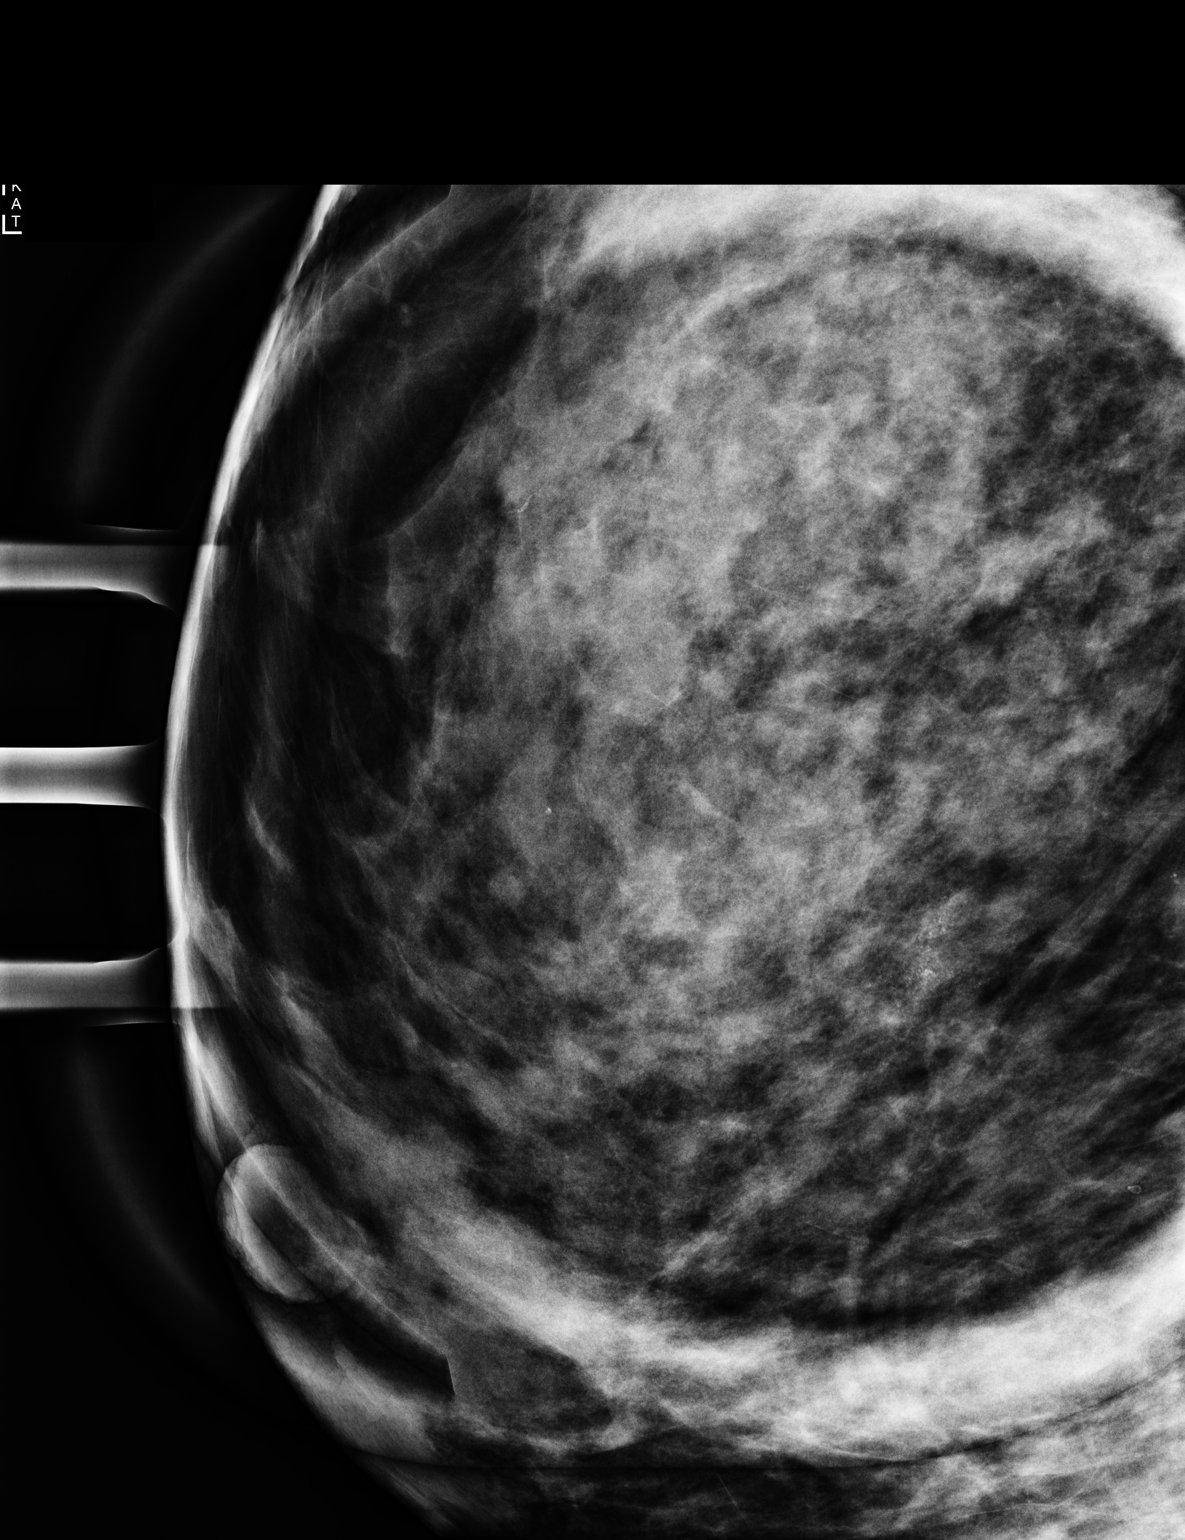

[R ML (2 of 2)]
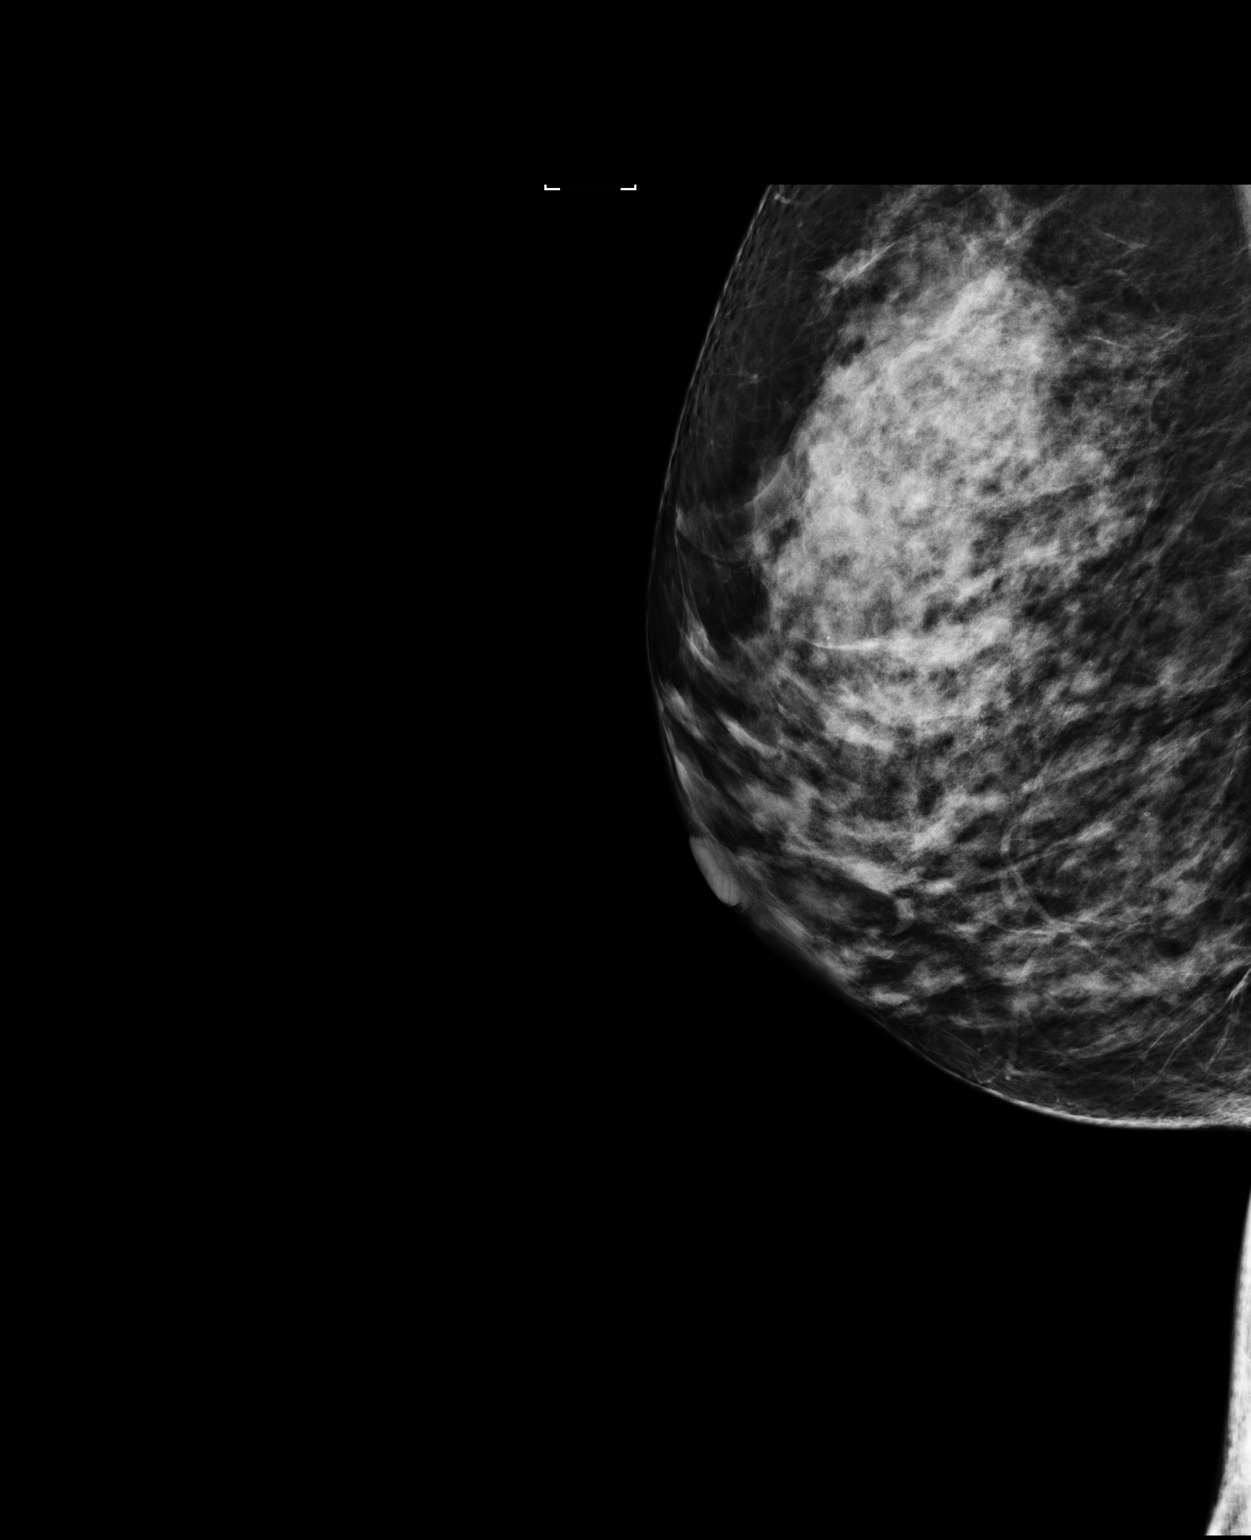

[3 of 3 positions shown; findings below may reference images not displayed]

ACR Breast Density Category c: The breast tissue is heterogeneously
dense, which may obscure small masses.
FINDINGS: The calcifications in the medial right breast persist on additional
imaging spanning up to 1.9 cm.
IMPRESSION: Indeterminate right breast calcifications located medially.

RECOMMENDATION:
Stereotactic biopsy of the right breast calcifications.

I have discussed the findings and recommendations with the patient.
If applicable, a reminder letter will be sent to the patient
regarding the next appointment.

BI-RADS CATEGORY  4: Suspicious.

## 2019-08-19 NOTE — Progress Notes (Signed)
Referring Provider [name], [address],  [place] [id number]   CC:  Chief Complaint  Patient presents with  . Consult      Vanessa Allen is an 54 y.o. female.  HPI: Patient presents with a cystic lesion on the dorsal aspect of her radial left wrist.  Is been present for at least 6 months.  It grew to a larger size previously and then went away but has since recurred over the last couple months.  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  She would like to have it definitively managed.  No Known Allergies  Outpatient Encounter Medications as of 08/19/2019  Medication Sig  . ACCU-CHEK GUIDE test strip USE TWICE DAILY TO CHECK BLOOD SUGAR BEFORE BREAKFAST AND DINNER  . ALPRAZolam (XANAX) 0.25 MG tablet Take 1 tablet (0.25 mg total) by mouth 3 (three) times daily as needed for anxiety.  Marland Kitchen atorvastatin (LIPITOR) 10 MG tablet TAKE 1 TABLET(10 MG) BY MOUTH DAILY  . Continuous Blood Gluc Receiver (FREESTYLE LIBRE 14 DAY READER) DEVI USE TO CHECK BLOOD SUGAR BEFORE MEALS AND AT BEDTIME  . Continuous Blood Gluc Sensor (FREESTYLE LIBRE 14 DAY SENSOR) MISC USE TO MEASURE BLOOD GLUCOSE FOUR TIMES DAILY BEFORE MEALS AND AT BEDTIME  . esomeprazole (NEXIUM) 20 MG capsule GENERIC FOR NEXIUM. TAKE 1 CAPSULE BY MOUTH DAILY AT LEAST 1 HOUR BEFORE A MEAL. SWALLOWING WHOLE. DO NOT CRUSH OR CHEW GRANULES  . estradiol (VIVELLE-DOT) 0.025 MG/24HR APPLY TO SKIN TWICE WEEKLY AS DIRECTED  . fluticasone (FLONASE) 50 MCG/ACT nasal spray as needed.   . Glucose Blood (GLUCOSE METER TEST VI) by In Vitro route 2 (two) times daily.  . metFORMIN (GLUCOPHAGE) 500 MG tablet TAKE 1 TABLET BY MOUTH EVERY DAY WITH BREAKFAST  . progesterone (PROMETRIUM) 100 MG capsule TAKE 2 CAPSULES BY MOUTH EVERY NIGHT AT BEDTIME FOR 2 WEEKS, THEN 3 CAPSULES EVERY NIGHT AT BEDTIME IF NEEDED. TAKE ON DAY 1 TO 27 OF EACH MONTH  . Semaglutide (RYBELSUS) 7 MG TABS  Take 1 tablet by mouth daily.  . Suvorexant (BELSOMRA) 10 MG TABS Take 1 tablet by mouth at bedtime as needed.   No facility-administered encounter medications on file as of 08/19/2019.     Past Medical History:  Diagnosis Date  . Diabetes mellitus without complication [facility])     Past Surgical History:  Procedure Laterality Date  . APPENDECTOMY    . CHOLECYSTECTOMY    . MYOMECTOMY      Family History  Problem Relation Age of Onset  . Hyperlipidemia Mother   . Hypertension Mother   . Cancer Father   . Diabetes Father   . Heart disease Father   . Hypertension Brother   . Hypertension Daughter   . Breast cancer Neg Hx     Social History   Social History Narrative  . Not on file     Review of Systems General: Denies fevers, chills, weight loss CV: Denies chest pain, shortness of breath, palpitations  Physical Exam Vitals with BMI 08/19/2019 07/26/2019 04/12/2019  Height 5\' 6"  5' 6.4" 5' 6.6"  Weight 178 lbs 176 lbs 10 oz 178 lbs  BMI 28.74 80.99 83.3  Systolic 825 053 976  Diastolic 90 66 76  Pulse 87 77 74    General:  No acute distress,  Alert and oriented, Non-Toxic, Normal speech and affect Left hand: Fingers well-perfused with normal  capillary refill palp radial pulse.  Sensation intact throughout.  She has normal range of motion in fingers.  She has a cystic lesion on the dorsal radial aspect of the wrist right at the 3 4 interval.  Is consistent with a ganglion cyst.  Its about 2 to 2.5 cm in size and is very prominent.  Assessment/Plan Patient presents with a suspected dorsal ganglion cyst of the left wrist.  We discussed excision.  We did also discussed percutaneous aspiration but I have told her the recurrence rate with this treatment method is higher.  She wants to pursue excision.  We discussed the risk that include bleeding, infection, damage to surrounding structures including tendons and need for additional procedures.  We discussed the expected  postoperative recovery.  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  We will plan to set this up to be excised in the operating room.  Cindra Presume 08/19/2019, 12:48 PM

## 2019-08-20 DIAGNOSIS — M25561 Pain in right knee: Secondary | ICD-10-CM | POA: Diagnosis not present

## 2019-08-20 DIAGNOSIS — M542 Cervicalgia: Secondary | ICD-10-CM | POA: Diagnosis not present

## 2019-08-20 DIAGNOSIS — M545 Low back pain: Secondary | ICD-10-CM | POA: Diagnosis not present

## 2019-08-24 ENCOUNTER — Telehealth: Payer: Self-pay | Admitting: Neurology

## 2019-08-24 DIAGNOSIS — F5104 Psychophysiologic insomnia: Secondary | ICD-10-CM

## 2019-08-24 DIAGNOSIS — R5382 Chronic fatigue, unspecified: Secondary | ICD-10-CM

## 2019-08-24 DIAGNOSIS — G4700 Insomnia, unspecified: Secondary | ICD-10-CM

## 2019-08-24 DIAGNOSIS — G4726 Circadian rhythm sleep disorder, shift work type: Secondary | ICD-10-CM

## 2019-08-24 MED ORDER — AMITRIPTYLINE HCL 25 MG PO TABS: 25.0000 mg | ORAL_TABLET | Freq: Every day | ORAL | 3 refills | Status: DC

## 2019-08-24 NOTE — Progress Notes (Signed)
Insomnia may be related to limb movements and snoring, mild apnea. Lets treat apnea and see if the patient 's sleep improves.

## 2019-08-24 NOTE — Procedures (Signed)
PATIENT'S NAME:  Vanessa Allen, Vanessa Allen DOB:      1965/12/21      MR#:    [id number]     DATE OF RECORDING: 07/25/2019 [facility] REFERRING M.D.:  [name] FNP Study Performed:   Baseline Polysomnogram HISTORY:  Vanessa Allen is a 54 y.o. female patient, last seen in a revisit on 06-08-2019. The patient has been seen in a Sleep related consultation in September 2019 and a home sleep test had been obtained , on an apnea link at the time, it showed only an oxygen desaturation nadir of 83% ,SPO2 50 minutes of desaturation time for the total night, and average heart rate of 70 bpm.  The device cannot tell me as the heart rate is regular or not.  There was no evidence of sleep apnea with an AHI of 0.7/h.  The patient had significant fatigue and felt that there were other organic causes to her insomnia.  Unfortunately, an attended sleep study had first been denied, based on what she is telling me I would think that she does need to have an attended sleep study.  She has worked night shifts in the past which could contribute to a circadian rhythm disorder.  Her chief complaint remains insomnia and explained without any correlating medical events at the onset.   She has more fatigue after days at work, especially consecutive days. "The more tired I am, the harder it is to go to sleep- I toss and turn for hours" still uses a sleep mask, bedroom is cool and quiet.    Social history:  Shift work in ED, single, no children. No pets.  caffeine- none, neither coffee, tea, soda. ETOH seldomly- 1 glass wine once a month.   The patient endorsed the Epworth Sleepiness Scale at 2/24 points.   The patient's weight 178 pounds with a height of 66 (inches), resulting in a BMI of 28.7 kg/m2. The patient's neck circumference measured 15 inches.  CURRENT MEDICATIONS: Lipitor, Nexium, estradiol, Farxiga, Flonase, Glucophage, Prometrium, Belsomra   PROCEDURE:  This is a multichannel digital polysomnogram utilizing the Somnostar  11.2 system.  Electrodes and sensors were applied and monitored per AASM Specifications.   EEG, EOG, Chin and Limb EMG, were sampled at 200 Hz.  ECG, Snore and Nasal Pressure, Thermal Airflow, Respiratory Effort, CPAP Flow and Pressure, Oximetry was sampled at 50 Hz. Digital video and audio were recorded.      BASELINE STUDY  Lights Out was at 22:05 and Lights On at 05:00.  Total recording time (TRT) was 415 minutes, with a total sleep time (TST) of 210 minutes.   The patient's sleep latency was 151 minutes.  REM latency was 209 minutes.  The sleep efficiency was poor at 50.6 %.     SLEEP ARCHITECTURE: WASO (Wake after sleep onset) was 119.5 minutes.  There were 10.5 minutes in Stage N1, 134.5 minutes Stage N2, 21.5 minutes Stage N3 and 43.5 minutes in Stage REM.  The percentage of Stage N1 was 5.%, Stage N2 was 64.%, Stage N3 was 10.2% and Stage R (REM sleep) was 20.7%.     RESPIRATORY ANALYSIS:  There were a total of 19 respiratory events:  2 obstructive apneas, 4 central apneas and 13 hypopneas.    The total APNEA/HYPOPNEA INDEX (AHI) was 5.4/hour.  12 events occurred in REM sleep and 10 events in NREM. The REM AHI was 16.6 /hour, versus a non-REM AHI of 2.5. The patient spent 46 minutes of total sleep time in the  supine position and 164 minutes in non-supine. The supine AHI was 1.3 versus a non-supine AHI of 6.6.  OXYGEN SATURATION & C02:  The Wake baseline 02 saturation was 96%, with the lowest being 91%. Time spent below 89% saturation equaled 0 minutes.  The arousals were noted as: 22 were spontaneous, 2 were associated with PLMs, 3 were associated with respiratory events. The patient had a total of 5 Periodic Limb Movements.  The Periodic Limb Movement (PLM) Arousal index was 0.6/hour.  Audio and video analysis did not show any abnormal or unusual movements, behaviors, phonations or vocalizations.   Snoring was noted. EKG was in keeping with normal sinus rhythm  (NSR).   IMPRESSION:  1. Mild Central Sleep Apnea (CSA) at AHI of 5.4/h. REM AHI was high at 16.6/h, which speaks more for a non- central form of apnea.  2. Mild Periodic Limb Movement Disorder (PLMD) but many spontaneous arousals followed movements which were non-periodic.  3. Primary Snoring- consistently through non -REM and REM sleep.    RECOMMENDATIONS:  Advise full night, attended, CPAP titration study to optimize therapy in a patient with more central than obstructive events.   Many spontaneous arousals may be related to snoring and non- periodic limb movements.    I certify that I have reviewed the entire raw data recording prior to the issuance of this report in accordance with the Standards of Accreditation of the American Academy of Sleep Medicine (AASM)   Larey Seat, MD Diplomat, American Board of Psychiatry and Neurology  Diplomat, American Board of Sleep Medicine Market researcher, [facility] [employer]

## 2019-08-24 NOTE — Progress Notes (Signed)
IMPRESSION:   1. Mild Central Sleep Apnea (CSA) at AHI of 5.4/h. REM AHI was  high at 16.6/h, which speaks more for a non- central form of  apnea.  2. Mild Periodic Limb Movement Disorder (PLMD) but many  spontaneous arousals followed movements which were non-periodic.  3. Primary Snoring- consistently through non -REM and REM sleep.    RECOMMENDATIONS:   Advise full night, attended, CPAP titration study to optimize  therapy in a patient with more central than obstructive events.   Many spontaneous arousals may be related to snoring and non-  periodic  limb movements.

## 2019-08-24 NOTE — Telephone Encounter (Signed)
I called pt. I advised pt that Dr. Brett Fairy reviewed their sleep study results and found that has mild central slep apnea and recommends that pt be treated with a cpap. Dr. Brett Fairy recommends that pt return for a repeat sleep study in order to properly titrate the cpap and ensure a good mask fit. Pt is agreeable to returning for a titration study. I advised pt that our sleep lab will file with pt's insurance and call pt to schedule the sleep study when we hear back from the pt's insurance regarding coverage of this sleep study. Pt verbalized understanding of results. Pt had no questions at this time but was encouraged to call back if questions arise.  Pt asked," what about the the amount of time it took for me to fall asleep?"  Pt has tried and failed, Trazodone, Belsomra,melatonin 10 mg, Dayvigo, Ambien and still has difficulty with sleeping. The belsomra was the recent medication tried and was not helpful with sleep. Advised the patient that I will bring that to Dr Dohmeier's attention and see what she recommends.

## 2019-08-24 NOTE — Telephone Encounter (Signed)
-----   Message from Larey Seat, MD sent at 08/24/2019  2:05 PM EDT ----- IMPRESSION:   1. Mild Central Sleep Apnea (CSA) at AHI of 5.4/h. REM AHI was  high at 16.6/h, which speaks more for a non- central form of  apnea.  2. Mild Periodic Limb Movement Disorder (PLMD) but many  spontaneous arousals followed movements which were non-periodic.  3. Primary Snoring- consistently through non -REM and REM sleep.    RECOMMENDATIONS:   Advise full night, attended, CPAP titration study to optimize  therapy in a patient with more central than obstructive events.   Many spontaneous arousals may be related to snoring and non-  periodic  limb movements.

## 2019-08-24 NOTE — Telephone Encounter (Signed)
Prolonged sleep latency is not a result of sleep apnea- it often can be anxiety/ depression related and sleep hygiene related. Is she willing to undergo cognitive behavior therapy if the insomnia is chronic?  Before returning to the sleep lab, this patient may try amitriptyline, especially if she has muscle aches, tension neck or headaches. This medication reduces the frequency of nocturia , too. I recommend 25 mg po at bedtime.

## 2019-08-24 NOTE — Addendum Note (Signed)
Addended by: Larey Seat on: 08/24/2019 11:57 AM   Modules accepted: Orders

## 2019-08-25 ENCOUNTER — Other Ambulatory Visit: Payer: Self-pay | Admitting: Neurology

## 2019-08-25 ENCOUNTER — Ambulatory Visit
Admission: RE | Admit: 2019-08-25 | Discharge: 2019-08-25 | Disposition: A | Discharge status: Home / Self Care | Payer: BC Managed Care – PPO | Source: Ambulatory Visit | Attending: Nurse Practitioner | Admitting: Nurse Practitioner

## 2019-08-25 DIAGNOSIS — R921 Mammographic calcification found on diagnostic imaging of breast: Secondary | ICD-10-CM

## 2019-08-25 DIAGNOSIS — D0511 Intraductal carcinoma in situ of right breast: Secondary | ICD-10-CM | POA: Diagnosis not present

## 2019-08-25 HISTORY — PX: BREAST BIOPSY: SHX20

## 2019-08-25 IMAGING — MG MM BREAST BX W/ LOC DEV 1ST LESION IMAGE BX SPEC STEREO GUIDE*R*
8 series · 8 of 24 positions shown · non-contrast
Comparison: Previous exams.
COMPARISON: Previous exams.

Addendum:
CLINICAL DATA: And presents for stereotactic guided core biopsy of
calcifications in the RIGHT breast.

EXAM:
RIGHT BREAST STEREOTACTIC CORE NEEDLE BIOPSY

[R (1 of 2)]
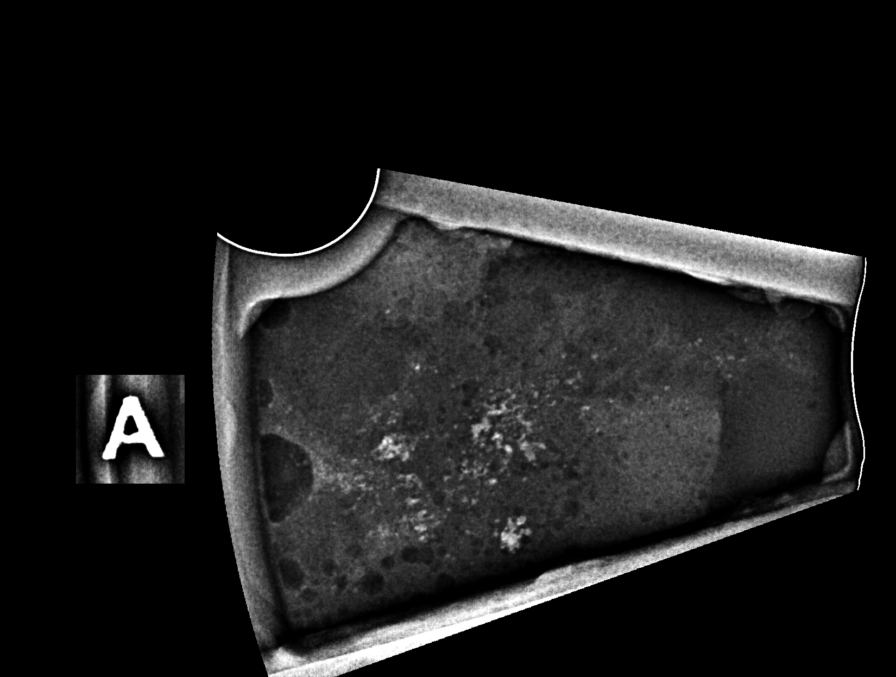

[R (2 of 2)]
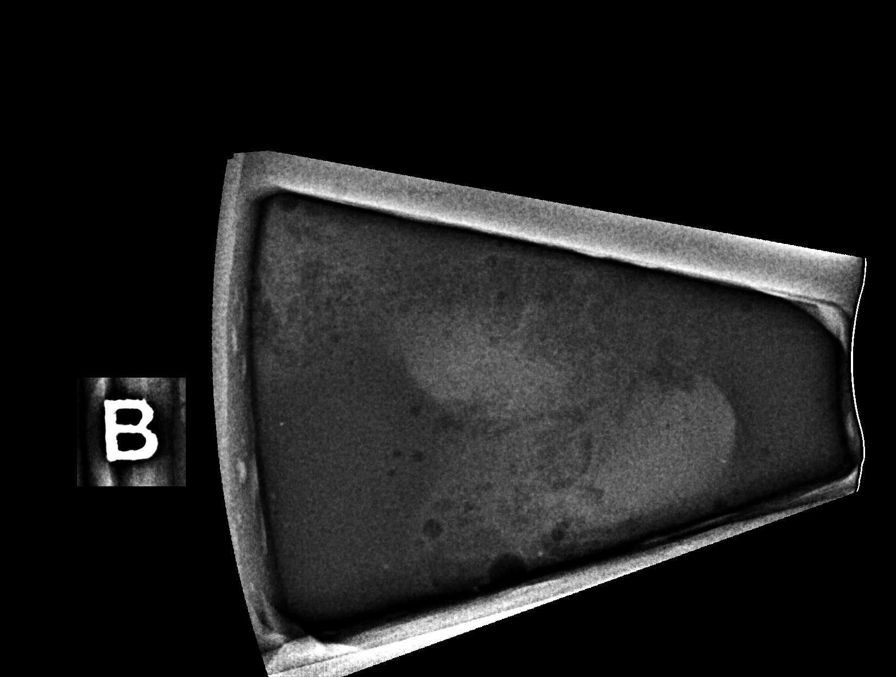

[R ML (1 of 2)]
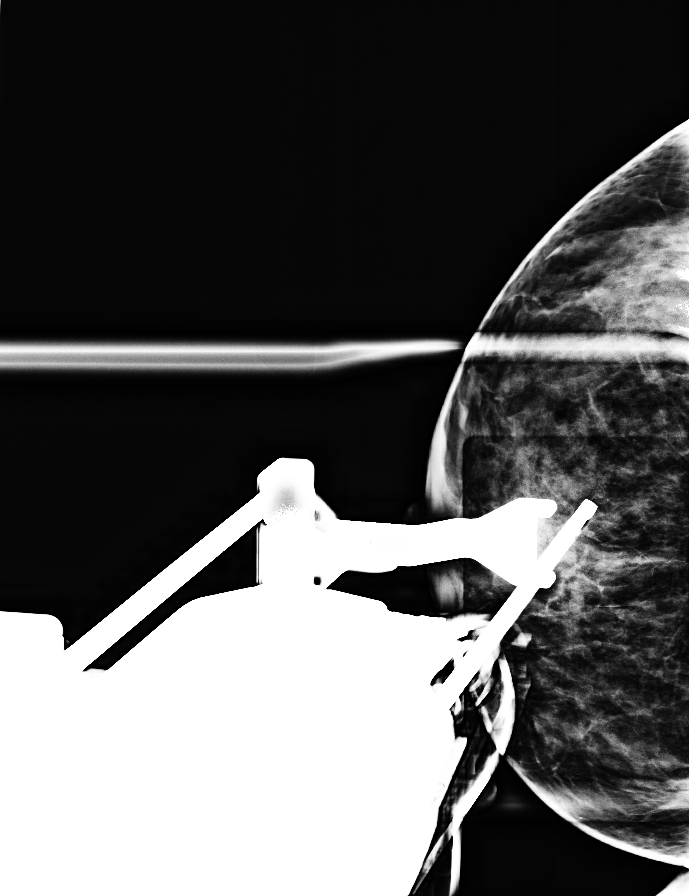

[R ML (2 of 2)]
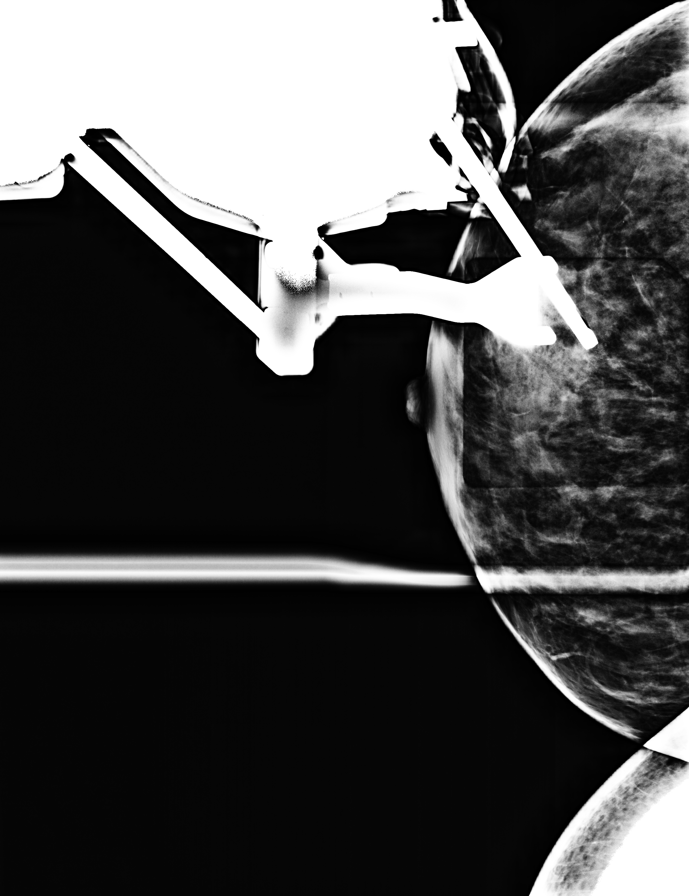

[R ML tomo (1 of 4) · tomo slice 31/62.0]
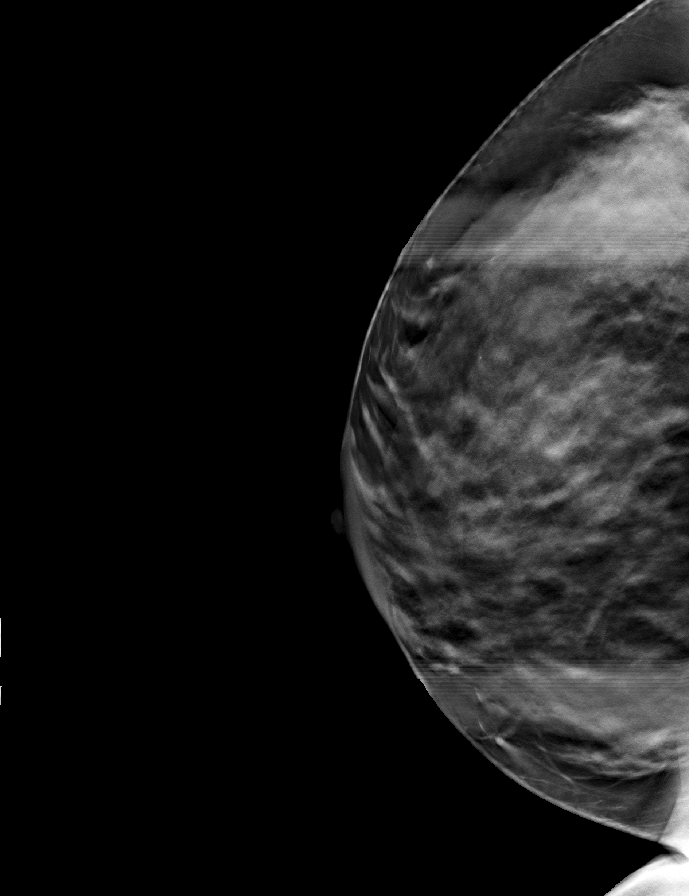

[R ML tomo (2 of 4) · tomo slice 29/56.0]
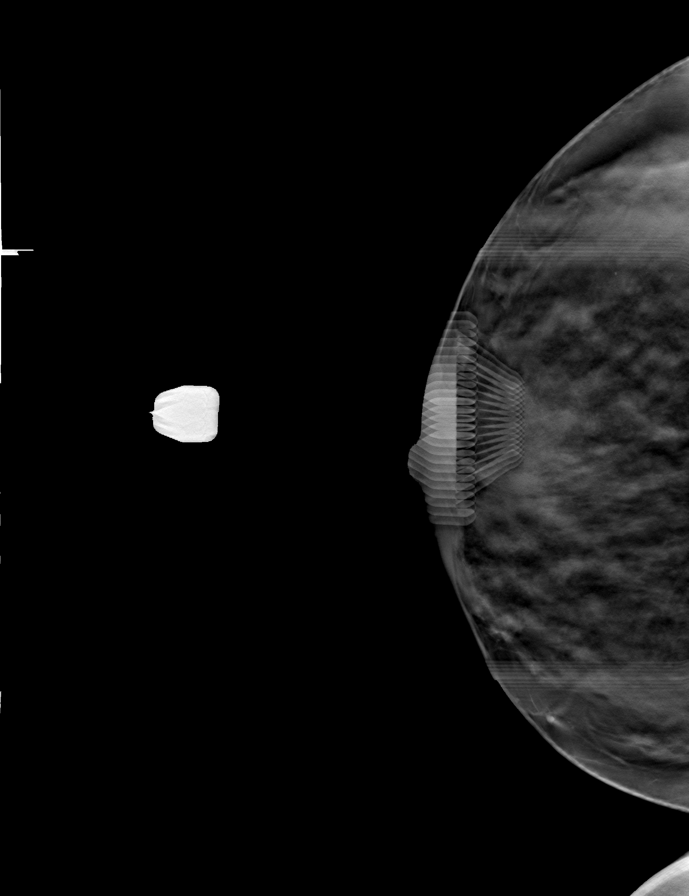

[R ML tomo (3 of 4) · tomo slice 29/56.0]
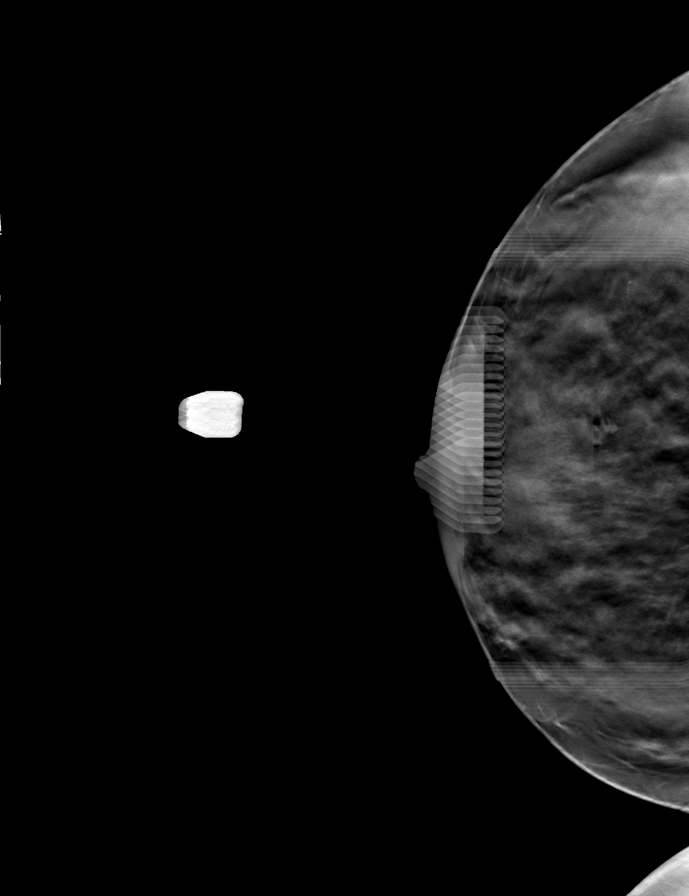

[R ML tomo (4 of 4) · tomo slice 29/57.0]
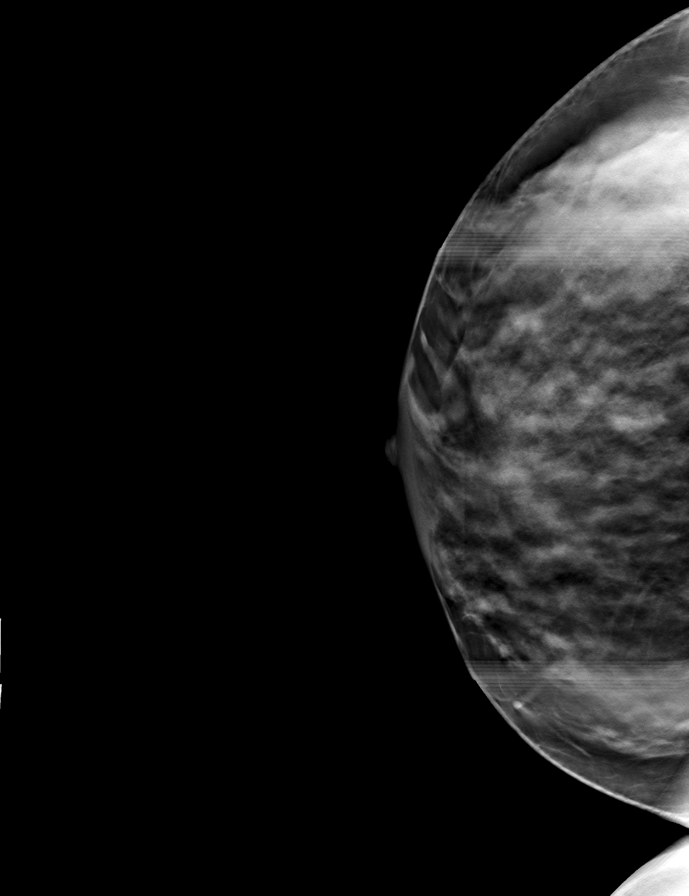

[8 of 24 positions shown; findings below may reference images not displayed]



Using sterile technique and 1% lidocaine and 1% lidocaine with
epinephrine as local anesthetic, under stereotactic guidance, a 9
gauge vacuum assisted device was used to perform core needle biopsy
of calcifications in the UPPER INNER QUADRANT of the RIGHT breast
using a MEDIAL to LATERAL approach. Specimen radiograph was
performed showing calcifications in numerous tissue samples.
Specimens with calcifications are identified for pathology.

Lesion quadrant: Upper inner quadrant

At the conclusion of the procedure, coil shaped tissue marker clip
was deployed into the biopsy cavity. Follow-up 2-view mammogram was
performed and dictated separately.
IMPRESSION: Stereotactic-guided biopsy of RIGHT breast calcifications. No
apparent complications.

ADDENDUM:
Pathology revealed INTERMEDIATE GRADE DUCTAL CARCINOMA INVOLVING A
PAPILLARY LESION of the RIGHT breast, upper inner quadrant, (coil
clip). The lesion is fragmented hampering assessment and invasion
can not be excluded. Lymph node sampling at the time of excision is
recommended. This was found to be concordant by Dr. DONIIS.

Pathology results were discussed with the patient by telephone. The
patient reported doing well after the biopsy with tenderness and
bleeding at the site. Post biopsy instructions and care were
reviewed and questions were answered. The patient was encouraged to
call The [REDACTED] for any additional
concerns. My direct phone number was provided.

The patient was referred to [REDACTED]
[REDACTED] at [REDACTED] on
[DATE].

The patient is scheduled for a RIGHT axillary ultrasound on [DATE] for further evaluation of the lymph nodes due to the
potential invasive component.

Pathology results reported by DONIIS, RN on [DATE].



Using sterile technique and 1% lidocaine and 1% lidocaine with
epinephrine as local anesthetic, under stereotactic guidance, a 9
gauge vacuum assisted device was used to perform core needle biopsy
of calcifications in the UPPER INNER QUADRANT of the RIGHT breast
using a MEDIAL to LATERAL approach. Specimen radiograph was
performed showing calcifications in numerous tissue samples.
Specimens with calcifications are identified for pathology.

Lesion quadrant: Upper inner quadrant

At the conclusion of the procedure, coil shaped tissue marker clip
was deployed into the biopsy cavity. Follow-up 2-view mammogram was
performed and dictated separately.
IMPRESSION: Stereotactic-guided biopsy of RIGHT breast calcifications. No
apparent complications.

## 2019-08-25 IMAGING — MG MM BREAST LOCALIZATION CLIP
4 series · 4 of 12 positions shown · non-contrast
Comparison: Previous exam(s).

CLINICAL DATA: Status post stereotactic guided core biopsy of
calcifications in the RIGHT breast.

EXAM:
DIAGNOSTIC RIGHT MAMMOGRAM POST STEREOTACTIC BIOPSY

[R CC synth-2D]
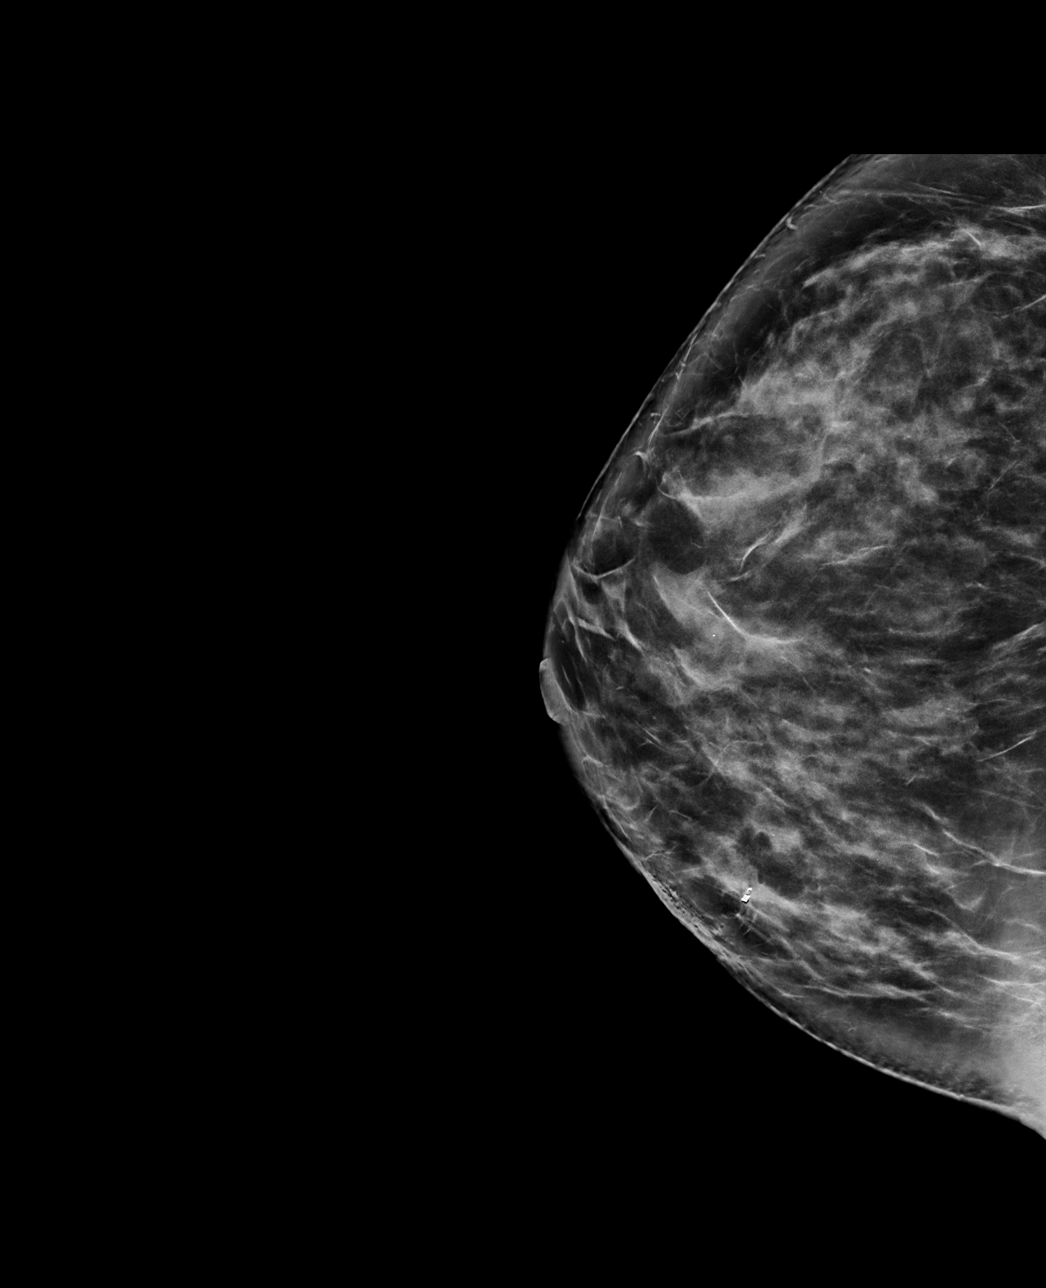

[R ML synth-2D]
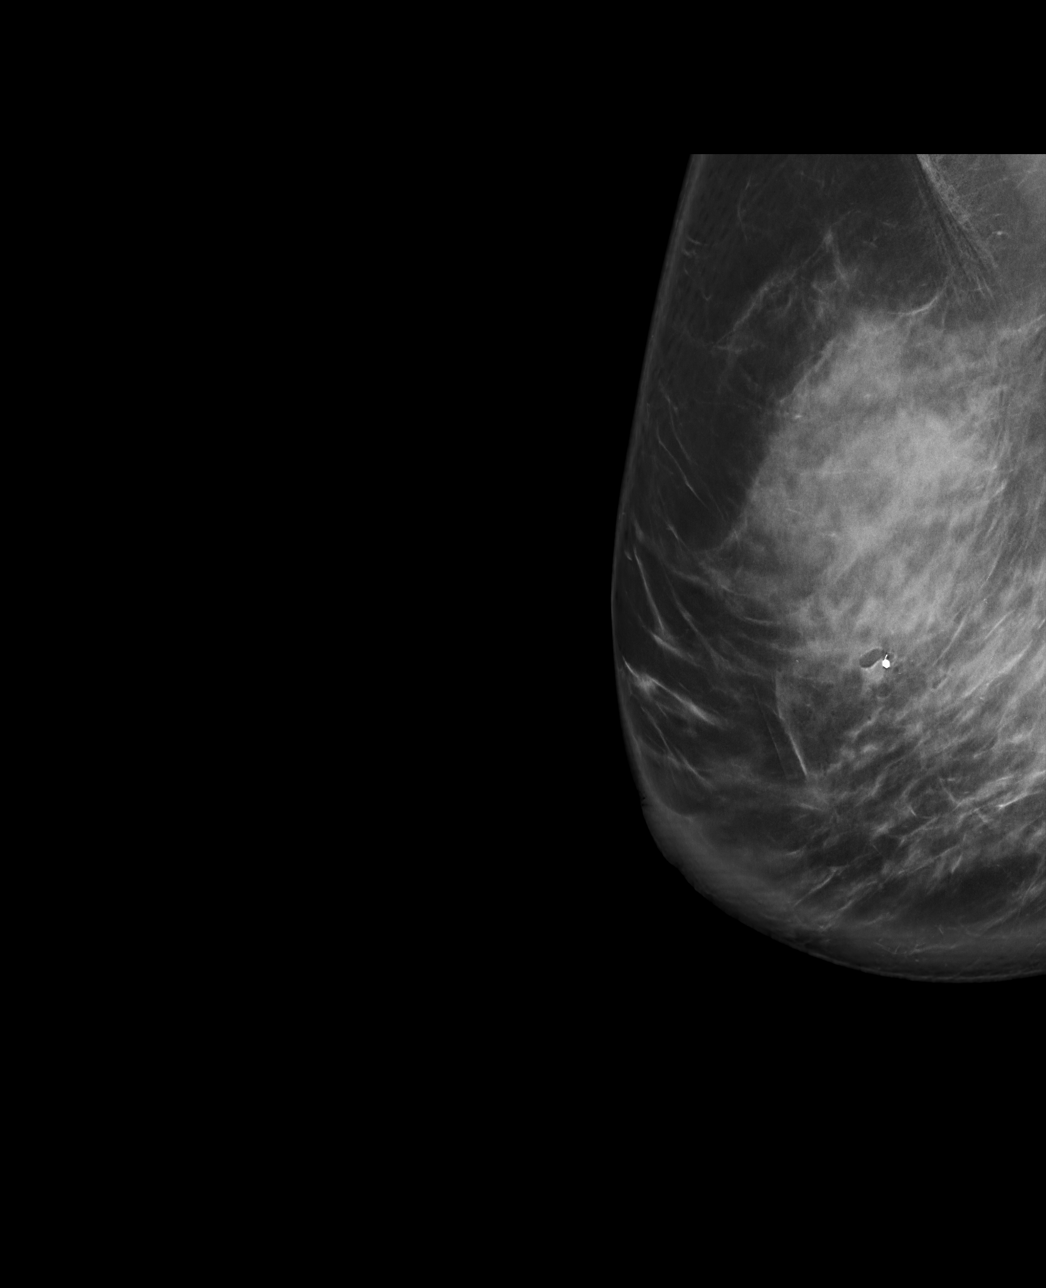

[R ML tomo · tomo slice 61/122.0]
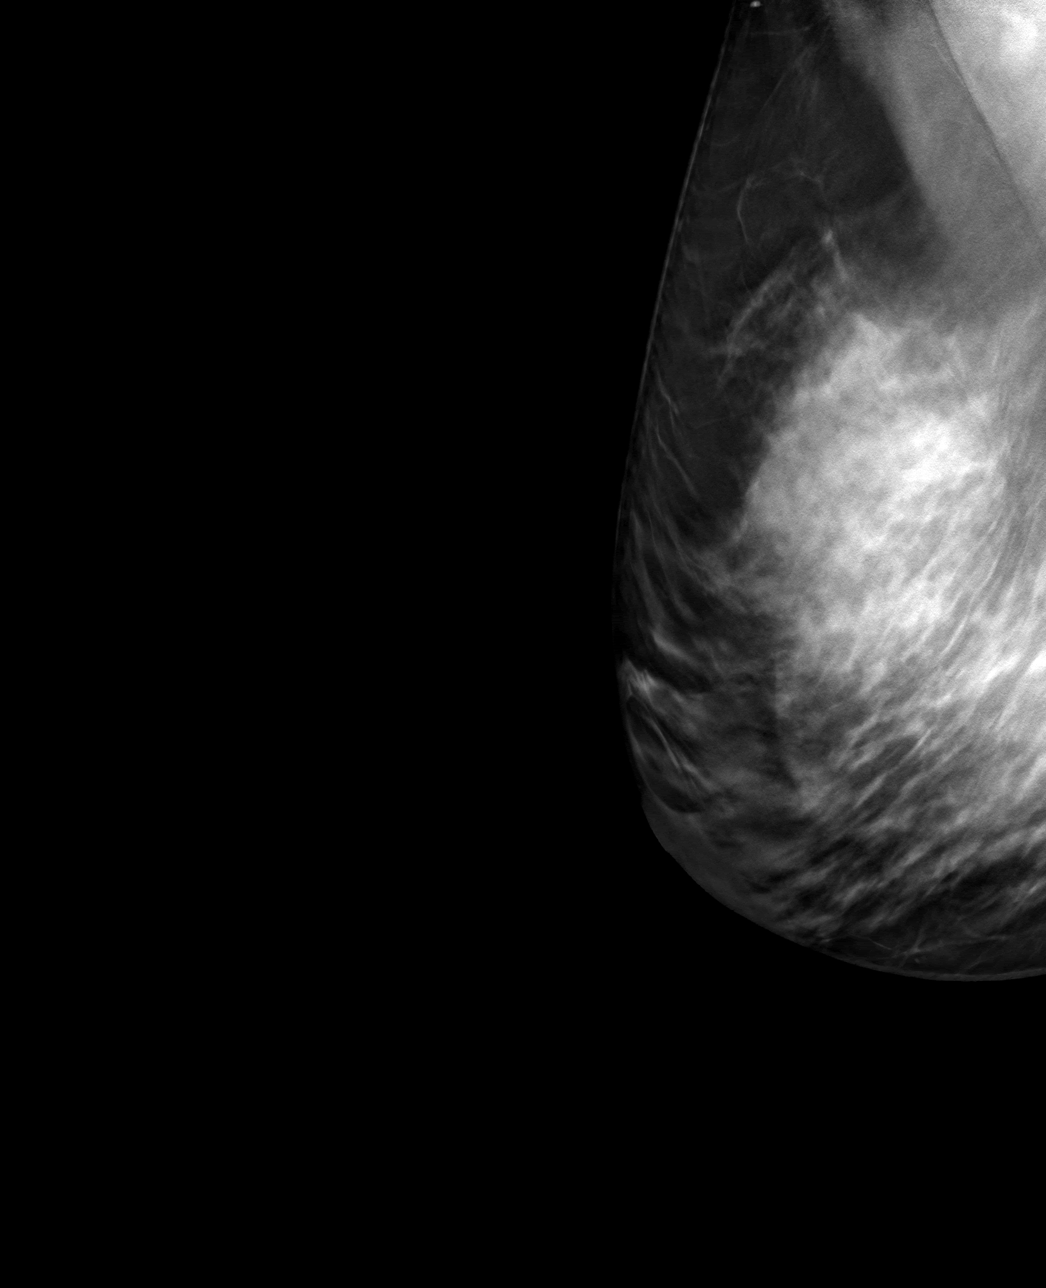

[R CC tomo · tomo slice 44/87.0]
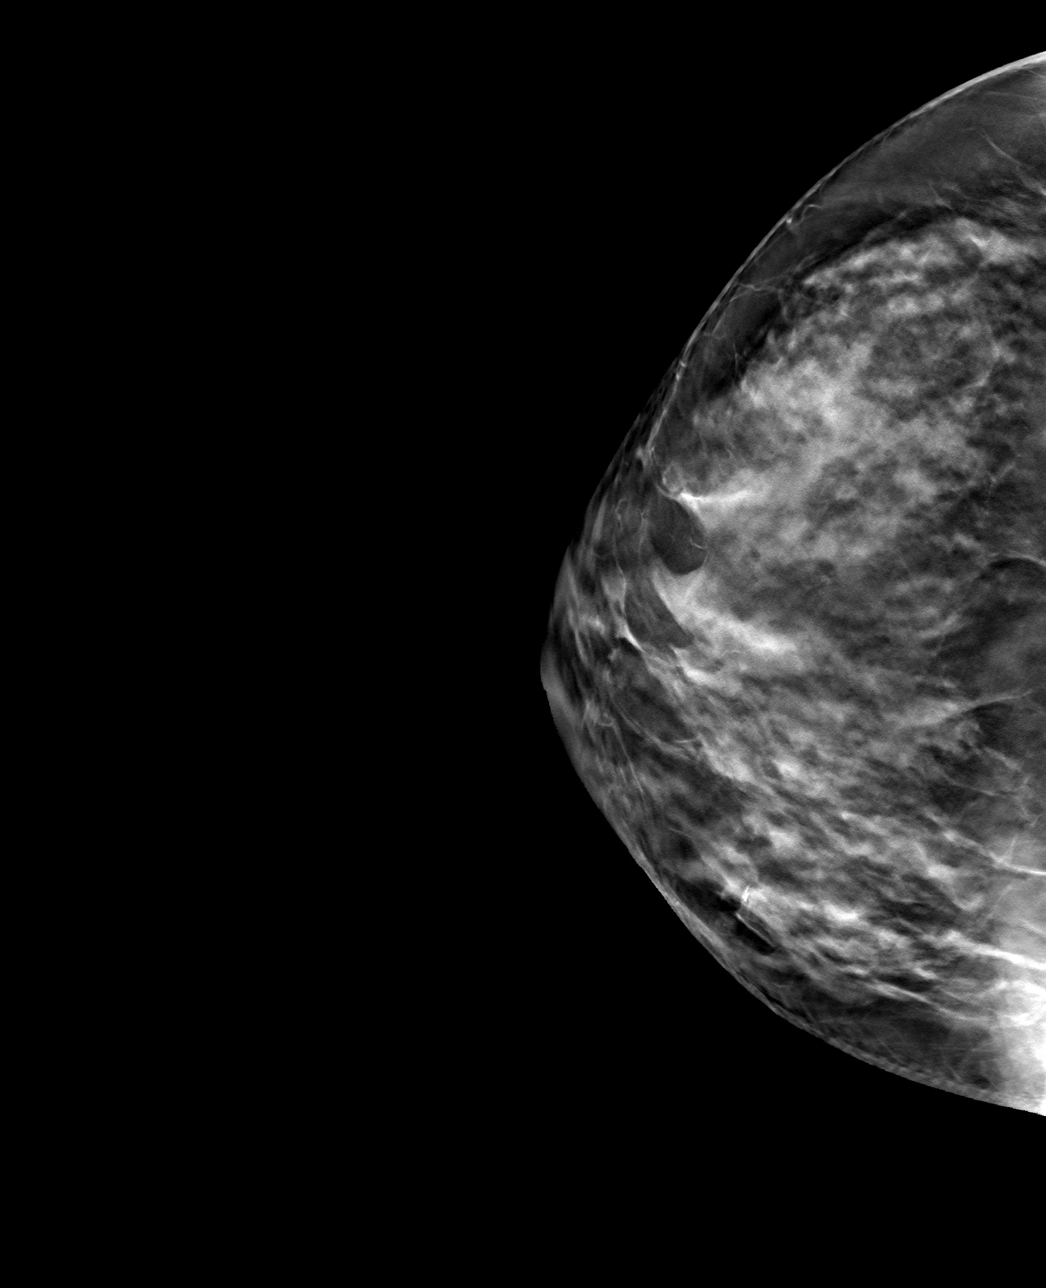

[4 of 12 positions shown; findings below may reference images not displayed]

FINDINGS: Mammographic images were obtained following stereotactic guided
biopsy of calcifications in the UPPER INNER QUADRANT of the RIGHT
breast and placement of a coil shaped clip. The biopsy marking clip
is in expected position at the site of biopsy.
IMPRESSION: Appropriate positioning of the coil shaped biopsy marking clip at
the site of biopsy in the UPPER INNER QUADRANT RIGHT breast.

Final Assessment: Post Procedure Mammograms for Marker Placement

## 2019-08-25 NOTE — Addendum Note (Signed)
Addended by: Darleen Crocker on: 08/25/2019 03:10 PM   Modules accepted: Orders

## 2019-08-25 NOTE — Telephone Encounter (Signed)
Called the patient back and advised that she has called in a medication for her to take at bedtime and would also recommend cognitive behavior therapy. Pt agreeable to the cognitive behavior therapy. Advised I will place that referral. In meantime she can try the other medication at bedtime and wait for the sleep lab to call to schedule for the titration study. Pt verbalized understanding. Pt had no questions at this time but was encouraged to call back if questions arise.

## 2019-08-27 ENCOUNTER — Other Ambulatory Visit: Payer: Self-pay | Admitting: Nurse Practitioner

## 2019-08-27 DIAGNOSIS — C50911 Malignant neoplasm of unspecified site of right female breast: Secondary | ICD-10-CM

## 2019-08-27 DIAGNOSIS — Z170 Estrogen receptor positive status [ER+]: Secondary | ICD-10-CM

## 2019-08-29 ENCOUNTER — Other Ambulatory Visit: Payer: Self-pay | Admitting: Nurse Practitioner

## 2019-08-29 DIAGNOSIS — E119 Type 2 diabetes mellitus without complications: Secondary | ICD-10-CM

## 2019-08-31 ENCOUNTER — Other Ambulatory Visit: Payer: Self-pay

## 2019-08-31 ENCOUNTER — Encounter: Payer: Self-pay | Admitting: *Deleted

## 2019-08-31 ENCOUNTER — Ambulatory Visit
Admission: RE | Admit: 2019-08-31 | Discharge: 2019-08-31 | Disposition: A | Discharge status: Home / Self Care | Payer: BC Managed Care – PPO | Source: Ambulatory Visit | Attending: Nurse Practitioner | Admitting: Nurse Practitioner

## 2019-08-31 DIAGNOSIS — C50911 Malignant neoplasm of unspecified site of right female breast: Secondary | ICD-10-CM

## 2019-08-31 DIAGNOSIS — D0511 Intraductal carcinoma in situ of right breast: Secondary | ICD-10-CM | POA: Insufficient documentation

## 2019-08-31 IMAGING — US US AXILLARY RIGHT
1 series · 7 of 7 positions shown · non-contrast
Comparison: Previous exam(s).

CLINICAL DATA: 54-year-old female with recently diagnosed RIGHT
breast cancer. Evaluate RIGHT axillary lymph nodes.

EXAM:
ULTRASOUND OF THE RIGHT AXILLA

[Series 1: us axillary right · 0.07mm/px · 7 of 7 slices shown]
[im 1/7]
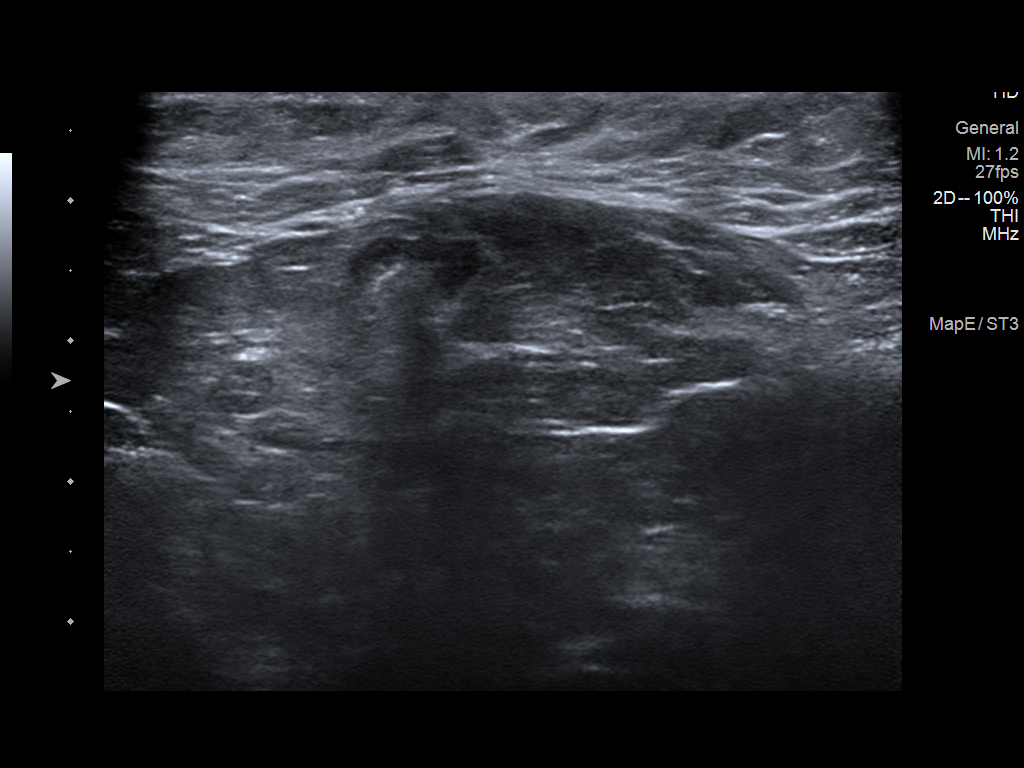
[im 2/7]
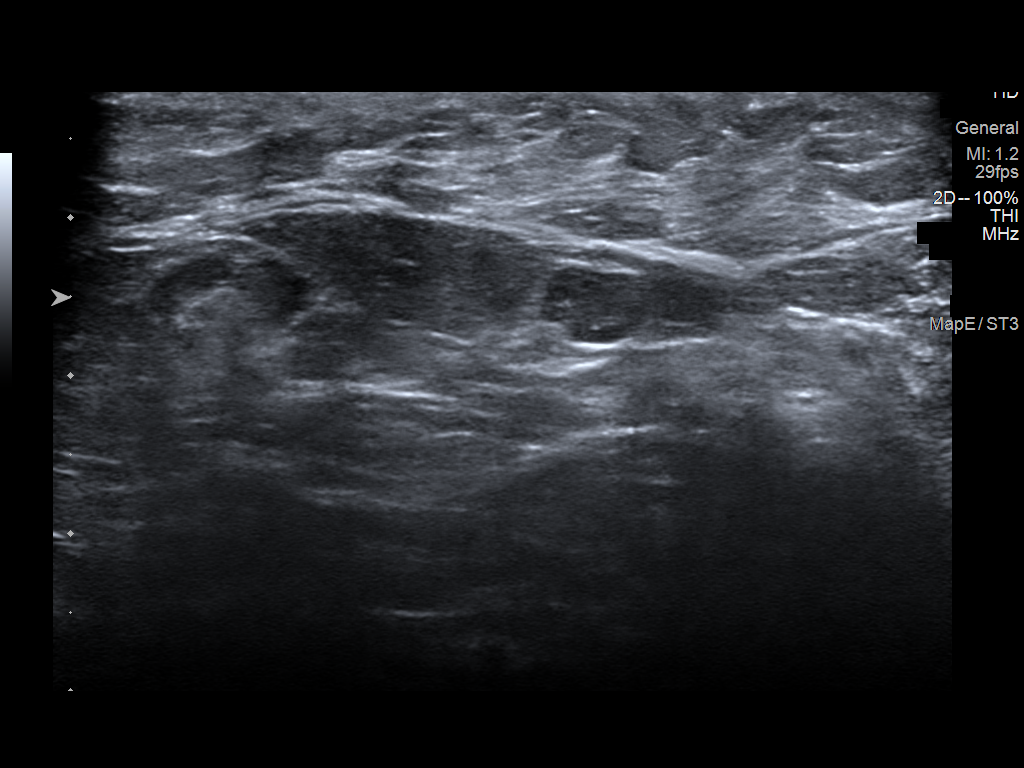
[im 3/7]
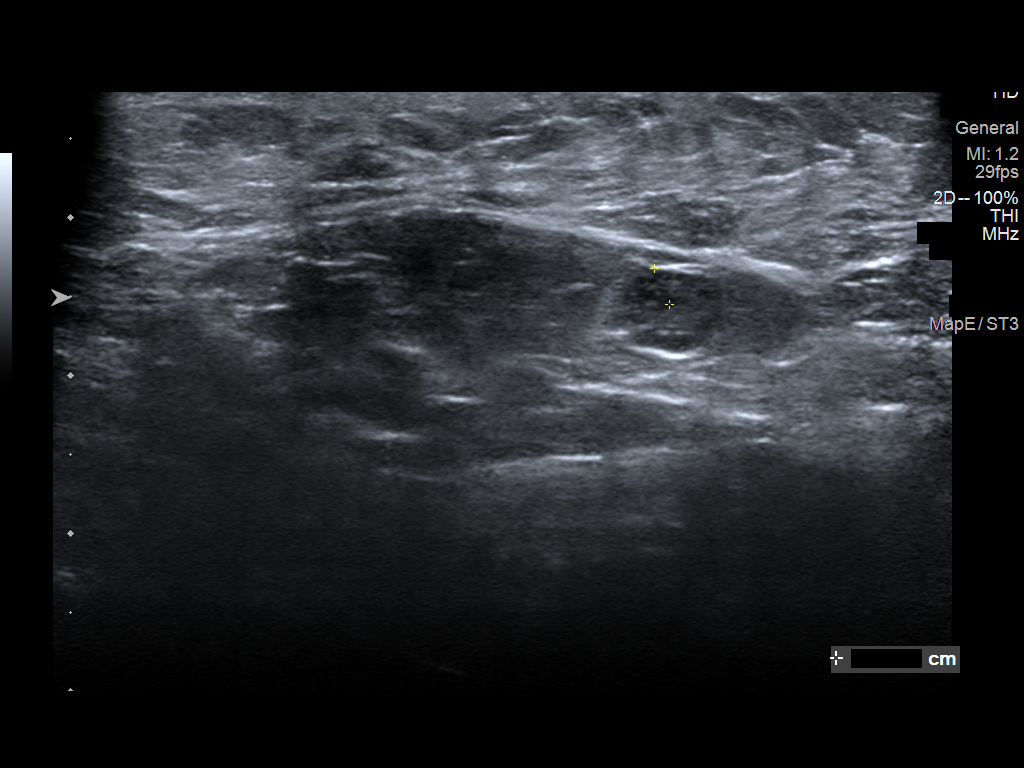
[im 4/7]
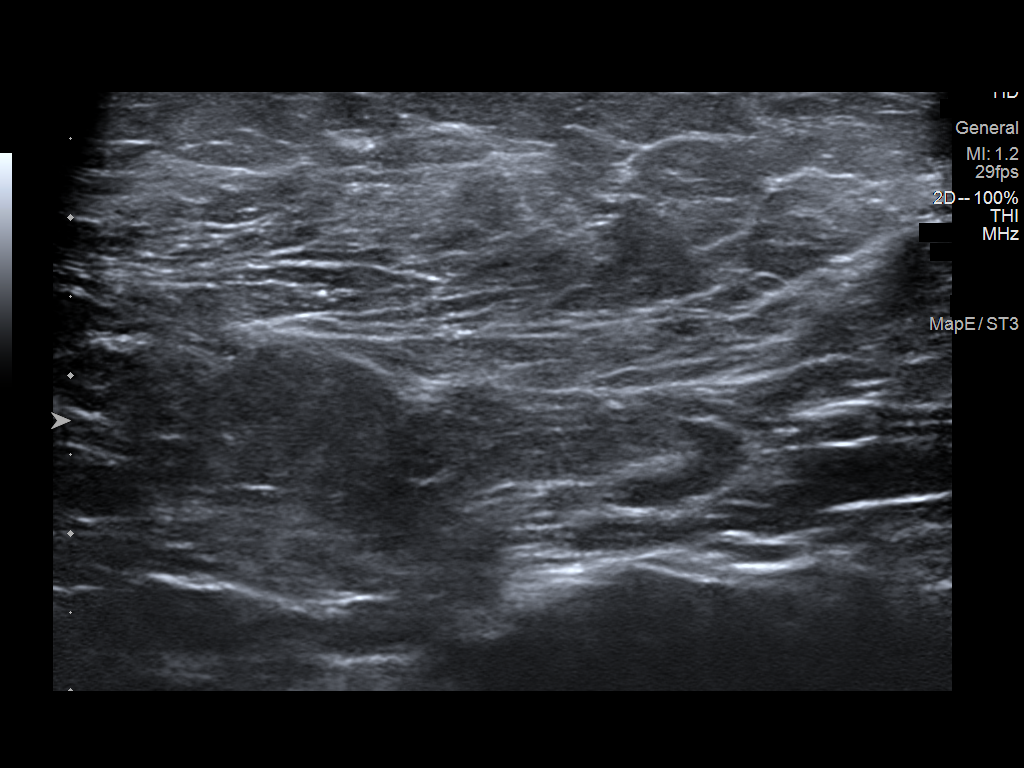
[im 5/7]
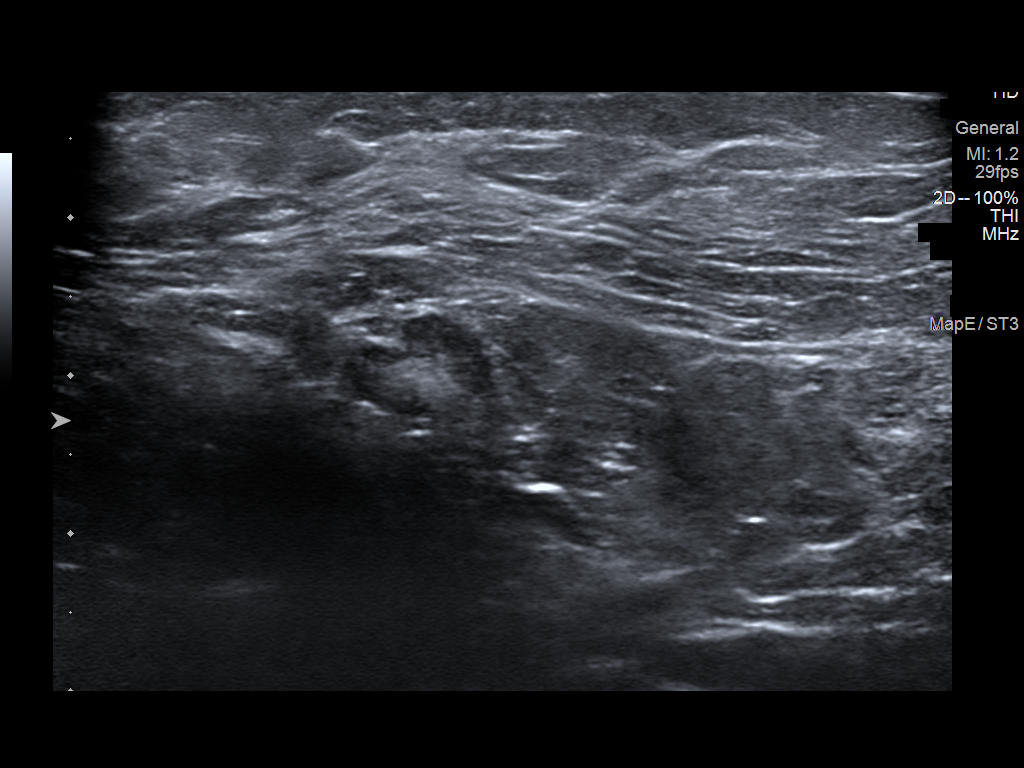
[im 6/7]
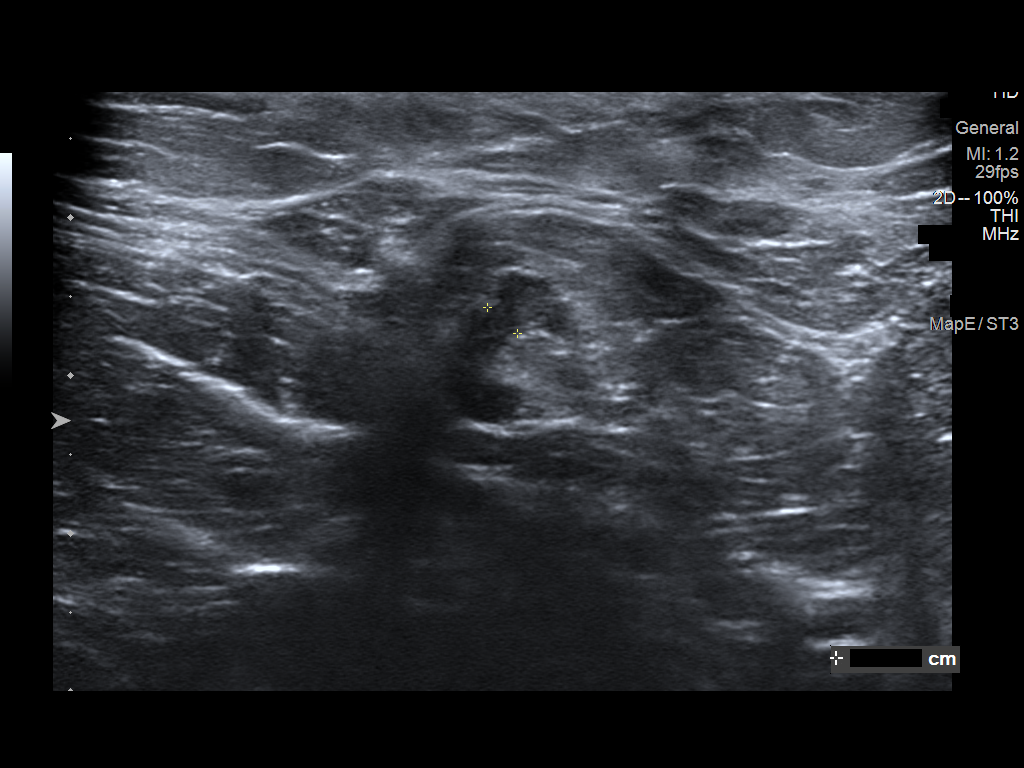
[im 7/7]
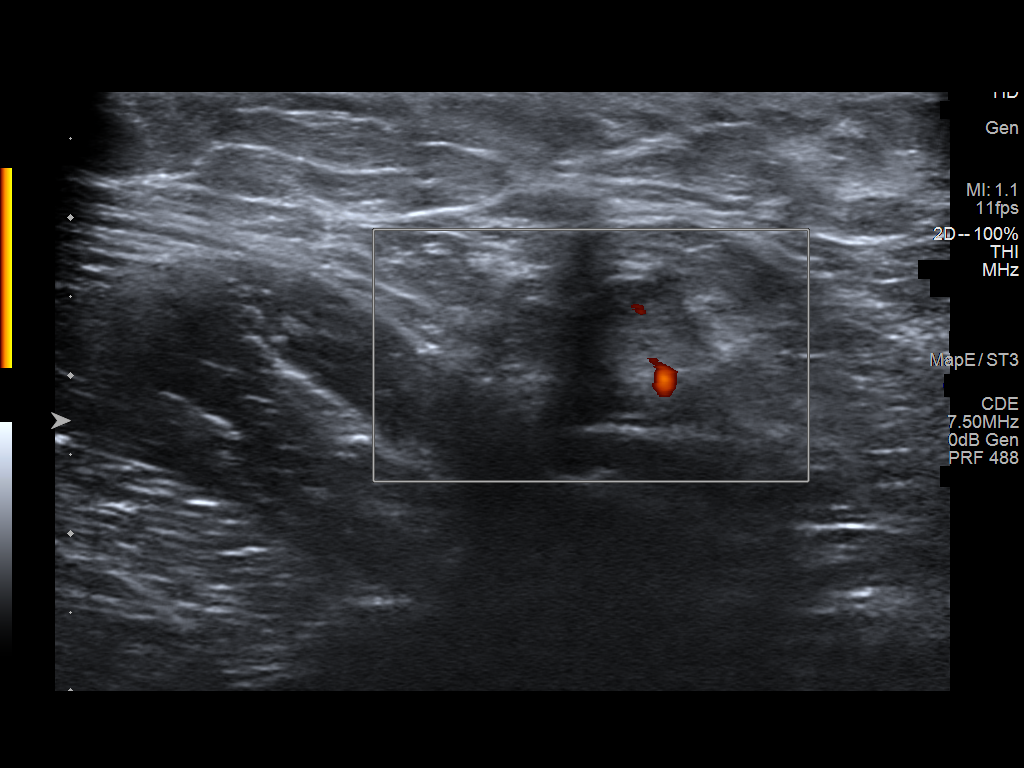

[7 of 7 positions shown; findings below may reference images not displayed]

FINDINGS: Ultrasound is performed, showing no abnormal appearing RIGHT
axillary lymph nodes.
IMPRESSION: No abnormal appearing RIGHT axillary lymph nodes.

RECOMMENDATION:
Treatment plan

I have discussed the findings and recommendations with the patient.
If applicable, a reminder letter will be sent to the patient
regarding the next appointment.

BI-RADS CATEGORY  1: Negative.

## 2019-08-31 NOTE — Progress Notes (Signed)
Vanessa Allen  [phone] [phone]     ID: Vanessa Allen DOB: 1965/02/04  MR#: [id number]  MLJ#:[id number]  Patient Care Team: [name], [place] as PCP - General (General Practice) Erroll Luna, MD as Consulting Physician (General Surgery) Magrinat, Virgie Dad, MD as Consulting Physician (Oncology) Kyung Rudd, MD as Consulting Physician (Radiation Oncology) Mauro Kaufmann, RN as Oncology Nurse Navigator Rockwell Germany, RN as Oncology Nurse Navigator Chauncey Cruel, MD OTHER MD:  CHIEF COMPLAINT: Ductal carcinoma in situ  CURRENT TREATMENT: Awaiting definitive surgery   HISTORY OF CURRENT ILLNESS: Vanessa Allen had routine screening mammography on 07/23/2019 showing a possible abnormality in the right breast. She underwent right diagnostic mammography with tomography at [facility] on 08/19/2019 showing: breast density category C; indeterminate 1.9 cm medial right breast calcifications.  Accordingly on 08/25/2019 she proceeded to biopsy of the right breast area in question. The pathology from this procedure ([id number]) showed: ductal carcinoma involving a papillary lesion. There is at least intermediate grade ductal carcinoma in situ, however, the lesion is fragmented hampering assessment and invasion can not be excluded. Lymph node sampling at the time of excision is recommended. Prognostic indicators significant for: estrogen receptor, 90% positive and progesterone receptor, 90% positive, both with strong staining intensity.  Right axilla ultrasound was performed on 08/31/2019 showing no abnormal-appearing lymph nodes.   The patient's subsequent history is as detailed below.   INTERVAL HISTORY: Vanessa Allen was evaluated in the multidisciplinary breast cancer clinic on 09/01/2019 accompanied by her Sister Vanessa Allen. Her case was also presented at the multidisciplinary breast cancer conference on the same day. At that time a preliminary  plan was proposed: Breast conserving surgery with sentinel lymph node sampling, antiestrogens, adjuvant radiation   REVIEW OF SYSTEMS: There were no specific symptoms leading to the original mammogram, which was routinely scheduled. On the provided questionnaire, [name] reports wearing glasses, back pain, arthritis, diabetes (type 2), and hot flashes. The patient denies unusual headaches, visual changes, nausea, vomiting, stiff neck, dizziness, or gait imbalance. There has been no cough, phlegm production, or pleurisy, no chest pain or pressure, and no change in bowel or bladder habits. The patient denies fever, rash, bleeding, unexplained fatigue or unexplained weight loss. A detailed review of systems was otherwise entirely negative.   PAST MEDICAL HISTORY: Past Medical History:  Diagnosis Date  . Diabetes mellitus without complication ([place])     PAST SURGICAL HISTORY: Past Surgical History:  Procedure Laterality Date  . APPENDECTOMY    . CHOLECYSTECTOMY    . MYOMECTOMY      FAMILY HISTORY: Family History  Problem Relation Age of Onset  . Hyperlipidemia Mother   . Hypertension Mother   . Cancer Father        stomach  . Diabetes Father   . Heart disease Father   . Hypertension Brother   . Hypertension Daughter   . Breast cancer Neg Hx    Her father died at age 30 after being diagnosed with stomach cancer. Her mother is age 67 (as of 08/2019). [name] has 3 brothers and 2 sisters. She reports ovarian cancer in two maternal aunts and breast cancer in a niece (brother's daughter) at age 72.   GYNECOLOGIC HISTORY:  No LMP recorded. Patient is postmenopausal. Menarche: 54 years old GX P 0 LMP age [age] (early menopause) Contraceptive never used HRT used for <1 year  Hysterectomy? No, only myomectomy BSO? 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 She is single. She lives at home by herelf.     ADVANCED DIRECTIVES: not in place. She intends to name one of her sisters, Vanessa Allen or Insurance account manager as healthcare power of attorney.   HEALTH MAINTENANCE: Social History   Tobacco Use  . Smoking status: Never Smoker  . Smokeless tobacco: Never Used  Substance Use Topics  . Alcohol use: Yes    Alcohol/week: 0.0 standard drinks    Comment: occasional  . Drug use: No     Colonoscopy: 11/2017, repeat due 2029  PAP: 06/2018, negative  Bone density: never done   No Known Allergies  Current Outpatient Medications  Medication Sig Dispense Refill  . ACCU-CHEK GUIDE test strip USE TWICE DAILY TO CHECK BLOOD SUGAR BEFORE BREAKFAST AND DINNER 100 strip 5  . atorvastatin (LIPITOR) 10 MG tablet TAKE 1 TABLET(10 MG) BY MOUTH DAILY 90 tablet 1  . Continuous Blood Gluc Receiver (FREESTYLE LIBRE 14 DAY READER) DEVI USE TO CHECK BLOOD SUGAR BEFORE MEALS AND AT BEDTIME 1 each 2  . Continuous Blood Gluc Sensor (FREESTYLE LIBRE 14 DAY SENSOR) MISC USE TO MEASURE BLOOD GLUCOSE FOUR TIMES DAILY BEFORE MEALS AND AT BEDTIME 2 each 11  . Glucose Blood (GLUCOSE METER TEST VI) by In Vitro route 2 (two) times daily.    . metFORMIN (GLUCOPHAGE) 500 MG tablet TAKE 1 TABLET BY MOUTH EVERY DAY WITH BREAKFAST 90 tablet 1  . Semaglutide (RYBELSUS) 7 MG TABS Take 1 tablet by mouth daily. 30 tablet 3  . ALPRAZolam (XANAX) 0.25 MG tablet Take 1 tablet (0.25 mg total) by mouth 3 (three) times daily as needed for anxiety. (Patient not taking: Reported on 09/01/2019) 30 tablet 0  . amitriptyline (ELAVIL) 25 MG tablet Take 1 tablet (25 mg total) by mouth at bedtime. (Patient not taking: Reported on 09/01/2019) 30 tablet 3  . cyclobenzaprine (FLEXERIL) 10 MG tablet Take 10 mg by mouth at bedtime.    . DAYVIGO 10 MG TABS Take by mouth.    . esomeprazole (NEXIUM) 20 MG capsule GENERIC FOR NEXIUM. TAKE 1 CAPSULE BY MOUTH DAILY AT LEAST 1 HOUR BEFORE A MEAL. SWALLOWING WHOLE. DO NOT CRUSH OR CHEW GRANULES 90 capsule 0  . estradiol  (VIVELLE-DOT) 0.025 MG/24HR APPLY TO SKIN TWICE WEEKLY AS DIRECTED 8 patch 1  . FARXIGA 10 MG TABS tablet Take 10 mg by mouth every morning.    . fluticasone (FLONASE) 50 MCG/ACT nasal spray as needed.  (Patient not taking: Reported on 09/01/2019)  0  . meloxicam (MOBIC) 15 MG tablet Take 15 mg by mouth daily.    . progesterone (PROMETRIUM) 100 MG capsule TAKE 2 CAPSULES BY MOUTH EVERY NIGHT AT BEDTIME FOR 2 WEEKS, THEN 3 CAPSULES EVERY NIGHT AT BEDTIME IF NEEDED. TAKE ON DAY 1 TO 27 OF EACH MONTH 81 capsule 1  . Suvorexant (BELSOMRA) 10 MG TABS Take 1 tablet by mouth at bedtime as needed. 30 tablet 0   No current facility-administered medications for this visit.    OBJECTIVE: African-American woman who appears younger than stated age  54:   09/01/19 1236  BP: 115/73  Pulse: 86  Resp: 20  Temp: 98.5 F (36.9 C)  SpO2: 99%     Body mass index is 32.26 kg/m.   Wt Readings from Last 3 Encounters:  09/01/19 199 lb 14.4 oz (90.7 kg)  08/19/19 178 lb (80.7 kg)  07/26/19 176 lb 9.6 oz (80.1 kg)  ECOG FS:1 - Symptomatic but completely ambulatory  Ocular: Sclerae unicteric, pupils round and equal Ear-nose-throat: Wearing a mask Lymphatic: No cervical or supraclavicular adenopathy Lungs no rales or rhonchi Heart regular rate and rhythm Abd soft, nontender, positive bowel sounds MSK no focal spinal tenderness, no joint edema Neuro: non-focal, well-oriented, appropriate affect Breasts: The right breast is status post recent biopsy.  There is a moderate ecchymosis.  The left breast is benign.  Both axillae are benign  LAB RESULTS:  CMP     Component Value Date/Time   NA 142 09/01/2019 1210   NA 145 (H) 07/26/2019 0931   K 3.5 09/01/2019 1210   CL 109 09/01/2019 1210   CO2 23 09/01/2019 1210   GLUCOSE 124 (H) 09/01/2019 1210   BUN 14 09/01/2019 1210   BUN 12 07/26/2019 0931   CREATININE 0.88 09/01/2019 1210   CALCIUM 9.7 09/01/2019 1210   PROT 7.3 09/01/2019 1210    PROT 7.2 07/26/2019 0931   ALBUMIN 4.2 09/01/2019 1210   ALBUMIN 4.5 07/26/2019 0931   AST 19 09/01/2019 1210   ALT 28 09/01/2019 1210   ALKPHOS 80 09/01/2019 1210   BILITOT 0.8 09/01/2019 1210   GFRNONAA >60 09/01/2019 1210   GFRAA >60 09/01/2019 1210    No results found for: TOTALPROTELP, ALBUMINELP, A1GS, A2GS, BETS, BETA2SER, GAMS, MSPIKE, SPEI  Lab Results  Component Value Date   WBC 4.4 09/01/2019   NEUTROABS 2.0 09/01/2019   HGB 13.1 09/01/2019   HCT 39.0 09/01/2019   MCV 81.6 09/01/2019   PLT 250 09/01/2019    No results found for: LABCA2  No components found for: [id number]  No results for input(s): INR in the last 168 hours.  No results found for: LABCA2  No results found for: CHY850  No results found for: YDX412  No results found for: INO676  No results found for: CA2729  No components found for: HGQUANT  No results found for: CEA1 / No results found for: CEA1   No results found for: AFPTUMOR  No results found for: CHROMOGRNA  No results found for: KPAFRELGTCHN, LAMBDASER, KAPLAMBRATIO (kappa/lambda light chains)  No results found for: HGBA, HGBA2QUANT, HGBFQUANT, HGBSQUAN (Hemoglobinopathy evaluation)   No results found for: LDH  No results found for: IRON, TIBC, IRONPCTSAT (Iron and TIBC)  No results found for: FERRITIN  Urinalysis    Component Value Date/Time   BILIRUBINUR negative 07/20/2018 1108   PROTEINUR Negative 07/20/2018 1108   UROBILINOGEN 0.2 07/20/2018 1108   NITRITE negative 07/20/2018 1108   LEUKOCYTESUR Negative 07/20/2018 1108     STUDIES: MM Digital Diagnostic Unilat R  Result Date: 08/19/2019 CLINICAL DATA:  The patient was called back for right breast calcifications. EXAM: DIGITAL DIAGNOSTIC RIGHT MAMMOGRAM COMPARISON:  Previous exam(s). ACR Breast Density Category c: The breast tissue is heterogeneously dense, which may obscure small masses. FINDINGS: The calcifications in the medial right breast persist on  additional imaging spanning up to 1.9 cm. IMPRESSION: Indeterminate right breast calcifications located medially. RECOMMENDATION: Stereotactic biopsy of the right breast calcifications. I have discussed the findings and recommendations with the patient. If applicable, a reminder letter will be sent to the patient regarding the next appointment. BI-RADS CATEGORY  4: Suspicious. Electronically Signed   By: Dorise Bullion III M.D   On: 08/19/2019 14:30   [place] AXILLA RIGHT  Result Date: 08/31/2019 CLINICAL DATA:  54 year old female with recently diagnosed RIGHT breast cancer. Evaluate RIGHT axillary lymph nodes. EXAM: ULTRASOUND OF THE RIGHT AXILLA COMPARISON:  Previous exam(s). FINDINGS: Ultrasound is performed, showing no abnormal appearing RIGHT axillary lymph nodes. IMPRESSION: No abnormal appearing RIGHT axillary lymph nodes. RECOMMENDATION: Treatment plan I have discussed the findings and recommendations with the patient. If applicable, a reminder letter will be sent to the patient regarding the next appointment. BI-RADS CATEGORY  1: Negative. Electronically Signed   By: Margarette Canada M.D.   On: 08/31/2019 09:52   MM CLIP PLACEMENT RIGHT  Result Date: 08/25/2019 CLINICAL DATA:  Status post stereotactic guided core biopsy of calcifications in the RIGHT breast. EXAM: DIAGNOSTIC RIGHT MAMMOGRAM POST STEREOTACTIC BIOPSY COMPARISON:  Previous exam(s). FINDINGS: Mammographic images were obtained following stereotactic guided biopsy of calcifications in the UPPER INNER QUADRANT of the RIGHT breast and placement of a coil shaped clip. The biopsy marking clip is in expected position at the site of biopsy. IMPRESSION: Appropriate positioning of the coil shaped biopsy marking clip at the site of biopsy in the UPPER INNER QUADRANT RIGHT breast. Final Assessment: Post Procedure Mammograms for Marker Placement Electronically Signed   By: Nolon Nations M.D.   On: 08/25/2019 12:17   MM RT BREAST BX W LOC DEV 1ST LESION  IMAGE BX SPEC STEREO GUIDE  Addendum Date: 08/27/2019   ADDENDUM REPORT: 08/27/2019 14:49 ADDENDUM: Pathology revealed INTERMEDIATE GRADE DUCTAL CARCINOMA INVOLVING A PAPILLARY LESION of the RIGHT breast, upper inner quadrant, (coil clip). The lesion is fragmented hampering assessment and invasion can not be excluded. Lymph node sampling at the time of excision is recommended. This was found to be concordant by Dr. Nolon Nations. Pathology results were discussed with the patient by telephone. The patient reported doing well after the biopsy with tenderness and bleeding at the site. Post biopsy instructions and care were reviewed and questions were answered. The patient was encouraged to call [facility] for any additional concerns. My direct phone number was provided. The patient was referred to [facility] on September 01, 2019. The patient is scheduled for a RIGHT axillary ultrasound on August 31, 2019 for further evaluation of the lymph nodes due to the potential invasive component. Pathology results reported by Terie Purser, RN on 08/27/2019. Electronically Signed   By: Nolon Nations M.D.   On: 08/27/2019 14:49   Result Date: 08/27/2019 CLINICAL DATA:  And presents for stereotactic guided core biopsy of calcifications in the RIGHT breast. EXAM: RIGHT BREAST STEREOTACTIC CORE NEEDLE BIOPSY COMPARISON:  Previous exams. FINDINGS: The patient and I discussed the procedure of stereotactic-guided biopsy including benefits and alternatives. We discussed the high likelihood of a successful procedure. We discussed the risks of the procedure including infection, bleeding, tissue injury, clip migration, and inadequate sampling. Informed written consent was given. The usual time out protocol was performed immediately prior to the procedure. Using sterile technique and 1% lidocaine and 1% lidocaine with epinephrine as  local anesthetic, under stereotactic guidance, a 9 gauge vacuum assisted device was used to perform core needle biopsy of calcifications in the UPPER INNER QUADRANT of the RIGHT breast using a MEDIAL to LATERAL approach. Specimen radiograph was performed showing calcifications in numerous tissue samples. Specimens with calcifications are identified for pathology. Lesion quadrant: Upper inner quadrant At the conclusion of the procedure, coil shaped tissue marker clip was deployed into the biopsy cavity. Follow-up 2-view mammogram was performed and dictated separately. IMPRESSION: Stereotactic-guided biopsy of RIGHT breast calcifications. No apparent complications. Electronically Signed: By: Nolon Nations M.D. On: 08/25/2019 12:09  ELIGIBLE FOR AVAILABLE RESEARCH PROTOCOL: AET  ASSESSMENT: 54 y.o. [place] woman status post right breast biopsy 08/25/2019 for ductal carcinoma in situ, grade 2 or 3, estrogen and progesterone receptor positive  (1) genetics testing  (2) definitive surgery pending  (3) adjuvant radiation  (4) antiestrogens  PLAN: I met today with Vanessa Allen to review her new diagnosis. Specifically we discussed the biology of her breast cancer, its diagnosis, staging, treatment  options and prognosis. Vanessa Allen understands that in noninvasive ductal carcinoma, also called ductal carcinoma in situ ("DCIS") the breast cancer cells remain trapped in the ducts were they started. They cannot travel to a vital organ. For that reason these cancers in themselves are not life-threatening.  If the whole breast is removed then all the ducts are removed and since the cancer cells are trapped in the ducts, the cure rate with mastectomy for noninvasive breast cancer is approximately 99%. Nevertheless we recommend lumpectomy, because there is no survival advantage to mastectomy and because the cosmetic result is generally superior with breast conservation.  Since the patient is keeping her  breasts, there will be some risk of recurrence. The recurrence can only be in the same breast since, again, the cells are trapped in the ducts. There is no connection from one breast to the other. The risk of local recurrence is cut by more than half with radiation, which is standard in this situation.  In estrogen receptor positive cancers like '@FTNAME' @'s, anti-estrogens can also be considered. They will further reduce the risk of recurrence by one half. In addition anti-estrogens will lower the risk of a new breast cancer developing in either breast, also by one half. That risk otherwise approaches 1% per year.   Accordingly the overall plan is for surgery, followed by radiation, then a discussion of anti-estrogens.  The patient also qualifies for genetics testing. In patients who carry a deleterious mutation [for example in a  BRCA gene], the risk of a new breast cancer developing in the future may be sufficiently great that the patient may choose bilateral mastectomies. However if she wishes to keep her breasts in that situation it is safe to do so. That would require intensified screening, which generally means not only yearly mammography but a yearly breast MRI as well.   Vanessa Allen has a good understanding of the overall plan. She agrees with it. She knows the goal of treatment in her case is cure. She will call with any problems that may develop before her next visit here.  Total encounter time 55 minutes.   Virgie Dad. Magrinat, MD 09/01/2019 6:56 PM Medical Oncology and Hematology [facility] [place], [place] [address] Tel. [phone]    Fax. [phone]   This document serves as a record of services personally performed by Lurline Del, MD. It was created on his behalf by Wilburn Mylar, a trained medical scribe. The creation of this record is based on the scribe's personal observations and the provider's statements to them.   I, Lurline Del MD,  have reviewed the above documentation for accuracy and completeness, and I agree with the above.    *Total Encounter Time as defined by the Centers for Medicare and Medicaid Services includes, in addition to the face-to-face time of a patient visit (documented in the note above) non-face-to-face time: obtaining and reviewing outside history, ordering and reviewing medications, tests or procedures, care coordination (communications with other health care professionals or caregivers) and documentation in the medical record.

## 2019-09-01 ENCOUNTER — Encounter: Payer: Self-pay | Admitting: *Deleted

## 2019-09-01 ENCOUNTER — Ambulatory Visit: Payer: Self-pay | Admitting: Surgery

## 2019-09-01 ENCOUNTER — Other Ambulatory Visit: Payer: Self-pay

## 2019-09-01 ENCOUNTER — Encounter: Payer: Self-pay | Admitting: Physical Therapy

## 2019-09-01 ENCOUNTER — Ambulatory Visit
Admission: RE | Admit: 2019-09-01 | Discharge: 2019-09-01 | Disposition: A | Discharge status: Home / Self Care | Payer: BC Managed Care – PPO | Source: Ambulatory Visit | Attending: Radiation Oncology | Admitting: Radiation Oncology

## 2019-09-01 ENCOUNTER — Inpatient Hospital Stay: Payer: BC Managed Care – PPO

## 2019-09-01 ENCOUNTER — Inpatient Hospital Stay: Payer: BC Managed Care – PPO | Attending: Oncology | Admitting: Oncology

## 2019-09-01 ENCOUNTER — Ambulatory Visit: Payer: BC Managed Care – PPO | Attending: Surgery | Admitting: Physical Therapy

## 2019-09-01 ENCOUNTER — Ambulatory Visit (HOSPITAL_BASED_OUTPATIENT_CLINIC_OR_DEPARTMENT_OTHER): Payer: BC Managed Care – PPO | Admitting: Genetic Counselor

## 2019-09-01 VITALS — BP 115/73 | HR 86 | Temp 98.5°F | Resp 20 | Ht 66.0 in | Wt 199.9 lb

## 2019-09-01 DIAGNOSIS — C50911 Malignant neoplasm of unspecified site of right female breast: Secondary | ICD-10-CM

## 2019-09-01 DIAGNOSIS — R293 Abnormal posture: Secondary | ICD-10-CM | POA: Diagnosis not present

## 2019-09-01 DIAGNOSIS — Z8041 Family history of malignant neoplasm of ovary: Secondary | ICD-10-CM

## 2019-09-01 DIAGNOSIS — Z803 Family history of malignant neoplasm of breast: Secondary | ICD-10-CM | POA: Insufficient documentation

## 2019-09-01 DIAGNOSIS — Z8481 Family history of carrier of genetic disease: Secondary | ICD-10-CM

## 2019-09-01 DIAGNOSIS — D0511 Intraductal carcinoma in situ of right breast: Secondary | ICD-10-CM

## 2019-09-01 DIAGNOSIS — Z800 Family history of malignant neoplasm of digestive organs: Secondary | ICD-10-CM | POA: Insufficient documentation

## 2019-09-01 DIAGNOSIS — Z170 Estrogen receptor positive status [ER+]: Secondary | ICD-10-CM | POA: Diagnosis not present

## 2019-09-01 DIAGNOSIS — Z806 Family history of leukemia: Secondary | ICD-10-CM

## 2019-09-01 LAB — CMP (CANCER CENTER ONLY)
ALT: 28 U/L (ref 0–44)
AST: 19 U/L (ref 15–41)
Albumin: 4.2 g/dL (ref 3.5–5.0)
Alkaline Phosphatase: 80 U/L (ref 38–126)
Anion gap: 10 (ref 5–15)
BUN: 14 mg/dL (ref 6–20)
CO2: 23 mmol/L (ref 22–32)
Calcium: 9.7 mg/dL (ref 8.9–10.3)
Chloride: 109 mmol/L (ref 98–111)
Creatinine: 0.88 mg/dL (ref 0.44–1.00)
GFR, Est AFR Am: >60 mL/min (ref >60)
GFR, Estimated: >60 mL/min (ref >60)
Glucose, Bld: 124 mg/dL — ABNORMAL HIGH (ref 70–99)
Potassium: 3.5 mmol/L (ref 3.5–5.1)
Sodium: 142 mmol/L (ref 135–145)
Total Bilirubin: 0.8 mg/dL (ref 0.3–1.2)
Total Protein: 7.3 g/dL (ref 6.5–8.1)

## 2019-09-01 LAB — CBC WITH DIFFERENTIAL (CANCER CENTER ONLY)
Abs Immature Granulocytes: 0.02 10*3/uL (ref 0.00–0.07)
Basophils Absolute: 0 10*3/uL (ref 0.0–0.1)
Basophils Relative: 1 %
Eosinophils Absolute: 0.1 10*3/uL (ref 0.0–0.5)
Eosinophils Relative: 2 %
HCT: 39 % (ref 36.0–46.0)
Hemoglobin: 13.1 g/dL (ref 12.0–15.0)
Immature Granulocytes: 1 %
Lymphocytes Relative: 46 %
Lymphs Abs: 2.1 10*3/uL (ref 0.7–4.0)
MCH: 27.4 pg (ref 26.0–34.0)
MCHC: 33.6 g/dL (ref 30.0–36.0)
MCV: 81.6 fL (ref 80.0–100.0)
Monocytes Absolute: 0.3 10*3/uL (ref 0.1–1.0)
Monocytes Relative: 6 %
Neutro Abs: 2 10*3/uL (ref 1.7–7.7)
Neutrophils Relative %: 44 %
Platelet Count: 250 10*3/uL (ref 150–400)
RBC: 4.78 MIL/uL (ref 3.87–5.11)
RDW: 13 % (ref 11.5–15.5)
WBC Count: 4.4 10*3/uL (ref 4.0–10.5)
nRBC: 0 % (ref 0.0–0.2)

## 2019-09-01 LAB — GENETIC SCREENING ORDER

## 2019-09-01 NOTE — H&P (Signed)
Vanessa Allen Appointment: 09/01/2019 1:00 PM Location: [facility] Patient #: [id number] DOB: 12-May-1965 Single / Language: Vanessa Allen / Race: White Female  History of Present Illness Vanessa Moores A. Ling Flesch MD; 09/01/2019 3:26 PM) Patient words: Pt present to [facility] for evaluation of right breast DCIS. No hx of mass discharge or change to either breast. 1.9 cm calcifications right breast noted medial upper quadrant. Core bx DCIS with possible invasive component           CLINICAL DATA: The patient was called back for right breast calcifications.  EXAM: DIGITAL DIAGNOSTIC RIGHT MAMMOGRAM  COMPARISON: Previous exam(s).  ACR Breast Density Category c: The breast tissue is heterogeneously dense, which may obscure small masses.  FINDINGS: The calcifications in the medial right breast persist on additional imaging spanning up to 1.9 cm.  IMPRESSION: Indeterminate right breast calcifications located medially.  RECOMMENDATION: Stereotactic biopsy of the right breast calcifications.  I have discussed the findings and recommendations with the patient. If applicable, a reminder letter will be sent to the patient regarding the next appointment.  BI-RADS CATEGORY 4: Suspicious.   Electronically Signed By: Vanessa Allen M.D On: 08/19/2019 14:30      ADDITIONAL INFORMATION: PROGNOSTIC INDICATORS Results: IMMUNOHISTOCHEMICAL AND MORPHOMETRIC ANALYSIS PERFORMED MANUALLY Estrogen Receptor: 90%, POSITIVE, STRONG STAINING INTENSITY Progesterone Receptor: 90%, POSITIVE, STRONG STAINING INTENSITY REFERENCE RANGE ESTROGEN RECEPTOR NEGATIVE 0% POSITIVE =>1% REFERENCE RANGE PROGESTERONE RECEPTOR NEGATIVE 0% POSITIVE =>1% All controls stained appropriately Vanessa Males MD Pathologist, Electronic Signature ( Signed 08/30/2019) FINAL DIAGNOSIS Diagnosis Breast, right, needle core biopsy, UIQ, coil - DUCTAL CARCINOMA INVOLVING A PAPILLARY LESION, SEE  COMMENT. 1 of 2 FINAL for Vanessa Allen [phone]) Microscopic Comment There is at least intermediate grade ductal carcinoma in situ, however, the lesion is fragmented hampering assessment and invasion can not be excluded. Lymph node sampling at the time of excision is recommended. Prognostic will be ordered. Dr. Jeannie Allen has reviewed the case. The case was called to [facility] on 08/27/19. Vanessa Males MD Pathologist, Electronic Signature (Case signed 08/27/2019).  The patient is a 54 year old female.   Past Surgical History Vanessa Slipper, RN; 09/01/2019 8:17 AM) Appendectomy Breast Biopsy Right. Foot Surgery Left, Right. Gallbladder Surgery - Laparoscopic Oral Surgery  Diagnostic Studies History Vanessa Slipper, RN; 09/01/2019 8:17 AM) Mammogram within last year 1-3 years ago Pap Smear 1-5 years ago  Medication History Vanessa Slipper, RN; 09/01/2019 8:16 AM) Medications Reconciled  Social History Vanessa Slipper, RN; 09/01/2019 8:17 AM) Caffeine use Carbonated beverages, Coffee, Tea. No alcohol use Tobacco use Never smoker.  Family History Vanessa Slipper, RN; 09/01/2019 8:17 AM) Arthritis Mother, Sister. Diabetes Mellitus Father. Heart Disease Father, Mother. Hypertension Brother, Mother, Sister. Kidney Disease Father. Seizure disorder Brother.  Pregnancy / Birth History Vanessa Slipper, RN; 09/01/2019 8:17 AM) Age at menarche 65 years, 60 years. Age of menopause <47 Contraceptive History Oral contraceptives. Gravida 0 Para 0 Regular periods  Other Problems Vanessa Slipper, RN; 09/01/2019 8:17 AM) Back Pain Cholelithiasis Diabetes Mellitus Gastroesophageal Reflux Disease     Review of Systems (Vanessa Allen A. Sharlize Hoar MD; 09/01/2019 3:26 PM) General Not Present- Appetite Loss, Chills, Fatigue, Fever, Night Sweats, Weight Gain and Weight Loss. Skin Not Present- Change in Wart/Mole, Dryness, Hives, Jaundice, New Lesions, Non-Healing Wounds,  Rash and Ulcer. HEENT Present- Wears glasses/contact lenses. Not Present- Earache, Hearing Loss, Hoarseness, Nose Bleed, Oral Ulcers, Ringing in the Ears, Seasonal Allergies, Sinus Pain, Sore Throat, Visual Disturbances and Yellow Eyes. Respiratory Present- Snoring.  Not Present- Bloody sputum, Chronic Cough, Difficulty Breathing and Wheezing. Breast Not Present- Breast Mass, Breast Pain, Nipple Discharge and Skin Changes. Cardiovascular Not Present- Chest Pain, Difficulty Breathing Lying Down, Leg Cramps, Palpitations, Rapid Heart Rate, Shortness of Breath and Swelling of Extremities. Gastrointestinal Not Present- Abdominal Pain, Bloating, Bloody Stool, Change in Bowel Habits, Chronic diarrhea, Constipation, Difficulty Swallowing, Excessive gas, Gets full quickly at meals, Hemorrhoids, Indigestion, Nausea, Rectal Pain and Vomiting. Female Genitourinary Not Present- Frequency, Nocturia, Painful Urination, Pelvic Pain and Urgency. Musculoskeletal Present- Back Pain. Not Present- Joint Pain, Joint Stiffness, Muscle Pain, Muscle Weakness and Swelling of Extremities. Neurological Not Present- Decreased Memory, Fainting, Headaches, Numbness, Seizures, Tingling, Tremor, Trouble walking and Weakness. Psychiatric Present- Change in Sleep Pattern. Not Present- Anxiety, Bipolar, Depression, Fearful and Frequent crying. Endocrine Not Present- Cold Intolerance, Excessive Hunger, Hair Changes, Heat Intolerance, Hot flashes and New Diabetes. Hematology Present- Blood Thinners. Not Present- Easy Bruising, Excessive bleeding, Gland problems, HIV and Persistent Infections. All other systems negative   Physical Exam (Vanessa Dieter A. Marlyn Rabine MD; 09/01/2019 3:26 PM)  General Mental Status-Alert. General Appearance-Consistent with stated age. Hydration-Well hydrated. Voice-Normal.  Head and Neck Head-normocephalic, atraumatic with no lesions or palpable masses. Trachea-midline. Thyroid Gland Characteristics  - normal size and consistency.  Eye Eyeball - Bilateral-Extraocular movements intact. Sclera/Conjunctiva - Bilateral-No scleral icterus.  Chest and Lung Exam Chest and lung exam reveals -quiet, even and easy respiratory effort with no use of accessory muscles and on auscultation, normal breath sounds, no adventitious sounds and normal vocal resonance. Inspection Chest Wall - Normal. Back - normal.  Breast Breast - Left-Symmetric, Non Tender, No Biopsy scars, no Dimpling - Left, No Inflammation, No Lumpectomy scars, No Mastectomy scars, No Peau d' Orange. Breast - Right-Symmetric, Non Tender, No Biopsy scars, no Dimpling - Right, No Inflammation, No Lumpectomy scars, No Mastectomy scars, No Peau d' Orange. Breast Lump-No Palpable Breast Mass.  Cardiovascular Cardiovascular examination reveals -normal heart sounds, regular rate and rhythm with no murmurs and normal pedal pulses bilaterally.  Abdomen Inspection Inspection of the abdomen reveals - No Hernias. Skin - Scar - no surgical scars. Palpation/Percussion Palpation and Percussion of the abdomen reveal - Soft, Non Tender, No Rebound tenderness, No Rigidity (guarding) and No hepatosplenomegaly. Auscultation Auscultation of the abdomen reveals - Bowel sounds normal.  Neurologic Neurologic evaluation reveals -alert and oriented x 3 with no impairment of recent or remote memory. Mental Status-Normal.  Musculoskeletal Normal Exam - Left-Upper Extremity Strength Normal and Lower Extremity Strength Normal. Normal Exam - Right-Upper Extremity Strength Normal and Lower Extremity Strength Normal.  Lymphatic Head & Neck  General Head & Neck Lymphatics: Bilateral - Description - Normal. Axillary  General Axillary Region: Bilateral - Description - Normal. Tenderness - Non Tender. Femoral & Inguinal  Generalized Femoral & Inguinal Lymphatics: Bilateral - Description - Normal. Tenderness - Non  Tender.    Assessment & Plan (Merri Dimaano A. Dave Mannes MD; 09/01/2019 3:28 PM)  DUCTAL CARCINOMA IN SITU (DCIS) OF RIGHT BREAST WITH MICROINVASIVE COMPONENT (D05.11) Impression: pt opted for right breast lumpectomy and SLN mapping due to microinvasive component Risk of lumpectomy include bleeding, infection, seroma, more surgery, use of seed/wire, wound care, cosmetic deformity and the need for other treatments, death , blood clots, death. Pt agrees to proceed. Risk of sentinel lymph node mapping include bleeding, infection, lymphedema, shoulder pain. stiffness, dye allergy. lymphedema cosmetic deformity , blood clots, death, need for more surgery. Pt agrees to proceed.  Current Plans You are being scheduled for surgery-  Our schedulers will call you.  You should hear from our office's scheduling department within 5 working days about the location, date, and time of surgery. We try to make accommodations for patient's preferences in scheduling surgery, but sometimes the OR schedule or the surgeon's schedule prevents [place] from making those accommodations.  If you have not heard from our office [phone]) in 5 working days, call the office and ask for your surgeon's nurse.  If you have other questions about your diagnosis, plan, or surgery, call the office and ask for your surgeon's nurse.  Pt Education - [facility] Breast Cancer Information Given - [facility] "Breast Journey" Package We discussed the staging and pathophysiology of breast cancer. We discussed all of the different options for treatment for breast cancer including surgery, chemotherapy, radiation therapy, Herceptin, and antiestrogen therapy. We discussed a sentinel lymph node biopsy as she does not appear to having lymph node involvement right now. We discussed the performance of that with injection of radioactive tracer and blue dye. We discussed that she would have an incision underneath her axillary hairline. We discussed that there is a bout a  10-20% chance of having a positive node with a sentinel lymph node biopsy and we will await the permanent pathology to make any other first further decisions in terms of her treatment. One of these options might be to return to the operating room to perform an axillary lymph node dissection. We discussed about a 1-2% risk lifetime of chronic shoulder pain as well as lymphedema associated with a sentinel lymph node biopsy. We discussed the options for treatment of the breast cancer which included lumpectomy versus a mastectomy. We discussed the performance of the lumpectomy with a wire placement. We discussed a 10-20% chance of a positive margin requiring reexcision in the operating room. We also discussed that she may need radiation therapy or antiestrogen therapy or both if she undergoes lumpectomy. We discussed the mastectomy and the postoperative care for that as well. We discussed that there is no difference in her survival whether she undergoes lumpectomy with radiation therapy or antiestrogen therapy versus a mastectomy. There is a slight difference in the local recurrence rate being 3-5% with lumpectomy and about 1% with a mastectomy. We discussed the risks of operation including bleeding, infection, possible reoperation. She understands her further therapy will be based on what her stages at the time of her operation.  Pt Education - [facility] breast cancer surgery: discussed with patient and provided information.

## 2019-09-01 NOTE — Therapy (Signed)
[place], [place], [address] Phone: [phone]   Fax:  [phone]  Physical Therapy Evaluation  Patient Details  Name: Vanessa Allen MRN: [id number] Date of Birth: 25-Jun-1965 Referring Provider (PT): Dr. Erroll Luna   Encounter Date: 09/01/2019   PT End of Session - 09/01/19 1752    Visit Number 1    Number of Visits 2    Date for PT Re-Evaluation 10/27/19    PT Start Time 0932    PT Stop Time 1350    PT Time Calculation (min) 33 min    Activity Tolerance Patient tolerated treatment well    Behavior During Therapy [facility] for tasks assessed/performed           Past Medical History:  Diagnosis Date  . Diabetes mellitus without complication [facility])     Past Surgical History:  Procedure Laterality Date  . APPENDECTOMY    . CHOLECYSTECTOMY    . MYOMECTOMY      There were no vitals filed for this visit.    Subjective Assessment - 09/01/19 1746    Subjective Patient reports she is here today to be seen by her medical team for her newly diagnosed right breast cancer.    Patient is accompained by: Family member    Pertinent History Patient was diagnosed on 07/23/2019 with right intermediate grade DCIS with concerns for possible invasive disease. It measures 1.9 cm and is located in the upper inner quadrant. It is ER/PR positive.    Patient Stated Goals Reduce lymphedema risk and learn post op shoulder ROM HEP    Currently in Pain? No/denies              [facility] PT Assessment - 09/01/19 0001      Assessment   Medical Diagnosis Right breast cancer    Referring Provider (PT) Dr. Marcello Moores [place]    Onset Date/Surgical Date 07/23/19    Hand Dominance Left    Prior Therapy none      Precautions   Precautions Other (comment)    Precaution Comments active cancer      Restrictions   Weight Bearing Restrictions No      Balance Screen   Has the patient fallen in the past 6 months No     Has the patient had a decrease in activity level because of a fear of falling?  No    Is the patient reluctant to leave their home because of a fear of falling?  No      Home Environment   Living Environment Private residence    Living Arrangements Alone    Available Help at Discharge Family      Prior Function   Level of Independence Independent    Vocation Full time employment    Vocation Requirements travel nurse    Leisure She does not exercise      Cognition   Overall Cognitive Status Within Functional Limits for tasks assessed      Posture/Postural Control   Posture/Postural Control Postural limitations    Postural Limitations Rounded Shoulders;Forward head      ROM / Strength   AROM / PROM / Strength AROM;Strength      AROM   Overall AROM Comments Right cervical sidebending and left rotation limited 25%; others WNL    AROM Assessment Site Shoulder    Right/Left Shoulder Right;Left    Right Shoulder Extension 55 Degrees    Right Shoulder Flexion 151 Degrees  Right Shoulder ABduction 148 Degrees    Right Shoulder Internal Rotation 68 Degrees    Right Shoulder External Rotation 80 Degrees    Left Shoulder Extension 57 Degrees    Left Shoulder Flexion 152 Degrees    Left Shoulder ABduction 163 Degrees    Left Shoulder Internal Rotation 65 Degrees    Left Shoulder External Rotation 66 Degrees      Strength   Overall Strength Within functional limits for tasks performed             LYMPHEDEMA/ONCOLOGY QUESTIONNAIRE - 09/01/19 0001      Type   Cancer Type Right breast cancer      Lymphedema Assessments   Lymphedema Assessments Upper extremities      Right Upper Extremity Lymphedema   10 cm Proximal to Olecranon Process 31.6 cm    Olecranon Process 25.7 cm    10 cm Proximal to Ulnar Styloid Process 22.8 cm    Just Proximal to Ulnar Styloid Process 15.5 cm    Across Hand at [employer] 19.8 cm    At [place] of 2nd Digit 6.6 cm      Left Upper Extremity  Lymphedema   10 cm Proximal to Olecranon Process 32.2 cm    Olecranon Process 25.8 cm    10 cm Proximal to Ulnar Styloid Process 22.2 cm    Just Proximal to Ulnar Styloid Process 15 cm    Across Hand at [employer] 20.6 cm    At [place] of 2nd Digit 6.6 cm           L-DEX FLOWSHEETS - 09/01/19 1700      L-DEX LYMPHEDEMA SCREENING   Measurement Type Unilateral    L-DEX MEASUREMENT EXTREMITY Upper Extremity    POSITION  Standing    DOMINANT SIDE Left    At Risk Side Right    BASELINE SCORE (UNILATERAL) 3.3           The patient was assessed using the L-Dex machine today to produce a lymphedema index baseline score. The patient will be reassessed on a regular basis (typically every 3 months) to obtain new L-Dex scores. If the score is > 6.5 points away from his/her baseline score indicating onset of subclinical lymphedema, it will be recommended to wear a compression garment for 4 weeks, 12 hours per day and then be reassessed. If the score continues to be > 6.5 points from baseline at reassessment, we will initiate lymphedema treatment. Assessing in this manner has a 95% rate of preventing clinically significant lymphedema.      Katina Dung - 09/01/19 0001    Open a tight or new jar Mild difficulty    Do heavy household chores (wash walls, wash floors) Mild difficulty    Carry a shopping bag or briefcase No difficulty    Wash your back No difficulty    Use a knife to cut food No difficulty    Recreational activities in which you take some force or impact through your arm, shoulder, or hand (golf, hammering, tennis) No difficulty    During the past week, to what extent has your arm, shoulder or hand problem interfered with your normal social activities with family, friends, neighbors, or groups? Modererately    During the past week, to what extent has your arm, shoulder or hand problem limited your work or other regular daily activities Modererately    Arm, shoulder, or hand pain.  Moderate    Tingling (pins and needles)  in your arm, shoulder, or hand Moderate    Difficulty Sleeping Mild difficulty    DASH Score 25 %            Objective measurements completed on examination: See above findings.       Patient was instructed today in a home exercise program today for post op shoulder range of motion. These included active assist shoulder flexion in sitting, scapular retraction, wall walking with shoulder abduction, and hands behind head external rotation.  She was encouraged to do these twice a day, holding 3 seconds and repeating 5 times when permitted by her physician.            PT Education - 09/01/19 1751    Education Details Lymphedema risk reduction and post op shoulder ROM HEP    Person(s) Educated Patient;Other (comment)   sister   Methods Explanation;Demonstration;Handout    Comprehension Returned demonstration;Verbalized understanding               PT Long Term Goals - 09/01/19 1756      PT LONG TERM GOAL #1   Title Patient will demonstrate she has regained full shoulder ROM and function post operatively compared to baselines.    Time 8    Period Weeks    Status New    Target Date 10/27/19           Breast Clinic Goals - 09/01/19 1755      Patient will be able to verbalize understanding of pertinent lymphedema risk reduction practices relevant to her diagnosis specifically related to skin care.   Time 1    Period Days    Status Achieved      Patient will be able to return demonstrate and/or verbalize understanding of the post-op home exercise program related to regaining shoulder range of motion.   Time 1    Period Days    Status Achieved      Patient will be able to verbalize understanding of the importance of attending the postoperative After Breast Cancer Class for further lymphedema risk reduction education and therapeutic exercise.   Time 1    Period Days    Status Achieved                 Plan -  09/01/19 1752    Clinical Impression Statement Patient was diagnosed on 07/23/2019 with right intermediate grade DCIS with concerns for possible invasive disease. It measures 1.9 cm and is located in the upper inner quadrant. It is ER/PR positive. Her multidisciplinary medical team met prior to her assessments to determine a recommended treatment plan. She is planning to have a right lumpectomy and sentinel node biopsy followed by radiation and anti-estrogen therapy. She will benefit from a post op PT reassessment to determine needs and from L-Dex screens every 3 months for 2 years to detect subclinical lymphedema.    Stability/Clinical Decision Making Stable/Uncomplicated    Clinical Decision Making Low    Rehab Potential Excellent    PT Frequency --   Eval and 1 f/u visit   PT Treatment/Interventions ADLs/Self Care Home Management;Therapeutic exercise;Patient/family education    PT Next Visit Plan Will reassess 3-4 weeks post op to determine needs    PT Home Exercise Plan Post op shoulder ROM HEP    Consulted and Agree with Plan of Care Patient;Family member/caregiver    Family Member Consulted sister           Patient will benefit from skilled therapeutic intervention in order  to improve the following deficits and impairments:  Postural dysfunction, Decreased range of motion, Pain, Impaired UE functional use, Decreased knowledge of precautions  Visit Diagnosis: Ductal carcinoma in situ (DCIS) of right breast with microinvasive component - Plan: PT plan of care cert/re-cert  Abnormal posture - Plan: PT plan of care cert/re-cert   Patient will follow up at outpatient cancer rehab 3-4 weeks following surgery.  If the patient requires physical therapy at that time, a specific plan will be dictated and sent to the referring physician for approval. The patient was educated today on appropriate basic range of motion exercises to begin post operatively and the importance of attending the After  Breast Cancer class following surgery.  Patient was educated today on lymphedema risk reduction practices as it pertains to recommendations that will benefit the patient immediately following surgery.  She verbalized good understanding.      Problem List Patient Active Problem List   Diagnosis Date Noted  . Ductal carcinoma in situ (DCIS) of right breast 08/31/2019  . Chronic insomnia 06/08/2019  . Atypical chest pain 07/20/2018  . Chest discomfort 05/21/2018  . Anxiety 05/21/2018  . Snoring 10/27/2017  . Episodic circadian rhythm sleep disorder, shift work type 10/27/2017  . Insomnia 10/27/2017  . Type 2 diabetes mellitus without complication, without long-term current use of insulin ([place]) 10/27/2017   Annia Friendly, PT 09/01/19 5:58 PM  [place], [place], [address] Phone: [phone]   Fax:  [phone]  Name: WANDALEE KLANG MRN: [id number] Date of Birth: February 02, 1965

## 2019-09-01 NOTE — H&P (View-Only) (Signed)
Vanessa Allen Appointment: 09/01/2019 1:00 PM Location: [facility] Patient #: [id number] DOB: 15-Feb-1965 Single / Language: Vanessa Allen / Race: White Female  History of Present Illness Vanessa Moores A. Josephmichael Lisenbee MD; 09/01/2019 3:26 PM) Patient words: Pt present to [facility] for evaluation of right breast DCIS. No hx of mass discharge or change to either breast. 1.9 cm calcifications right breast noted medial upper quadrant. Core bx DCIS with possible invasive component           CLINICAL DATA: The patient was called back for right breast calcifications.  EXAM: DIGITAL DIAGNOSTIC RIGHT MAMMOGRAM  COMPARISON: Previous exam(s).  ACR Breast Density Category c: The breast tissue is heterogeneously dense, which may obscure small masses.  FINDINGS: The calcifications in the medial right breast persist on additional imaging spanning up to 1.9 cm.  IMPRESSION: Indeterminate right breast calcifications located medially.  RECOMMENDATION: Stereotactic biopsy of the right breast calcifications.  I have discussed the findings and recommendations with the patient. If applicable, a reminder letter will be sent to the patient regarding the next appointment.  BI-RADS CATEGORY 4: Suspicious.   Electronically Signed By: Vanessa Allen M.D On: 08/19/2019 14:30      ADDITIONAL INFORMATION: PROGNOSTIC INDICATORS Results: IMMUNOHISTOCHEMICAL AND MORPHOMETRIC ANALYSIS PERFORMED MANUALLY Estrogen Receptor: 90%, POSITIVE, STRONG STAINING INTENSITY Progesterone Receptor: 90%, POSITIVE, STRONG STAINING INTENSITY REFERENCE RANGE ESTROGEN RECEPTOR NEGATIVE 0% POSITIVE =>1% REFERENCE RANGE PROGESTERONE RECEPTOR NEGATIVE 0% POSITIVE =>1% All controls stained appropriately Vanessa Males MD Pathologist, Electronic Signature ( Signed 08/30/2019) FINAL DIAGNOSIS Diagnosis Breast, right, needle core biopsy, UIQ, coil - DUCTAL CARCINOMA INVOLVING A PAPILLARY LESION, SEE  COMMENT. 1 of 2 FINAL for Vanessa Allen, Vanessa Allen [phone]) Microscopic Comment There is at least intermediate grade ductal carcinoma in situ, however, the lesion is fragmented hampering assessment and invasion can not be excluded. Lymph node sampling at the time of excision is recommended. Prognostic will be ordered. Vanessa Allen has reviewed the case. The case was called to [facility] on 08/27/19. Vanessa Males MD Pathologist, Electronic Signature (Case signed 08/27/2019).  The patient is a 54 year old female.   Past Surgical History Vanessa Slipper, RN; 09/01/2019 8:17 AM) Appendectomy Breast Biopsy Right. Foot Surgery Left, Right. Gallbladder Surgery - Laparoscopic Oral Surgery  Diagnostic Studies History Vanessa Slipper, RN; 09/01/2019 8:17 AM) Mammogram within last year 1-3 years ago Pap Smear 1-5 years ago  Medication History Vanessa Slipper, RN; 09/01/2019 8:16 AM) Medications Reconciled  Social History Vanessa Slipper, RN; 09/01/2019 8:17 AM) Caffeine use Carbonated beverages, Coffee, Tea. No alcohol use Tobacco use Never smoker.  Family History Vanessa Slipper, RN; 09/01/2019 8:17 AM) Arthritis Mother, Sister. Diabetes Mellitus Father. Heart Disease Father, Mother. Hypertension Brother, Mother, Sister. Kidney Disease Father. Seizure disorder Brother.  Pregnancy / Birth History Vanessa Slipper, RN; 09/01/2019 8:17 AM) Age at menarche 56 years, 24 years. Age of menopause <47 Contraceptive History Oral contraceptives. Gravida 0 Para 0 Regular periods  Other Problems Vanessa Slipper, RN; 09/01/2019 8:17 AM) Back Pain Cholelithiasis Diabetes Mellitus Gastroesophageal Reflux Disease     Review of Systems (Vanessa Panas A. Lori Liew MD; 09/01/2019 3:26 PM) General Not Present- Appetite Loss, Chills, Fatigue, Fever, Night Sweats, Weight Gain and Weight Loss. Skin Not Present- Change in Wart/Mole, Dryness, Hives, Jaundice, New Lesions, Non-Healing Wounds,  Rash and Ulcer. HEENT Present- Wears glasses/contact lenses. Not Present- Earache, Hearing Loss, Hoarseness, Nose Bleed, Oral Ulcers, Ringing in the Ears, Seasonal Allergies, Sinus Pain, Sore Throat, Visual Disturbances and Yellow Eyes. Respiratory Present- Snoring.  Not Present- Bloody sputum, Chronic Cough, Difficulty Breathing and Wheezing. Breast Not Present- Breast Mass, Breast Pain, Nipple Discharge and Skin Changes. Cardiovascular Not Present- Chest Pain, Difficulty Breathing Lying Down, Leg Cramps, Palpitations, Rapid Heart Rate, Shortness of Breath and Swelling of Extremities. Gastrointestinal Not Present- Abdominal Pain, Bloating, Bloody Stool, Change in Bowel Habits, Chronic diarrhea, Constipation, Difficulty Swallowing, Excessive gas, Gets full quickly at meals, Hemorrhoids, Indigestion, Nausea, Rectal Pain and Vomiting. Female Genitourinary Not Present- Frequency, Nocturia, Painful Urination, Pelvic Pain and Urgency. Musculoskeletal Present- Back Pain. Not Present- Joint Pain, Joint Stiffness, Muscle Pain, Muscle Weakness and Swelling of Extremities. Neurological Not Present- Decreased Memory, Fainting, Headaches, Numbness, Seizures, Tingling, Tremor, Trouble walking and Weakness. Psychiatric Present- Change in Sleep Pattern. Not Present- Anxiety, Bipolar, Depression, Fearful and Frequent crying. Endocrine Not Present- Cold Intolerance, Excessive Hunger, Hair Changes, Heat Intolerance, Hot flashes and New Diabetes. Hematology Present- Blood Thinners. Not Present- Easy Bruising, Excessive bleeding, Gland problems, HIV and Persistent Infections. All other systems negative   Physical Exam (Lauralye Kinn A. Amori Cooperman MD; 09/01/2019 3:26 PM)  General Mental Status-Alert. General Appearance-Consistent with stated age. Hydration-Well hydrated. Voice-Normal.  Head and Neck Head-normocephalic, atraumatic with no lesions or palpable masses. Trachea-midline. Thyroid Gland Characteristics  - normal size and consistency.  Eye Eyeball - Bilateral-Extraocular movements intact. Sclera/Conjunctiva - Bilateral-No scleral icterus.  Chest and Lung Exam Chest and lung exam reveals -quiet, even and easy respiratory effort with no use of accessory muscles and on auscultation, normal breath sounds, no adventitious sounds and normal vocal resonance. Inspection Chest Wall - Normal. Back - normal.  Breast Breast - Left-Symmetric, Non Tender, No Biopsy scars, no Dimpling - Left, No Inflammation, No Lumpectomy scars, No Mastectomy scars, No Peau d' Orange. Breast - Right-Symmetric, Non Tender, No Biopsy scars, no Dimpling - Right, No Inflammation, No Lumpectomy scars, No Mastectomy scars, No Peau d' Orange. Breast Lump-No Palpable Breast Mass.  Cardiovascular Cardiovascular examination reveals -normal heart sounds, regular rate and rhythm with no murmurs and normal pedal pulses bilaterally.  Abdomen Inspection Inspection of the abdomen reveals - No Hernias. Skin - Scar - no surgical scars. Palpation/Percussion Palpation and Percussion of the abdomen reveal - Soft, Non Tender, No Rebound tenderness, No Rigidity (guarding) and No hepatosplenomegaly. Auscultation Auscultation of the abdomen reveals - Bowel sounds normal.  Neurologic Neurologic evaluation reveals -alert and oriented x 3 with no impairment of recent or remote memory. Mental Status-Normal.  Musculoskeletal Normal Exam - Left-Upper Extremity Strength Normal and Lower Extremity Strength Normal. Normal Exam - Right-Upper Extremity Strength Normal and Lower Extremity Strength Normal.  Lymphatic Head & Neck  General Head & Neck Lymphatics: Bilateral - Description - Normal. Axillary  General Axillary Region: Bilateral - Description - Normal. Tenderness - Non Tender. Femoral & Inguinal  Generalized Femoral & Inguinal Lymphatics: Bilateral - Description - Normal. Tenderness - Non  Tender.    Assessment & Plan (Vanessa Constantine A. 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 Pt agrees to proceed. Risk of sentinel lymph node mapping include bleeding, infection, lymphedema, shoulder pain. stiffness, dye allergy. lymphedema cosmetic deformity , blood clots, death, need for more surgery. Pt agrees to proceed.  Current Plans You are being scheduled for surgery-  Our schedulers will call you.  You should hear from our office's scheduling department within 5 working days about the location, date, and time of surgery. We try to make accommodations for patient's preferences in scheduling surgery, but sometimes the OR schedule or the surgeon's schedule prevents [place] from making those accommodations.  If you have not heard from our office [phone]) in 5 working days, call the office and ask for your surgeon's nurse.  If you have other questions about your diagnosis, plan, or surgery, call the office and ask for your surgeon's nurse.  Pt Education - [facility] Breast Cancer Information Given - [facility] "Breast Journey" Package We discussed the staging and pathophysiology of breast cancer. We discussed all of the different options for treatment for breast cancer including surgery, chemotherapy, radiation therapy, Herceptin, and antiestrogen therapy. We discussed a sentinel lymph node biopsy as she does not appear to having lymph node involvement right now. We discussed the performance of that with injection of radioactive tracer and blue dye. We discussed that she would have an incision underneath her axillary hairline. We discussed that there is a bout a  10-20% chance of having a positive node with a sentinel lymph node biopsy and we will await the permanent pathology to make any other first further decisions in terms of her treatment. One of these options might be to return to the operating room to perform an axillary lymph node dissection. We discussed about a 1-2% risk lifetime of chronic shoulder pain as well as lymphedema associated with a sentinel lymph node biopsy. We discussed the options for treatment of the breast cancer which included lumpectomy versus a mastectomy. We discussed the performance of the lumpectomy with a wire placement. We discussed a 10-20% chance of a positive margin requiring reexcision in the operating room. We also discussed that she may need radiation therapy or antiestrogen therapy or both if she undergoes lumpectomy. We discussed the mastectomy and the postoperative care for that as well. We discussed that there is no difference in her survival whether she undergoes lumpectomy with radiation therapy or antiestrogen therapy versus a mastectomy. There is a slight difference in the local recurrence rate being 3-5% with lumpectomy and about 1% with a mastectomy. We discussed the risks of operation including bleeding, infection, possible reoperation. She understands her further therapy will be based on what her stages at the time of her operation.  Pt Education - [facility] breast cancer surgery: discussed with patient and provided information.

## 2019-09-01 NOTE — Patient Instructions (Signed)

## 2019-09-01 NOTE — Progress Notes (Signed)
Radiation Oncology         [phone] ________________________________  Name: Vanessa Allen        MRN: [id number]  Date of Service: 09/01/2019 DOB: Aug 25, 1965  CZ:YSAYT, Doreene Burke, FNP  Erroll Luna, MD     REFERRING PHYSICIAN: Erroll Luna, MD   DIAGNOSIS: The encounter diagnosis was Ductal carcinoma in situ (DCIS) of right breast.   HISTORY OF PRESENT ILLNESS: Vanessa Allen is a 54 y.o. female seen in the multidisciplinary breast clinic for a new diagnosis of right breast cancer. The patient was noted to have screening detected calcifications in the right breast and diagnostic imaging measured these spanning 1.9 cm. A biopsy on 08/25/19 revealed an intermediate grade, ER/PR positive DCIS, there was a comment as well as a possible concern for microinvasive disease. She is seen today to discuss treatment recommendations of her cancer.    PREVIOUS RADIATION THERAPY: No   PAST MEDICAL HISTORY:  Past Medical History:  Diagnosis Date  . Diabetes mellitus without complication ([place])        PAST SURGICAL HISTORY: Past Surgical History:  Procedure Laterality Date  . APPENDECTOMY    . CHOLECYSTECTOMY    . MYOMECTOMY       FAMILY HISTORY:  Family History  Problem Relation Age of Onset  . Hyperlipidemia Mother   . Hypertension Mother   . Cancer Father   . Diabetes Father   . Heart disease Father   . Hypertension Brother   . Hypertension Daughter   . Breast cancer Neg Hx      SOCIAL HISTORY:  reports that she has never smoked. She has never used smokeless tobacco. She reports current alcohol use. She reports that she does not use drugs. The patient is single and lives in [place]. 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   ALLERGIES: Patient has no known allergies.   MEDICATIONS:  Current Outpatient Medications  Medication Sig Dispense Refill  . ACCU-CHEK GUIDE test strip USE TWICE DAILY TO CHECK BLOOD SUGAR BEFORE BREAKFAST AND DINNER  100 strip 5  . ALPRAZolam (XANAX) 0.25 MG tablet Take 1 tablet (0.25 mg total) by mouth 3 (three) times daily as needed for anxiety. 30 tablet 0  . amitriptyline (ELAVIL) 25 MG tablet Take 1 tablet (25 mg total) by mouth at bedtime. 30 tablet 3  . atorvastatin (LIPITOR) 10 MG tablet TAKE 1 TABLET(10 MG) BY MOUTH DAILY 90 tablet 1  . Continuous Blood Gluc Receiver (FREESTYLE LIBRE 14 DAY READER) DEVI USE TO CHECK BLOOD SUGAR BEFORE MEALS AND AT BEDTIME 1 each 2  . Continuous Blood Gluc Sensor (FREESTYLE LIBRE 14 DAY SENSOR) MISC USE TO MEASURE BLOOD GLUCOSE FOUR TIMES DAILY BEFORE MEALS AND AT BEDTIME 2 each 11  . esomeprazole (NEXIUM) 20 MG capsule GENERIC FOR NEXIUM. TAKE 1 CAPSULE BY MOUTH DAILY AT LEAST 1 HOUR BEFORE A MEAL. SWALLOWING WHOLE. DO NOT CRUSH OR CHEW GRANULES 90 capsule 0  . estradiol (VIVELLE-DOT) 0.025 MG/24HR APPLY TO SKIN TWICE WEEKLY AS DIRECTED 8 patch 1  . fluticasone (FLONASE) 50 MCG/ACT nasal spray as needed.   0  . Glucose Blood (GLUCOSE METER TEST VI) by In Vitro route 2 (two) times daily.    . metFORMIN (GLUCOPHAGE) 500 MG tablet TAKE 1 TABLET BY MOUTH EVERY DAY WITH BREAKFAST 90 tablet 1  . progesterone (PROMETRIUM) 100 MG capsule TAKE 2 CAPSULES BY MOUTH EVERY NIGHT AT BEDTIME FOR 2 WEEKS, THEN 3 CAPSULES EVERY NIGHT AT  BEDTIME IF NEEDED. TAKE ON DAY 1 TO 27 OF EACH MONTH 81 capsule 1  . Semaglutide (RYBELSUS) 7 MG TABS Take 1 tablet by mouth daily. 30 tablet 3  . Suvorexant (BELSOMRA) 10 MG TABS Take 1 tablet by mouth at bedtime as needed. 30 tablet 0   No current facility-administered medications for this encounter.     REVIEW OF SYSTEMS: On review of systems, the patient reports that she is doing well overall. No specific complaints are noted.   PHYSICAL EXAM:  Wt Readings from Last 3 Encounters:  08/19/19 178 lb (80.7 kg)  07/26/19 176 lb 9.6 oz (80.1 kg)  04/12/19 178 lb (80.7 kg)   Temp Readings from Last 3 Encounters:  08/19/19 98.1 F (36.7 C) (Oral)   07/26/19 98.1 F (36.7 C) (Oral)  04/12/19 97.8 F (36.6 C) (Oral)   BP Readings from Last 3 Encounters:  08/19/19 (!) 140/90  07/26/19 118/66  04/12/19 118/76   Pulse Readings from Last 3 Encounters:  08/19/19 87  07/26/19 77  04/12/19 74    In general this is a well appearing African American female in no acute distress. She's alert and oriented x4 and appropriate throughout the examination. Cardiopulmonary assessment is negative for acute distress and she exhibits normal effort. Bilateral breast exam is deferred.    ECOG = 0  0 - Asymptomatic (Fully active, able to carry on all predisease activities without restriction)  1 - Symptomatic but completely ambulatory (Restricted in physically strenuous activity but ambulatory and able to carry out work of a light or sedentary nature. For example, light housework, office work)  2 - Symptomatic, <50% in bed during the day (Ambulatory and capable of all self care but unable to carry out any work activities. Up and about more than 50% of waking hours)  3 - Symptomatic, >50% in bed, but not bedbound (Capable of only limited self-care, confined to bed or chair 50% or more of waking hours)  4 - Bedbound (Completely disabled. Cannot carry on any self-care. Totally confined to bed or chair)  5 - Death   Eustace Pen MM, Creech RH, Tormey DC, et al. [phone]). "Toxicity and response criteria of the [facility]". [place] Oncol. 5 (6): 649-55    LABORATORY DATA:  Lab Results  Component Value Date   WBC 5.2 07/26/2019   HGB 14.4 07/26/2019   HCT 42.9 07/26/2019   MCV 81 07/26/2019   PLT 255 07/26/2019   Lab Results  Component Value Date   NA 145 (H) 07/26/2019   K 4.2 07/26/2019   CL 105 07/26/2019   CO2 21 07/26/2019   Lab Results  Component Value Date   ALT 32 07/26/2019   AST 21 07/26/2019   ALKPHOS 84 07/26/2019   BILITOT 0.5 07/26/2019      RADIOGRAPHY: MM Digital Diagnostic Unilat R  Result  Date: 08/19/2019 CLINICAL DATA:  The patient was called back for right breast calcifications. EXAM: DIGITAL DIAGNOSTIC RIGHT MAMMOGRAM COMPARISON:  Previous exam(s). ACR Breast Density Category c: The breast tissue is heterogeneously dense, which may obscure small masses. FINDINGS: The calcifications in the medial right breast persist on additional imaging spanning up to 1.9 cm. IMPRESSION: Indeterminate right breast calcifications located medially. RECOMMENDATION: Stereotactic biopsy of the right breast calcifications. I have discussed the findings and recommendations with the patient. If applicable, a reminder letter will be sent to the patient regarding the next appointment. BI-RADS CATEGORY  4: Suspicious. Electronically Signed   By:  Dorise Bullion III M.D   On: 08/19/2019 14:30   [place] AXILLA RIGHT  Result Date: 08/31/2019 CLINICAL DATA:  54 year old female with recently diagnosed RIGHT breast cancer. Evaluate RIGHT axillary lymph nodes. EXAM: ULTRASOUND OF THE RIGHT AXILLA COMPARISON:  Previous exam(s). FINDINGS: Ultrasound is performed, showing no abnormal appearing RIGHT axillary lymph nodes. IMPRESSION: No abnormal appearing RIGHT axillary lymph nodes. RECOMMENDATION: Treatment plan I have discussed the findings and recommendations with the patient. If applicable, a reminder letter will be sent to the patient regarding the next appointment. BI-RADS CATEGORY  1: Negative. Electronically Signed   By: Margarette Canada M.D.   On: 08/31/2019 09:52   MM CLIP PLACEMENT RIGHT  Result Date: 08/25/2019 CLINICAL DATA:  Status post stereotactic guided core biopsy of calcifications in the RIGHT breast. EXAM: DIAGNOSTIC RIGHT MAMMOGRAM POST STEREOTACTIC BIOPSY COMPARISON:  Previous exam(s). FINDINGS: Mammographic images were obtained following stereotactic guided biopsy of calcifications in the UPPER INNER QUADRANT of the RIGHT breast and placement of a coil shaped clip. The biopsy marking clip is in expected position at  the site of biopsy. IMPRESSION: Appropriate positioning of the coil shaped biopsy marking clip at the site of biopsy in the UPPER INNER QUADRANT RIGHT breast. Final Assessment: Post Procedure Mammograms for Marker Placement Electronically Signed   By: Nolon Nations M.D.   On: 08/25/2019 12:17   MM RT BREAST BX W LOC DEV 1ST LESION IMAGE BX SPEC STEREO GUIDE  Addendum Date: 08/27/2019   ADDENDUM REPORT: 08/27/2019 14:49 ADDENDUM: Pathology revealed INTERMEDIATE GRADE DUCTAL CARCINOMA INVOLVING A PAPILLARY LESION of the RIGHT breast, upper inner quadrant, (coil clip). The lesion is fragmented hampering assessment and invasion can not be excluded. Lymph node sampling at the time of excision is recommended. This was found to be concordant by Dr. Nolon Nations. Pathology results were discussed with the patient by telephone. The patient reported doing well after the biopsy with tenderness and bleeding at the site. Post biopsy instructions and care were reviewed and questions were answered. The patient was encouraged to call [facility] for any additional concerns. My direct phone number was provided. The patient was referred to [facility] at [facility] on September 01, 2019. The patient is scheduled for a RIGHT axillary ultrasound on August 31, 2019 for further evaluation of the lymph nodes due to the potential invasive component. Pathology results reported by Terie Purser, RN on 08/27/2019. Electronically Signed   By: Nolon Nations M.D.   On: 08/27/2019 14:49   Result Date: 08/27/2019 CLINICAL DATA:  And presents for stereotactic guided core biopsy of calcifications in the RIGHT breast. EXAM: RIGHT BREAST STEREOTACTIC CORE NEEDLE BIOPSY COMPARISON:  Previous exams. FINDINGS: The patient and I discussed the procedure of stereotactic-guided biopsy including benefits and alternatives. We discussed the high likelihood of a  successful procedure. We discussed the risks of the procedure including infection, bleeding, tissue injury, clip migration, and inadequate sampling. Informed written consent was given. The usual time out protocol was performed immediately prior to the procedure. Using sterile technique and 1% lidocaine and 1% lidocaine with epinephrine as local anesthetic, under stereotactic guidance, a 9 gauge vacuum assisted device was used to perform core needle biopsy of calcifications in the UPPER INNER QUADRANT of the RIGHT breast using a MEDIAL to LATERAL approach. Specimen radiograph was performed showing calcifications in numerous tissue samples. Specimens with calcifications are identified for pathology. Lesion quadrant: Upper inner quadrant At the  conclusion of the procedure, coil shaped tissue marker clip was deployed into the biopsy cavity. Follow-up 2-view mammogram was performed and dictated separately. IMPRESSION: Stereotactic-guided biopsy of RIGHT breast calcifications. No apparent complications. Electronically Signed: By: Nolon Nations M.D. On: 08/25/2019 12:09       IMPRESSION/PLAN: 1. 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 Dr. Lisbeth Renshaw discusses the pathology findings and reviews the nature of right non invasive breast disease. The consensus from the breast conference includes MRI to document extent of disease. She appears to be a good candidate for breast conservation with lumpectomy given the concern for microinvasive component. She would benefit from adjuvant external radiotherapy to the breast followed by antiestrogen therapy. We discussed the risks, benefits, short, and long term effects of radiotherapy, and the patient is interested in proceeding. Dr. Lisbeth Renshaw discusses the delivery and logistics of radiotherapy and anticipates a course of 6 1/2 weeks of radiotherapy. We will see her back about 2 weeks after surgery to discuss the simulation process and anticipate we starting  radiotherapy about 4-6 weeks after surgery.    In a visit lasting 60 minutes, greater than 50% of the time was spent face to face reviewing her case, as well as in preparation of, discussing, and coordinating the patient's care.  The above documentation reflects my direct findings during this shared patient visit. Please see the separate note by Dr. Lisbeth Renshaw on this date for the remainder of the patient's plan of care.    Carola Rhine, PAC

## 2019-09-02 ENCOUNTER — Encounter: Payer: Self-pay | Admitting: Genetic Counselor

## 2019-09-02 ENCOUNTER — Encounter: Payer: Self-pay | Admitting: General Practice

## 2019-09-02 DIAGNOSIS — Z8041 Family history of malignant neoplasm of ovary: Secondary | ICD-10-CM | POA: Insufficient documentation

## 2019-09-02 DIAGNOSIS — Z800 Family history of malignant neoplasm of digestive organs: Secondary | ICD-10-CM | POA: Insufficient documentation

## 2019-09-02 DIAGNOSIS — Z8481 Family history of carrier of genetic disease: Secondary | ICD-10-CM | POA: Insufficient documentation

## 2019-09-02 DIAGNOSIS — Z803 Family history of malignant neoplasm of breast: Secondary | ICD-10-CM | POA: Insufficient documentation

## 2019-09-02 DIAGNOSIS — Z806 Family history of leukemia: Secondary | ICD-10-CM | POA: Insufficient documentation

## 2019-09-02 NOTE — Progress Notes (Signed)
REFERRING PROVIDER: Chauncey Cruel, MD [address] [place],  [place] [address]  PRIMARY PROVIDER:  [name], FNP  PRIMARY REASON FOR VISIT:  1. Ductal carcinoma in situ (DCIS) of right breast   2. Family history of BRCA gene mutation   3. Family history of breast cancer   4. Family history of ovarian cancer   5. Family history of stomach cancer   6. Family history of leukemia      HISTORY OF PRESENT ILLNESS:   Ms. Audi, a 54 y.o. female, was seen for a [facility] cancer genetics consultation at the request of Dr. Jana Hakim due to a personal and family history of cancer and a family history of a known BRCA mutation.  Ms. Nappi presents to clinic today to discuss the possibility of a hereditary predisposition to cancer, genetic testing, and to further clarify her future cancer risks, as well as potential cancer risks for family members.   In 2021, at the age of 52, Ms. Morand was diagnosed with ductal carcinoma involving a papillary lesion, ER+/PR+, of the right breast. The treatment plan includes surgery, adjuvant radiation therapy, and antiestrogen therapy.   RISK FACTORS:  Menarche was at age 18.  No live births. OCP use for approximately 0 years.  Ovaries intact: yes.  Hysterectomy: no.  Menopausal status: postmenopausal.  HRT use: <1 year. Colonoscopy: yes; 11/2017, repeat in 10 years. Mammogram within the last year: yes. Up to date with pelvic exams: yes.   Past Medical History:  Diagnosis Date  . Diabetes mellitus without complication ([facility])   . Family history of BRCA gene mutation   . Family history of breast cancer   . Family history of leukemia   . Family history of ovarian cancer   . Family history of stomach cancer     Past Surgical History:  Procedure Laterality Date  . APPENDECTOMY    . CHOLECYSTECTOMY    . MYOMECTOMY      Social History   Socioeconomic History  . Marital status: Single    Spouse name: Not on file  . Number of  children: Not on file  . Years of education: Not on file  . Highest education level: Not on file  Occupational History  . Not on file  Tobacco Use  . Smoking status: Never Smoker  . Smokeless tobacco: Never Used  Substance and Sexual Activity  . Alcohol use: Yes    Alcohol/week: 0.0 standard drinks    Comment: occasional  . Drug use: No  . Sexual activity: Not on file  Other Topics Concern  . Not on file  Social History Narrative  . Not on file   Social Determinants of Health   Financial Resource Strain:   . Difficulty of Paying Living Expenses:   Food Insecurity:   . Worried About Charity fundraiser in the Last Year:   . Arboriculturist in the Last Year:   Transportation Needs:   . Film/video editor (Medical):   Marland Kitchen Lack of Transportation (Non-Medical):   Physical Activity:   . Days of Exercise per Week:   . Minutes of Exercise per Session:   Stress:   . Feeling of Stress :   Social Connections:   . Frequency of Communication with Friends and Family:   . Frequency of Social Gatherings with Friends and Family:   . Attends Religious Services:   . Active Member of Clubs or Organizations:   . Attends Archivist Meetings:   .  Marital Status:      FAMILY HISTORY:  We obtained a detailed, 4-generation family history.  Significant diagnoses are listed below: Family History  Problem Relation Age of Onset  . Hyperlipidemia Mother   . Hypertension Mother   . Diabetes Father   . Heart disease Father   . Stomach cancer Father 53  . Hypertension Brother   . Hypertension Daughter   . Leukemia Paternal Aunt        dx. >50  . Breast cancer Niece 41  . BRCA 1/2 Niece   . Ovarian cancer Cousin        d. <50 (maternal first cousin)  . Ovarian cancer Cousin        d. <50 (maternal first cousin)  . Ovarian cancer Cousin        d. late 50s (maternal first cousin)   Ms. Pascarella does not have children. She is the youngest of three brothers and two sisters. The  oldest of her siblings is 57, and none of her siblings have had cancer. She has a niece (her brother's daughter) who had breast cancer diagnosed at the age of 60 and is BRCA positive. Her niece's mother reportedly had negative genetic testing.   Ms. Guyton mother is 23 and has not had cancer. Ms. Reyburn had five maternal aunts and two maternal uncles, none of whom have had cancer. She has had three maternal first cousins with ovarian cancer - two were diagnosed younger than 60, and the third was in her late 5s. Ms. Cannady maternal grandmother died at the age of 54 and did not have cancer, and she does not have information about her maternal grandfather.   Ms. Pounds father died at the age of 71 from stomach cancer. She had one paternal aunt and one paternal uncle. Her aunt died from leukemia when she was older than 34. There is no known cancer among paternal cousins. Her paternal grandmother died in her 89s or 27s, and she does not have information about her paternal grandfather.  Ms. Zogg is aware of previous family history of genetic testing for hereditary cancer risks in her niece, who is BRCA positive. Patient's maternal ancestors are of [place] and [place] descent, and paternal ancestors are of [place] and [place] descent. There is no reported Ashkenazi Jewish ancestry. There is no known consanguinity.  GENETIC COUNSELING ASSESSMENT: Ms. Matsen is a 54 y.o. female with a personal history of breast cancer and a family history of breast cancer, ovarian cancer, and a known BRCA mutation, which is suggestive of a possible hereditary cancer syndrome in Ms. Weimar. We, therefore, discussed and recommended the following at today's visit.   DISCUSSION:  Ms. Gillies niece had genetic testing that was positive for a mutation in a BRCA gene. These results were not available for review at today's appointment. We discussed that, assuming her niece's mother did have negative genetic testing as reported,  Ms. Sterbenz has a 50% (1 in 2) chance to also have the BRCA mutation. Mutations in the BRCA genes are associated with an increased risk for mainly breast and ovarian cancer in women, as well as female breast cancer, prostate cancer, and pancreatic cancer.    There are other genes that are known to increase the risk for breast and/or cancer. These genes include ATM, CHEK2, PALB2, BRIP1, RAD51C/D etc. We discussed that testing is beneficial for several reasons including knowing about other cancer risks, identifying potential screening and risk-reduction options that may be appropriate, and to understand  if other family members could be at risk for cancer and allow them to undergo genetic testing.  We reviewed the characteristics, features and inheritance patterns of hereditary cancer syndromes. We also discussed genetic testing, including the appropriate family members to test, the process of testing, insurance coverage and turn-around-time for results. We discussed the implications of a negative, positive and/or variant of uncertain significant result. In order to get genetic test results in a timely manner so that Ms. Odor can use these genetic test results for surgical decisions, we recommended Ms. Philbrook pursue genetic testing for the [facility] Breast Cancer STAT panel. Once complete, we recommend Ms. Faw pursue reflex genetic testing to the Common Hereditary Cancers panel.   The Breast Cancer STAT Panel offered by [facility] includes sequencing and deletion/duplication analysis for the following 9 genes:  ATM, BRCA1, BRCA2, CDH1, CHEK2, PALB2, PTEN, STK11 and TP53. The Common Hereditary Cancers Panel offered by [facility] includes sequencing and/or deletion duplication testing of the following 48 genes: APC, ATM, AXIN2, BARD1, BMPR1A, BRCA1, BRCA2, BRIP1, CDH1, CDK4, CDKN2A (p14ARF), CDKN2A (p16INK4a), CHEK2, CTNNA1, DICER1, EPCAM (Deletion/duplication testing only), GREM1 (promoter region deletion/duplication  testing only), KIT, MEN1, MLH1, MSH2, MSH3, MSH6, MUTYH, NBN, NF1, NTHL1, PALB2, PDGFRA, PMS2, POLD1, POLE, PTEN, RAD50, RAD51C, RAD51D, RNF43, SDHB, SDHC, SDHD, SMAD4, SMARCA4. STK11, TP53, TSC1, TSC2, and VHL.  The following genes were evaluated for sequence changes only: SDHA and HOXB13 c.251G>A variant only.  Based on Ms. Schoenbeck's personal and family history of cancer and a known BRCA mutation, she meets medical criteria for genetic testing. Despite that she meets criteria, she may still have an out of pocket cost.   PLAN: After considering the risks, benefits, and limitations, Ms. Kroeze provided informed consent to pursue genetic testing and the blood sample was sent to [facility] for analysis of the Breast Cancer STAT panel + Common Hereditary Cancers Panel. Results should be available within approximately one-two weeks' time, at which point they will be disclosed by telephone to Ms. Auriemma, as will any additional recommendations warranted by these results. Ms. Wooden will receive a summary of her genetic counseling visit and a copy of her results once available. This information will also be available in Epic.   Ms. Kouns questions were answered to her satisfaction today. Our contact information was provided should additional questions or concerns arise. Thank you for the referral and allowing [place] to share in the care of your patient.   Clint Guy, [place], [facility] Licensed, Certified Dispensing [email or web address] Phone: [phone]  The patient was seen for a total of 25 minutes in face-to-face genetic counseling.  This patient was discussed with Drs. Magrinat, Lindi Adie and/or Burr Medico who agrees with the above.    _______________________________________________________________________ For Office Staff:  Number of people involved in session: 1 Was an Intern/ student involved with case: no

## 2019-09-02 NOTE — Progress Notes (Signed)
[facility] Psychosocial Distress Screening Spiritual Care  Met with [place] by phone following Breast Multidisciplinary Clinic to introduce [facility] team/resources, reviewing distress screen per protocol.  The patient scored a 0 on the Psychosocial Distress Thermometer which indicates minimal distress. Also assessed for distress and other psychosocial needs.   ONCBCN DISTRESS SCREENING 09/02/2019  Screening Type Initial Screening  Distress experienced in past week (1-10) 0  Referral to support programs Yes   Ms Lindenbaum reports minimal distress. She is aware of ongoing [facility] team/programming availability, but please also page if needs arise or circumstances change. Thank you.  Follow up needed: No.   Chaplain Lorrin Jackson, [place], [facility] Pager [phone] Voicemail [phone]

## 2019-09-03 ENCOUNTER — Other Ambulatory Visit: Payer: Self-pay | Admitting: *Deleted

## 2019-09-03 ENCOUNTER — Telehealth: Payer: Self-pay | Admitting: Oncology

## 2019-09-03 DIAGNOSIS — I739 Peripheral vascular disease, unspecified: Secondary | ICD-10-CM

## 2019-09-03 NOTE — Telephone Encounter (Signed)
Scheduled appts per 8/4 los. Pt confirmed appt date and time.

## 2019-09-07 ENCOUNTER — Encounter: Payer: Self-pay | Admitting: Nutrition

## 2019-09-07 ENCOUNTER — Other Ambulatory Visit: Payer: Self-pay | Admitting: Nurse Practitioner

## 2019-09-07 DIAGNOSIS — E119 Type 2 diabetes mellitus without complications: Secondary | ICD-10-CM

## 2019-09-07 NOTE — Progress Notes (Signed)
Chart reviewed. Patient has not had weight loss or nutrition impact symptoms. She was given a packet of nutrition information during breast clinic with RD name and phone number. No further nutrition interventions are warranted.

## 2019-09-08 ENCOUNTER — Ambulatory Visit (HOSPITAL_COMMUNITY)
Admission: RE | Admit: 2019-09-08 | Discharge: 2019-09-08 | Disposition: A | Discharge status: Home / Self Care | Payer: BC Managed Care – PPO | Source: Ambulatory Visit | Attending: Surgery | Admitting: Surgery

## 2019-09-08 ENCOUNTER — Ambulatory Visit (INDEPENDENT_AMBULATORY_CARE_PROVIDER_SITE_OTHER): Payer: BC Managed Care – PPO | Admitting: Neurology

## 2019-09-08 ENCOUNTER — Other Ambulatory Visit: Payer: Self-pay

## 2019-09-08 DIAGNOSIS — G4726 Circadian rhythm sleep disorder, shift work type: Secondary | ICD-10-CM

## 2019-09-08 DIAGNOSIS — D0511 Intraductal carcinoma in situ of right breast: Secondary | ICD-10-CM | POA: Insufficient documentation

## 2019-09-08 DIAGNOSIS — G4731 Primary central sleep apnea: Secondary | ICD-10-CM

## 2019-09-08 DIAGNOSIS — G4700 Insomnia, unspecified: Secondary | ICD-10-CM

## 2019-09-08 DIAGNOSIS — F5104 Psychophysiologic insomnia: Secondary | ICD-10-CM

## 2019-09-08 IMAGING — MR MR BREAST BILAT WO/W CM
7 of 11 series · 29 of 48 positions shown · IV contrast (gadavist)
Comparison: Previous exam(s).

CLINICAL DATA: Recently diagnosed right breast DCIS. Evaluate
extent of disease.

EXAM:
BILATERAL BREAST MRI WITH AND WITHOUT CONTRAST
LABS:  No labs drawn at time of imaging.
TECHNIQUE: Multiplanar, multisequence MR images of both breasts were obtained
prior to and following the intravenous administration of 10 ml of
Gadavist

[Series 2: T2 · axial · 3.0mm · 0.89mm/px · z∈[-79,+104]mm · 4 of 62 slices shown]
[im 1/62]
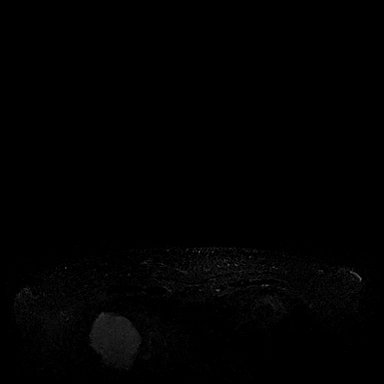
[im 21/62]
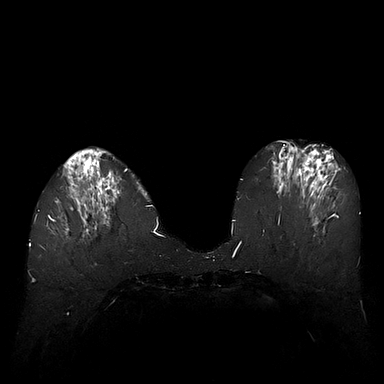
[im 41/62]
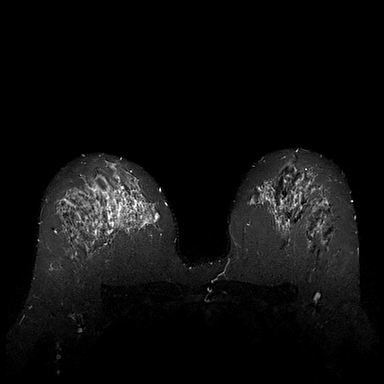
[im 62/62]
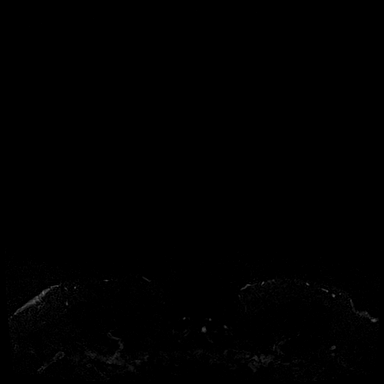

[Series 3: T1 fat-sat · axial · 1.2mm · 0.78mm/px · z∈[-83,+108]mm · 7 of 160 slices shown (1 of 4)]
[im 1/160]
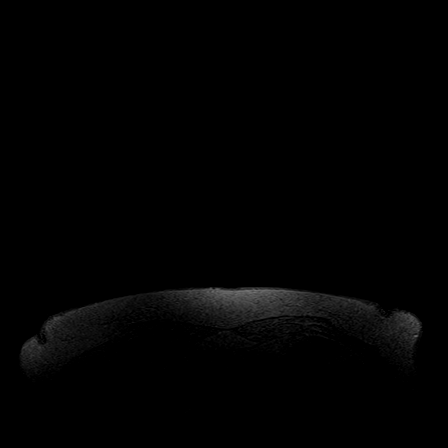
[im 27/160]
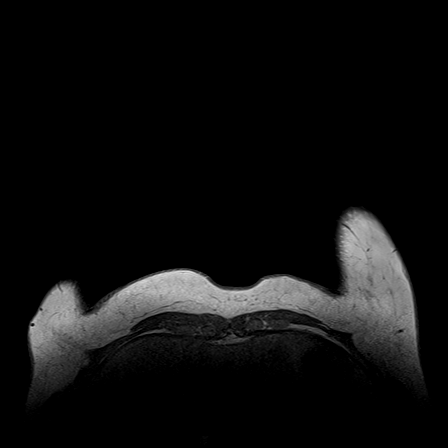
[im 54/160]
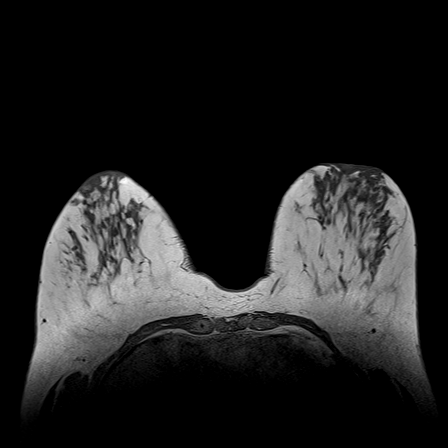
[im 80/160]
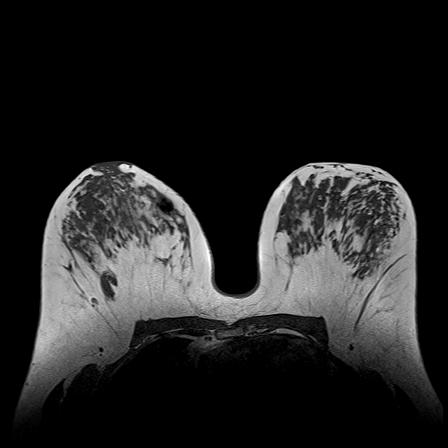
[im 107/160]
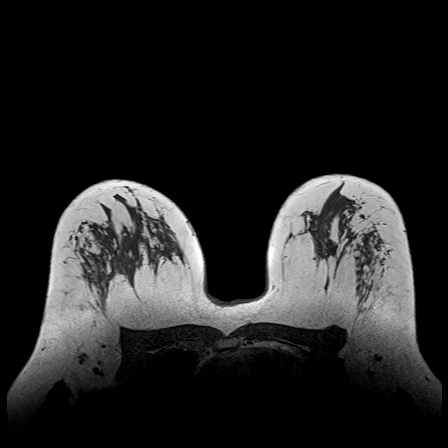
[im 133/160]
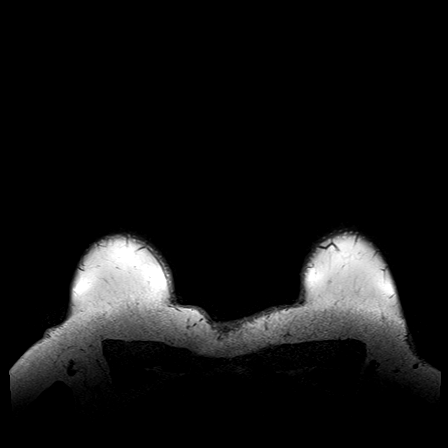
[im 160/160]
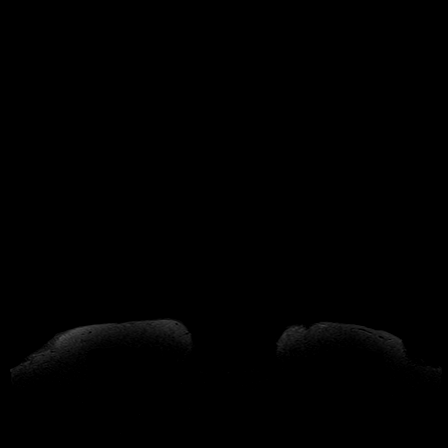

[Series 5: T1 fat-sat · axial · 1.6mm · 0.84mm/px · z∈[-86,+117]mm · 5 of 128 slices shown (2 of 4)]
[im 1/128]
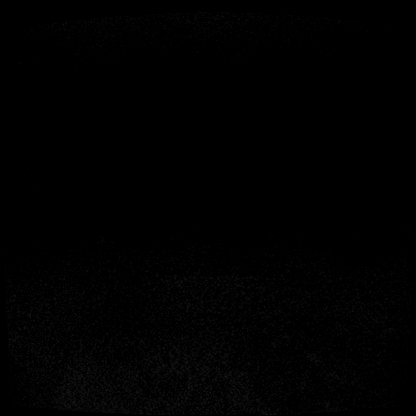
[im 32/128]
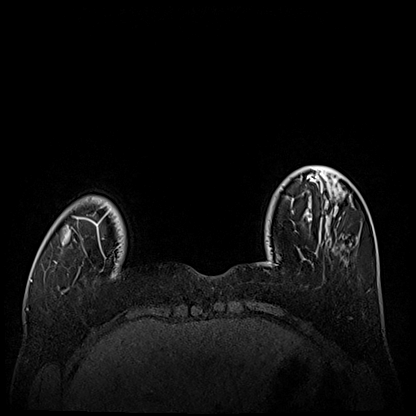
[im 64/128]
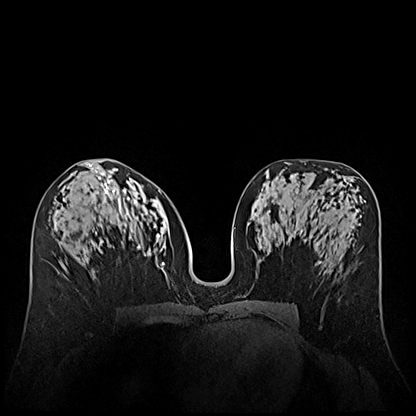
[im 96/128]
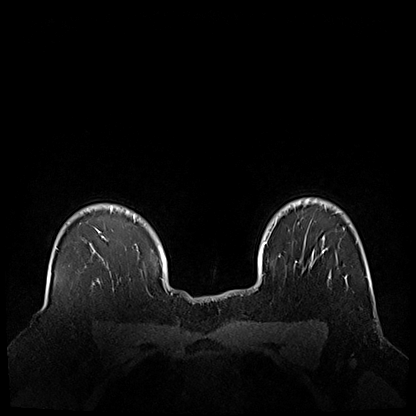
[im 128/128]
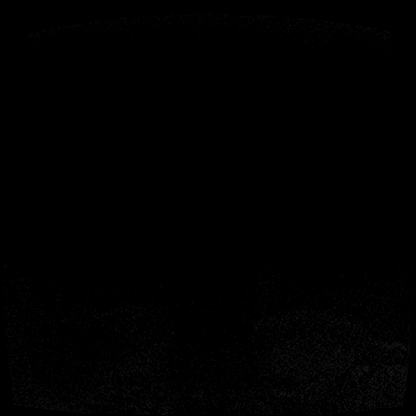

[Series 6: T1 fat-sat · axial · 1.6mm · 0.84mm/px · z∈[-86,+117]mm · 5 of 128 slices shown (3 of 4)]
[im 1/128]
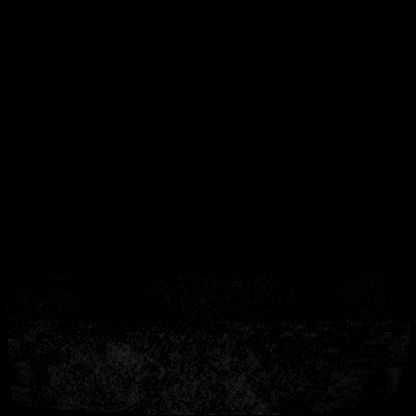
[im 32/128]
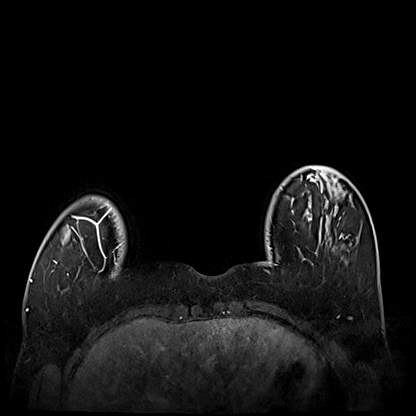
[im 64/128]
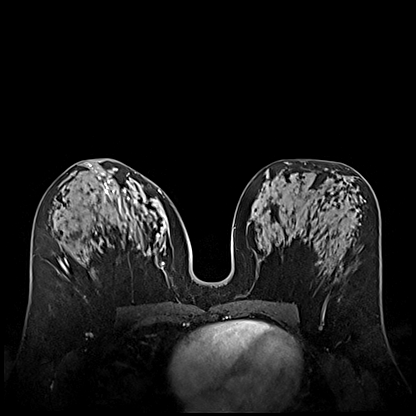
[im 96/128]
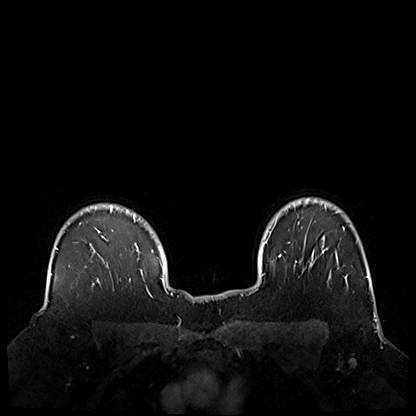
[im 128/128]
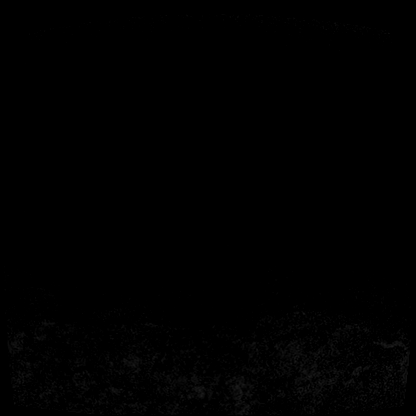

[Series 7: T1 · axial · 1.6mm · 0.84mm/px · z∈[-86,+117]mm · 5 of 128 slices shown (1 of 2)]
[im 1/128]
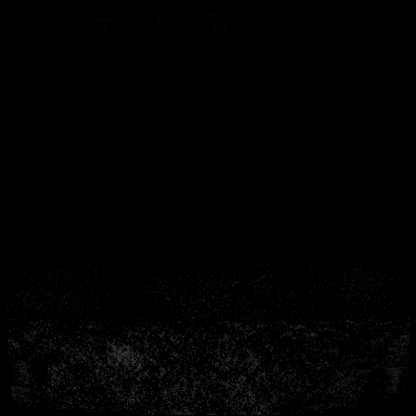
[im 32/128]
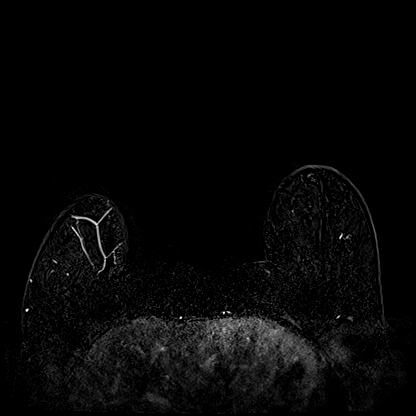
[im 64/128]
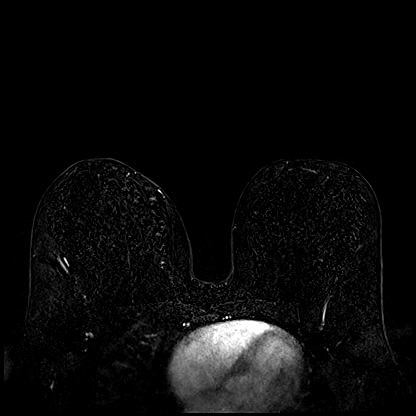
[im 96/128]
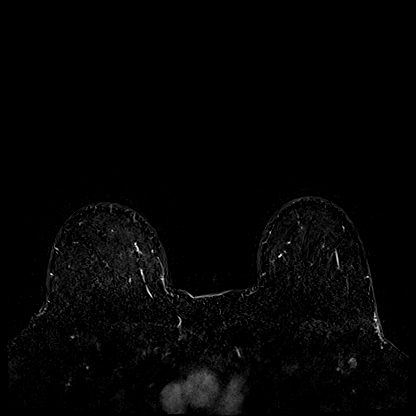
[im 128/128]
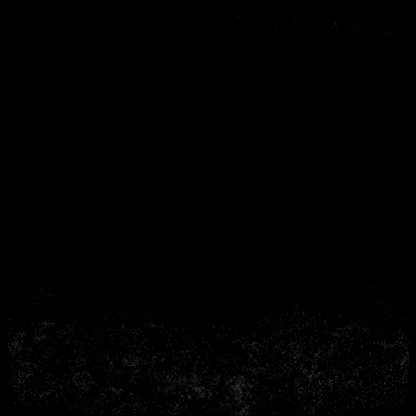

[Series 8: T1 · coronal · 350.0mm · 0.84mm/px · 1 of 3 slices shown (2 of 2)]
[im 1/3]
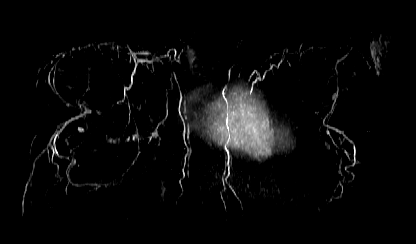

[Series 10: T1 fat-sat · axial · 1.6mm · 0.84mm/px · z∈[-86,-36]mm · 2 of 128 slices shown (4 of 4)]
[im 1/128]
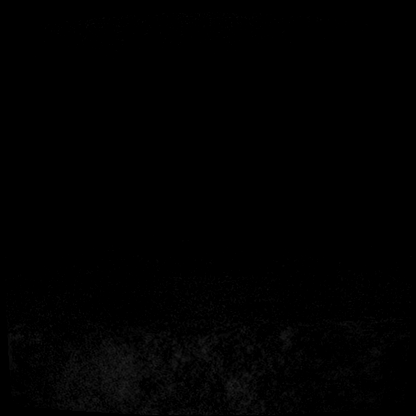
[im 32/128]
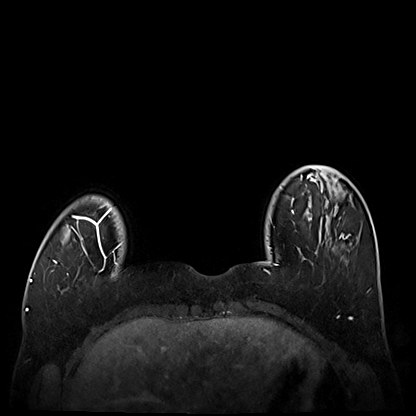

[29 of 48 positions shown; findings below may reference images not displayed]

Three-dimensional MR images were rendered by post-processing of the
original MR data on an independent workstation. The
three-dimensional MR images were interpreted, and findings are
reported in the following complete MRI report for this study. Three
dimensional images were evaluated at the independent [employer]
workstation
FINDINGS: Breast composition: d. Extreme fibroglandular tissue.

Background parenchymal enhancement: Minimal

Right breast: There is non mass enhancement in the medial aspect of
the right breast, close to 3 o'clock, surrounding susceptibility
artifact reflecting the coil shaped clip from the recent
stereotactic breast biopsy. The enhancement spans 4.3 cm right to
left, 1.5 cm anterior-posterior and 1.3 cm superior to inferior.

There are no other areas of abnormal right breast enhancement. No
masses.

Left breast: No mass or abnormal enhancement.

Lymph nodes: No abnormal appearing lymph nodes.

Ancillary findings: T2 signal hyperintense mass within the liver
represents a stable hemangioma, most recently visualized on CT dated
[DATE].
IMPRESSION: 1. Abnormal non mass enhancement surrounding medial right breast
post biopsy marker clip consistent with a combination post biopsy
related inflammatory enhancement and DCIS. This abnormal enhancement
spans 0.3 x 1.3 x 1.5 cm.
2. No additional disease. Specifically, no other evidence of right
breast malignancy. No evidence of left breast malignancy. No
abnormal or enlarged axillary lymph nodes.

RECOMMENDATION:
Treatment as planned for the known right breast DCIS.

BI-RADS CATEGORY  6: Known biopsy-proven malignancy.

## 2019-09-08 MED ORDER — GADOBUTROL 1 MMOL/ML IV SOLN
20.0000 mL | Freq: Once | INTRAVENOUS | Status: AC | PRN
  Administered 2019-09-08: 10 mL via INTRAVENOUS

## 2019-09-09 ENCOUNTER — Telehealth: Payer: Self-pay | Admitting: *Deleted

## 2019-09-09 ENCOUNTER — Encounter: Payer: Self-pay | Admitting: *Deleted

## 2019-09-09 NOTE — Telephone Encounter (Signed)
Left message for a return phone call to follow up from [facility] and to assess any navigation needs.

## 2019-09-10 ENCOUNTER — Telehealth: Payer: Self-pay | Admitting: Genetic Counselor

## 2019-09-10 ENCOUNTER — Encounter: Payer: Self-pay | Admitting: Genetic Counselor

## 2019-09-10 ENCOUNTER — Other Ambulatory Visit: Payer: Self-pay | Admitting: Surgery

## 2019-09-10 DIAGNOSIS — Z1379 Encounter for other screening for genetic and chromosomal anomalies: Secondary | ICD-10-CM | POA: Insufficient documentation

## 2019-09-10 DIAGNOSIS — C50911 Malignant neoplasm of unspecified site of right female breast: Secondary | ICD-10-CM

## 2019-09-10 DIAGNOSIS — Z1509 Genetic susceptibility to other malignant neoplasm: Secondary | ICD-10-CM | POA: Insufficient documentation

## 2019-09-10 DIAGNOSIS — D0511 Intraductal carcinoma in situ of right breast: Secondary | ICD-10-CM

## 2019-09-10 DIAGNOSIS — Z1501 Genetic susceptibility to malignant neoplasm of breast: Secondary | ICD-10-CM | POA: Insufficient documentation

## 2019-09-10 NOTE — Telephone Encounter (Signed)
Disclosed positive genetic test results:  Genetic testing detected a pathogenic variant in the BRCA2 gene called c.5946del (p.[id number]). This result explains why she has developed breast cancer. We have scheduled a follow-up appointment on Monday 8/16 at 3pm to review these results in more detail.

## 2019-09-10 NOTE — Telephone Encounter (Signed)
LVM that her genetic test results are available and requested that she call back to discuss them.  

## 2019-09-13 ENCOUNTER — Inpatient Hospital Stay (HOSPITAL_BASED_OUTPATIENT_CLINIC_OR_DEPARTMENT_OTHER): Payer: BC Managed Care – PPO | Admitting: Genetic Counselor

## 2019-09-13 DIAGNOSIS — Z1502 Genetic susceptibility to malignant neoplasm of ovary: Secondary | ICD-10-CM | POA: Diagnosis not present

## 2019-09-13 DIAGNOSIS — Z1501 Genetic susceptibility to malignant neoplasm of breast: Secondary | ICD-10-CM | POA: Diagnosis not present

## 2019-09-13 DIAGNOSIS — Z1379 Encounter for other screening for genetic and chromosomal anomalies: Secondary | ICD-10-CM | POA: Diagnosis not present

## 2019-09-13 DIAGNOSIS — Z1509 Genetic susceptibility to other malignant neoplasm: Secondary | ICD-10-CM | POA: Diagnosis not present

## 2019-09-13 NOTE — Progress Notes (Signed)
GENETIC TEST RESULTS   Patient Name: Vanessa Allen Patient Age: 54 y.o. Encounter Date: 09/13/2019  Referring Provider: Chauncey Cruel, MD [address] [place],  [place] [address]    Vanessa Allen was seen in the [facility] on 09/01/2019 due to a personal and family history of cancer and concern regarding a hereditary predisposition to cancer in the family. Please refer to the prior Genetics clinic note for more information regarding Vanessa Allen's medical and family histories and our assessment at the time.   FAMILY HISTORY:  We obtained a detailed, 4-generation family history.  Significant diagnoses are listed below: Family History  Problem Relation Age of Onset  . Hyperlipidemia Mother   . Hypertension Mother   . Diabetes Father   . Heart disease Father   . Stomach cancer Father 67  . Hypertension Brother   . Hypertension Daughter   . Leukemia Paternal Aunt        dx. >50  . Breast cancer Niece 31  . BRCA 1/2 Niece   . Ovarian cancer Cousin        d. <50 (maternal first cousin)  . Ovarian cancer Cousin        d. <50 (maternal first cousin)  . Ovarian cancer Cousin        d. late 60s (maternal first cousin)    Vanessa Allen does not have children. She is the youngest of three brothers and two sisters. The oldest of her siblings is 3, and none of her siblings have had cancer. She has a niece (her brother's daughter) who had breast cancer diagnosed at the age of 46 and is BRCA positive. Her niece's mother reportedly had negative genetic testing.   Vanessa Allen mother is 64 and has not had cancer. Vanessa Allen had five maternal aunts and two maternal uncles, none of whom have had cancer. She has had three maternal first cousins with ovarian cancer - two were diagnosed younger than 29, and the third was in her late 52s. Vanessa Allen maternal grandmother died at the age of 69 and did not have cancer, and she does not have information about her maternal  grandfather.   Vanessa Allen father died at the age of 41 from stomach cancer. She had one paternal aunt and one paternal uncle. Her aunt died from leukemia when she was older than 18. There is no known cancer among paternal cousins. Her paternal grandmother died in her 70s or 4s, and she does not have information about her paternal grandfather.  Vanessa Allen is aware of previous family history of genetic testing for hereditary cancer risks in her niece, who is BRCA positive. Patient's maternal ancestors are of [place] and [place] descent, and paternal ancestors are of [place] and [place] descent. There is no reported Ashkenazi Jewish ancestry. There is no known consanguinity.  GENETIC TESTING: Genetic testing reported on 09/09/2019 through the Breast Cancer STAT Panel offered by [facility]. A single, heterozygous pathogenic variant was detected in the BRCA2 gene called c.[id number] (p.[id number]).   Additional genetic testing is currently pending through the Common Hereditary Cancers Panel.  The Breast Cancer STAT Panel offered by [facility] includes sequencing and deletion/duplication analysis for the following 9 genes:  ATM, BRCA1, BRCA2, CDH1, CHEK2, PALB2, PTEN, STK11 and TP53. The test report will be scanned into EPIC and located under the Molecular Pathology section of the Results Review tab.  A portion of the result report is included below for reference.     CANCER  RISKS:  Both women and men with a BRCA2 mutation are at an increased risk for cancer. Studies show that women with a BRCA2 mutation can have a 61-77% lifetime risk to develop breast cancer, up to a 26% risk to develop a second breast cancer within 20 years, and up to a 11-25% risk to develop ovarian cancer. Men can have a 7-8% lifetime risk to develop female breast cancer and a 20-30% risk for prostate cancer. Both men and women can also have a 3-5% increased risk for pancreatic cancer and a 3-5% increased risk for  melanoma.  CANCER RISK REDUCTION & SCREENING RECOMMENDATIONS: The National Comprehensive Cancer Network (Genetic/Familial High-Risk Assessment: Breast, Ovarian, and Pancreatic Version 1.2022) recommends the following for women who carry BRCA mutations: . Breast awareness starting at the age of 40 years; women should report any changes to their breasts to their health care provider. Periodic breast self-exams may facilitate breast self-awareness. . Clinical breast exams every 6-12 months, starting at age 11 years . Breast Screening o Age 67-29 years - annual breast MRI screening with contrast (or mammogram with consideration of tomosynthesis, only if MRI is unavailable), or individualized based on family history if a breast cancer diagnosis before age 26 is present. o Age 82-75 years - annual mammogram with consideration of tomosynthesis, and breast MRI screening with contrast. o Age >75 years - management should be considered on an individual basis. o For individuals with a BRCA pathogenic/likely pathogenic variant who are treated for breast cancer and have not had a bilateral mastectomy, screening should continue as described above. . Consideration of risk reducing mastectomy, which reduces the risk of breast cancer by greater than 90%. . Consideration of risk reducing salpingo-oophorectomy (RRSO), typically between the ages of 21-45, or individualized based on completion of child-bearing years or age of earliest onset of ovarian cancer in the family. RRSO reduces the risk of ovarian cancer by greater than 90% and can reduce the risk of breast cancer by 50% if performed prior to menopause. Women who have undergone risk reducing RRSO have a small risk of peritoneal carcinoma and can consider annual CA-125 surveillance. . For women who still have their ovaries, transvaginal ultrasound and CA-125 testing every 6 months starting at age 55 years, or 5-10 years prior to the earliest age of onset of ovarian  cancer in the family can be considered. Studies have not demonstrated that ovarian cancer screening is effective in detecting early ovarian cancer. . Consideration of chemoprevention options such as oral contraceptives, Tamoxifen and Raloxifene, if appropriate for an individual.  As discussed with Ms. Ambrosius, to reduce the risk for breast cancer, prophylactic bilateral mastectomy is the most effective option for risk reduction. However, for women who choose to keep their breasts, intensified screening is equally safe. We recommend yearly mammograms, yearly breast MRI, twice-yearly clinical breast exams, and monthly self-breast exams. Ms. Bonk will follow up with her oncologist, Dr. Jana Hakim, and surgeon, Dr. Brantley Stage in regards to her risk for breast cancer and surgery options.  To reduce the risk for ovarian cancer, we recommend Ms. Southwell have a prophylactic bilateral salpingo-oophorectomy. We discussed that screening with CA-125 blood tests and transvaginal ultrasounds can be done twice per year. However, these tests have not been shown to detect ovarian cancer at an early stage.  Ms. Nawaz plans to follow up with Dr. Jana Hakim in regards to her ovarian cancer risk.  The [employer]  (NCCN) recommends the following for men who carry a BRCA2 mutation: .  Breast self-exam training and education starting at age 67 years . Clinical breast exam, every 12 months, starting at age 47 years . Consider annual mammogram screening in men with gynecomastia starting at age 20, or 13 years before the earliest known female breast cancer in the family (whichever comes first) . Prostate cancer screening starting at age 73 years (PSA and digital rectal exam)  The following is recommended for both men and women who carry a BRCA2 mutation: . Consideration of pancreatic cancer screening if there is a family history of pancreatic cancer . General melanoma risk management, such as annual full-body  skin examination and minimizing UV exposure . Consider investigational imaging and screening studies, when available (e.g. novel imaging technologies, more frequent screening intervals) in the context of a clinical trial  RISK REDUCTION: There are several things that can be offered to individuals who are carriers for BRCA mutations that will reduce the risk for getting cancer.    The use of oral contraceptives can lower the risk for ovarian cancer, and, per case control studies, does not significantly increase the risk for breast cancer in BRCA patients.  Case control studies have shown that oral contraceptives can lower the risk for ovarian cancer in women with BRCA mutations. Additionally, a more recent meta-analysis, including one corhort (n=3,181) and one case control study (1,096 cases and 2,878 controls) also showed an inverse correlation between ovarian cancer and ever having used oral contraceptives (OR, 0.58; 95% CI = 0.46-0.73).  Studies on oral contraceptives and breast cancer have been conflicting, with some studies suggesting that there is not an increased risk for breast cancer in BRCA mutation carriers, while others suggest that there could be a risk.  That said, two meta-analysis studies have shown that there is not an increased risk for breast cancer with oral contraceptive use in BRCA1 and BRCA2 carriers.    In individuals who have a prophylactic bilateral salpino-oophorectomy (BSO) prior to menopause, the risk for breast cancer may be reduced by up to 50%.  It has been reported that short term hormone replacement therapy in women undergoing prophylactic BSO does not negate the reduction of breast cancer risk associated with surgery.  FAMILY MEMBERS: It is important that all of Ms. Handel's relatives (both men and women) know of the presence of this gene mutation. Site-specific genetic testing can sort out who in the family is at risk and who is not.    Ms. Vink siblings each have a  50% chance to have inherited this mutation. We recommend they have genetic testing for this same mutation, as identifying the presence of this mutation would allow them to also take advantage of risk-reducing measures.   Additionally, individuals with a pathogenic variant in BRCA2 are carriers of Fanconi anemia. Fanconi anemia is an autosomal recessive disorder that is characterized by bone marrow failure and variable presentation of anomalies, including short stature, abnormal skin pigmentation, abnormal thumbs, malformations of the skeletal and central nervous systems, and developmental delay. Risks for leukemia and early onset solid tumors are significantly elevated. For there to be a risk of Fanconi anemia in offspring, both parents would each have to have a single pathogenic variant in BRCA2; in such a case, the risk of having an affected child is 25%.  SUPPORT AND RESOURCES: If Ms. Brisbon is interested in BRCA-specific information and support, there are two groups, Facing Our Risk (www.facingourrisk.com) and Bright Pink (www.brightpink.org) which some people have found useful. They provide opportunities to speak with other individuals  from high-risk families. To locate genetic counselors in other cities, visit the website of the [employer] (ArtistMovie.se) and Secretary/administrator for a Social worker by zip code.  We encouraged Ms. Parkey to remain in contact with [place] on an annual basis so we can update her personal and family histories, and let her know of advances in cancer genetics that may benefit the family. Our contact number was provided. Ms. Mcelwain questions were answered to her satisfaction today, and she knows she is welcome to call anytime with additional questions.   Clint Guy, [place], [facility] Licensed, Certified Dispensing [email or web address] phone: [phone]  The patient was seen for a total of 45 minutes in face-to-face genetic counseling.

## 2019-09-15 NOTE — Progress Notes (Signed)
[facility] [place], [address] AT [place] [address] Phone: [phone] Fax: [phone]  [id number] - [place] [address], [address] AT [place] [address] [place] [address] Phone: [phone] Fax: [phone]      Your procedure is scheduled on August 26  Report to [facility] Main Entrance "A" at 0930 A.M., and check in at the Admitting office.  Call this number if you have problems the morning of surgery:  [phone]  Call [phone] if you have any questions prior to your surgery date Monday-Friday 8am-4pm    Remember:  Do not eat after midnight the night before your surgery  You may drink clear liquids until 0830 am the morning of your surgery.   Clear liquids allowed are: Water, Non-Citrus Juices (without pulp), Carbonated Beverages, Clear Tea, Black Coffee Only, and Gatorade    Take these medicines the morning of surgery with A SIP OF WATER  ALPRAZolam (XANAX)  If needed esomeprazole (NEXIUM)  estradiol (VIVELLE-DOT) if needed   Follow your surgeon's instructions on when to stop Aspirin.  If no instructions were given by your surgeon then you will need to call the office to get those instructions.    As of today, STOP taking any Aleve, Naproxen, Ibuprofen, Motrin, Advil, Goody's, BC's, all herbal medications, fish oil, and all vitamins. meloxicam (MOBIC)   WHAT DO I DO ABOUT MY DIABETES MEDICATION?   Marland Kitchen Do not take oral diabetes medicines (pills) the morning of surgery. metFORMIN (GLUCOPHAGE, Semaglutide (RYBELSUS)   HOW TO MANAGE YOUR DIABETES BEFORE AND AFTER SURGERY  Why is it important to control my blood sugar before and after surgery? . Improving blood sugar levels before and after surgery helps healing and can limit problems. . A way of improving blood sugar control is eating a healthy diet by: o  Eating less sugar and  carbohydrates o  Increasing activity/exercise o  Talking with your doctor about reaching your blood sugar goals . High blood sugars (greater than 180 mg/dL) can raise your risk of infections and slow your recovery, so you will need to focus on controlling your diabetes during the weeks before surgery. . Make sure that the doctor who takes care of your diabetes knows about your planned surgery including the date and location.  How do I manage my blood sugar before surgery? . Check your blood sugar at least 4 times a day, starting 2 days before surgery, to make sure that the level is not too high or low. . Check your blood sugar the morning of your surgery when you wake up and every 2 hours until you get to the Short Stay unit. o If your blood sugar is less than 70 mg/dL, you will need to treat for low blood sugar: - Do not take insulin. - Treat a low blood sugar (less than 70 mg/dL) with  cup of clear juice (cranberry or apple), 4 glucose tablets, OR glucose gel. - Recheck blood sugar in 15 minutes after treatment (to make sure it is greater than 70 mg/dL). If your blood sugar is not greater than 70 mg/dL on recheck, call [phone] for further instructions. . Report your blood sugar to the short stay nurse when you get to Short Stay.  . If you are admitted to the hospital after surgery: o Your blood sugar will be checked by the staff and you  will probably be given insulin after surgery (instead of oral diabetes medicines) to make sure you have good blood sugar levels. o The goal for blood sugar control after surgery is 80-180 mg/dL.                     Do not wear jewelry, make up, or nail polish            Do not wear lotions, powders, perfumes/colognes, or deodorant.            Do not shave 48 hours prior to surgery.             Do not bring valuables to the hospital.            [facility] is not responsible for any belongings or valuables.  Do NOT Smoke (Tobacco/Vaping) or drink  Alcohol 24 hours prior to your procedure If you use a CPAP at night, you may bring all equipment for your overnight stay.   Contacts, glasses, dentures or bridgework may not be worn into surgery.      For patients admitted to the hospital, discharge time will be determined by your treatment team.   Patients discharged the day of surgery will not be allowed to drive home, and someone needs to stay with them for 24 hours.    Special instructions:   [facility]- Preparing For Surgery  Before surgery, you can play an important role. Because skin is not sterile, your skin needs to be as free of germs as possible. You can reduce the number of germs on your skin by washing with CHG (chlorahexidine gluconate) Soap before surgery.  CHG is an antiseptic cleaner which kills germs and bonds with the skin to continue killing germs even after washing.    Oral Hygiene is also important to reduce your risk of infection.  Remember - BRUSH YOUR TEETH THE MORNING OF SURGERY WITH YOUR REGULAR TOOTHPASTE  Please do not use if you have an allergy to CHG or antibacterial soaps. If your skin becomes reddened/irritated stop using the CHG.  Do not shave (including legs and underarms) for at least 48 hours prior to first CHG shower. It is OK to shave your face.  Please follow these instructions carefully.   1. Shower the NIGHT BEFORE SURGERY and the MORNING OF SURGERY with CHG Soap.   2. If you chose to wash your hair, wash your hair first as usual with your normal shampoo.  3. After you shampoo, rinse your hair and body thoroughly to remove the shampoo.  4. Use CHG as you would any other liquid soap. You can apply CHG directly to the skin and wash gently with a scrungie or a clean washcloth.   5. Apply the CHG Soap to your body ONLY FROM THE NECK DOWN.  Do not use on open wounds or open sores. Avoid contact with your eyes, ears, mouth and genitals (private parts). Wash Face and genitals (private parts)  with  your normal soap.   6. Wash thoroughly, paying special attention to the area where your surgery will be performed.  7. Thoroughly rinse your body with warm water from the neck down.  8. DO NOT shower/wash with your normal soap after using and rinsing off the CHG Soap.  9. Pat yourself dry with a CLEAN TOWEL.  10. Wear CLEAN PAJAMAS to bed the night before surgery  11. Place CLEAN SHEETS on your bed the night of your first shower and DO  NOT SLEEP WITH PETS.   Day of Surgery: Wear Clean/Comfortable clothing the morning of surgery Do not apply any deodorants/lotions.   Remember to brush your teeth WITH YOUR REGULAR TOOTHPASTE.   Please read over the following fact sheets that you were given.

## 2019-09-16 ENCOUNTER — Encounter (HOSPITAL_COMMUNITY): Payer: Self-pay

## 2019-09-16 ENCOUNTER — Encounter: Payer: Self-pay | Admitting: *Deleted

## 2019-09-16 ENCOUNTER — Other Ambulatory Visit: Payer: Self-pay

## 2019-09-16 ENCOUNTER — Encounter: Payer: Self-pay | Admitting: Genetic Counselor

## 2019-09-16 ENCOUNTER — Encounter (HOSPITAL_COMMUNITY)
Admission: RE | Admit: 2019-09-16 | Discharge: 2019-09-16 | Disposition: A | Discharge status: Home / Self Care | Payer: BC Managed Care – PPO | Source: Ambulatory Visit | Attending: Surgery | Admitting: Surgery

## 2019-09-16 DIAGNOSIS — Z01812 Encounter for preprocedural laboratory examination: Secondary | ICD-10-CM | POA: Diagnosis not present

## 2019-09-16 DIAGNOSIS — D0511 Intraductal carcinoma in situ of right breast: Secondary | ICD-10-CM

## 2019-09-16 DIAGNOSIS — C50911 Malignant neoplasm of unspecified site of right female breast: Secondary | ICD-10-CM

## 2019-09-16 HISTORY — DX: Malignant (primary) neoplasm, unspecified: C80.1

## 2019-09-16 HISTORY — DX: Gastro-esophageal reflux disease without esophagitis: K21.9

## 2019-09-16 LAB — CBC WITH DIFFERENTIAL/PLATELET
Abs Immature Granulocytes: 0.03 10*3/uL (ref 0.00–0.07)
Basophils Absolute: 0 10*3/uL (ref 0.0–0.1)
Basophils Relative: 1 %
Eosinophils Absolute: 0.1 10*3/uL (ref 0.0–0.5)
Eosinophils Relative: 1 %
HCT: 41.4 % (ref 36.0–46.0)
Hemoglobin: 13.3 g/dL (ref 12.0–15.0)
Immature Granulocytes: 1 %
Lymphocytes Relative: 39 %
Lymphs Abs: 2.4 10*3/uL (ref 0.7–4.0)
MCH: 27.3 pg (ref 26.0–34.0)
MCHC: 32.1 g/dL (ref 30.0–36.0)
MCV: 84.8 fL (ref 80.0–100.0)
Monocytes Absolute: 0.3 10*3/uL (ref 0.1–1.0)
Monocytes Relative: 5 %
Neutro Abs: 3.3 10*3/uL (ref 1.7–7.7)
Neutrophils Relative %: 53 %
Platelets: 268 10*3/uL (ref 150–400)
RBC: 4.88 MIL/uL (ref 3.87–5.11)
RDW: 13.2 % (ref 11.5–15.5)
WBC: 6.2 10*3/uL (ref 4.0–10.5)
nRBC: 0 % (ref 0.0–0.2)

## 2019-09-16 LAB — COMPREHENSIVE METABOLIC PANEL
ALT: 33 U/L (ref 0–44)
AST: 30 U/L (ref 15–41)
Albumin: 4.1 g/dL (ref 3.5–5.0)
Alkaline Phosphatase: 69 U/L (ref 38–126)
Anion gap: 9 (ref 5–15)
BUN: 14 mg/dL (ref 6–20)
CO2: 23 mmol/L (ref 22–32)
Calcium: 9.4 mg/dL (ref 8.9–10.3)
Chloride: 106 mmol/L (ref 98–111)
Creatinine, Ser: 0.85 mg/dL (ref 0.44–1.00)
GFR calc Af Amer: >60 mL/min (ref >60)
GFR calc non Af Amer: >60 mL/min (ref >60)
Glucose, Bld: 135 mg/dL — ABNORMAL HIGH (ref 70–99)
Potassium: 4.1 mmol/L (ref 3.5–5.1)
Sodium: 138 mmol/L (ref 135–145)
Total Bilirubin: 0.7 mg/dL (ref 0.3–1.2)
Total Protein: 7 g/dL (ref 6.5–8.1)

## 2019-09-16 LAB — GLUCOSE, CAPILLARY: Glucose-Capillary: 144 mg/dL — ABNORMAL HIGH (ref 70–99)

## 2019-09-16 LAB — HEMOGLOBIN A1C
Hgb A1c MFr Bld: 6.4 % — ABNORMAL HIGH (ref 4.8–5.6)
Mean Plasma Glucose: 136.98 mg/dL

## 2019-09-16 NOTE — Progress Notes (Signed)
PCP - [name]  Chest x-ray - n/a EKG - 07-26-19  SA - yes, does not wear CPAP   Fasting Blood Sugar - 80s - low 100s  Aspirin Instructions: stop taking ASA as of today  ERAS Protcol - yes, no drink ordered or given   COVID TEST- 09-20-19   Anesthesia review: n/a  Patient denies shortness of breath, fever, cough and chest pain at PAT appointment   All instructions explained to the patient, with a verbal understanding of the material. Patient agrees to go over the instructions while at home for a better understanding. Patient also instructed to self quarantine after being tested for COVID-19. The opportunity to ask questions was provided.

## 2019-09-20 ENCOUNTER — Ambulatory Visit (HOSPITAL_COMMUNITY)
Admission: RE | Admit: 2019-09-20 | Discharge: 2019-09-20 | Disposition: A | Discharge status: Home / Self Care | Payer: BC Managed Care – PPO | Source: Ambulatory Visit | Attending: Surgery | Admitting: Surgery

## 2019-09-20 ENCOUNTER — Other Ambulatory Visit (HOSPITAL_COMMUNITY): Payer: BC Managed Care – PPO

## 2019-09-20 ENCOUNTER — Ambulatory Visit (INDEPENDENT_AMBULATORY_CARE_PROVIDER_SITE_OTHER): Payer: BC Managed Care – PPO | Admitting: Surgery

## 2019-09-20 ENCOUNTER — Encounter: Payer: Self-pay | Admitting: Surgery

## 2019-09-20 ENCOUNTER — Other Ambulatory Visit (HOSPITAL_COMMUNITY)
Admission: RE | Admit: 2019-09-20 | Discharge: 2019-09-20 | Disposition: A | Discharge status: Home / Self Care | Payer: BC Managed Care – PPO | Source: Ambulatory Visit | Attending: Surgery | Admitting: Surgery

## 2019-09-20 ENCOUNTER — Telehealth: Payer: Self-pay | Admitting: Genetic Counselor

## 2019-09-20 ENCOUNTER — Other Ambulatory Visit: Payer: Self-pay

## 2019-09-20 ENCOUNTER — Encounter: Payer: Self-pay | Admitting: *Deleted

## 2019-09-20 VITALS — BP 120/84 | HR 83 | Temp 98.0°F | Resp 20 | Ht 66.0 in | Wt 182.6 lb

## 2019-09-20 DIAGNOSIS — Z1501 Genetic susceptibility to malignant neoplasm of breast: Secondary | ICD-10-CM | POA: Diagnosis not present

## 2019-09-20 DIAGNOSIS — E1151 Type 2 diabetes mellitus with diabetic peripheral angiopathy without gangrene: Secondary | ICD-10-CM | POA: Diagnosis not present

## 2019-09-20 DIAGNOSIS — I739 Peripheral vascular disease, unspecified: Secondary | ICD-10-CM

## 2019-09-20 DIAGNOSIS — I872 Venous insufficiency (chronic) (peripheral): Secondary | ICD-10-CM | POA: Diagnosis not present

## 2019-09-20 DIAGNOSIS — Z01818 Encounter for other preprocedural examination: Secondary | ICD-10-CM | POA: Diagnosis not present

## 2019-09-20 DIAGNOSIS — Z20822 Contact with and (suspected) exposure to covid-19: Secondary | ICD-10-CM | POA: Insufficient documentation

## 2019-09-20 DIAGNOSIS — G4731 Primary central sleep apnea: Secondary | ICD-10-CM | POA: Insufficient documentation

## 2019-09-20 DIAGNOSIS — Z1509 Genetic susceptibility to other malignant neoplasm: Secondary | ICD-10-CM | POA: Diagnosis not present

## 2019-09-20 DIAGNOSIS — D0511 Intraductal carcinoma in situ of right breast: Secondary | ICD-10-CM | POA: Diagnosis not present

## 2019-09-20 LAB — SARS CORONAVIRUS 2 (TAT 6-24 HRS): SARS Coronavirus 2: NEGATIVE

## 2019-09-20 NOTE — Addendum Note (Signed)
Addended by: Larey Seat on: 09/20/2019 01:31 PM   Modules accepted: Orders

## 2019-09-20 NOTE — Progress Notes (Signed)
Vascular and Vein Specialist of [facility]  Patient name: Vanessa Allen MRN: [id number] DOB: [birth year] Sex: female   REQUESTING PROVIDER:    Darleen Crocker   REASON FOR CONSULT:    Left leg wound  HISTORY OF PRESENT ILLNESS:   Vanessa Allen is a 54 y.o. female, who is referred for a chronic left leg wound that will heal and then reopened.  It all started several years ago following a scratch while she was in [place].  It has been biopsied.  She has been treated at the wound center.  It does continue to recur.  She does wear compression stockings.  There has been some darkening of the skin around the ankle.  Currently there is no active ulceration.  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  She denies any history of DVT  Patient suffers from diabetes.  She has a history of right breast cancer she is a non-smoker.  She is medically managed for hypertension and takes a statin for hypercholesterolemia.  PAST MEDICAL HISTORY    Past Medical History:  Diagnosis Date  . Cancer [facility])    Right Breast Cancer  . Diabetes mellitus without complication ([place])   . Family history of BRCA gene mutation   . Family history of breast cancer   . Family history of leukemia   . Family history of ovarian cancer   . Family history of stomach cancer   . GERD (gastroesophageal reflux disease)      FAMILY HISTORY   Family History  Problem Relation Age of Onset  . Hyperlipidemia Mother   . Hypertension Mother   . Diabetes Father   . Heart disease Father   . Stomach cancer Father 62  . Hypertension Brother   . Hypertension Daughter   . Leukemia Paternal Aunt        dx. >50  . Breast cancer Niece 30  . BRCA 1/2 Niece   . Ovarian cancer Cousin        d. <50 (maternal first cousin)  . Ovarian cancer Cousin        d. <50 (maternal first cousin)  . Ovarian cancer  Cousin        d. late 4s (maternal first cousin)    SOCIAL HISTORY:   Social History   Socioeconomic History  . Marital status: Single    Spouse name: Not on file  . Number of children: Not on file  . Years of education: Not on file  . Highest education level: Not on file  Occupational History  . Not on file  Tobacco Use  . Smoking status: Never Smoker  . Smokeless tobacco: Never Used  Vaping Use  . Vaping Use: Never used  Substance and Sexual Activity  . Alcohol use: Yes    Alcohol/week: 0.0 standard drinks    Comment: occasional  . Drug use: No  . Sexual activity: Not on file  Other Topics Concern  . Not on file  Social History Narrative  . Not on file   Social Determinants of Health   Financial Resource Strain:   . Difficulty of Paying Living Expenses: Not on file  Food Insecurity:   . Worried About Charity fundraiser in the Last Year: Not on file  . Ran Out of Food in the Last Year: Not on file  Transportation Needs:   . Lack  of Transportation (Medical): Not on file  . Lack of Transportation (Non-Medical): Not on file  Physical Activity:   . Days of Exercise per Week: Not on file  . Minutes of Exercise per Session: Not on file  Stress:   . Feeling of Stress : Not on file  Social Connections:   . Frequency of Communication with Friends and Family: Not on file  . Frequency of Social Gatherings with Friends and Family: Not on file  . Attends Religious Services: Not on file  . Active Member of Clubs or Organizations: Not on file  . Attends Archivist Meetings: Not on file  . Marital Status: Not on file  Intimate Partner Violence:   . Fear of Current or Ex-Partner: Not on file  . Emotionally Abused: Not on file  . Physically Abused: Not on file  . Sexually Abused: Not on file    ALLERGIES:    No Known Allergies  CURRENT MEDICATIONS:    Current Outpatient Medications  Medication Sig Dispense Refill  . ACCU-CHEK GUIDE test strip USE  TWICE DAILY TO CHECK BLOOD SUGAR BEFORE BREAKFAST AND DINNER 100 strip 5  . acidophilus (RISAQUAD) CAPS capsule Take 1 capsule by mouth daily.    Marland Kitchen ALPRAZolam (XANAX) 0.25 MG tablet Take 1 tablet (0.25 mg total) by mouth 3 (three) times daily as needed for anxiety. 30 tablet 0  . amitriptyline (ELAVIL) 25 MG tablet Take 1 tablet (25 mg total) by mouth at bedtime. 30 tablet 3  . aspirin EC 81 MG tablet Take 81 mg by mouth daily. Swallow whole.    Marland Kitchen atorvastatin (LIPITOR) 10 MG tablet TAKE 1 TABLET(10 MG) BY MOUTH DAILY (Patient taking differently: Take 10 mg by mouth every 7 (seven) days. ) 90 tablet 1  . Biotin w/ Vitamins C & E (HAIR/SKIN/NAILS PO) Take 1 tablet by mouth daily.    . Coenzyme Q10 (CO Q-10) 100 MG CAPS Take by mouth.    . Continuous Blood Gluc Receiver (FREESTYLE LIBRE 14 DAY READER) DEVI USE TO CHECK BLOOD SUGAR BEFORE MEALS AND AT BEDTIME 1 each 2  . Continuous Blood Gluc Sensor (FREESTYLE LIBRE 14 DAY SENSOR) MISC USE TO MEASURE BLOOD GLUCOSE FOUR TIMES DAILY BEFORE MEALS AND AT BEDTIME 2 each 11  . cyclobenzaprine (FLEXERIL) 10 MG tablet Take 10 mg by mouth at bedtime as needed for muscle spasms.     . DAYVIGO 10 MG TABS Take by mouth.     . esomeprazole (NEXIUM) 20 MG capsule GENERIC FOR NEXIUM. TAKE 1 CAPSULE BY MOUTH DAILY AT LEAST 1 HOUR BEFORE A MEAL. SWALLOWING WHOLE. DO NOT CRUSH OR CHEW GRANULES (Patient taking differently: Take 20 mg by mouth daily. ) 90 capsule 0  . estradiol (VIVELLE-DOT) 0.025 MG/24HR APPLY TO SKIN TWICE WEEKLY AS DIRECTED 8 patch 1  . FARXIGA 10 MG TABS tablet Take 10 mg by mouth every morning.     . fluticasone (FLONASE) 50 MCG/ACT nasal spray as needed.   0  . Glucose Blood (GLUCOSE METER TEST VI) by In Vitro route 2 (two) times daily.    . meloxicam (MOBIC) 15 MG tablet Take 15 mg by mouth daily as needed for pain.     . metFORMIN (GLUCOPHAGE) 500 MG tablet TAKE 1 TABLET BY MOUTH EVERY DAY WITH BREAKFAST (Patient taking differently: Take 500 mg  by mouth daily with breakfast. TAKE 1 TABLET BY MOUTH EVERY DAY WITH BREAKFAST) 90 tablet 1  . Multiple Vitamin (MULTIVITAMIN WITH MINERALS) TABS tablet  Take 1 tablet by mouth daily.    . Omega-3 1000 MG CAPS Take 1,000 mg by mouth daily.    . progesterone (PROMETRIUM) 100 MG capsule TAKE 2 CAPSULES BY MOUTH EVERY NIGHT AT BEDTIME FOR 2 WEEKS, THEN 3 CAPSULES EVERY NIGHT AT BEDTIME IF NEEDED. TAKE ON DAY 1 TO 27 OF EACH MONTH 81 capsule 1  . Semaglutide (RYBELSUS) 7 MG TABS Take 1 tablet by mouth daily. 30 tablet 3  . Suvorexant (BELSOMRA) 10 MG TABS Take 1 tablet by mouth at bedtime as needed. 30 tablet 0   No current facility-administered medications for this visit.    REVIEW OF SYSTEMS:   _0  denotes positive finding, _1  denotes negative finding Cardiac  Comments:  Chest pain or chest pressure:    Shortness of breath upon exertion:    Short of breath when lying flat:    Irregular heart rhythm:        Vascular    Pain in calf, thigh, or hip brought on by ambulation: x   Pain in feet at night that wakes you up from your sleep:  x   Blood clot in your veins:    Leg swelling:  x       Pulmonary    Oxygen at home:    Productive cough:     Wheezing:         Neurologic    Sudden weakness in arms or legs:     Sudden numbness in arms or legs:     Sudden onset of difficulty speaking or slurred speech:    Temporary loss of vision in one eye:     Problems with dizziness:         Gastrointestinal    Blood in stool:      Vomited blood:         Genitourinary    Burning when urinating:     Blood in urine:        Psychiatric    Major depression:         Hematologic    Bleeding problems:    Problems with blood clotting too easily:        Skin    Rashes or ulcers:        Constitutional    Fever or chills:     PHYSICAL EXAM:   Vitals:   09/20/19 1205  BP: 120/84  Pulse: 83  Resp: 20  Temp: 98 F (36.7 C)  TempSrc: Temporal  SpO2: 97%  Weight: 182 lb 9.6 oz (82.8  kg)  Height: _2  (1.676 m)    GENERAL: The patient is a well-nourished female, in no acute distress. The vital signs are documented above. CARDIAC: There is a regular rate and rhythm.  VASCULAR: Palpable pedal pulses bilaterally.   PULMONARY: Nonlabored respirations MUSCULOSKELETAL: There are no major deformities or cyanosis. NEUROLOGIC: No focal weakness or paresthesias are detected. SKIN: Hyperpigmentation around the medial malleolus on the left with evidence of healed ulceration. PSYCHIATRIC: The patient has a normal affect.  STUDIES:   I have reviewed the following:  +-------+-----------+-----------+------------+------------+  ABI/TBIToday's ABIToday's TBIPrevious ABIPrevious TBI  +-------+-----------+-----------+------------+------------+  Right 1.26    1.06                  +-------+-----------+-----------+------------+------------+  Left  1.24    0.95                  +-------+-----------+-----------+------------+------------+   Right toe:  133 Left toe 120  Triphasic waveforms ASSESSMENT and PLAN   Left leg wound: I suspect that there is a component of venous insufficiency contributing to her issues with wound healing.  I discussed the importance of wearing 20-30 compression socks and to keep her legs elevated so as to minimize the edema.  I would like for her to have a venous insufficiency ultrasound to see if this is a contributing factor.  Based on these results I will consider whether or not laser ablation is an option or whether or not she needs referral to a lymphedema clinic.  She will follow up with me after the studies have been completed.   Leia Alf, MD, FACS Vascular and Vein Specialists of [facility] [phone] Pager [phone]

## 2019-09-20 NOTE — Telephone Encounter (Signed)
LVM regarding genetic testing options for her family members in the [place]. Requested that she call back with any questions. We will also send her written information that she can provide to her relatives.

## 2019-09-20 NOTE — Progress Notes (Signed)
DIAGNOSIS  1. Mild complex sleep apnea responded to low pressure CPAP  therapy. I am not sure if the patient liked the Simplus FFM in  small size or not-     2. PLANS/RECOMMENDATIONS: Start CPAP 5-12 cm water with 1 cm EPR  and mask of choice. A change from FFM to nasal pillow or nasal  cradle is possible.   DISCUSSION:A follow up appointment will be scheduled in the  Sleep Clinic at [facility].  Please call  [phone] with any questions.

## 2019-09-20 NOTE — Procedures (Signed)
PATIENT'S NAME:  Vanessa Allen, Vanessa Allen DOB:      November 13, 1965      MR#:    [id number]     DATE OF RECORDING: 09/08/2019 MR REFERRING M.D.:  [name] FNP Study Performed:   CPAP  Titration HISTORY:  Jocabed E. Hank returns due to the listed results below:  Diagnostic polysomnogram performed on 07/25/2019 revealed: Mild Central Sleep Apnea (CSA) at AHI of 5.4/h. REM AHI was high at 16.6/h, which speaks more for a non- central form of apnea. Mild Periodic Limb Movement Disorder (PLMD) but many  spontaneous arousals followed movements which were non-periodic. Primary Snoring- consistently through non -REM and REM sleep.  The patient endorsed the Epworth Sleepiness Scale at 2 points.   The patient's weight 179 pounds with a height of 66 (inches), resulting in a BMI of 28.7 kg/m2. The patient's neck circumference measured 15 inches.  CURRENT MEDICATIONS: Lipitor, Nexium, estradiol, Farxiga, Flonase, Glucophage, Prometrium, Belsomra    PROCEDURE:  This is a multichannel digital polysomnogram utilizing the SomnoStar 11.2 system.  Electrodes and sensors were applied and monitored per AASM Specifications.   EEG, EOG, Chin and Limb EMG, were sampled at 200 Hz.  ECG, Snore and Nasal Pressure, Thermal Airflow, Respiratory Effort, CPAP Flow and Pressure, Oximetry was sampled at 50 Hz. Digital video and audio were recorded.      CPAP was initiated after the patient was fitted with a Small Simplus FFM mask, beginning at 5 cmH20 with heated humidity per AASM split night standards and pressure was advanced to 9 cmH20 because of hypopneas, apneas and desaturations.   At a PAP pressure of 9 cmH20, there was a reduction of the AHI to 1.1/h with improvement of sleep apnea.  Lights Out was at 21:57 and Lights On at 05:10. Total recording time (TRT) was 433.5 minutes, with a total sleep time (TST) of 387.5 minutes. The patient's sleep latency was 36 minutes. REM latency was 127 minutes.  The sleep efficiency was 89.4  %.    SLEEP ARCHITECTURE: WASO (Wake after sleep onset) was 37.5 minutes.  There were 31.5 minutes in Stage N1, 241 minutes Stage N2, 41.5 minutes Stage N3 and 73.5 minutes in Stage REM.  The percentage of Stage N1 was 8.1%, Stage N2 was 62.2%, Stage N3 was 10.7% and Stage R (REM sleep) was 19.%.   RESPIRATORY ANALYSIS:  There was a total of 19 respiratory events: 2 obstructive apneas, 0 central apneas and 1 mixed apnea with 16 hypopneas.      The total APNEA/HYPOPNEA INDEX  (AHI) was 2.9 /hour . 6 events occurred in REM sleep and 13 events in NREM. The REM AHI was 4.9 /hour versus a non-REM AHI of 2.5 /hour.  The patient spent 273 minutes of total sleep time in the supine position and 115 minutes in non-supine. The supine AHI was 4.2, versus a non-supine AHI of 0.0.  OXYGEN SATURATION & C02:  The baseline 02 saturation was 96%, with the lowest being 88%. Time spent below 89% saturation equaled 0 minutes.  The arousals were noted as: 54 were spontaneous, 0 were associated with PLMs, 14 were associated with respiratory events. The patient had a total of 0 Periodic Limb Movements. The Periodic Limb Movement (PLM) index was 0 and the PLM Arousal index was 0 /hour. Audio and video analysis did not show any abnormal or unusual movements, behaviors, phonations or vocalizations.   EKG was in keeping with normal sinus rhythm (NSR).   DIAGNOSIS 1. Mild complex  sleep apnea responded to low pressure CPAP therapy. I am not sure if the patient liked the Simplus FFM in small size or not-     2. PLANS/RECOMMENDATIONS: Start CPAP 5-12 cm water with 1 cm EPR and mask of choice. A change from FFM to nasal pillow or nasal cradle is possible.   DISCUSSION:A follow up appointment will be scheduled in the Sleep Clinic at [facility].   Please call [phone] with any questions.      I certify that I have reviewed the entire raw data recording prior to the issuance of this report in accordance  with the Standards of Accreditation of the American Academy of Sleep Medicine (AASM)    Larey Seat, M.D. Diplomat, Tax adviser of Psychiatry and Neurology  Diplomat, Tax adviser of Sleep Medicine Market researcher, [employer] [facility] at [employer]

## 2019-09-21 ENCOUNTER — Other Ambulatory Visit: Payer: Self-pay | Admitting: *Deleted

## 2019-09-21 DIAGNOSIS — I872 Venous insufficiency (chronic) (peripheral): Secondary | ICD-10-CM

## 2019-09-22 ENCOUNTER — Other Ambulatory Visit: Payer: Self-pay

## 2019-09-22 ENCOUNTER — Telehealth: Payer: Self-pay | Admitting: Neurology

## 2019-09-22 ENCOUNTER — Ambulatory Visit
Admission: RE | Admit: 2019-09-22 | Discharge: 2019-09-22 | Disposition: A | Discharge status: Home / Self Care | Payer: BC Managed Care – PPO | Source: Ambulatory Visit | Attending: Surgery | Admitting: Surgery

## 2019-09-22 DIAGNOSIS — D0511 Intraductal carcinoma in situ of right breast: Secondary | ICD-10-CM | POA: Diagnosis not present

## 2019-09-22 DIAGNOSIS — C50911 Malignant neoplasm of unspecified site of right female breast: Secondary | ICD-10-CM

## 2019-09-22 IMAGING — MG MM PLC BREAST LOC DEV 1ST LESION INC*R*
8 series · 8 of 8 positions shown · non-contrast
Comparison: Previous exam(s).

CLINICAL DATA: Pre lumpectomy localization of recently diagnosed
right breast DCIS involving a papillary lesion.

EXAM:
MAMMOGRAPHIC GUIDED RADIOACTIVE SEED LOCALIZATION OF THE RIGHT
BREAST

[R ML (1 of 5)]
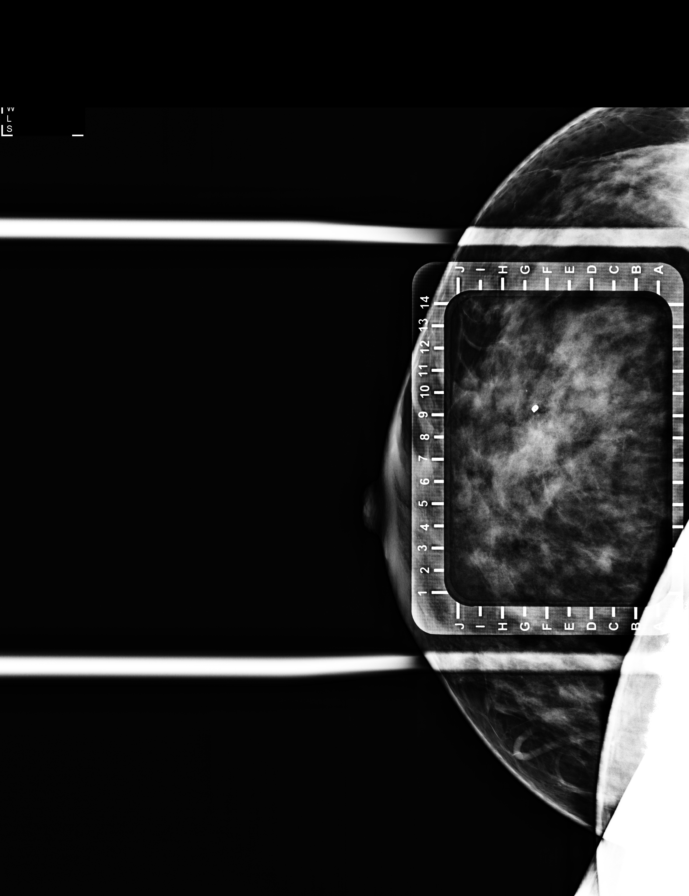

[R ML (2 of 5)]
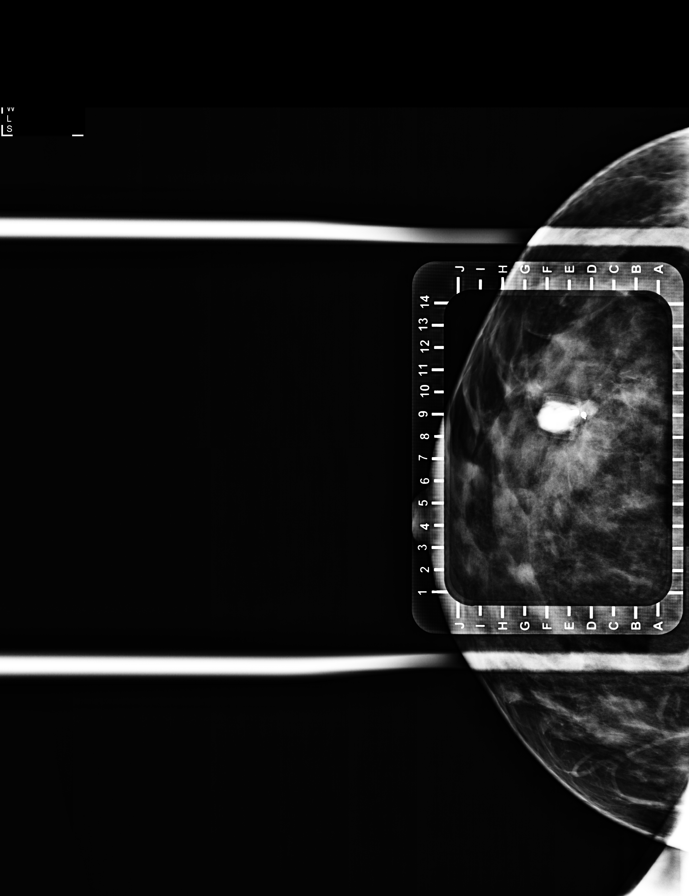

[R ML (3 of 5)]
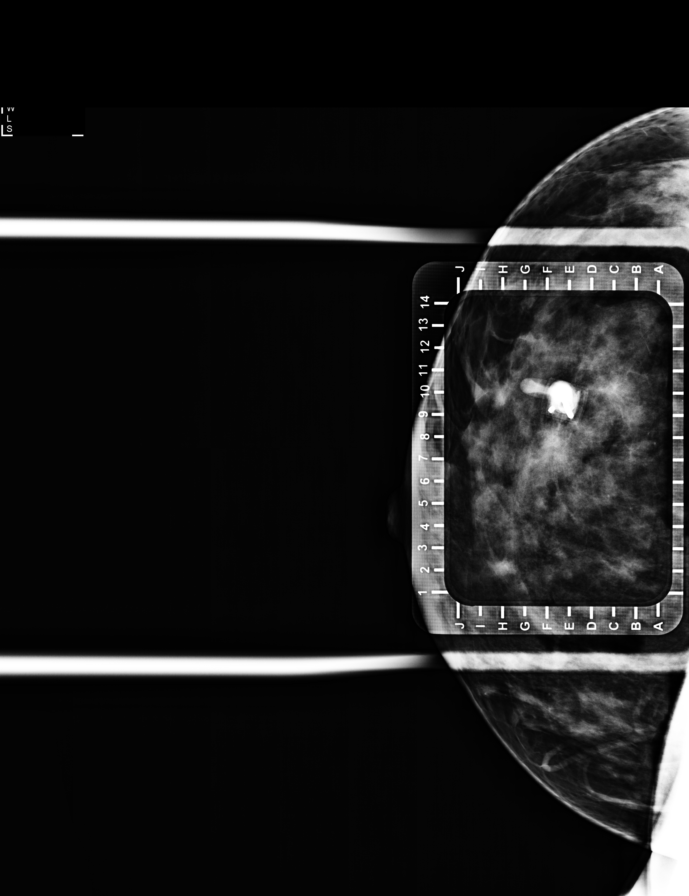

[R ML (4 of 5)]
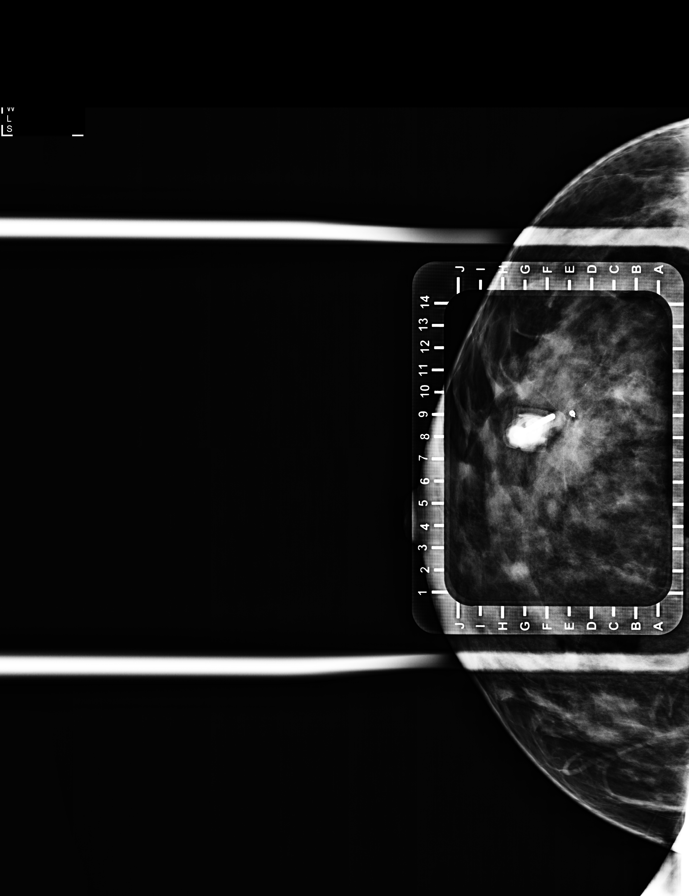

[R ML (5 of 5)]
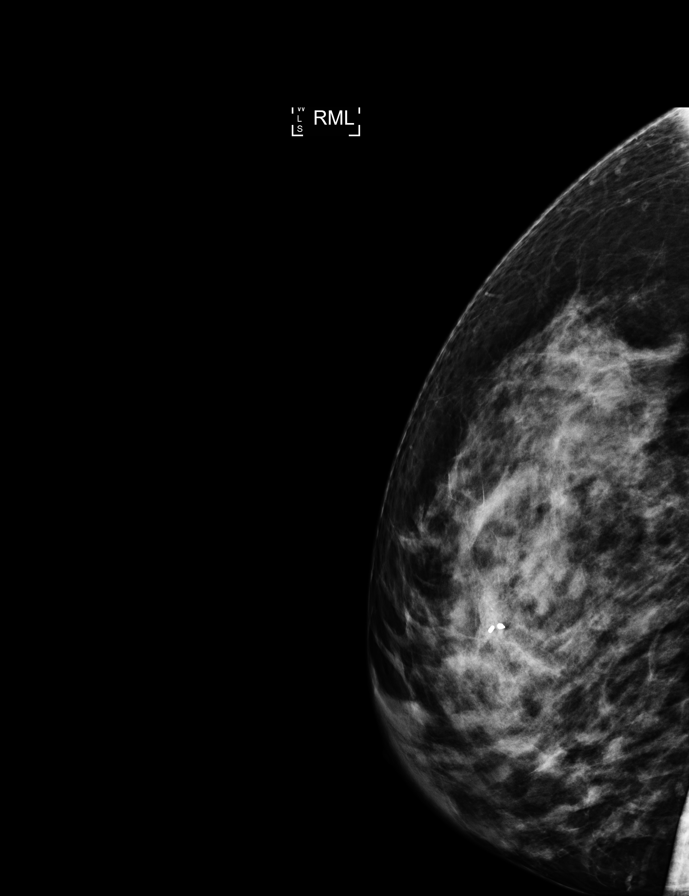

[R CC (1 of 3)]
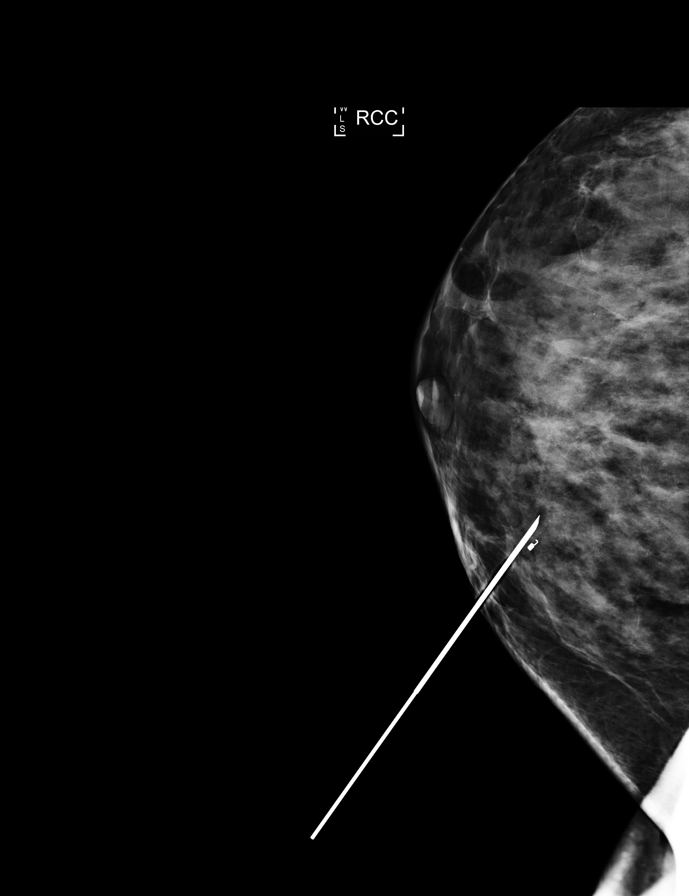

[R CC (2 of 3)]
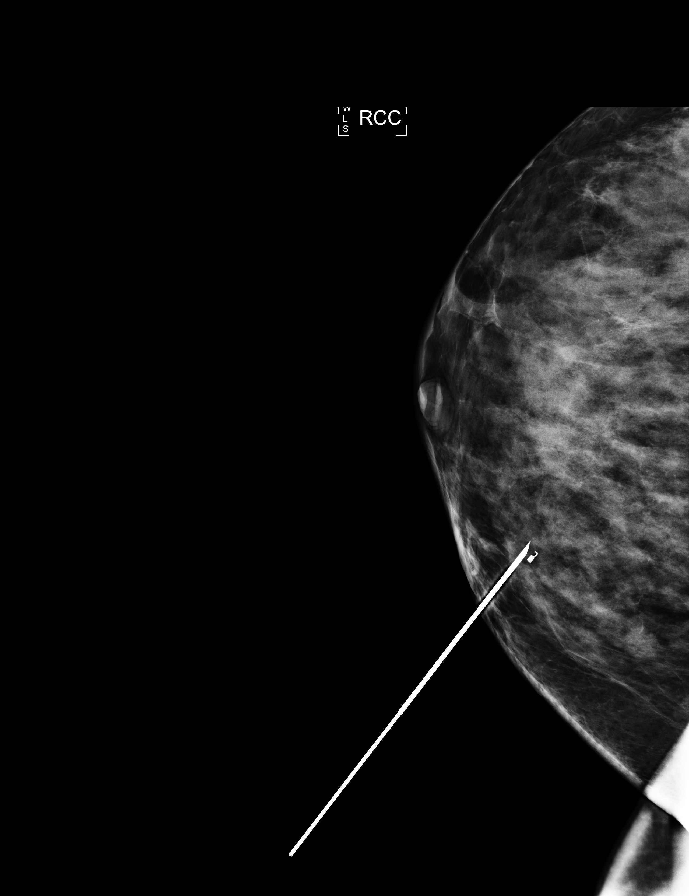

[R CC (3 of 3)]
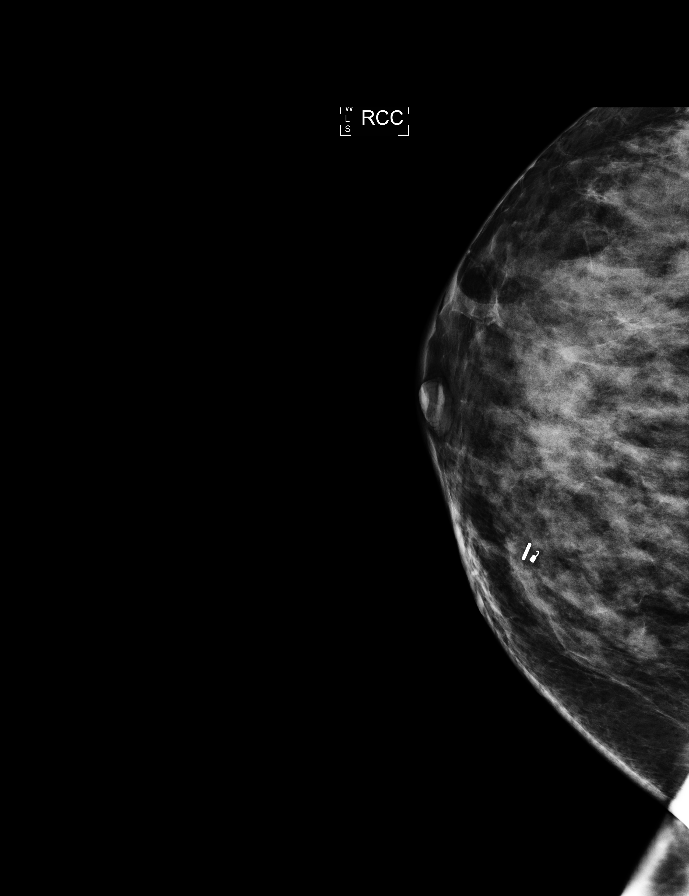

[8 of 8 positions shown; findings below may reference images not displayed]

FINDINGS: Patient presents for radioactive seed localization prior to right
lumpectomy. I met with the patient and we discussed the procedure of
seed localization including benefits and alternatives. We discussed
the high likelihood of a successful procedure. We discussed the
risks of the procedure including infection, bleeding, tissue injury
and further surgery. We discussed the low dose of radioactivity
involved in the procedure. Informed, written consent was given.

The usual time-out protocol was performed immediately prior to the
procedure.

Using mammographic guidance, sterile technique, 1% lidocaine and an
[4E] radioactive seed, the recently placed coil shaped biopsy
marker clip in the upper inner right breast was localized using a
medial approach. The follow-up mammogram images confirm the seed in
the expected location and were marked for Dr. EOY.

Follow-up survey of the patient confirms presence of the radioactive
seed.

Order number of [4E] seed:  [PHONE_NUMBER].

Total activity:  0.252 mCi reference Date: [DATE]

The patient tolerated the procedure well and was released from the
[REDACTED]. She was given instructions regarding seed removal.
IMPRESSION: Radioactive seed localization right breast. No apparent
complications.

## 2019-09-22 NOTE — Telephone Encounter (Signed)
I called pt. I advised pt that Dr. Brett Fairy reviewed their sleep study results and found that pt was best treated with CPAP at a pressure of 9 cm water pressure. Dr. Brett Fairy recommends that pt starts auto CPAP 5-12 cm water pressure. I reviewed PAP compliance expectations with the pt. Pt is agreeable to starting a CPAP. I advised pt that an order will be sent to a DME, [facility] ([facility] and [facility] ([place]) will call the pt within about one week after they file with the pt's insurance. [facility]) will show the pt how to use the machine, fit for masks, and troubleshoot the CPAP if needed. A follow up appt was made for insurance purposes with Ward Givens, NP on Nov 4,2021 at 11:30 am. Pt verbalized understanding to arrive 15 minutes early and bring their CPAP. A letter with all of this information in it will be mailed to the pt as a reminder. I verified with the pt that the address we have on file is correct. Pt verbalized understanding of results. Pt had no questions at this time but was encouraged to call back if questions arise. I have sent the order to [facility]) and have received confirmation that they have received the order.

## 2019-09-22 NOTE — Telephone Encounter (Signed)
-----   Message from Larey Seat, MD sent at 09/20/2019  1:31 PM EDT ----- DIAGNOSIS  1. Mild complex sleep apnea responded to low pressure CPAP  therapy. I am not sure if the patient liked the Simplus FFM in  small size or not-     2. PLANS/RECOMMENDATIONS: Start CPAP 5-12 cm water with 1 cm EPR  and mask of choice. A change from FFM to nasal pillow or nasal  cradle is possible.   DISCUSSION:A follow up appointment will be scheduled in the  Sleep Clinic at [facility].  Please call  [phone] with any questions.

## 2019-09-23 ENCOUNTER — Ambulatory Visit (HOSPITAL_COMMUNITY): Payer: BC Managed Care – PPO | Admitting: Anesthesiology

## 2019-09-23 ENCOUNTER — Ambulatory Visit (HOSPITAL_COMMUNITY)
Admission: RE | Admit: 2019-09-23 | Discharge: 2019-09-23 | Disposition: A | Discharge status: Home / Self Care | Payer: BC Managed Care – PPO | Attending: Surgery | Admitting: Surgery

## 2019-09-23 ENCOUNTER — Ambulatory Visit
Admission: RE | Admit: 2019-09-23 | Discharge: 2019-09-23 | Disposition: A | Discharge status: Home / Self Care | Payer: BC Managed Care – PPO | Source: Ambulatory Visit | Attending: Surgery | Admitting: Surgery

## 2019-09-23 ENCOUNTER — Encounter (HOSPITAL_COMMUNITY): Payer: Self-pay | Admitting: Surgery

## 2019-09-23 ENCOUNTER — Ambulatory Visit (HOSPITAL_COMMUNITY): Payer: BC Managed Care – PPO

## 2019-09-23 ENCOUNTER — Encounter (HOSPITAL_COMMUNITY)
Admission: RE | Admit: 2019-09-23 | Discharge: 2019-09-23 | Disposition: A | Discharge status: Home / Self Care | Payer: BC Managed Care – PPO | Source: Home / Self Care | Attending: Surgery | Admitting: Surgery

## 2019-09-23 ENCOUNTER — Encounter (HOSPITAL_COMMUNITY)
Admission: RE | Disposition: A | Discharge status: Home / Self Care | Payer: Self-pay | Source: Home / Self Care | Attending: Surgery

## 2019-09-23 DIAGNOSIS — D0511 Intraductal carcinoma in situ of right breast: Secondary | ICD-10-CM | POA: Diagnosis not present

## 2019-09-23 DIAGNOSIS — G8918 Other acute postprocedural pain: Secondary | ICD-10-CM | POA: Diagnosis not present

## 2019-09-23 DIAGNOSIS — Z170 Estrogen receptor positive status [ER+]: Secondary | ICD-10-CM | POA: Diagnosis not present

## 2019-09-23 DIAGNOSIS — G4730 Sleep apnea, unspecified: Secondary | ICD-10-CM | POA: Diagnosis not present

## 2019-09-23 DIAGNOSIS — Z833 Family history of diabetes mellitus: Secondary | ICD-10-CM | POA: Insufficient documentation

## 2019-09-23 DIAGNOSIS — D241 Benign neoplasm of right breast: Secondary | ICD-10-CM | POA: Insufficient documentation

## 2019-09-23 DIAGNOSIS — C50911 Malignant neoplasm of unspecified site of right female breast: Secondary | ICD-10-CM

## 2019-09-23 DIAGNOSIS — E119 Type 2 diabetes mellitus without complications: Secondary | ICD-10-CM | POA: Insufficient documentation

## 2019-09-23 DIAGNOSIS — Z1501 Genetic susceptibility to malignant neoplasm of breast: Secondary | ICD-10-CM | POA: Diagnosis not present

## 2019-09-23 DIAGNOSIS — R928 Other abnormal and inconclusive findings on diagnostic imaging of breast: Secondary | ICD-10-CM | POA: Diagnosis not present

## 2019-09-23 DIAGNOSIS — Z7984 Long term (current) use of oral hypoglycemic drugs: Secondary | ICD-10-CM | POA: Diagnosis not present

## 2019-09-23 DIAGNOSIS — G4700 Insomnia, unspecified: Secondary | ICD-10-CM | POA: Diagnosis not present

## 2019-09-23 HISTORY — PX: BREAST LUMPECTOMY WITH RADIOACTIVE SEED AND SENTINEL LYMPH NODE BIOPSY: SHX6550

## 2019-09-23 HISTORY — PX: BREAST LUMPECTOMY: SHX2

## 2019-09-23 LAB — GLUCOSE, CAPILLARY
Glucose-Capillary: 138 mg/dL — ABNORMAL HIGH (ref 70–99)
Glucose-Capillary: 100 mg/dL — ABNORMAL HIGH (ref 70–99)

## 2019-09-23 IMAGING — MG MM BREAST SURGICAL SPECIMEN
1 series · 1 of 1 positions shown · non-contrast
Comparison: Previous exam(s).

CLINICAL DATA: Specimen radiograph status post right breast
lumpectomy.

EXAM:
SPECIMEN RADIOGRAPH OF THE RIGHT BREAST

[R]
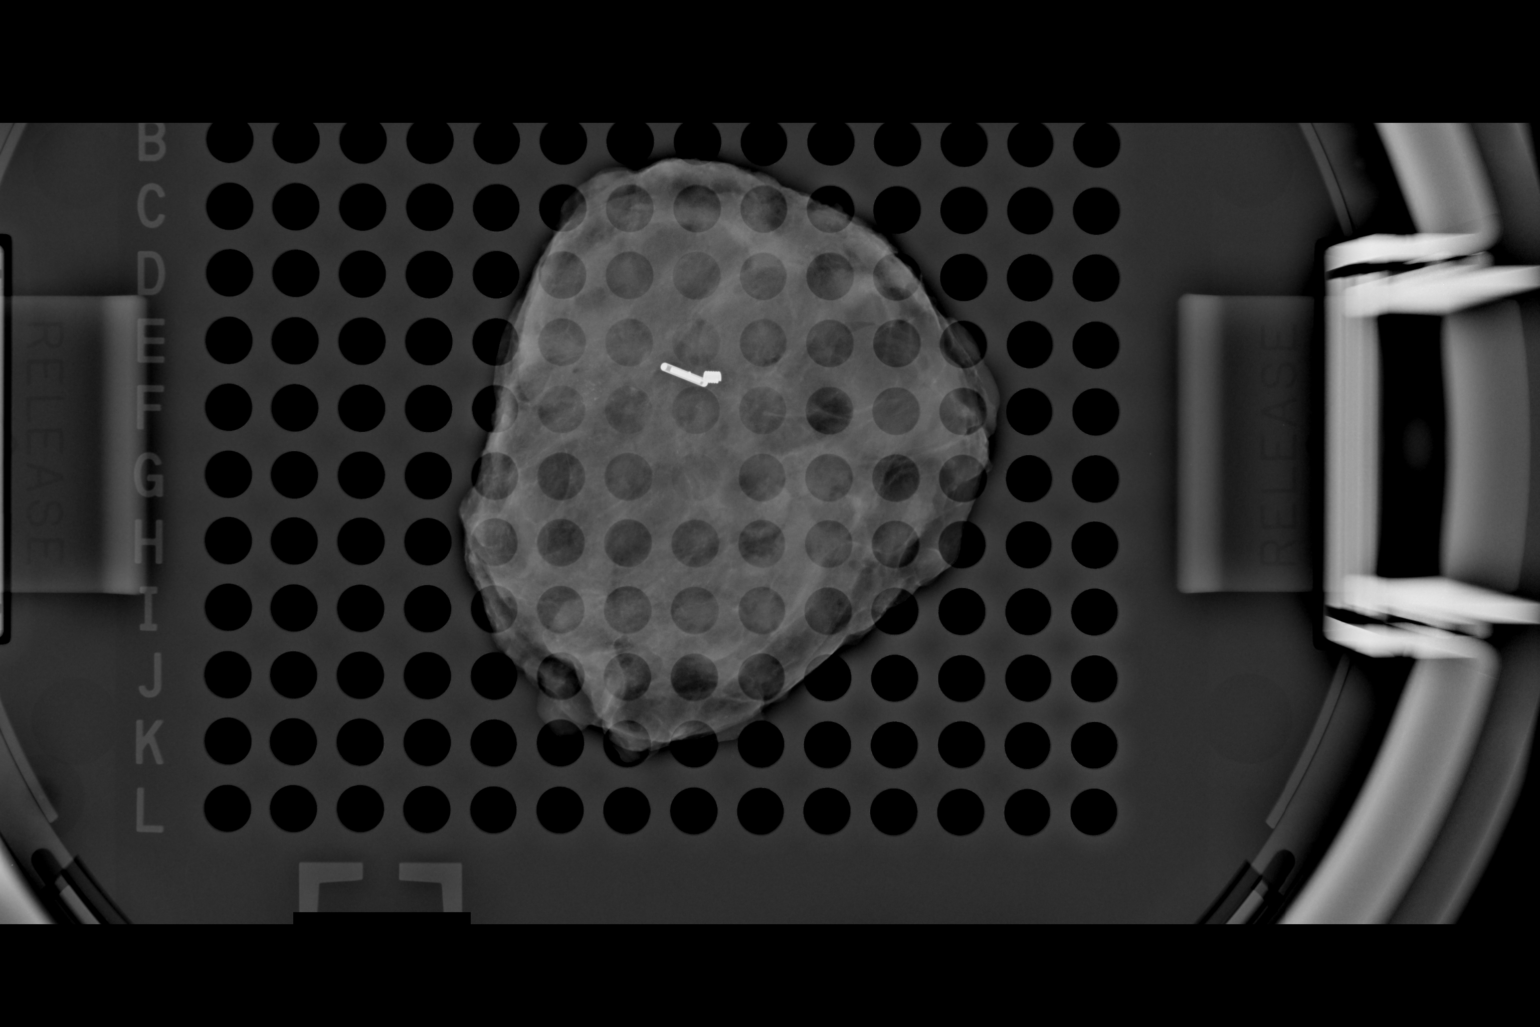

[1 of 1 positions shown; findings below may reference images not displayed]

FINDINGS: Status post excision of the right breast. The radioactive seed and
biopsy marker clip are present, completely intact, and were marked
for pathology. These findings were communicated to the OR at the
time of surgery.
IMPRESSION: Specimen radiograph of the right breast.

## 2019-09-23 SURGERY — BREAST LUMPECTOMY WITH RADIOACTIVE SEED AND SENTINEL LYMPH NODE BIOPSY
Anesthesia: General | Site: Breast | Laterality: Right

## 2019-09-23 MED ORDER — FENTANYL CITRATE (PF) 250 MCG/5ML IJ SOLN
INTRAMUSCULAR | Status: DC | PRN
Start: 2019-09-23 — End: 2019-09-23
  Administered 2019-09-23: 50 ug via INTRAVENOUS

## 2019-09-23 MED ORDER — DEXAMETHASONE SODIUM PHOSPHATE 10 MG/ML IJ SOLN
INTRAMUSCULAR | Status: AC
  Filled 2019-09-23: qty 1

## 2019-09-23 MED ORDER — CHLORHEXIDINE GLUCONATE CLOTH 2 % EX PADS: 6.0000 | MEDICATED_PAD | Freq: Once | CUTANEOUS | Status: DC

## 2019-09-23 MED ORDER — MIDAZOLAM HCL 2 MG/2ML IJ SOLN
INTRAMUSCULAR | Status: AC
  Administered 2019-09-23: 2 mg via INTRAVENOUS
  Filled 2019-09-23: qty 2

## 2019-09-23 MED ORDER — BUPIVACAINE-EPINEPHRINE 0.25% -1:200000 IJ SOLN
INTRAMUSCULAR | Status: DC | PRN
  Administered 2019-09-23: 20 mL

## 2019-09-23 MED ORDER — FENTANYL CITRATE (PF) 100 MCG/2ML IJ SOLN: 25.0000 ug | INTRAMUSCULAR | Status: DC | PRN

## 2019-09-23 MED ORDER — ONDANSETRON HCL 4 MG/2ML IJ SOLN
INTRAMUSCULAR | Status: AC
  Filled 2019-09-23: qty 2

## 2019-09-23 MED ORDER — PROMETHAZINE HCL 25 MG/ML IJ SOLN: 6.2500 mg | INTRAMUSCULAR | Status: DC | PRN

## 2019-09-23 MED ORDER — CEFAZOLIN SODIUM-DEXTROSE 2-4 GM/100ML-% IV SOLN
2.0000 g | INTRAVENOUS | Status: AC
  Administered 2019-09-23: 2 g via INTRAVENOUS
  Filled 2019-09-23: qty 100

## 2019-09-23 MED ORDER — ONDANSETRON HCL 4 MG/2ML IJ SOLN
INTRAMUSCULAR | Status: DC | PRN
  Administered 2019-09-23: 4 mg via INTRAVENOUS

## 2019-09-23 MED ORDER — BUPIVACAINE HCL (PF) 0.5 % IJ SOLN
INTRAMUSCULAR | Status: DC | PRN
  Administered 2019-09-23: 15 mL

## 2019-09-23 MED ORDER — MIDAZOLAM HCL 5 MG/5ML IJ SOLN
INTRAMUSCULAR | Status: DC | PRN
  Administered 2019-09-23: 2 mg via INTRAVENOUS

## 2019-09-23 MED ORDER — PHENYLEPHRINE 40 MCG/ML (10ML) SYRINGE FOR IV PUSH (FOR BLOOD PRESSURE SUPPORT)
PREFILLED_SYRINGE | INTRAVENOUS | Status: AC
  Filled 2019-09-23: qty 10

## 2019-09-23 MED ORDER — CHLORHEXIDINE GLUCONATE 0.12 % MT SOLN
15.0000 mL | Freq: Once | OROMUCOSAL | Status: AC
  Administered 2019-09-23: 15 mL via OROMUCOSAL
  Filled 2019-09-23: qty 15

## 2019-09-23 MED ORDER — FENTANYL CITRATE (PF) 100 MCG/2ML IJ SOLN
INTRAMUSCULAR | Status: AC
Start: 2019-09-23 — End: 2019-09-23
  Administered 2019-09-23: 50 ug via INTRAVENOUS
  Filled 2019-09-23: qty 2

## 2019-09-23 MED ORDER — PHENYLEPHRINE 40 MCG/ML (10ML) SYRINGE FOR IV PUSH (FOR BLOOD PRESSURE SUPPORT)
PREFILLED_SYRINGE | INTRAVENOUS | Status: DC | PRN
  Administered 2019-09-23: 40 ug via INTRAVENOUS
  Administered 2019-09-23: 120 ug via INTRAVENOUS
  Administered 2019-09-23: 160 ug via INTRAVENOUS
  Administered 2019-09-23: 80 ug via INTRAVENOUS
  Administered 2019-09-23: 120 ug via INTRAVENOUS
  Administered 2019-09-23: 80 ug via INTRAVENOUS

## 2019-09-23 MED ORDER — OXYCODONE HCL 5 MG PO TABS: 5.0000 mg | ORAL_TABLET | Freq: Four times a day (QID) | ORAL | 0 refills | Status: DC | PRN

## 2019-09-23 MED ORDER — ACETAMINOPHEN 500 MG PO TABS
1000.0000 mg | ORAL_TABLET | ORAL | Status: AC
  Administered 2019-09-23: 1000 mg via ORAL
  Filled 2019-09-23: qty 2

## 2019-09-23 MED ORDER — MIDAZOLAM HCL 2 MG/2ML IJ SOLN: 2.0000 mg | Freq: Once | INTRAMUSCULAR | Status: AC

## 2019-09-23 MED ORDER — TECHNETIUM TC 99M SULFUR COLLOID FILTERED
1.0000 | Freq: Once | INTRAVENOUS | Status: AC | PRN
  Administered 2019-09-23: 1 via INTRADERMAL

## 2019-09-23 MED ORDER — METHYLENE BLUE 0.5 % INJ SOLN
INTRAVENOUS | Status: AC
  Filled 2019-09-23: qty 10

## 2019-09-23 MED ORDER — BUPIVACAINE LIPOSOME 1.3 % IJ SUSP
INTRAMUSCULAR | Status: DC | PRN
  Administered 2019-09-23: 10 mL

## 2019-09-23 MED ORDER — FENTANYL CITRATE (PF) 250 MCG/5ML IJ SOLN
INTRAMUSCULAR | Status: AC
  Filled 2019-09-23: qty 5

## 2019-09-23 MED ORDER — OXYCODONE HCL 5 MG PO TABS: 5.0000 mg | ORAL_TABLET | Freq: Once | ORAL | Status: DC | PRN

## 2019-09-23 MED ORDER — ORAL CARE MOUTH RINSE: 15.0000 mL | Freq: Once | OROMUCOSAL | Status: AC

## 2019-09-23 MED ORDER — LACTATED RINGERS IV SOLN: INTRAVENOUS | Status: DC

## 2019-09-23 MED ORDER — FENTANYL CITRATE (PF) 100 MCG/2ML IJ SOLN: 50.0000 ug | Freq: Once | INTRAMUSCULAR | Status: AC

## 2019-09-23 MED ORDER — LIDOCAINE 2% (20 MG/ML) 5 ML SYRINGE
INTRAMUSCULAR | Status: DC | PRN
  Administered 2019-09-23: 100 mg via INTRAVENOUS

## 2019-09-23 MED ORDER — BUPIVACAINE HCL (PF) 0.25 % IJ SOLN
INTRAMUSCULAR | Status: AC
  Filled 2019-09-23: qty 30

## 2019-09-23 MED ORDER — MIDAZOLAM HCL 2 MG/2ML IJ SOLN
INTRAMUSCULAR | Status: AC
  Filled 2019-09-23: qty 2

## 2019-09-23 MED ORDER — PROPOFOL 10 MG/ML IV BOLUS
INTRAVENOUS | Status: AC
  Filled 2019-09-23: qty 20

## 2019-09-23 MED ORDER — STERILE WATER FOR IRRIGATION IR SOLN
Status: DC | PRN
  Administered 2019-09-23: 1000 mL

## 2019-09-23 MED ORDER — EPHEDRINE SULFATE-NACL 50-0.9 MG/10ML-% IV SOSY
PREFILLED_SYRINGE | INTRAVENOUS | Status: DC | PRN
  Administered 2019-09-23 (×2): 10 mg via INTRAVENOUS

## 2019-09-23 MED ORDER — OXYCODONE HCL 5 MG/5ML PO SOLN: 5.0000 mg | Freq: Once | ORAL | Status: DC | PRN

## 2019-09-23 MED ORDER — ONDANSETRON HCL 4 MG PO TABS: 4.0000 mg | ORAL_TABLET | Freq: Every day | ORAL | 1 refills | Status: DC | PRN

## 2019-09-23 MED ORDER — 0.9 % SODIUM CHLORIDE (POUR BTL) OPTIME
TOPICAL | Status: DC | PRN
  Administered 2019-09-23: 1000 mL

## 2019-09-23 MED ORDER — IBUPROFEN 800 MG PO TABS: 800.0000 mg | ORAL_TABLET | Freq: Three times a day (TID) | ORAL | 0 refills | Status: DC | PRN

## 2019-09-23 MED ORDER — LIDOCAINE 2% (20 MG/ML) 5 ML SYRINGE
INTRAMUSCULAR | Status: AC
  Filled 2019-09-23: qty 5

## 2019-09-23 MED ORDER — DEXAMETHASONE SODIUM PHOSPHATE 10 MG/ML IJ SOLN
INTRAMUSCULAR | Status: DC | PRN
  Administered 2019-09-23: 8 mg via INTRAVENOUS

## 2019-09-23 MED ORDER — PROPOFOL 10 MG/ML IV BOLUS
INTRAVENOUS | Status: DC | PRN
  Administered 2019-09-23: 200 mg via INTRAVENOUS

## 2019-09-23 SURGICAL SUPPLY — 47 items
ADH SKN CLS APL DERMABOND .7 (GAUZE/BANDAGES/DRESSINGS) ×1
APL PRP STRL LF DISP 70% ISPRP (MISCELLANEOUS) ×1
APPLIER CLIP 9.375 MED OPEN (MISCELLANEOUS) ×2
APR CLP MED 9.3 20 MLT OPN (MISCELLANEOUS) ×1
BINDER BREAST LRG (GAUZE/BANDAGES/DRESSINGS) IMPLANT
BINDER BREAST XLRG (GAUZE/BANDAGES/DRESSINGS) ×1 IMPLANT
CANISTER SUCT 3000ML PPV (MISCELLANEOUS) ×2 IMPLANT
CHLORAPREP W/TINT 26 (MISCELLANEOUS) ×2 IMPLANT
CLIP APPLIE 9.375 MED OPEN (MISCELLANEOUS) ×1 IMPLANT
CNTNR URN SCR LID CUP LEK RST (MISCELLANEOUS) ×1 IMPLANT
CONT SPEC 4OZ STRL OR WHT (MISCELLANEOUS) ×2
COVER PROBE W GEL 5X96 (DRAPES) ×2 IMPLANT
COVER SURGICAL LIGHT HANDLE (MISCELLANEOUS) ×2 IMPLANT
COVER WAND RF STERILE (DRAPES) IMPLANT
DERMABOND ADVANCED (GAUZE/BANDAGES/DRESSINGS) ×1
DERMABOND ADVANCED .7 DNX12 (GAUZE/BANDAGES/DRESSINGS) ×1 IMPLANT
DEVICE DUBIN SPECIMEN MAMMOGRA (MISCELLANEOUS) ×2 IMPLANT
DRAPE CHEST BREAST 15X10 FENES (DRAPES) ×2 IMPLANT
ELECT CAUTERY BLADE 6.4 (BLADE) ×2 IMPLANT
ELECT REM PT RETURN 9FT ADLT (ELECTROSURGICAL) ×2
ELECTRODE REM PT RTRN 9FT ADLT (ELECTROSURGICAL) ×1 IMPLANT
GLOVE BIO SURGEON STRL SZ8 (GLOVE) ×2 IMPLANT
GLOVE BIOGEL PI IND STRL 8 (GLOVE) ×1 IMPLANT
GLOVE BIOGEL PI INDICATOR 8 (GLOVE) ×1
GOWN STRL REUS W/ TWL LRG LVL3 (GOWN DISPOSABLE) ×1 IMPLANT
GOWN STRL REUS W/ TWL XL LVL3 (GOWN DISPOSABLE) ×1 IMPLANT
GOWN STRL REUS W/TWL LRG LVL3 (GOWN DISPOSABLE) ×2
GOWN STRL REUS W/TWL XL LVL3 (GOWN DISPOSABLE) ×2
KIT BASIN OR (CUSTOM PROCEDURE TRAY) ×2 IMPLANT
KIT MARKER MARGIN INK (KITS) ×2 IMPLANT
LIGHT WAVEGUIDE WIDE FLAT (MISCELLANEOUS) IMPLANT
NDL 18GX1X1/2 (RX/OR ONLY) (NEEDLE) IMPLANT
NDL FILTER BLUNT 18X1 1/2 (NEEDLE) IMPLANT
NDL HYPO 25GX1X1/2 BEV (NEEDLE) ×1 IMPLANT
NEEDLE 18GX1X1/2 (RX/OR ONLY) (NEEDLE) IMPLANT
NEEDLE FILTER BLUNT 18X 1/2SAF (NEEDLE)
NEEDLE FILTER BLUNT 18X1 1/2 (NEEDLE) IMPLANT
NEEDLE HYPO 25GX1X1/2 BEV (NEEDLE) ×2 IMPLANT
NS IRRIG 1000ML POUR BTL (IV SOLUTION) ×2 IMPLANT
PACK GENERAL/GYN (CUSTOM PROCEDURE TRAY) ×2 IMPLANT
PAD ABD 8X10 STRL (GAUZE/BANDAGES/DRESSINGS) ×1 IMPLANT
PENCIL SMOKE EVACUATOR (MISCELLANEOUS) ×1 IMPLANT
SUT MNCRL AB 4-0 PS2 18 (SUTURE) ×2 IMPLANT
SUT VIC AB 3-0 SH 18 (SUTURE) ×2 IMPLANT
SYR CONTROL 10ML LL (SYRINGE) ×2 IMPLANT
TOWEL GREEN STERILE (TOWEL DISPOSABLE) ×2 IMPLANT
TOWEL GREEN STERILE FF (TOWEL DISPOSABLE) ×2 IMPLANT

## 2019-09-23 NOTE — Anesthesia Procedure Notes (Signed)
Anesthesia Regional Block: Pectoralis block   Pre-Anesthetic Checklist: ,, timeout performed, Correct Patient, Correct Site, Correct Laterality, Correct Procedure, Correct Position, site marked, Risks and benefits discussed,  Surgical consent,  Pre-op evaluation,  At surgeon's request and post-op pain management  Laterality: Right  Prep: chloraprep       Needles:  Injection technique: Single-shot  Needle Type: Echogenic Needle     Needle Length: 10cm  Needle Gauge: 21     Additional Needles:   Narrative:  Start time: 09/23/2019 8:40 AM End time: 09/23/2019 8:45 AM Injection made incrementally with aspirations every 5 mL.  Performed by: Personally  Anesthesiologist: Audry Pili, MD  Additional Notes: No pain on injection. No increased resistance to injection. Injection made in 5cc increments. Good needle visualization. Patient tolerated the procedure well.

## 2019-09-23 NOTE — Transfer of Care (Signed)
Immediate Anesthesia Transfer of Care Note  Patient: Vanessa Allen  Procedure(s) Performed: RIGHT BREAST LUMPECTOMY WITH RADIOACTIVE SEED AND SENTINEL LYMPH NODE MAPPING (Right Breast)  Patient Location: PACU  Anesthesia Type:General and Regional  Level of Consciousness: drowsy and responds to stimulation  Airway & Oxygen Therapy: Patient Spontanous Breathing and Patient connected to nasal cannula oxygen, OPA  Post-op Assessment: Report given to RN and Post -op Vital signs reviewed and stable  Post vital signs: Reviewed and stable  Last Vitals:  Vitals Value Taken Time  BP 93/62 09/23/19 1013  Temp    Pulse 80 09/23/19 1015  Resp 14 09/23/19 1015  SpO2 98 % 09/23/19 1015  Vitals shown include unvalidated device data.  Last Pain:  Vitals:   09/23/19 0744  TempSrc:   PainSc: 0-No pain         Complications: No complications documented.

## 2019-09-23 NOTE — Interval H&P Note (Signed)
History and Physical Interval Note:  09/23/2019 8:42 AM  Vanessa Allen  has presented today for surgery, with the diagnosis of RIGHT BREAST DUCTAL CARCINOMA IN SITU.  The various methods of treatment have been discussed with the patient and family. After consideration of risks, benefits and other options for treatment, the patient has consented to  Procedure(s) with comments: RIGHT BREAST LUMPECTOMY WITH RADIOACTIVE SEED AND SENTINEL LYMPH NODE MAPPING (Right) - PEC BLOCK as a surgical intervention.  The patient's history has been reviewed, patient examined, no change in status, stable for surgery.  I have reviewed the patient's chart and labs.  Questions were answered to the patient's satisfaction.     Turner Daniels MD

## 2019-09-23 NOTE — Anesthesia Postprocedure Evaluation (Signed)
Anesthesia Post Note  Patient: Vanessa Allen  Procedure(s) Performed: RIGHT BREAST LUMPECTOMY WITH RADIOACTIVE SEED AND SENTINEL LYMPH NODE MAPPING (Right Breast)     Patient location during evaluation: PACU Anesthesia Type: General Level of consciousness: awake and alert Pain management: pain level controlled Vital Signs Assessment: post-procedure vital signs reviewed and stable Respiratory status: spontaneous breathing, nonlabored ventilation and respiratory function stable Cardiovascular status: blood pressure returned to baseline and stable Postop Assessment: no apparent nausea or vomiting Anesthetic complications: no   No complications documented.  Last Vitals:  Vitals:   09/23/19 1058 09/23/19 1059  BP: 120/86   Pulse: 81 79  Resp: (!) 25 19  Temp:  (!) 36.2 C  SpO2: 94% 100%    Last Pain:  Vitals:   09/23/19 1015  TempSrc:   PainSc: [place]

## 2019-09-23 NOTE — Anesthesia Procedure Notes (Signed)
Procedure Name: LMA Insertion Date/Time: 09/23/2019 9:15 AM Performed by: Verdie Drown, CRNA Pre-anesthesia Checklist: Patient identified, Emergency Drugs available, Suction available and Patient being monitored Patient Re-evaluated:Patient Re-evaluated prior to induction Oxygen Delivery Method: Circle System Utilized Preoxygenation: Pre-oxygenation with 100% oxygen Induction Type: IV induction Ventilation: Mask ventilation without difficulty LMA: LMA inserted LMA Size: 4.0 Number of attempts: 1 Airway Equipment and Method: Bite block Placement Confirmation: positive ETCO2 Tube secured with: Tape Dental Injury: Teeth and Oropharynx as per pre-operative assessment

## 2019-09-23 NOTE — Anesthesia Preprocedure Evaluation (Addendum)
Anesthesia Evaluation  Patient identified by MRN, date of birth, ID band Patient awake    Reviewed: Allergy & Precautions, NPO status , Patient's Chart, lab work & pertinent test results  History of Anesthesia Complications Negative for: history of anesthetic complications  Airway Mallampati: II  TM Distance: >3 FB Neck ROM: Full    Dental  (+) Dental Advisory Given, Teeth Intact   Pulmonary sleep apnea (central, recent diagnosis, not yet on CPAP) ,    Pulmonary exam normal        Cardiovascular negative cardio ROS Normal cardiovascular exam     Neuro/Psych PSYCHIATRIC DISORDERS Anxiety negative neurological ROS     GI/Hepatic Neg liver ROS, GERD  Medicated and Controlled,  Endo/Other  diabetes, Type 2, Oral Hypoglycemic Agents  Renal/GU negative Renal ROS     Musculoskeletal negative musculoskeletal ROS (+)   Abdominal   Peds  Hematology negative hematology ROS (+)   Anesthesia Other Findings Covid test negative   Reproductive/Obstetrics                            Anesthesia Physical Anesthesia Plan  ASA: III  Anesthesia Plan: General   Post-op Pain Management:  Regional for Post-op pain   Induction: Intravenous  PONV Risk Score and Plan: 3 and Treatment may vary due to age or medical condition, Ondansetron, Dexamethasone and Midazolam  Airway Management Planned: LMA  Additional Equipment: None  Intra-op Plan:   Post-operative Plan: Extubation in OR  Informed Consent: I have reviewed the patients History and Physical, chart, labs and discussed the procedure including the risks, benefits and alternatives for the proposed anesthesia with the patient or authorized representative who has indicated his/her understanding and acceptance.     Dental advisory given  Plan Discussed with: CRNA and Anesthesiologist  Anesthesia Plan Comments:        Anesthesia Quick  Evaluation

## 2019-09-23 NOTE — Discharge Instructions (Signed)
[facility] °Office Phone Number [phone] ° °BREAST BIOPSY/ PARTIAL MASTECTOMY: POST OP INSTRUCTIONS ° °Always review your discharge instruction sheet given to you by the facility where your surgery was performed. ° °IF YOU HAVE DISABILITY OR FAMILY LEAVE FORMS, YOU MUST BRING THEM TO THE OFFICE FOR PROCESSING.  DO NOT GIVE THEM TO YOUR DOCTOR. ° °1. A prescription for pain medication may be given to you upon discharge.  Take your pain medication as prescribed, if needed.  If narcotic pain medicine is not needed, then you may take acetaminophen (Tylenol) or ibuprofen (Advil) as needed. °2. Take your usually prescribed medications unless otherwise directed °3. If you need a refill on your pain medication, please contact your pharmacy.  They will contact our office to request authorization.  Prescriptions will not be filled after 5pm or on week-ends. °4. You should eat very light the first 24 hours after surgery, such as soup, crackers, pudding, etc.  Resume your normal diet the day after surgery. °5. Most patients will experience some swelling and bruising in the breast.  Ice packs and a good support bra will help.  Swelling and bruising can take several days to resolve.  °6. It is common to experience some constipation if taking pain medication after surgery.  Increasing fluid intake and taking a stool softener will usually help or prevent this problem from occurring.  A mild laxative (Milk of Magnesia or Miralax) should be taken according to package directions if there are no bowel movements after 48 hours. °7. Unless discharge instructions indicate otherwise, you may remove your bandages 24-48 hours after surgery, and you may shower at that time.  You may have steri-strips (small skin tapes) in place directly over the incision.  These strips should be left on the skin for 7-10 days.  If your surgeon used skin glue on the incision, you may shower in 24 hours.  The glue will flake off over the  next 2-3 weeks.  Any sutures or staples will be removed at the office during your follow-up visit. °8. ACTIVITIES:  You may resume regular daily activities (gradually increasing) beginning the next day.  Wearing a good support bra or sports bra minimizes pain and swelling.  You may have sexual intercourse when it is comfortable. °a. You may drive when you no longer are taking prescription pain medication, you can comfortably wear a seatbelt, and you can safely maneuver your car and apply brakes. °b. RETURN TO WORK:  ______________________________________________________________________________________ °9. You should see your doctor in the office for a follow-up appointment approximately two weeks after your surgery.  Your doctor’s nurse will typically make your follow-up appointment when she calls you with your pathology report.  Expect your pathology report 2-3 business days after your surgery.  You may call to check if you do not hear from us after three days. °10. OTHER INSTRUCTIONS: _______________________________________________________________________________________________ _____________________________________________________________________________________________________________________________________ °_____________________________________________________________________________________________________________________________________ °_____________________________________________________________________________________________________________________________________ ° °WHEN TO CALL YOUR DOCTOR: °1. Fever over 101.0 °2. Nausea and/or vomiting. °3. Extreme swelling or bruising. °4. Continued bleeding from incision. °5. Increased pain, redness, or drainage from the incision. ° °The clinic staff is available to answer your questions during regular business hours.  Please don’t hesitate to call and ask to speak to one of the nurses for clinical concerns.  If you have a medical emergency, go to the nearest  emergency room or call 911.  A surgeon from [facility] is always on call at the hospital. ° °For further questions, please visit [email or web address]  °

## 2019-09-23 NOTE — Op Note (Addendum)
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 Possible microinvasion noted on core biopsy.  Genetic work-up revealed her to be BRCA2 positive.  We discussed the role of mastectomy as well as prophylactic mastectomy in the circumstance as the preferred management option.  She had no interest in mastectomy at this point and desired breast conserving surgery for now understanding the pros and cons of this approach and the fact that given her young age she is at a significantly increased risk of developing a new breast cancer in either breast in her lifetime.  After lengthy discussion of these results she was still very adamant about breast conserving surgery understanding the pros and cons of this approach, the need for radiation therapy in the potential impact that has on future care and/or surgery.The procedure has been discussed with the patient. Alternatives to surgery have been discussed with the patient.  Risks of surgery include bleeding,  Infection,  Seroma formation, death,  and the need for further surgery.   The patient understands and wishes to proceed.Sentinel lymph node mapping and dissection has been discussed with the patient.  Risk of bleeding,  Infection,  Seroma formation,  Additional procedures,,  Shoulder weakness ,  Shoulder stiffness,  Nerve and blood vessel injury and reaction to the mapping dyes have been discussed.   Alternatives to surgery have been discussed with the patient.  The patient agrees to proceed.  Description of procedure: The patient was met in the holding area and questions were answered.  Neoprobe used to identify seed in the right breast upper inner quadrant.  She underwent pectoral block and injection of technetium sulfur colloid per radiology protocol.  All questions were answered.  She was taken back to the operating.  She is placed supine upon the OR table.  After induction of general esthesia, the right breast was prepped and draped in sterile fashion timeout performed.  Proper patient, site and procedure were verified.  Neoprobe was used to identify the seed in the right upper inner quadrant.  Transverse incision was made over the signal.  Dissection was carried down and all tissue and the seed and clip were excised a grossly negative margin.  The Faxitron image revealed the seed and clip to be in the specimen.  The cavity was made hemostatic with cautery.  Clips were placed.  The cavity is closed with 3-0 Vicryl for Monocryl.  The the axilla was then examined with the neoprobe and the technetium settings.  Hotspot identified.  4 cm incision made in the right axillary crease.  Dissection was carried down into the level 1 contents and there were 2 hot nodes identified and removed.  Background counts approached baseline.  No other adenopathy noted.  This was made hemostatic.  Wound closed with 3-0 Vicryl for Monocryl.  Dermabond applied.  All counts were then found to be correct.  Breast binder placed.  The patient was awoke extubated taken to recovery in satisfactory condition.

## 2019-09-24 ENCOUNTER — Encounter (HOSPITAL_COMMUNITY): Payer: Self-pay | Admitting: Surgery

## 2019-09-27 LAB — SURGICAL PATHOLOGY

## 2019-10-14 ENCOUNTER — Ambulatory Visit
Admission: RE | Admit: 2019-10-14 | Discharge: 2019-10-14 | Disposition: A | Discharge status: Home / Self Care | Payer: BC Managed Care – PPO | Source: Ambulatory Visit | Attending: Radiation Oncology | Admitting: Radiation Oncology

## 2019-10-14 ENCOUNTER — Encounter: Payer: Self-pay | Admitting: Radiation Oncology

## 2019-10-14 ENCOUNTER — Other Ambulatory Visit: Payer: Self-pay

## 2019-10-14 ENCOUNTER — Telehealth: Payer: Self-pay | Admitting: Radiation Oncology

## 2019-10-14 VITALS — BP 143/92 | HR 79 | Temp 98.2°F | Ht 66.0 in | Wt 184.1 lb

## 2019-10-14 DIAGNOSIS — K219 Gastro-esophageal reflux disease without esophagitis: Secondary | ICD-10-CM | POA: Diagnosis not present

## 2019-10-14 DIAGNOSIS — Z803 Family history of malignant neoplasm of breast: Secondary | ICD-10-CM | POA: Diagnosis not present

## 2019-10-14 DIAGNOSIS — D0511 Intraductal carcinoma in situ of right breast: Secondary | ICD-10-CM | POA: Insufficient documentation

## 2019-10-14 DIAGNOSIS — Z1501 Genetic susceptibility to malignant neoplasm of breast: Secondary | ICD-10-CM | POA: Diagnosis not present

## 2019-10-14 DIAGNOSIS — Z9889 Other specified postprocedural states: Secondary | ICD-10-CM | POA: Diagnosis not present

## 2019-10-14 DIAGNOSIS — Z791 Long term (current) use of non-steroidal anti-inflammatories (NSAID): Secondary | ICD-10-CM | POA: Diagnosis not present

## 2019-10-14 DIAGNOSIS — Z806 Family history of leukemia: Secondary | ICD-10-CM | POA: Diagnosis not present

## 2019-10-14 DIAGNOSIS — Z79899 Other long term (current) drug therapy: Secondary | ICD-10-CM | POA: Diagnosis not present

## 2019-10-14 DIAGNOSIS — Z7984 Long term (current) use of oral hypoglycemic drugs: Secondary | ICD-10-CM | POA: Insufficient documentation

## 2019-10-14 DIAGNOSIS — E119 Type 2 diabetes mellitus without complications: Secondary | ICD-10-CM | POA: Diagnosis not present

## 2019-10-14 DIAGNOSIS — Z7982 Long term (current) use of aspirin: Secondary | ICD-10-CM | POA: Diagnosis not present

## 2019-10-14 DIAGNOSIS — Z170 Estrogen receptor positive status [ER+]: Secondary | ICD-10-CM | POA: Insufficient documentation

## 2019-10-14 DIAGNOSIS — Z510 Encounter for antineoplastic radiation therapy: Secondary | ICD-10-CM | POA: Diagnosis not present

## 2019-10-14 DIAGNOSIS — Z800 Family history of malignant neoplasm of digestive organs: Secondary | ICD-10-CM | POA: Insufficient documentation

## 2019-10-14 DIAGNOSIS — Z8041 Family history of malignant neoplasm of ovary: Secondary | ICD-10-CM | POA: Diagnosis not present

## 2019-10-14 NOTE — Progress Notes (Addendum)
Radiation Oncology         [phone] ________________________________  Name: Vanessa Allen        MRN: [id number]  Date of Service: 10/14/2019 DOB: 03-18-1965  WU:JWJXB, Doreene Burke, FNP  Magrinat, Virgie Dad, MD     REFERRING PHYSICIAN: Magrinat, Virgie Dad, MD   DIAGNOSIS: The encounter diagnosis was Ductal carcinoma in situ (DCIS) of right breast.   HISTORY OF PRESENT ILLNESS: Vanessa Allen is a 54 y.o. female originally seen in the multidisciplinary breast clinic for a new diagnosis of right breast cancer. The patient was noted to have screening detected calcifications in the right breast and diagnostic imaging measured these spanning 1.9 cm. A biopsy on 08/25/19 revealed an intermediate grade, ER/PR positive DCIS, there was a comment as well as a possible concern for microinvasive disease.  She has subsequently underwent lumpectomy on 09/24/2019, final pathology revealed intermediate grade DCIS involving an intraductal papilloma, no invasive disease was identified.  Her margins were negative for carcinoma and 2 sampled lymph nodes were also negative for disease.  She is seen today to discuss treatment recommendations for adjuvant radiotherapy.  PREVIOUS RADIATION THERAPY: No   PAST MEDICAL HISTORY:  Past Medical History:  Diagnosis Date  . Cancer [facility])    Right Breast Cancer  . Diabetes mellitus without complication ([place])   . Family history of BRCA gene mutation   . Family history of breast cancer   . Family history of leukemia   . Family history of ovarian cancer   . Family history of stomach cancer   . GERD (gastroesophageal reflux disease)        PAST SURGICAL HISTORY: Past Surgical History:  Procedure Laterality Date  . APPENDECTOMY    . BREAST LUMPECTOMY WITH RADIOACTIVE SEED AND SENTINEL LYMPH NODE BIOPSY Right 09/23/2019   Procedure: RIGHT BREAST LUMPECTOMY WITH RADIOACTIVE SEED AND SENTINEL LYMPH NODE MAPPING;  Surgeon: Erroll Luna, MD;  Location: [place];   Service: General;  Laterality: Right;  PEC BLOCK  . CHOLECYSTECTOMY    . Lipoma removal Left    Left Lower Back  . MYOMECTOMY    . WISDOM TOOTH EXTRACTION       FAMILY HISTORY:  Family History  Problem Relation Age of Onset  . Hyperlipidemia Mother   . Hypertension Mother   . Diabetes Father   . Heart disease Father   . Stomach cancer Father 46  . Hypertension Brother   . Hypertension Daughter   . Leukemia Paternal Aunt        dx. >50  . Breast cancer Niece 44  . BRCA 1/2 Niece   . Ovarian cancer Cousin        d. <50 (maternal first cousin)  . Ovarian cancer Cousin        d. <50 (maternal first cousin)  . Ovarian cancer Cousin        d. late 27s (maternal first cousin)     SOCIAL HISTORY:  reports that she has never smoked. She has never used smokeless tobacco. She reports current alcohol use. She reports that she does not use drugs. The patient is single and lives in [place]. 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   ALLERGIES: Patient has no known allergies.   MEDICATIONS:  Current Outpatient Medications  Medication Sig Dispense Refill  . ACCU-CHEK GUIDE test strip USE TWICE DAILY TO CHECK BLOOD SUGAR BEFORE BREAKFAST AND DINNER 100 strip 5  . acidophilus (RISAQUAD) CAPS capsule  Take 1 capsule by mouth daily.    Marland Kitchen ALPRAZolam (XANAX) 0.25 MG tablet Take 1 tablet (0.25 mg total) by mouth 3 (three) times daily as needed for anxiety. 30 tablet 0  . amitriptyline (ELAVIL) 25 MG tablet Take 1 tablet (25 mg total) by mouth at bedtime. 30 tablet 3  . aspirin EC 81 MG tablet Take 81 mg by mouth daily. Swallow whole.    Marland Kitchen atorvastatin (LIPITOR) 10 MG tablet TAKE 1 TABLET(10 MG) BY MOUTH DAILY (Patient taking differently: Take 10 mg by mouth every 7 (seven) days. ) 90 tablet 1  . Biotin w/ Vitamins C & E (HAIR/SKIN/NAILS PO) Take 1 tablet by mouth daily.    . Coenzyme Q10 (CO Q-10) 100 MG CAPS Take by mouth.    . Continuous Blood Gluc Receiver (FREESTYLE  LIBRE 14 DAY READER) DEVI USE TO CHECK BLOOD SUGAR BEFORE MEALS AND AT BEDTIME 1 each 2  . Continuous Blood Gluc Sensor (FREESTYLE LIBRE 14 DAY SENSOR) MISC USE TO MEASURE BLOOD GLUCOSE FOUR TIMES DAILY BEFORE MEALS AND AT BEDTIME 2 each 11  . cyclobenzaprine (FLEXERIL) 10 MG tablet Take 10 mg by mouth at bedtime as needed for muscle spasms.     . DAYVIGO 10 MG TABS Take by mouth.     . esomeprazole (NEXIUM) 20 MG capsule GENERIC FOR NEXIUM. TAKE 1 CAPSULE BY MOUTH DAILY AT LEAST 1 HOUR BEFORE A MEAL. SWALLOWING WHOLE. DO NOT CRUSH OR CHEW GRANULES (Patient taking differently: Take 20 mg by mouth daily. ) 90 capsule 0  . estradiol (VIVELLE-DOT) 0.025 MG/24HR APPLY TO SKIN TWICE WEEKLY AS DIRECTED 8 patch 1  . FARXIGA 10 MG TABS tablet Take 10 mg by mouth every morning.     . fluticasone (FLONASE) 50 MCG/ACT nasal spray as needed.   0  . Glucose Blood (GLUCOSE METER TEST VI) by In Vitro route 2 (two) times daily.    Marland Kitchen ibuprofen (ADVIL) 800 MG tablet Take 1 tablet (800 mg total) by mouth every 8 (eight) hours as needed. 30 tablet 0  . meloxicam (MOBIC) 15 MG tablet Take 15 mg by mouth daily as needed for pain.     . metFORMIN (GLUCOPHAGE) 500 MG tablet TAKE 1 TABLET BY MOUTH EVERY DAY WITH BREAKFAST (Patient taking differently: Take 500 mg by mouth daily with breakfast. TAKE 1 TABLET BY MOUTH EVERY DAY WITH BREAKFAST) 90 tablet 1  . Multiple Vitamin (MULTIVITAMIN WITH MINERALS) TABS tablet Take 1 tablet by mouth daily.    . Omega-3 1000 MG CAPS Take 1,000 mg by mouth daily.    . ondansetron (ZOFRAN) 4 MG tablet Take 1 tablet (4 mg total) by mouth daily as needed for nausea or vomiting. 30 tablet 1  . oxyCODONE (OXY IR/ROXICODONE) 5 MG immediate release tablet Take 1 tablet (5 mg total) by mouth every 6 (six) hours as needed for severe pain. 15 tablet 0  . progesterone (PROMETRIUM) 100 MG capsule TAKE 2 CAPSULES BY MOUTH EVERY NIGHT AT BEDTIME FOR 2 WEEKS, THEN 3 CAPSULES EVERY NIGHT AT BEDTIME IF  NEEDED. TAKE ON DAY 1 TO 27 OF EACH MONTH 81 capsule 1  . Semaglutide (RYBELSUS) 7 MG TABS Take 1 tablet by mouth daily. 30 tablet 3  . Suvorexant (BELSOMRA) 10 MG TABS Take 1 tablet by mouth at bedtime as needed. 30 tablet 0   No current facility-administered medications for this encounter.     REVIEW OF SYSTEMS: On review of systems, the patient reports that she is  doing well overall. Her breast incision has been somewhat delayed in healing per report. The area is slightly open, but not draining or causing fevers. No other complaints are noted.  PHYSICAL EXAM:  Wt Readings from Last 3 Encounters:  09/23/19 181 lb (82.1 kg)  09/20/19 182 lb 9.6 oz (82.8 kg)  09/16/19 185 lb 4.8 oz (84.1 kg)   Temp Readings from Last 3 Encounters:  09/23/19 (!) 97.2 F (36.2 C)  09/20/19 98 F (36.7 C) (Temporal)  09/16/19 97.8 F (36.6 C) (Temporal)   BP Readings from Last 3 Encounters:  09/23/19 120/86  09/20/19 120/84  09/16/19 125/90   Pulse Readings from Last 3 Encounters:  09/23/19 79  09/20/19 83  09/16/19 (!) 106    In general this is a well appearing African American female in no acute distress. She's alert and oriented x4 and appropriate throughout the examination. Cardiopulmonary assessment is negative for acute distress and she exhibits normal effort. Bilateral breast exam is deferred to the simulation department.   ECOG = 0  0 - Asymptomatic (Fully active, able to carry on all predisease activities without restriction)  1 - Symptomatic but completely ambulatory (Restricted in physically strenuous activity but ambulatory and able to carry out work of a light or sedentary nature. For example, light housework, office work)  2 - Symptomatic, <50% in bed during the day (Ambulatory and capable of all self care but unable to carry out any work activities. Up and about more than 50% of waking hours)  3 - Symptomatic, >50% in bed, but not bedbound (Capable of only limited self-care,  confined to bed or chair 50% or more of waking hours)  4 - Bedbound (Completely disabled. Cannot carry on any self-care. Totally confined to bed or chair)  5 - Death   Eustace Pen MM, Creech RH, Tormey DC, et al. [phone]). "Toxicity and response criteria of the [facility]". [place] Oncol. 5 (6): 649-55    LABORATORY DATA:  Lab Results  Component Value Date   WBC 6.2 09/16/2019   HGB 13.3 09/16/2019   HCT 41.4 09/16/2019   MCV 84.8 09/16/2019   PLT 268 09/16/2019   Lab Results  Component Value Date   NA 138 09/16/2019   K 4.1 09/16/2019   CL 106 09/16/2019   CO2 23 09/16/2019   Lab Results  Component Value Date   ALT 33 09/16/2019   AST 30 09/16/2019   ALKPHOS 69 09/16/2019   BILITOT 0.7 09/16/2019      RADIOGRAPHY: NM Sentinel Node Inj-No Rpt (Breast)  Result Date: 09/23/2019 Sulfur colloid was injected by the nuclear medicine technologist for melanoma sentinel node.   MM Breast Surgical Specimen  Result Date: 09/23/2019 CLINICAL DATA:  Specimen radiograph status post right breast lumpectomy. EXAM: SPECIMEN RADIOGRAPH OF THE RIGHT BREAST COMPARISON:  Previous exam(s). FINDINGS: Status post excision of the right breast. The radioactive seed and biopsy marker clip are present, completely intact, and were marked for pathology. These findings were communicated to the OR at the time of surgery. IMPRESSION: Specimen radiograph of the right breast. Electronically Signed   By: Ammie Ferrier M.D.   On: 09/23/2019 11:22   VAS [place] ABI WITH/WO TBI  Result Date: 09/20/2019 LOWER EXTREMITY DOPPLER STUDY Indications: Ulceration. High Risk Factors: Diabetes, no history of smoking. Other Factors: Chronic, recurring ulcer and skin discoloration adjacent medial                malleolus.  Performing Technologist:  Delorise Shiner RVT  Examination Guidelines: A complete evaluation includes at minimum, Doppler waveform signals and systolic blood pressure reading at the  level of bilateral brachial, anterior tibial, and posterior tibial arteries, when vessel segments are accessible. Bilateral testing is considered an integral part of a complete examination. Photoelectric Plethysmograph (PPG) waveforms and toe systolic pressure readings are included as required and additional duplex testing as needed. Limited examinations for reoccurring indications may be performed as noted.  ABI Findings: +---------+------------------+-----+---------+--------+ Right    Rt Pressure (mmHg)IndexWaveform Comment  +---------+------------------+-----+---------+--------+ Brachial 121                                      +---------+------------------+-----+---------+--------+ ATA      159               1.26                   +---------+------------------+-----+---------+--------+ PTA      141               1.12 triphasic         +---------+------------------+-----+---------+--------+ DP                              triphasic         +---------+------------------+-----+---------+--------+ Adair Patter               1.06                   +---------+------------------+-----+---------+--------+ +---------+------------------+-----+---------+-------+ Left     Lt Pressure (mmHg)IndexWaveform Comment +---------+------------------+-----+---------+-------+ Brachial 126                                     +---------+------------------+-----+---------+-------+ ATA      156               1.24                  +---------+------------------+-----+---------+-------+ PTA      149               1.18 triphasic        +---------+------------------+-----+---------+-------+ DP                              triphasic        +---------+------------------+-----+---------+-------+ Great Toe120               0.95                  +---------+------------------+-----+---------+-------+ +-------+-----------+-----------+------------+------------+ ABI/TBIToday's  ABIToday's TBIPrevious ABIPrevious TBI +-------+-----------+-----------+------------+------------+ Right  1.26       1.06                                +-------+-----------+-----------+------------+------------+ Left   1.24       0.95                                +-------+-----------+-----------+------------+------------+  Summary: Right: Resting right ankle-brachial index is within normal range. No evidence of significant right lower extremity arterial disease. The right toe-brachial index is normal. RT great toe pressure =  133 mmHg. Left: Resting left ankle-brachial index is within normal range. No evidence of significant left lower extremity arterial disease. The left toe-brachial index is normal. LT Great toe pressure = 120 mmHg.  *See table(s) above for measurements and observations.  Electronically signed by Harold Barban MD on 09/20/2019 at 11:47:35 AM.    Final    MM RT RADIOACTIVE SEED LOC MAMMO GUIDE  Result Date: 09/22/2019 CLINICAL DATA:  Pre lumpectomy localization of recently diagnosed right breast DCIS involving a papillary lesion. EXAM: MAMMOGRAPHIC GUIDED RADIOACTIVE SEED LOCALIZATION OF THE RIGHT BREAST COMPARISON:  Previous exam(s). FINDINGS: Patient presents for radioactive seed localization prior to right lumpectomy. I met with the patient and we discussed the procedure of seed localization including benefits and alternatives. We discussed the high likelihood of a successful procedure. We discussed the risks of the procedure including infection, bleeding, tissue injury and further surgery. We discussed the low dose of radioactivity involved in the procedure. Informed, written consent was given. The usual time-out protocol was performed immediately prior to the procedure. Using mammographic guidance, sterile technique, 1% lidocaine and an I-125 radioactive seed, the recently placed coil shaped biopsy marker clip in the upper inner right breast was localized using a medial  approach. The follow-up mammogram images confirm the seed in the expected location and were marked for Dr. Brantley Stage. Follow-up survey of the patient confirms presence of the radioactive seed. Order number of I-125 seed:  [id number]. Total activity:  0.252 mCi reference Date: 08/30/2019 The patient tolerated the procedure well and was released from the [facility]. She was given instructions regarding seed removal. IMPRESSION: Radioactive seed localization right breast. No apparent complications. Electronically Signed   By: Claudie Revering M.D.   On: 09/22/2019 11:57       IMPRESSION/PLAN: 1. 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 Dr. Lisbeth Renshaw reviews the final pathology findings and the nature of non invasive disease.  Fortunately no invasive component was ultimately seen at the time of her surgery.  Dr. Lisbeth Renshaw discusses the rationale for adjuvant radiotherapy in patients who have undergone breast conserving surgery as a way to reduce the risks of local recurrence.  Medical oncology also recommends antiestrogen therapy in the adjuvant setting which would occur after treatment with radiation.  We discussed the risks, benefits, short, and long term effects of radiotherapy, and the patient is interested in proceeding. Dr. Lisbeth Renshaw discusses the delivery and logistics of radiotherapy and recommends a course of 6-1/2 weeks of radiotherapy. Written consent is obtained and placed in the chart, a copy was provided to the patient. Simulation will occur later today and we will check her skin at that time.  2. BRCA status. She has tested positive for BRCA and is interested in meeting with a gynecologist in [place]. I will reach out to the navigators who were trying to coordinate this.   In a visit lasting 45 minutes, greater than 50% of the time was spent face to face discussing the patient's condition, in preparation for the discussion, and coordinating the patient's care.    The above documentation  reflects my direct findings during this shared patient visit. Please see the separate note by Dr. Lisbeth Renshaw on this date for the remainder of the patient's plan of care.    Carola Rhine, PAC

## 2019-10-14 NOTE — Addendum Note (Signed)
Encounter addended by: Hayden Pedro, PA-C on: 10/14/2019 12:38 PM  Actions taken: Level of Service modified

## 2019-10-15 ENCOUNTER — Encounter: Payer: Self-pay | Admitting: General Practice

## 2019-10-15 ENCOUNTER — Ambulatory Visit: Payer: BC Managed Care – PPO | Admitting: Radiation Oncology

## 2019-10-15 NOTE — Progress Notes (Signed)
[place] Psychosocial Distress Screening Clinical Social Work  Clinical Social Work was referred by distress screening protocol.  The patient scored a 5 on the Psychosocial Distress Thermometer which indicates moderate distress. Clinical Social Worker contacted patient by phone to assess for distress and other psychosocial needs. Unable to reach, left detailed voice mail w information about [place], contact information and encouragement to return call if desired.    ONCBCN DISTRESS SCREENING 10/14/2019  Screening Type Initial Screening  Distress experienced in past week (1-10) 5  Practical problem type Work/school  Referral to support programs     Clinical Social Worker follow up needed:   If yes, follow up plan:   Await patient call  Beverely Pace, [place], [place] Worker Phone:  [phone]

## 2019-10-18 ENCOUNTER — Encounter: Payer: Self-pay | Admitting: Surgery

## 2019-10-18 ENCOUNTER — Ambulatory Visit (HOSPITAL_COMMUNITY)
Admission: RE | Admit: 2019-10-18 | Discharge: 2019-10-18 | Disposition: A | Discharge status: Home / Self Care | Payer: BC Managed Care – PPO | Source: Ambulatory Visit | Attending: Surgery | Admitting: Surgery

## 2019-10-18 ENCOUNTER — Ambulatory Visit (INDEPENDENT_AMBULATORY_CARE_PROVIDER_SITE_OTHER): Payer: BC Managed Care – PPO | Admitting: Surgery

## 2019-10-18 ENCOUNTER — Other Ambulatory Visit: Payer: Self-pay

## 2019-10-18 VITALS — BP 118/78 | HR 86 | Temp 98.1°F | Resp 20 | Ht 66.0 in | Wt 184.0 lb

## 2019-10-18 DIAGNOSIS — I83893 Varicose veins of bilateral lower extremities with other complications: Secondary | ICD-10-CM

## 2019-10-18 DIAGNOSIS — I872 Venous insufficiency (chronic) (peripheral): Secondary | ICD-10-CM | POA: Insufficient documentation

## 2019-10-18 NOTE — Progress Notes (Signed)
Vascular and Vein Specialist of [facility]  Patient name: Vanessa Allen MRN: [id number] DOB: 1965-02-26 Sex: female   REASON FOR VISIT:    Follow up  HISOTRY OF PRESENT ILLNESS:   Vanessa Allen is a 54 y.o. female, who is referred for a chronic left leg wound that will heal and then reopened.  It all started several years ago following a scratch while she was in [place].  It has been biopsied.  She has been treated at the wound center.  It does continue to recur.  She does wear compression stockings.  There has been some darkening of the skin around the ankle.  Currently there is no active ulceration.  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  She denies any history of DVT.  She had ABIs at her last visit which were normal.  I placed her in 20-30 compression and encouraged her to keep her legs elevated.  She is back today for her venous stasis study.  Patient suffers from diabetes.  She has a history of right breast cancer she is a non-smoker.  She is medically managed for hypertension and takes a statin for hypercholesterolemia.    PAST MEDICAL HISTORY:   Past Medical History:  Diagnosis Date  . Cancer [facility])    Right Breast Cancer  . Diabetes mellitus without complication ([place])   . Family history of BRCA gene mutation   . Family history of breast cancer   . Family history of leukemia   . Family history of ovarian cancer   . Family history of stomach cancer   . GERD (gastroesophageal reflux disease)      FAMILY HISTORY:   Family History  Problem Relation Age of Onset  . Hyperlipidemia Mother   . Hypertension Mother   . Diabetes Father   . Heart disease Father   . Stomach cancer Father 75  . Hypertension Brother   . Hypertension Daughter   . Leukemia Paternal Aunt        dx. >50  . Breast cancer Niece 38  . BRCA 1/2 Niece   . Ovarian  cancer Cousin        d. <50 (maternal first cousin)  . Ovarian cancer Cousin        d. <50 (maternal first cousin)  . Ovarian cancer Cousin        d. late 48s (maternal first cousin)    SOCIAL HISTORY:   Social History   Tobacco Use  . Smoking status: Never Smoker  . Smokeless tobacco: Never Used  Substance Use Topics  . Alcohol use: Yes    Alcohol/week: 0.0 standard drinks    Comment: occasional     ALLERGIES:   No Known Allergies   CURRENT MEDICATIONS:   Current Outpatient Medications  Medication Sig Dispense Refill  . ACCU-CHEK GUIDE test strip USE TWICE DAILY TO CHECK BLOOD SUGAR BEFORE BREAKFAST AND DINNER 100 strip 5  . acidophilus (RISAQUAD) CAPS capsule Take 1 capsule by mouth daily.    Marland Kitchen ALPRAZolam (XANAX) 0.25 MG tablet Take 1 tablet (0.25 mg total) by mouth 3 (three) times daily as needed for anxiety. 30 tablet 0  . amitriptyline (ELAVIL) 25 MG tablet Take 1 tablet (25 mg total) by mouth at bedtime. 30 tablet 3  . aspirin EC 81 MG tablet Take 81 mg by mouth daily. Swallow whole.    Marland Kitchen  atorvastatin (LIPITOR) 10 MG tablet TAKE 1 TABLET(10 MG) BY MOUTH DAILY (Patient taking differently: Take 10 mg by mouth every 7 (seven) days. ) 90 tablet 1  . Biotin w/ Vitamins C & E (HAIR/SKIN/NAILS PO) Take 1 tablet by mouth daily.    . Coenzyme Q10 (CO Q-10) 100 MG CAPS Take by mouth.    . Continuous Blood Gluc Receiver (FREESTYLE LIBRE 14 DAY READER) DEVI USE TO CHECK BLOOD SUGAR BEFORE MEALS AND AT BEDTIME 1 each 2  . Continuous Blood Gluc Sensor (FREESTYLE LIBRE 14 DAY SENSOR) MISC USE TO MEASURE BLOOD GLUCOSE FOUR TIMES DAILY BEFORE MEALS AND AT BEDTIME 2 each 11  . cyclobenzaprine (FLEXERIL) 10 MG tablet Take 10 mg by mouth at bedtime as needed for muscle spasms.     . DAYVIGO 10 MG TABS Take by mouth.     . esomeprazole (NEXIUM) 20 MG capsule GENERIC FOR NEXIUM. TAKE 1 CAPSULE BY MOUTH DAILY AT LEAST 1 HOUR BEFORE A MEAL. SWALLOWING WHOLE. DO NOT CRUSH OR CHEW GRANULES  (Patient taking differently: Take 20 mg by mouth daily. ) 90 capsule 0  . estradiol (VIVELLE-DOT) 0.025 MG/24HR APPLY TO SKIN TWICE WEEKLY AS DIRECTED 8 patch 1  . FARXIGA 10 MG TABS tablet Take 10 mg by mouth every morning.     . Glucose Blood (GLUCOSE METER TEST VI) by In Vitro route 2 (two) times daily.    Marland Kitchen ibuprofen (ADVIL) 800 MG tablet Take 1 tablet (800 mg total) by mouth every 8 (eight) hours as needed. 30 tablet 0  . meloxicam (MOBIC) 15 MG tablet Take 15 mg by mouth daily as needed for pain.     . metFORMIN (GLUCOPHAGE) 500 MG tablet TAKE 1 TABLET BY MOUTH EVERY DAY WITH BREAKFAST (Patient taking differently: Take 500 mg by mouth daily with breakfast. TAKE 1 TABLET BY MOUTH EVERY DAY WITH BREAKFAST) 90 tablet 1  . Multiple Vitamin (MULTIVITAMIN WITH MINERALS) TABS tablet Take 1 tablet by mouth daily.    . Omega-3 1000 MG CAPS Take 1,000 mg by mouth daily.    . ondansetron (ZOFRAN) 4 MG tablet Take 1 tablet (4 mg total) by mouth daily as needed for nausea or vomiting. 30 tablet 1  . oxyCODONE (OXY IR/ROXICODONE) 5 MG immediate release tablet Take 1 tablet (5 mg total) by mouth every 6 (six) hours as needed for severe pain. 15 tablet 0  . progesterone (PROMETRIUM) 100 MG capsule TAKE 2 CAPSULES BY MOUTH EVERY NIGHT AT BEDTIME FOR 2 WEEKS, THEN 3 CAPSULES EVERY NIGHT AT BEDTIME IF NEEDED. TAKE ON DAY 1 TO 27 OF EACH MONTH 81 capsule 1  . Semaglutide (RYBELSUS) 7 MG TABS Take 1 tablet by mouth daily. 30 tablet 3  . Suvorexant (BELSOMRA) 10 MG TABS Take 1 tablet by mouth at bedtime as needed. 30 tablet 0   No current facility-administered medications for this visit.    REVIEW OF SYSTEMS:   _0  denotes positive finding, _1  denotes negative finding Cardiac  Comments:  Chest pain or chest pressure:    Shortness of breath upon exertion:    Short of breath when lying flat:    Irregular heart rhythm:        Vascular    Pain in calf, thigh, or hip brought on by ambulation:    Pain in  feet at night that wakes you up from your sleep:     Blood clot in your veins:    Leg swelling:  x  Pulmonary    Oxygen at home:    Productive cough:     Wheezing:         Neurologic    Sudden weakness in arms or legs:     Sudden numbness in arms or legs:     Sudden onset of difficulty speaking or slurred speech:    Temporary loss of vision in one eye:     Problems with dizziness:         Gastrointestinal    Blood in stool:     Vomited blood:         Genitourinary    Burning when urinating:     Blood in urine:        Psychiatric    Major depression:         Hematologic    Bleeding problems:    Problems with blood clotting too easily:        Skin    Rashes or ulcers: x       Constitutional    Fever or chills:      PHYSICAL EXAM:   Vitals:   10/18/19 1456  BP: 118/78  Pulse: 86  Resp: 20  Temp: 98.1 F (36.7 C)  SpO2: 98%  Weight: 184 lb (83.5 kg)  Height: _0  (1.676 m)    GENERAL: The patient is a well-nourished female, in no acute distress. The vital signs are documented above. CARDIAC: There is a regular rate and rhythm.  VASCULAR: Evidence of healed ulcer on the left medial ankle with hyperpigmentation and edema to the leg PULMONARY: Non-labored respirations MUSCULOSKELETAL: There are no major deformities or cyanosis. NEUROLOGIC: No focal weakness or paresthesias are detected. SKIN: There are no ulcers or rashes noted. PSYCHIATRIC: The patient has a normal affect.  STUDIES:   I have reviewed the following studies: +--------------+---------+------+-----------+------------+--------+  LEFT     Reflux NoRefluxReflux TimeDiameter cmsComments               Yes                   +--------------+---------+------+-----------+------------+--------+  CFV            yes  >1 second             +--------------+---------+------+-----------+------------+--------+  GSV at SFJ         yes  >500 ms   0.87        +--------------+---------+------+-----------+------------+--------+  GSV prox thigh      yes  >500 ms   0.60        +--------------+---------+------+-----------+------------+--------+  GSV mid thigh       yes  >500 ms   0.55        +--------------+---------+------+-----------+------------+--------+  GSV dist thigh      yes  >500 ms   0.47        +--------------+---------+------+-----------+------------+--------+  GSV at knee        yes  >500 ms   0.55        +--------------+---------+------+-----------+------------+--------+  GSV prox calf       yes  >500 ms   0.74        +--------------+---------+------+-----------+------------+--------+  GSV mid calf       yes  >500 ms   0.46        +--------------+---------+------+-----------+------------+--------+  GSV dist calf       yes  >500 ms   0.36        +--------------+---------+------+-----------+------------+--------+  SSV Pop Fossa  0.38        +--------------+---------+------+-----------+------------+--------+  SSV prox calf                 0.39        +--------------+---------+------+-----------+------------+--------+  SSV mid calf                 0.34        +--------------+---------+------+-----------+------------+--------+                         0.31        +--------------+---------+------+-----------+------------+--------+         Summary:  Left:  - No evidence of deep vein thrombosis seen in the left lower extremity,  from the common femoral through the popliteal veins.  - No evidence of superficial venous reflux seen in the left short  saphenous vein.  - Venous reflux is noted in the left  common femoral vein.  - Venous reflux is noted in the left sapheno-femoral junction.  - Venous reflux is noted in the left greater saphenous vein in the thigh.  - Venous reflux is noted in the left greater saphenous vein in the calf.    MEDICAL ISSUES:   Left CEAP class 5: The patient continues to to wear her compression stockings.  She does not have an active ulcer at this time.  She had a venous reflux studies drawn today that shows significant reflux in the left saphenous vein.  Vein diameter measurements are 0.87 at the saphenofemoral junction and 0.74 cm by the knee.  I will refer her to the vein clinic for consideration of endovenous laser ablation of the left leg.    Leia Alf, MD, FACS Vascular and Vein Specialists of [facility] [phone] Pager [phone]

## 2019-10-20 DIAGNOSIS — D0511 Intraductal carcinoma in situ of right breast: Secondary | ICD-10-CM | POA: Diagnosis not present

## 2019-10-20 DIAGNOSIS — Z510 Encounter for antineoplastic radiation therapy: Secondary | ICD-10-CM | POA: Diagnosis not present

## 2019-10-20 DIAGNOSIS — M25561 Pain in right knee: Secondary | ICD-10-CM | POA: Diagnosis not present

## 2019-10-21 ENCOUNTER — Encounter: Payer: Self-pay | Admitting: *Deleted

## 2019-10-21 ENCOUNTER — Telehealth: Payer: Self-pay | Admitting: *Deleted

## 2019-10-21 DIAGNOSIS — Z1509 Genetic susceptibility to other malignant neoplasm: Secondary | ICD-10-CM

## 2019-10-21 DIAGNOSIS — Z1501 Genetic susceptibility to malignant neoplasm of breast: Secondary | ICD-10-CM

## 2019-10-21 DIAGNOSIS — Z1502 Genetic susceptibility to malignant neoplasm of ovary: Secondary | ICD-10-CM

## 2019-10-21 NOTE — Telephone Encounter (Signed)
Pt called requesting referral to Dr. Louretta Shorten for gynecology. Referral placed. Records faxed to [facility]. Pt notified of referral placed

## 2019-10-25 ENCOUNTER — Other Ambulatory Visit: Payer: Self-pay

## 2019-10-25 ENCOUNTER — Ambulatory Visit
Admission: RE | Admit: 2019-10-25 | Discharge: 2019-10-25 | Disposition: A | Discharge status: Home / Self Care | Payer: BC Managed Care – PPO | Source: Ambulatory Visit | Attending: Radiation Oncology | Admitting: Radiation Oncology

## 2019-10-25 ENCOUNTER — Encounter: Payer: BC Managed Care – PPO | Admitting: Physical Therapy

## 2019-10-25 DIAGNOSIS — D0511 Intraductal carcinoma in situ of right breast: Secondary | ICD-10-CM | POA: Diagnosis not present

## 2019-10-25 DIAGNOSIS — Z510 Encounter for antineoplastic radiation therapy: Secondary | ICD-10-CM | POA: Diagnosis not present

## 2019-10-26 ENCOUNTER — Encounter: Payer: Self-pay | Admitting: Oncology

## 2019-10-26 ENCOUNTER — Other Ambulatory Visit: Payer: Self-pay

## 2019-10-26 ENCOUNTER — Ambulatory Visit (INDEPENDENT_AMBULATORY_CARE_PROVIDER_SITE_OTHER): Payer: BC Managed Care – PPO | Admitting: Nurse Practitioner

## 2019-10-26 ENCOUNTER — Other Ambulatory Visit: Payer: Self-pay | Admitting: Nurse Practitioner

## 2019-10-26 ENCOUNTER — Encounter: Payer: Self-pay | Admitting: Nurse Practitioner

## 2019-10-26 ENCOUNTER — Ambulatory Visit
Admission: RE | Admit: 2019-10-26 | Discharge: 2019-10-26 | Disposition: A | Discharge status: Home / Self Care | Payer: BC Managed Care – PPO | Source: Ambulatory Visit | Attending: Radiation Oncology | Admitting: Radiation Oncology

## 2019-10-26 VITALS — BP 122/80 | HR 82 | Temp 98.0°F | Ht 65.8 in | Wt 184.0 lb

## 2019-10-26 DIAGNOSIS — Z23 Encounter for immunization: Secondary | ICD-10-CM

## 2019-10-26 DIAGNOSIS — Z510 Encounter for antineoplastic radiation therapy: Secondary | ICD-10-CM | POA: Diagnosis not present

## 2019-10-26 DIAGNOSIS — E119 Type 2 diabetes mellitus without complications: Secondary | ICD-10-CM | POA: Diagnosis not present

## 2019-10-26 DIAGNOSIS — F419 Anxiety disorder, unspecified: Secondary | ICD-10-CM | POA: Diagnosis not present

## 2019-10-26 DIAGNOSIS — D0511 Intraductal carcinoma in situ of right breast: Secondary | ICD-10-CM

## 2019-10-26 LAB — POCT UA - MICROALBUMIN
Albumin/Creatinine Ratio, Urine, POC: 30
Creatinine, POC: 10 mg/dL
Microalbumin Ur, POC: 10 mg/L

## 2019-10-26 MED ORDER — RYBELSUS 14 MG PO TABS: 1.0000 | ORAL_TABLET | Freq: Every day | ORAL | 3 refills | Status: DC

## 2019-10-26 NOTE — Addendum Note (Signed)
Addended by: Luana Shu on: 10/26/2019 02:59 PM   Modules accepted: Orders

## 2019-10-26 NOTE — Progress Notes (Signed)
I,Yamilka Roman [employer] as a Education administrator for [employer], FNP.,have documented all relevant documentation on the behalf of [name], FNP,as directed by  [name], FNP while in the presence of [name], [place].  This visit occurred during the SARS-CoV-2 public health emergency.  Safety protocols were in place, including screening questions prior to the visit, additional usage of staff PPE, and extensive cleaning of exam room while observing appropriate contact time as indicated for disinfecting solutions.  Subjective:     Patient ID: Vanessa Allen , female    DOB: 1965/09/12 , 54 y.o.   MRN: [id number]   Chief Complaint  Patient presents with  . Diabetes    HPI  She was diagnosed with DCIF in June 2021.  She is starting radiation daily for 5 days until Nov 9th for her right breast cancer.  No plans for chemo - margins were clean.  She is seeing Dr. Jana Hakim.  She has not been back to work since June 2021.  She has a niece with breast cancer with BRCA 2 positive - who is her brothers daughter.  She is also BRCA2 positive.  She will be going to Dr. Louretta Shorten - GYN due to the risk for ovarian cancer.  Wt Readings from Last 3 Encounters: 10/26/19 : 184 lb (83.5 kg) 10/18/19 : 184 lb (83.5 kg) 10/14/19 : 184 lb 2 oz (83.5 kg)   Diabetes She presents for her follow-up diabetic visit. Diabetes type: prediabetes. There are no hypoglycemic associated symptoms. There are no diabetic associated symptoms. Pertinent negatives for diabetes include no chest pain. There are no hypoglycemic complications. Symptoms are stable. There are no diabetic complications. Risk factors for coronary artery disease include obesity and sedentary lifestyle. Current diabetic treatment includes oral agent (monotherapy). She is compliant with treatment all of the time. She is following a generally healthy diet. When asked about meal planning, she reported none. She has not had a previous visit with a dietitian.  There is no change in her home blood glucose trend. (Was 133 this morning but knew she had eaten something bad) An ACE inhibitor/angiotensin II receptor blocker is not being taken.  Insomnia Primary symptoms: no sleep disturbance.  The onset quality is gradual. The problem occurs nightly. Past treatments include medication (belsomra - has not seen significant difference but not taking consistently). The treatment provided no relief. Prior workup: she had a home sleep study was not approved for sleep study in person.     Past Medical History:  Diagnosis Date  . Cancer [facility])    Right Breast Cancer  . Diabetes mellitus without complication ([place])   . Family history of BRCA gene mutation   . Family history of breast cancer   . Family history of leukemia   . Family history of ovarian cancer   . Family history of stomach cancer   . GERD (gastroesophageal reflux disease)      Family History  Problem Relation Age of Onset  . Hyperlipidemia Mother   . Hypertension Mother   . Diabetes Father   . Heart disease Father   . Stomach cancer Father 34  . Hypertension Brother   . Hypertension Daughter   . Leukemia Paternal Aunt        dx. >50  . Breast cancer Niece 65  . BRCA 1/2 Niece   . Ovarian cancer Cousin        d. <50 (maternal first cousin)  . Ovarian cancer Cousin  d. <50 (maternal first cousin)  . Ovarian cancer Cousin        d. late 72s (maternal first cousin)     Current Outpatient Medications:  .  ACCU-CHEK GUIDE test strip, USE TWICE DAILY TO CHECK BLOOD SUGAR BEFORE BREAKFAST AND DINNER, Disp: 100 strip, Rfl: 5 .  acidophilus (RISAQUAD) CAPS capsule, Take 1 capsule by mouth daily., Disp: , Rfl:  .  ALPRAZolam (XANAX) 0.25 MG tablet, Take 1 tablet (0.25 mg total) by mouth 3 (three) times daily as needed for anxiety., Disp: 30 tablet, Rfl: 0 .  amitriptyline (ELAVIL) 25 MG tablet, Take 1 tablet (25 mg total) by mouth at bedtime., Disp: 30 tablet, Rfl: 3 .  aspirin EC 81  MG tablet, Take 81 mg by mouth daily. Swallow whole., Disp: , Rfl:  .  atorvastatin (LIPITOR) 10 MG tablet, TAKE 1 TABLET(10 MG) BY MOUTH DAILY (Patient taking differently: Take 10 mg by mouth every 7 (seven) days. ), Disp: 90 tablet, Rfl: 1 .  Biotin w/ Vitamins C & E (HAIR/SKIN/NAILS PO), Take 1 tablet by mouth daily., Disp: , Rfl:  .  Coenzyme Q10 (CO Q-10) 100 MG CAPS, Take by mouth., Disp: , Rfl:  .  Continuous Blood Gluc Receiver (FREESTYLE LIBRE 14 DAY READER) DEVI, USE TO CHECK BLOOD SUGAR BEFORE MEALS AND AT BEDTIME, Disp: 1 each, Rfl: 2 .  Continuous Blood Gluc Sensor (FREESTYLE LIBRE 14 DAY SENSOR) MISC, USE TO MEASURE BLOOD GLUCOSE FOUR TIMES DAILY BEFORE MEALS AND AT BEDTIME, Disp: 2 each, Rfl: 11 .  cyclobenzaprine (FLEXERIL) 10 MG tablet, Take 10 mg by mouth at bedtime as needed for muscle spasms. , Disp: , Rfl:  .  DAYVIGO 10 MG TABS, Take by mouth. , Disp: , Rfl:  .  esomeprazole (NEXIUM) 20 MG capsule, GENERIC FOR NEXIUM. TAKE 1 CAPSULE BY MOUTH DAILY AT LEAST 1 HOUR BEFORE A MEAL. SWALLOWING WHOLE. DO NOT CRUSH OR CHEW GRANULES (Patient taking differently: Take 20 mg by mouth daily. ), Disp: 90 capsule, Rfl: 0 .  estradiol (VIVELLE-DOT) 0.025 MG/24HR, APPLY TO SKIN TWICE WEEKLY AS DIRECTED, Disp: 8 patch, Rfl: 1 .  FARXIGA 10 MG TABS tablet, Take 10 mg by mouth every morning. , Disp: , Rfl:  .  Glucose Blood (GLUCOSE METER TEST VI), by In Vitro route 2 (two) times daily., Disp: , Rfl:  .  ibuprofen (ADVIL) 800 MG tablet, Take 1 tablet (800 mg total) by mouth every 8 (eight) hours as needed., Disp: 30 tablet, Rfl: 0 .  meloxicam (MOBIC) 15 MG tablet, Take 15 mg by mouth daily as needed for pain. , Disp: , Rfl:  .  metFORMIN (GLUCOPHAGE) 500 MG tablet, TAKE 1 TABLET BY MOUTH EVERY DAY WITH BREAKFAST (Patient taking differently: Take 500 mg by mouth daily with breakfast. TAKE 1 TABLET BY MOUTH EVERY DAY WITH BREAKFAST), Disp: 90 tablet, Rfl: 1 .  Multiple Vitamin (MULTIVITAMIN WITH  MINERALS) TABS tablet, Take 1 tablet by mouth daily., Disp: , Rfl:  .  Omega-3 1000 MG CAPS, Take 1,000 mg by mouth daily., Disp: , Rfl:  .  ondansetron (ZOFRAN) 4 MG tablet, Take 1 tablet (4 mg total) by mouth daily as needed for nausea or vomiting., Disp: 30 tablet, Rfl: 1 .  oxyCODONE (OXY IR/ROXICODONE) 5 MG immediate release tablet, Take 1 tablet (5 mg total) by mouth every 6 (six) hours as needed for severe pain., Disp: 15 tablet, Rfl: 0 .  progesterone (PROMETRIUM) 100 MG capsule, TAKE 2 CAPSULES BY  MOUTH EVERY NIGHT AT BEDTIME FOR 2 WEEKS, THEN 3 CAPSULES EVERY NIGHT AT BEDTIME IF NEEDED. TAKE ON DAY 1 TO 27 OF EACH MONTH, Disp: 81 capsule, Rfl: 1 .  Suvorexant (BELSOMRA) 10 MG TABS, Take 1 tablet by mouth at bedtime as needed., Disp: 30 tablet, Rfl: 0 .  Semaglutide (RYBELSUS) 14 MG TABS, Take 1 tablet by mouth daily. Take 30 minutes before breakfast, Disp: 30 tablet, Rfl: 3   No Known Allergies   Review of Systems  Constitutional: Negative.   Respiratory: Negative.   Cardiovascular: Negative.  Negative for chest pain, palpitations and leg swelling.  Neurological: Negative.   Psychiatric/Behavioral: Negative for sleep disturbance. The patient has insomnia.      Today's Vitals   10/26/19 0904  BP: 122/80  Pulse: 82  Temp: 98 F (36.7 C)  TempSrc: Oral  Weight: 184 lb (83.5 kg)  Height: 5' 5.8" (1.671 m)  PainSc: 0-No pain   Body mass index is 29.88 kg/m.   Objective:  Physical Exam Vitals reviewed.  Constitutional:      General: She is not in acute distress.    Appearance: Normal appearance.  Cardiovascular:     Rate and Rhythm: Normal rate and regular rhythm.     Pulses: Normal pulses.     Heart sounds: Normal heart sounds. No murmur heard.   Pulmonary:     Effort: Pulmonary effort is normal. No respiratory distress.     Breath sounds: Normal breath sounds.  Skin:    Capillary Refill: Capillary refill takes less than 2 seconds.  Neurological:     General: No  focal deficit present.     Mental Status: She is alert and oriented to person, place, and time.     Cranial Nerves: No cranial nerve deficit.  Psychiatric:        Mood and Affect: Mood normal.        Behavior: Behavior normal.        Thought Content: Thought content normal.        Judgment: Judgment normal.         Assessment And Plan:     1. Type 2 diabetes mellitus without complication, without long-term current use of insulin (HCC)  Chronic, stable  Her last HgbA1c was 6.4 one month ago  Will have her to return in 4 months for recheck  Will check urine micro today but was unable to obtain while here will bring back later today  2. Anxiety  Will refill her lorazepam she is currently having multiple health challenges  She is newly diagnosed with breast cancer   3. Ductal carcinoma in situ (DCIS) of right breast  She is being followed by Dr. Jana Hakim and starting radiation this week  She is currently out of work and her Oncologist will be writing her letter  4. Need for influenza vaccination  Influenza vaccine administered  Encouraged to take Tylenol as needed for fever or muscle aches. - Flu Vaccine QUAD 6+ mos PF IM (Fluarix Quad PF)     Patient was given opportunity to ask questions. Patient verbalized understanding of the plan and was able to repeat key elements of the plan. All questions were answered to their satisfaction.   Teola Bradley, FNP, have reviewed all documentation for this visit. The documentation on 10/26/19 for the exam, diagnosis, procedures, and orders are all accurate and complete.  THE PATIENT IS ENCOURAGED TO PRACTICE SOCIAL DISTANCING DUE TO THE COVID-19 PANDEMIC.

## 2019-10-26 NOTE — Patient Instructions (Signed)
Influenza Virus Vaccine (Flucelvax) What is this medicine? INFLUENZA VIRUS VACCINE (in floo EN zuh VAHY ruhs vak SEEN) helps to reduce the risk of getting influenza also known as the flu. The vaccine only helps protect you against some strains of the flu. This medicine may be used for other purposes; ask your health care provider or pharmacist if you have questions. COMMON BRAND NAME(S): FLUCELVAX What should I tell my health care provider before I take this medicine? They need to know if you have any of these conditions:  bleeding disorder like hemophilia  fever or infection  Guillain-Barre syndrome or other neurological problems  immune system problems  infection with the human immunodeficiency virus (HIV) or AIDS  low blood platelet counts  multiple sclerosis  an unusual or allergic reaction to influenza virus vaccine, other medicines, foods, dyes or preservatives  pregnant or trying to get pregnant  breast-feeding How should I use this medicine? This vaccine is for injection into a muscle. It is given by a health care professional. A copy of Vaccine Information Statements will be given before each vaccination. Read this sheet carefully each time. The sheet may change frequently. Talk to your pediatrician regarding the use of this medicine in children. Special care may be needed. Overdosage: If you think you've taken too much of this medicine contact a poison control center or emergency room at once. Overdosage: If you think you have taken too much of this medicine contact a poison control center or emergency room at once. NOTE: This medicine is only for you. Do not share this medicine with others. What if I miss a dose? This does not apply. What may interact with this medicine?  chemotherapy or radiation therapy  medicines that lower your immune system like etanercept, anakinra, infliximab, and adalimumab  medicines that treat or prevent blood clots like  warfarin  phenytoin  steroid medicines like prednisone or cortisone  theophylline  vaccines This list may not describe all possible interactions. Give your health care provider a list of all the medicines, herbs, non-prescription drugs, or dietary supplements you use. Also tell them if you smoke, drink alcohol, or use illegal drugs. Some items may interact with your medicine. What should I watch for while using this medicine? Report any side effects that do not go away within 3 days to your doctor or health care professional. Call your health care provider if any unusual symptoms occur within 6 weeks of receiving this vaccine. You may still catch the flu, but the illness is not usually as bad. You cannot get the flu from the vaccine. The vaccine will not protect against colds or other illnesses that may cause fever. The vaccine is needed every year. What side effects may I notice from receiving this medicine? Side effects that you should report to your doctor or health care professional as soon as possible:  allergic reactions like skin rash, itching or hives, swelling of the face, lips, or tongue Side effects that usually do not require medical attention (Report these to your doctor or health care professional if they continue or are bothersome.):  fever  headache  muscle aches and pains  pain, tenderness, redness, or swelling at the injection site  tiredness This list may not describe all possible side effects. Call your doctor for medical advice about side effects. You may report side effects to FDA at 1-800-FDA-1088. Where should I keep my medicine? The vaccine will be given by a health care professional in a clinic, pharmacy, doctor's   office, or other health care setting. You will not be given vaccine doses to store at home. NOTE: This sheet is a summary. It may not cover all possible information. If you have questions about this medicine, talk to your doctor, pharmacist, or  health care provider.  2020 Elsevier/Gold Standard (2010-12-26 14:06:47)  

## 2019-10-27 ENCOUNTER — Ambulatory Visit
Admission: RE | Admit: 2019-10-27 | Discharge: 2019-10-27 | Disposition: A | Discharge status: Home / Self Care | Payer: BC Managed Care – PPO | Source: Ambulatory Visit | Attending: Radiation Oncology | Admitting: Radiation Oncology

## 2019-10-27 DIAGNOSIS — Z510 Encounter for antineoplastic radiation therapy: Secondary | ICD-10-CM | POA: Diagnosis not present

## 2019-10-27 DIAGNOSIS — D0511 Intraductal carcinoma in situ of right breast: Secondary | ICD-10-CM | POA: Diagnosis not present

## 2019-10-28 ENCOUNTER — Other Ambulatory Visit: Payer: Self-pay | Admitting: Nurse Practitioner

## 2019-10-28 ENCOUNTER — Ambulatory Visit
Admission: RE | Admit: 2019-10-28 | Discharge: 2019-10-28 | Disposition: A | Discharge status: Home / Self Care | Payer: BC Managed Care – PPO | Source: Ambulatory Visit | Attending: Radiation Oncology | Admitting: Radiation Oncology

## 2019-10-28 ENCOUNTER — Encounter: Payer: Self-pay | Admitting: Nurse Practitioner

## 2019-10-28 DIAGNOSIS — Z510 Encounter for antineoplastic radiation therapy: Secondary | ICD-10-CM | POA: Diagnosis not present

## 2019-10-28 DIAGNOSIS — D0511 Intraductal carcinoma in situ of right breast: Secondary | ICD-10-CM | POA: Diagnosis not present

## 2019-10-28 DIAGNOSIS — F419 Anxiety disorder, unspecified: Secondary | ICD-10-CM

## 2019-10-28 MED ORDER — ALPRAZOLAM 0.5 MG PO TABS: 0.5000 mg | ORAL_TABLET | Freq: Three times a day (TID) | ORAL | 1 refills | Status: DC | PRN

## 2019-10-28 NOTE — Progress Notes (Signed)
Pt here for patient teaching.  Pt given Radiation and You booklet, skin care instructions, Alra deodorant and Radiaplex gel.  Reviewed areas of pertinence such as fatigue, hair loss, skin changes, breast tenderness and breast swelling . Pt able to give teach back of to pat skin and use unscented/gentle soap,apply Radiaplex bid, avoid applying anything to skin within 4 hours of treatment, avoid wearing an under wire bra and to use an electric razor if they must shave. Pt verbalizes understanding of information given and will contact nursing with any questions or concerns.     Yoshiye Kraft M. Junie Avilla RN, BSN      

## 2019-10-29 ENCOUNTER — Ambulatory Visit
Admission: RE | Admit: 2019-10-29 | Discharge: 2019-10-29 | Disposition: A | Discharge status: Home / Self Care | Payer: BC Managed Care – PPO | Source: Ambulatory Visit | Attending: Radiation Oncology | Admitting: Radiation Oncology

## 2019-10-29 DIAGNOSIS — D0511 Intraductal carcinoma in situ of right breast: Secondary | ICD-10-CM | POA: Diagnosis not present

## 2019-10-29 DIAGNOSIS — Z510 Encounter for antineoplastic radiation therapy: Secondary | ICD-10-CM | POA: Diagnosis not present

## 2019-10-29 MED ORDER — RADIAPLEXRX EX GEL: Freq: Once | CUTANEOUS | Status: AC

## 2019-10-29 MED ORDER — ALRA NON-METALLIC DEODORANT (RAD-ONC)
1.0000 "application " | Freq: Once | TOPICAL | Status: AC
  Administered 2019-10-29: 1 via TOPICAL

## 2019-11-01 ENCOUNTER — Other Ambulatory Visit: Payer: Self-pay

## 2019-11-01 ENCOUNTER — Ambulatory Visit: Payer: BC Managed Care – PPO | Admitting: Physical Therapy

## 2019-11-01 ENCOUNTER — Ambulatory Visit: Payer: BC Managed Care – PPO | Attending: Surgery | Admitting: Physical Therapy

## 2019-11-01 ENCOUNTER — Ambulatory Visit (INDEPENDENT_AMBULATORY_CARE_PROVIDER_SITE_OTHER): Payer: BC Managed Care – PPO | Admitting: Psychology

## 2019-11-01 ENCOUNTER — Ambulatory Visit
Admission: RE | Admit: 2019-11-01 | Discharge: 2019-11-01 | Disposition: A | Discharge status: Home / Self Care | Payer: BC Managed Care – PPO | Source: Ambulatory Visit | Attending: Radiation Oncology | Admitting: Radiation Oncology

## 2019-11-01 DIAGNOSIS — D0511 Intraductal carcinoma in situ of right breast: Secondary | ICD-10-CM | POA: Diagnosis not present

## 2019-11-01 DIAGNOSIS — F5101 Primary insomnia: Secondary | ICD-10-CM

## 2019-11-01 DIAGNOSIS — Z483 Aftercare following surgery for neoplasm: Secondary | ICD-10-CM | POA: Diagnosis not present

## 2019-11-01 DIAGNOSIS — R293 Abnormal posture: Secondary | ICD-10-CM | POA: Diagnosis not present

## 2019-11-01 DIAGNOSIS — M25511 Pain in right shoulder: Secondary | ICD-10-CM

## 2019-11-01 DIAGNOSIS — Z510 Encounter for antineoplastic radiation therapy: Secondary | ICD-10-CM | POA: Diagnosis not present

## 2019-11-01 NOTE — Patient Instructions (Addendum)
1. Decompression Exercise     Cancer Rehab [phone]    Lie on back on firm surface, knees bent, feet flat, arms turned up, out to sides, backs of hands down. Time _5-15__ minutes. Surface: floor   2. Shoulder Press    Start in Decompression Exercise position. Press shoulders downward towards supporting surface. Hold __2-3__ seconds while counting out loud. Repeat _3-5___ times. Do _1-2___ times per day.   3. Head Press    Bring cervical spine (neck) into neutral position (by either tucking the chin towards the chest or tilting the chin upward). Feel weight on back of head. Press head downward into supporting surface.    Hold _2-3__ seconds. Repeat _3-5__ times. Do _1-2__ times per day.   4. Leg Lengthener    Straighten one leg. Pull toes AND forefoot toward knee, extend heel. Lengthen leg by pulling pelvis away from ribs. Hold _2-3__ seconds. Relax. Repeat __4-6__ times. Do other leg.  Surface: floor   5. Leg Press    Straighten one leg down to floor keeping leg aligned with hip. Pull toes AND forefoot toward knee; extend heel.  Press entire leg downward (as if pressing leg into sandy beach). DO NOT BEND KNEE. Hold _2-3__ seconds. Do __4-6__ times. Repeat with other leg.    SHOULDER: Flexion - Supine (Cane)        Cancer Rehab [phone]    Hold cane in both hands. Raise arms up overhead. Do not allow back to arch. Hold _5__ seconds. Do __5-10__ times; __1-2__ times a day.   SELF ASSISTED WITH OBJECT: Shoulder Abduction / Adduction - Supine    Hold cane with both hands. Move both arms from side to side, keep elbows straight.  Hold when stretch felt for __5__ seconds. Repeat __5-10__ times; __1-2__ times a day. Once this becomes easier progress to third picture bringing affected arm towards ear by staying out to side. Same hold for _5_seconds. Repeat  _5-10_ times, _1-2_ times/day.  Shoulder Blade Stretch    Clasp fingers behind head with elbows touching in front of  face. Pull elbows back while pressing shoulder blades together. Relax and hold as tolerated, can place pillow under elbow here for comfort as needed and to allow for prolonged stretch.  Repeat __5__ times. Do __1-2__ sessions per day.            Flexion (Eccentric) - Active-Assist (Cane)          Cancer Rehab [phone]    Use unaffected arm to push affected arm forward. Avoid hiking shoulder (shoulder should NOT touch cheek). Keep palm relaxed. Slowly lower affected arm. Hold stretch for _5_ seconds repeating _5-10_ times, _1-2_ times a day.  Abduction (Eccentric) - Active-Assist (Cane)    Use unaffected arm to push affected arm out to side. Avoid hiking shoulder (shoulder should NOT touch cheek). Keep palm relaxed. Slowly lower affected arm. Hold stretch _5_ seconds repeating _5-10_ times, _1-2_ times a day.  Cane Exercise: Extension   Stand holding cane behind back with both hands palm-up. Lift the cane away from body until gentle stretch felt. Do NOT lean forward.  Hold __5__ seconds. Repeat _5-10___ times. Do _1-2___ sessions per day.  CHEST: Doorway, Bilateral - Standing    Standing in doorway, place hands on wall with elbows bent at shoulder height and place one foot in front of other. Shift weight onto front foot. Hold _10-20__ seconds. Do _3-5_ times, _1-2_ times a day.      First of all,  check with your insurance company to see if provider is in [place] (for wigs and compression sleeves / gloves/gauntlets )  [place], [place] [address] [phone]  Will file some insurances --- call for appointment   Second to [facility] (for mastectomy prosthetics and garments) [place], [place] [address] [phone] Will file some insurances --- call for appointment  [facility]  [address]  [place], [place] [address] [phone] Lower extremity garments  [facility] [place]  [place], [place]  [address] [place] ( Medicaid certified lymphedema fitter) [phone] [email or web address]  [place]  [address], [place] [address] [phone]  Other Resources: National Lymphedema Network:  www.lymphnet.org www.Klosetraining.com for patient articles and self manual lymph drainage information www.lymphedemablog.com has informative articles.  DishTag.[email or web address] www.lymphedemaproducts.com www.brightlifedirect.com [email or web address]

## 2019-11-01 NOTE — Therapy (Signed)
[place], [place], [address] Phone: [phone]   Fax:  [phone]  Physical Therapy Re-Evaluation  Patient Details  Name: Vanessa Allen MRN: [id number] Date of Birth: 01/19/1966 Referring Provider (PT): Dr. Erroll Luna   Encounter Date: 11/01/2019   PT End of Session - 11/01/19 1232    Visit Number 2    Number of Visits 2    Date for PT Re-Evaluation 11/01/19    PT Start Time 1000    PT Stop Time 1045    PT Time Calculation (min) 45 min    Activity Tolerance Patient tolerated treatment well    Behavior During Therapy [facility] for tasks assessed/performed           Past Medical History:  Diagnosis Date  . Cancer [facility])    Right Breast Cancer  . Diabetes mellitus without complication ([place])   . Family history of BRCA gene mutation   . Family history of breast cancer   . Family history of leukemia   . Family history of ovarian cancer   . Family history of stomach cancer   . GERD (gastroesophageal reflux disease)     Past Surgical History:  Procedure Laterality Date  . APPENDECTOMY    . BREAST LUMPECTOMY WITH RADIOACTIVE SEED AND SENTINEL LYMPH NODE BIOPSY Right 09/23/2019   Procedure: RIGHT BREAST LUMPECTOMY WITH RADIOACTIVE SEED AND SENTINEL LYMPH NODE MAPPING;  Surgeon: Erroll Luna, MD;  Location: [place];  Service: General;  Laterality: Right;  PEC BLOCK  . CHOLECYSTECTOMY    . Lipoma removal Left    Left Lower Back  . MYOMECTOMY    . WISDOM TOOTH EXTRACTION      There were no vitals filed for this visit.    Subjective Assessment - 11/01/19 1013    Subjective Pt states she always a constant soreness in her shoulder that gets worse when she tries to move it  Incision is healing but she has 2 spots she is watching. She is monitoring for signs of infection    Pertinent History Patient was diagnosed on 07/23/2019 with right intermediate grade DCIS with concerns for possible invasive  disease. It measures 1.9 cm and is located in the upper inner quadrant. It is ER/PR positive. On 09/23/2019, she had a right lumpectomy with 3 nodes removed.  Radiatation started 9/27 and is schedled til 11/9 she will have no chemo    Patient Stated Goals Learn  post op exercises and continue monitoring    Currently in Pain? Yes    Pain Score 2    sharp pain in shoulder at about a 7 with movement             [facility] PT Assessment - 11/01/19 0001      Assessment   Medical Diagnosis Right breast cancer    Referring Provider (PT) Dr. Marcello Moores [place]    Onset Date/Surgical Date 07/23/19    Hand Dominance Left    Prior Therapy none      Precautions   Precautions Other (comment)    Precaution Comments active cancer      Restrictions   Weight Bearing Restrictions No      Home Environment   Living Environment Private residence    Living Arrangements Alone    Available Help at Discharge Family      Prior Function   Level of Independence Independent    Vocation --   not currently working    [employer] travel  nurse    Leisure She does not exercise      Cognition   Overall Cognitive Status Within Functional Limits for tasks assessed      Observation/Other Assessments   Observations Pt with healing incsions in right axilla and right medial quadrant of breast with small areas at both ends of axillary incision and central portion of breast incision that have delayed healing but are mostly closed     Quick DASH  6.82      Coordination   Gross Motor Movements are Fluid and Coordinated Yes      Posture/Postural Control   Posture/Postural Control Postural limitations    Postural Limitations Rounded Shoulders;Forward head      ROM / Strength   AROM / PROM / Strength AROM;Strength      AROM   Overall AROM Comments Right cervical sidebending and left rotation limited 25%; others WNL    Right Shoulder Extension --    Right Shoulder Flexion 175 Degrees    Right Shoulder  ABduction 174 Degrees    Right Shoulder Internal Rotation --    Right Shoulder External Rotation --    Left Shoulder Extension --    Left Shoulder Flexion --    Left Shoulder ABduction --    Left Shoulder Internal Rotation --    Left Shoulder External Rotation --      Strength   Overall Strength Within functional limits for tasks performed             LYMPHEDEMA/ONCOLOGY QUESTIONNAIRE - 11/01/19 0001      Type   Cancer Type Right breast cancer      Right Upper Extremity Lymphedema   10 cm Proximal to Olecranon Process 33 cm    Olecranon Process 26.1 cm    10 cm Proximal to Ulnar Styloid Process 23 cm    Just Proximal to Ulnar Styloid Process 15.7 cm    Across Hand at [employer] 20.5 cm    At [place] of 2nd Digit 6.5 cm                 Quick Dash - 11/01/19 0001    Open a tight or new jar No difficulty    Do heavy household chores (wash walls, wash floors) No difficulty    Carry a shopping bag or briefcase No difficulty    Wash your back No difficulty    Use a knife to cut food No difficulty    Recreational activities in which you take some force or impact through your arm, shoulder, or hand (golf, hammering, tennis) No difficulty    During the past week, to what extent has your arm, shoulder or hand problem interfered with your normal social activities with family, friends, neighbors, or groups? Slightly    During the past week, to what extent has your arm, shoulder or hand problem limited your work or other regular daily activities Slightly    Arm, shoulder, or hand pain. Mild    Tingling (pins and needles) in your arm, shoulder, or hand None    Difficulty Sleeping No difficulty    DASH Score 6.82 %            Objective measurements completed on examination: See above findings.       [facility] Adult PT Treatment/Exercise - 11/01/19 0001      Self-Care   Self-Care Other Self-Care Comments    Other Self-Care Comments  gave pt script and information for [facility]  to get a 20-30 compression sleeve and gauntlet       Exercises   Exercises Shoulder      Shoulder Exercises: Supine   Other Supine Exercises meekd decompression, supine and standing dowel exercises                   PT Education - 11/01/19 1231    Education Details where to get a prophylactic compression sleeve, reminder of ABC class, meeks decompression and supine and standing dowel rod exercises    Person(s) Educated Patient    Methods Explanation;Demonstration;Handout    Comprehension Verbalized understanding;Returned demonstration               PT Long Term Goals - 11/01/19 1236      PT LONG TERM GOAL #1   Title Patient will demonstrate she has regained full shoulder ROM and function post operatively compared to baselines.    Baseline pt reports she has mild pain in right shoudler    Time 8    Period Weeks    Status Partially Met                  Plan - 11/01/19 1232    Clinical Impression Statement Pt has done well with recovery after surgery and is currently undergoing radiation.  She has full ROM of shoulders, but does have some shoulder pain. She has not been doing any exercises.  She was instructed in postureal and dowel stretches today.  Do not think she needs follow up PT at this time, but she made need some after radiation is complete.  Pt was advised to keep this in mind and ask for a referral at that time if she still has shoulder pain.  She was schedule for [facility] follow up and given information to get a compression sleeve.    Stability/Clinical Decision Making Stable/Uncomplicated    Rehab Potential Excellent    PT Treatment/Interventions ADLs/Self Care Home Management;Therapeutic exercise;Patient/family education    PT Next Visit Plan Reasess as needed or discharge    PT Home Exercise Plan Post op shoulder ROM HEP, postureal exercises and dowel stretches    Consulted and Agree with Plan of Care Patient           Patient will  benefit from skilled therapeutic intervention in order to improve the following deficits and impairments:  Postural dysfunction, Decreased range of motion, Pain, Impaired UE functional use, Decreased knowledge of precautions  Visit Diagnosis: Aftercare following surgery for neoplasm - Plan: PT plan of care cert/re-cert  Abnormal posture - Plan: PT plan of care cert/re-cert  Acute pain of right shoulder - Plan: PT plan of care cert/re-cert     Problem List Patient Active Problem List   Diagnosis Date Noted  . Central sleep apnea 09/20/2019  . Genetic testing 09/10/2019  . BRCA2 gene mutation positive in female 09/10/2019  . Family history of BRCA gene mutation   . Family history of breast cancer   . Family history of ovarian cancer   . Family history of stomach cancer   . Family history of leukemia   . Ductal carcinoma in situ (DCIS) of right breast 08/31/2019  . Chronic insomnia 06/08/2019  . Atypical chest pain 07/20/2018  . Chest discomfort 05/21/2018  . Anxiety 05/21/2018  . Snoring 10/27/2017  . Episodic circadian rhythm sleep disorder, shift work type 10/27/2017  . Insomnia 10/27/2017  . Type 2 diabetes mellitus without complication, without long-term current use of insulin ([place]) 10/27/2017  Donato Heinz. Owens Shark PT  [place]o 11/01/2019, 12:39 PM  [place], [place], [address] Phone: [phone]   Fax:  [phone]  Name: Vanessa Allen MRN: [id number] Date of Birth: [birth year]

## 2019-11-02 ENCOUNTER — Ambulatory Visit
Admission: RE | Admit: 2019-11-02 | Discharge: 2019-11-02 | Disposition: A | Discharge status: Home / Self Care | Payer: BC Managed Care – PPO | Source: Ambulatory Visit | Attending: Radiation Oncology | Admitting: Radiation Oncology

## 2019-11-02 DIAGNOSIS — Z510 Encounter for antineoplastic radiation therapy: Secondary | ICD-10-CM | POA: Diagnosis not present

## 2019-11-02 DIAGNOSIS — Z853 Personal history of malignant neoplasm of breast: Secondary | ICD-10-CM | POA: Diagnosis not present

## 2019-11-02 DIAGNOSIS — D0511 Intraductal carcinoma in situ of right breast: Secondary | ICD-10-CM | POA: Diagnosis not present

## 2019-11-02 DIAGNOSIS — E119 Type 2 diabetes mellitus without complications: Secondary | ICD-10-CM | POA: Insufficient documentation

## 2019-11-02 DIAGNOSIS — K219 Gastro-esophageal reflux disease without esophagitis: Secondary | ICD-10-CM | POA: Insufficient documentation

## 2019-11-03 ENCOUNTER — Ambulatory Visit
Admission: RE | Admit: 2019-11-03 | Discharge: 2019-11-03 | Disposition: A | Discharge status: Home / Self Care | Payer: BC Managed Care – PPO | Source: Ambulatory Visit | Attending: Radiation Oncology | Admitting: Radiation Oncology

## 2019-11-03 DIAGNOSIS — D0511 Intraductal carcinoma in situ of right breast: Secondary | ICD-10-CM | POA: Diagnosis not present

## 2019-11-03 DIAGNOSIS — Z510 Encounter for antineoplastic radiation therapy: Secondary | ICD-10-CM | POA: Diagnosis not present

## 2019-11-04 ENCOUNTER — Encounter: Payer: Self-pay | Admitting: Vascular Surgery

## 2019-11-04 ENCOUNTER — Ambulatory Visit (INDEPENDENT_AMBULATORY_CARE_PROVIDER_SITE_OTHER): Payer: BC Managed Care – PPO | Admitting: Vascular Surgery

## 2019-11-04 ENCOUNTER — Other Ambulatory Visit: Payer: Self-pay

## 2019-11-04 ENCOUNTER — Ambulatory Visit
Admission: RE | Admit: 2019-11-04 | Discharge: 2019-11-04 | Disposition: A | Discharge status: Home / Self Care | Payer: BC Managed Care – PPO | Source: Ambulatory Visit | Attending: Radiation Oncology | Admitting: Radiation Oncology

## 2019-11-04 VITALS — BP 130/93 | HR 105 | Temp 97.4°F | Resp 18 | Ht 67.0 in | Wt 184.4 lb

## 2019-11-04 DIAGNOSIS — M7989 Other specified soft tissue disorders: Secondary | ICD-10-CM

## 2019-11-04 DIAGNOSIS — D0511 Intraductal carcinoma in situ of right breast: Secondary | ICD-10-CM | POA: Diagnosis not present

## 2019-11-04 DIAGNOSIS — I83893 Varicose veins of bilateral lower extremities with other complications: Secondary | ICD-10-CM | POA: Diagnosis not present

## 2019-11-04 DIAGNOSIS — Z510 Encounter for antineoplastic radiation therapy: Secondary | ICD-10-CM | POA: Diagnosis not present

## 2019-11-04 DIAGNOSIS — I872 Venous insufficiency (chronic) (peripheral): Secondary | ICD-10-CM | POA: Diagnosis not present

## 2019-11-04 NOTE — Progress Notes (Signed)
REASON FOR VISIT:   Follow-up of chronic venous insufficiency  MEDICAL ISSUES:   CHRONIC VENOUS INSUFFICIENCY: This patient has CEAP C5 venous disease.  She has deep venous reflux and superficial venous reflux on the left.  The left great saphenous vein is incompetent from the saphenofemoral junction to the distal calf.  Diameters range from 0.74 in the proximal calf to 0.6 in the proximal thigh.  We have discussed the importance of intermittent leg elevation the proper positioning for this.  In addition I encouraged her to continue to wear her compression stockings with a gradient of 20 to 30 mmHg.  I encouraged her to avoid prolonged sitting and standing.  We discussed the importance of exercise specifically walking and water aerobics.  Given that she has had recurrent ulcerations in the left leg for years I think she would benefit from laser ablation of the left great saphenous vein.  I have discussed the indications for endovenous laser ablation of the left GSV, that is to lower the pressure in the veins and potentially help relieve the symptoms from venous hypertension. I have also discussed alternative options including conservative treatment with leg elevation, compression therapy, exercise, avoiding prolonged sitting and standing, and weight management. I have discussed the potential complications of the procedure, including, but not limited to: bleeding, bruising, leg swelling, nerve injury, skin burns, significant pain from phlebitis, deep venous thrombosis, or failure of the vein to close.  I have also explained that venous insufficiency is a chronic disease, and that the patient is at risk for recurrent varicose veins in the future.  All of the patient's questions were encouraged and answered. They are agreeable to proceed.   We will schedule this in the near future.  HPI:   Vanessa Allen is a pleasant 54 y.o. female who has been seen by Dr. Trula Slade on 2 previous occasions.  She  has recurrent venous ulcers of the left leg.  He had seen her back on 09/20/2019 and had recommended leg elevation and compression stockings with a gradient of 20 to 30 mmHg.  He saw her in follow-up with a formal venous reflux study on 10/18/2019 and she had significant superficial venous reflux in the left great saphenous vein.  She was felt to be a good candidate for laser ablation and comes in for a follow-up visit.  She describes significant aching pain and heaviness in her legs which is aggravated by standing and relieved with elevation.  Her symptoms are worse at the end of the day.  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  She has been wearing her compression stockings with a gradient of 20 to 30 mmHg.  She elevates her legs at the end of the day.  She is had no previous venous procedures.  She said no previous history of DVT.  Her compression stockings do help her symptoms some.  Past Medical History:  Diagnosis Date  . Cancer [facility])    Right Breast Cancer  . Diabetes mellitus without complication ([place])   . Family history of BRCA gene mutation   . Family history of breast cancer   . Family history of leukemia   . Family history of ovarian cancer   . Family history of stomach cancer   . GERD (gastroesophageal reflux disease)     Family History  Problem Relation Age of Onset  . Hyperlipidemia Mother   . Hypertension Mother   . Varicose Veins Mother   .  Diabetes Father   . Heart disease Father   . Stomach cancer Father 57  . Hypertension Brother   . Hypertension Daughter   . Leukemia Paternal Aunt        dx. >50  . Breast cancer Niece 12  . BRCA 1/2 Niece   . Ovarian cancer Cousin        d. <50 (maternal first cousin)  . Ovarian cancer Cousin        d. <50 (maternal first cousin)  . Ovarian cancer Cousin        d. late 62s (maternal first cousin)    SOCIAL HISTORY: Social History   Tobacco Use  . Smoking status: Never Smoker  . Smokeless tobacco: Never Used   Substance Use Topics  . Alcohol use: Not Currently    Alcohol/week: 0.0 standard drinks    Comment: occasional    No Known Allergies  Current Outpatient Medications  Medication Sig Dispense Refill  . ACCU-CHEK GUIDE test strip USE TWICE DAILY TO CHECK BLOOD SUGAR BEFORE BREAKFAST AND DINNER 100 strip 5  . acidophilus (RISAQUAD) CAPS capsule Take 1 capsule by mouth daily.    Marland Kitchen ALPRAZolam (XANAX) 0.5 MG tablet Take 1 tablet (0.5 mg total) by mouth 3 (three) times daily as needed for anxiety. 30 tablet 1  . amitriptyline (ELAVIL) 25 MG tablet Take 1 tablet (25 mg total) by mouth at bedtime. 30 tablet 3  . aspirin EC 81 MG tablet Take 81 mg by mouth daily. Swallow whole.    Marland Kitchen atorvastatin (LIPITOR) 10 MG tablet TAKE 1 TABLET(10 MG) BY MOUTH DAILY (Patient taking differently: Take 10 mg by mouth every 7 (seven) days. ) 90 tablet 1  . Biotin w/ Vitamins C & E (HAIR/SKIN/NAILS PO) Take 1 tablet by mouth daily.    . Coenzyme Q10 (CO Q-10) 100 MG CAPS Take by mouth.    . Continuous Blood Gluc Receiver (FREESTYLE LIBRE 14 DAY READER) DEVI USE TO CHECK BLOOD SUGAR BEFORE MEALS AND AT BEDTIME 1 each 2  . Continuous Blood Gluc Sensor (FREESTYLE LIBRE 14 DAY SENSOR) MISC USE TO MEASURE BLOOD GLUCOSE FOUR TIMES DAILY BEFORE MEALS AND AT BEDTIME 2 each 11  . cyclobenzaprine (FLEXERIL) 10 MG tablet Take 10 mg by mouth at bedtime as needed for muscle spasms.     . DAYVIGO 10 MG TABS Take by mouth.     . esomeprazole (NEXIUM) 20 MG capsule Take 1 capsule (20 mg total) by mouth daily. 90 capsule 1  . estradiol (VIVELLE-DOT) 0.025 MG/24HR APPLY TO SKIN TWICE WEEKLY AS DIRECTED 8 patch 1  . FARXIGA 10 MG TABS tablet Take 10 mg by mouth every morning.     . Glucose Blood (GLUCOSE METER TEST VI) by In Vitro route 2 (two) times daily.    Marland Kitchen ibuprofen (ADVIL) 800 MG tablet Take 1 tablet (800 mg total) by mouth every 8 (eight) hours as needed. 30 tablet 0  . meloxicam (MOBIC) 15 MG tablet Take 15 mg by mouth daily  as needed for pain.     . metFORMIN (GLUCOPHAGE) 500 MG tablet TAKE 1 TABLET BY MOUTH EVERY DAY WITH BREAKFAST (Patient taking differently: Take 500 mg by mouth daily with breakfast. TAKE 1 TABLET BY MOUTH EVERY DAY WITH BREAKFAST) 90 tablet 1  . Multiple Vitamin (MULTIVITAMIN WITH MINERALS) TABS tablet Take 1 tablet by mouth daily.    . Omega-3 1000 MG CAPS Take 1,000 mg by mouth daily.    . ondansetron (ZOFRAN) 4  MG tablet Take 1 tablet (4 mg total) by mouth daily as needed for nausea or vomiting. 30 tablet 1  . oxyCODONE (OXY IR/ROXICODONE) 5 MG immediate release tablet Take 1 tablet (5 mg total) by mouth every 6 (six) hours as needed for severe pain. 15 tablet 0  . progesterone (PROMETRIUM) 100 MG capsule TAKE 2 CAPSULES BY MOUTH EVERY NIGHT AT BEDTIME FOR 2 WEEKS, THEN 3 CAPSULES EVERY NIGHT AT BEDTIME IF NEEDED. TAKE ON DAY 1 TO 27 OF EACH MONTH 81 capsule 1  . Semaglutide (RYBELSUS) 14 MG TABS Take 1 tablet by mouth daily. Take 30 minutes before breakfast 30 tablet 3  . Suvorexant (BELSOMRA) 10 MG TABS Take 1 tablet by mouth at bedtime as needed. 30 tablet 0   No current facility-administered medications for this visit.    REVIEW OF SYSTEMS:  [X] denotes positive finding, [ ] denotes negative finding Cardiac  Comments:  Chest pain or chest pressure:    Shortness of breath upon exertion:    Short of breath when lying flat:    Irregular heart rhythm:        Vascular    Pain in calf, thigh, or hip brought on by ambulation:    Pain in feet at night that wakes you up from your sleep:     Blood clot in your veins:    Leg swelling:  x       Pulmonary    Oxygen at home:    Productive cough:     Wheezing:         Neurologic    Sudden weakness in arms or legs:     Sudden numbness in arms or legs:     Sudden onset of difficulty speaking or slurred speech:    Temporary loss of vision in one eye:     Problems with dizziness:         Gastrointestinal    Blood in stool:     Vomited  blood:         Genitourinary    Burning when urinating:     Blood in urine:        Psychiatric    Major depression:         Hematologic    Bleeding problems:    Problems with blood clotting too easily:        Skin    Rashes or ulcers: x       Constitutional    Fever or chills:     PHYSICAL EXAM:   Vitals:   11/04/19 1245  BP: (!) 130/93  Pulse: (!) 105  Resp: 18  Temp: (!) 97.4 F (36.3 C)  TempSrc: Temporal  SpO2: 98%  Weight: 184 lb 6.4 oz (83.6 kg)  Height: 5' 7" (1.702 m)    GENERAL: The patient is a well-nourished female, in no acute distress. The vital signs are documented above. CARDIAC: There is a regular rate and rhythm.  VASCULAR: I do not detect carotid bruits. She has palpable dorsalis pedis pulses. She has significant hyperpigmentation of the left medial malleolus where she has had previous venous ulcers.   I did look at her left great saphenous vein myself with the SonoSite and this is significantly dilated from the distal thigh to the saphenofemoral junction.  We can potentially cannulate the vein in the proximal calf as she has reflux all the way down to the mid calf. PULMONARY: There is good air exchange bilaterally without wheezing or rales. ABDOMEN: Soft  and non-tender with normal pitched bowel sounds.  MUSCULOSKELETAL: There are no major deformities or cyanosis. NEUROLOGIC: No focal weakness or paresthesias are detected. SKIN: There are no ulcers or rashes noted. PSYCHIATRIC: The patient has a normal affect.  DATA:    VENOUS DUPLEX: I reviewed her venous duplex scan.  This was of the left leg only.  There is no evidence of DVT or superficial venous thrombosis.  There is deep venous reflux involving the common femoral vein.  There is superficial venous reflux in the left great saphenous vein from the saphenofemoral junction to the mid calf.  Diameters of the vein from the mid calf to the proximal thigh are 0.46-0.74-0.55-0.5-0.55-0.6.  A total  of 30 minutes was spent on this visit. 16 minutes was face to face time. More than 50% of the time was spent on counseling and coordinating with the patient.    Deitra Mayo [facility] [phone]

## 2019-11-05 ENCOUNTER — Ambulatory Visit
Admission: RE | Admit: 2019-11-05 | Discharge: 2019-11-05 | Disposition: A | Discharge status: Home / Self Care | Payer: BC Managed Care – PPO | Source: Ambulatory Visit | Attending: Radiation Oncology | Admitting: Radiation Oncology

## 2019-11-05 DIAGNOSIS — Z510 Encounter for antineoplastic radiation therapy: Secondary | ICD-10-CM | POA: Diagnosis not present

## 2019-11-05 DIAGNOSIS — D0511 Intraductal carcinoma in situ of right breast: Secondary | ICD-10-CM | POA: Diagnosis not present

## 2019-11-08 ENCOUNTER — Ambulatory Visit
Admission: RE | Admit: 2019-11-08 | Discharge: 2019-11-08 | Disposition: A | Discharge status: Home / Self Care | Payer: BC Managed Care – PPO | Source: Ambulatory Visit | Attending: Radiation Oncology | Admitting: Radiation Oncology

## 2019-11-08 DIAGNOSIS — D0511 Intraductal carcinoma in situ of right breast: Secondary | ICD-10-CM | POA: Diagnosis not present

## 2019-11-08 DIAGNOSIS — Z510 Encounter for antineoplastic radiation therapy: Secondary | ICD-10-CM | POA: Diagnosis not present

## 2019-11-09 ENCOUNTER — Encounter: Payer: Self-pay | Admitting: Vascular Surgery

## 2019-11-09 ENCOUNTER — Ambulatory Visit
Admission: RE | Admit: 2019-11-09 | Discharge: 2019-11-09 | Disposition: A | Discharge status: Home / Self Care | Payer: BC Managed Care – PPO | Source: Ambulatory Visit | Attending: Radiation Oncology | Admitting: Radiation Oncology

## 2019-11-09 DIAGNOSIS — D0511 Intraductal carcinoma in situ of right breast: Secondary | ICD-10-CM | POA: Diagnosis not present

## 2019-11-09 DIAGNOSIS — Z510 Encounter for antineoplastic radiation therapy: Secondary | ICD-10-CM | POA: Diagnosis not present

## 2019-11-10 ENCOUNTER — Other Ambulatory Visit: Payer: Self-pay | Admitting: *Deleted

## 2019-11-10 ENCOUNTER — Ambulatory Visit
Admission: RE | Admit: 2019-11-10 | Discharge: 2019-11-10 | Disposition: A | Discharge status: Home / Self Care | Payer: BC Managed Care – PPO | Source: Ambulatory Visit | Attending: Radiation Oncology | Admitting: Radiation Oncology

## 2019-11-10 DIAGNOSIS — I83812 Varicose veins of left lower extremities with pain: Secondary | ICD-10-CM

## 2019-11-10 DIAGNOSIS — D0511 Intraductal carcinoma in situ of right breast: Secondary | ICD-10-CM | POA: Diagnosis not present

## 2019-11-10 DIAGNOSIS — Z510 Encounter for antineoplastic radiation therapy: Secondary | ICD-10-CM | POA: Diagnosis not present

## 2019-11-11 ENCOUNTER — Ambulatory Visit
Admission: RE | Admit: 2019-11-11 | Discharge: 2019-11-11 | Disposition: A | Discharge status: Home / Self Care | Payer: BC Managed Care – PPO | Source: Ambulatory Visit | Attending: Radiation Oncology | Admitting: Radiation Oncology

## 2019-11-11 DIAGNOSIS — D259 Leiomyoma of uterus, unspecified: Secondary | ICD-10-CM | POA: Diagnosis not present

## 2019-11-11 DIAGNOSIS — D0511 Intraductal carcinoma in situ of right breast: Secondary | ICD-10-CM | POA: Diagnosis not present

## 2019-11-11 DIAGNOSIS — Z510 Encounter for antineoplastic radiation therapy: Secondary | ICD-10-CM | POA: Diagnosis not present

## 2019-11-12 ENCOUNTER — Ambulatory Visit
Admission: RE | Admit: 2019-11-12 | Discharge: 2019-11-12 | Disposition: A | Discharge status: Home / Self Care | Payer: BC Managed Care – PPO | Source: Ambulatory Visit | Attending: Radiation Oncology | Admitting: Radiation Oncology

## 2019-11-12 DIAGNOSIS — D0511 Intraductal carcinoma in situ of right breast: Secondary | ICD-10-CM | POA: Diagnosis not present

## 2019-11-12 DIAGNOSIS — Z510 Encounter for antineoplastic radiation therapy: Secondary | ICD-10-CM | POA: Diagnosis not present

## 2019-11-15 ENCOUNTER — Ambulatory Visit
Admission: RE | Admit: 2019-11-15 | Discharge: 2019-11-15 | Disposition: A | Discharge status: Home / Self Care | Payer: BC Managed Care – PPO | Source: Ambulatory Visit | Attending: Radiation Oncology | Admitting: Radiation Oncology

## 2019-11-15 DIAGNOSIS — Z510 Encounter for antineoplastic radiation therapy: Secondary | ICD-10-CM | POA: Diagnosis not present

## 2019-11-15 DIAGNOSIS — D0511 Intraductal carcinoma in situ of right breast: Secondary | ICD-10-CM | POA: Diagnosis not present

## 2019-11-16 ENCOUNTER — Ambulatory Visit
Admission: RE | Admit: 2019-11-16 | Discharge: 2019-11-16 | Disposition: A | Discharge status: Home / Self Care | Payer: BC Managed Care – PPO | Source: Ambulatory Visit | Attending: Radiation Oncology | Admitting: Radiation Oncology

## 2019-11-16 DIAGNOSIS — D0511 Intraductal carcinoma in situ of right breast: Secondary | ICD-10-CM | POA: Diagnosis not present

## 2019-11-16 DIAGNOSIS — Z510 Encounter for antineoplastic radiation therapy: Secondary | ICD-10-CM | POA: Diagnosis not present

## 2019-11-17 ENCOUNTER — Ambulatory Visit
Admission: RE | Admit: 2019-11-17 | Discharge: 2019-11-17 | Disposition: A | Discharge status: Home / Self Care | Payer: BC Managed Care – PPO | Source: Ambulatory Visit | Attending: Radiation Oncology | Admitting: Radiation Oncology

## 2019-11-17 DIAGNOSIS — Z510 Encounter for antineoplastic radiation therapy: Secondary | ICD-10-CM | POA: Diagnosis not present

## 2019-11-17 DIAGNOSIS — D0511 Intraductal carcinoma in situ of right breast: Secondary | ICD-10-CM | POA: Diagnosis not present

## 2019-11-18 ENCOUNTER — Ambulatory Visit
Admission: RE | Admit: 2019-11-18 | Discharge: 2019-11-18 | Disposition: A | Discharge status: Home / Self Care | Payer: BC Managed Care – PPO | Source: Ambulatory Visit | Attending: Radiation Oncology | Admitting: Radiation Oncology

## 2019-11-18 DIAGNOSIS — Z510 Encounter for antineoplastic radiation therapy: Secondary | ICD-10-CM | POA: Diagnosis not present

## 2019-11-18 DIAGNOSIS — D0511 Intraductal carcinoma in situ of right breast: Secondary | ICD-10-CM | POA: Diagnosis not present

## 2019-11-19 ENCOUNTER — Ambulatory Visit
Admission: RE | Admit: 2019-11-19 | Discharge: 2019-11-19 | Disposition: A | Discharge status: Home / Self Care | Payer: BC Managed Care – PPO | Source: Ambulatory Visit | Attending: Radiation Oncology | Admitting: Radiation Oncology

## 2019-11-19 DIAGNOSIS — Z510 Encounter for antineoplastic radiation therapy: Secondary | ICD-10-CM | POA: Diagnosis not present

## 2019-11-19 DIAGNOSIS — D0511 Intraductal carcinoma in situ of right breast: Secondary | ICD-10-CM | POA: Diagnosis not present

## 2019-11-22 ENCOUNTER — Ambulatory Visit
Admission: RE | Admit: 2019-11-22 | Discharge: 2019-11-22 | Disposition: A | Discharge status: Home / Self Care | Payer: BC Managed Care – PPO | Source: Ambulatory Visit | Attending: Radiation Oncology | Admitting: Radiation Oncology

## 2019-11-22 DIAGNOSIS — D0511 Intraductal carcinoma in situ of right breast: Secondary | ICD-10-CM | POA: Diagnosis not present

## 2019-11-22 DIAGNOSIS — Z510 Encounter for antineoplastic radiation therapy: Secondary | ICD-10-CM | POA: Diagnosis not present

## 2019-11-23 ENCOUNTER — Ambulatory Visit
Admission: RE | Admit: 2019-11-23 | Discharge: 2019-11-23 | Disposition: A | Discharge status: Home / Self Care | Payer: BC Managed Care – PPO | Source: Ambulatory Visit | Attending: Radiation Oncology | Admitting: Radiation Oncology

## 2019-11-23 DIAGNOSIS — Z510 Encounter for antineoplastic radiation therapy: Secondary | ICD-10-CM | POA: Diagnosis not present

## 2019-11-23 DIAGNOSIS — D0511 Intraductal carcinoma in situ of right breast: Secondary | ICD-10-CM | POA: Diagnosis not present

## 2019-11-24 ENCOUNTER — Ambulatory Visit
Admission: RE | Admit: 2019-11-24 | Discharge: 2019-11-24 | Disposition: A | Discharge status: Home / Self Care | Payer: BC Managed Care – PPO | Source: Ambulatory Visit | Attending: Radiation Oncology | Admitting: Radiation Oncology

## 2019-11-24 DIAGNOSIS — D0511 Intraductal carcinoma in situ of right breast: Secondary | ICD-10-CM | POA: Diagnosis not present

## 2019-11-24 DIAGNOSIS — Z510 Encounter for antineoplastic radiation therapy: Secondary | ICD-10-CM | POA: Diagnosis not present

## 2019-11-25 ENCOUNTER — Ambulatory Visit (INDEPENDENT_AMBULATORY_CARE_PROVIDER_SITE_OTHER): Payer: BC Managed Care – PPO | Admitting: Psychology

## 2019-11-25 ENCOUNTER — Ambulatory Visit
Admission: RE | Admit: 2019-11-25 | Discharge: 2019-11-25 | Disposition: A | Discharge status: Home / Self Care | Payer: BC Managed Care – PPO | Source: Ambulatory Visit | Attending: Radiation Oncology | Admitting: Radiation Oncology

## 2019-11-25 DIAGNOSIS — C50919 Malignant neoplasm of unspecified site of unspecified female breast: Secondary | ICD-10-CM | POA: Diagnosis not present

## 2019-11-25 DIAGNOSIS — D0511 Intraductal carcinoma in situ of right breast: Secondary | ICD-10-CM | POA: Diagnosis not present

## 2019-11-25 DIAGNOSIS — F5101 Primary insomnia: Secondary | ICD-10-CM

## 2019-11-25 DIAGNOSIS — Z510 Encounter for antineoplastic radiation therapy: Secondary | ICD-10-CM | POA: Diagnosis not present

## 2019-11-26 ENCOUNTER — Ambulatory Visit: Payer: BC Managed Care – PPO | Admitting: Radiation Oncology

## 2019-11-26 ENCOUNTER — Ambulatory Visit
Admission: RE | Admit: 2019-11-26 | Discharge: 2019-11-26 | Disposition: A | Discharge status: Home / Self Care | Payer: BC Managed Care – PPO | Source: Ambulatory Visit | Attending: Radiation Oncology | Admitting: Radiation Oncology

## 2019-11-26 DIAGNOSIS — D0511 Intraductal carcinoma in situ of right breast: Secondary | ICD-10-CM | POA: Diagnosis not present

## 2019-11-26 DIAGNOSIS — Z510 Encounter for antineoplastic radiation therapy: Secondary | ICD-10-CM | POA: Diagnosis not present

## 2019-11-29 ENCOUNTER — Ambulatory Visit
Admission: RE | Admit: 2019-11-29 | Discharge: 2019-11-29 | Disposition: A | Discharge status: Home / Self Care | Payer: BC Managed Care – PPO | Source: Ambulatory Visit | Attending: Radiation Oncology | Admitting: Radiation Oncology

## 2019-11-29 DIAGNOSIS — Z510 Encounter for antineoplastic radiation therapy: Secondary | ICD-10-CM | POA: Insufficient documentation

## 2019-11-29 DIAGNOSIS — D0511 Intraductal carcinoma in situ of right breast: Secondary | ICD-10-CM | POA: Insufficient documentation

## 2019-11-30 ENCOUNTER — Ambulatory Visit
Admission: RE | Admit: 2019-11-30 | Discharge: 2019-11-30 | Disposition: A | Discharge status: Home / Self Care | Payer: BC Managed Care – PPO | Source: Ambulatory Visit | Attending: Radiation Oncology | Admitting: Radiation Oncology

## 2019-11-30 ENCOUNTER — Other Ambulatory Visit: Payer: Self-pay

## 2019-11-30 DIAGNOSIS — D0511 Intraductal carcinoma in situ of right breast: Secondary | ICD-10-CM | POA: Diagnosis not present

## 2019-11-30 DIAGNOSIS — Z510 Encounter for antineoplastic radiation therapy: Secondary | ICD-10-CM | POA: Diagnosis not present

## 2019-11-30 NOTE — Progress Notes (Signed)
ICD-10-CM   1. Dorsal wrist ganglion  M67.439       Patient ID: Vanessa Allen, female    DOB: Nov 09, 1965, 54 y.o.   MRN: [id number]   History of Present Illness: Vanessa Allen is a 54 y.o.  female  with a history of a ganglion cyst on the dorsal aspect of her left wrist.  She presents for preoperative evaluation for upcoming procedure, excision of left dorsal wrist ganglion cyst, scheduled for 12/13/2019 with Dr. Claudia Desanctis.  Summary from previous visit: Patient has a cyst on the dorsal aspect of her radial left wrist at the 3 4 interval.  Is about 2 to 2.5 cm in size and very prominent..  It has been present for at least 6 months.  Previously the cyst had grown larger and then went away but recurred over the last few months.  It causes pain when bumped at work.  Fingers are well perfused with normal capillary refill.  Palpable radial pulse.  Sensation intact throughout.  Normal ROM in fingers.  Patient is Left-handed.  Job: ER nurse  PMH Significant for: Diabetes, right breast cancer  The patient has had problems with anesthesia. Post-op Nausea after breast cancer surgery  Past Medical History: Allergies: No Known Allergies  Current Medications:  Current Outpatient Medications:  .  ACCU-CHEK GUIDE test strip, USE TWICE DAILY TO CHECK BLOOD SUGAR BEFORE BREAKFAST AND DINNER, Disp: 100 strip, Rfl: 5 .  acidophilus (RISAQUAD) CAPS capsule, Take 1 capsule by mouth daily., Disp: , Rfl:  .  ALPRAZolam (XANAX) 0.5 MG tablet, Take 1 tablet (0.5 mg total) by mouth 3 (three) times daily as needed for anxiety., Disp: 30 tablet, Rfl: 1 .  amitriptyline (ELAVIL) 25 MG tablet, Take 1 tablet (25 mg total) by mouth at bedtime., Disp: 30 tablet, Rfl: 3 .  aspirin EC 81 MG tablet, Take 81 mg by mouth daily. Swallow whole., Disp: , Rfl:  .  atorvastatin (LIPITOR) 10 MG tablet, TAKE 1 TABLET(10 MG) BY MOUTH DAILY (Patient taking differently: Take 10 mg by mouth every 7 (seven) days. ), Disp: 90  tablet, Rfl: 1 .  Biotin w/ Vitamins C & E (HAIR/SKIN/NAILS PO), Take 1 tablet by mouth daily., Disp: , Rfl:  .  Coenzyme Q10 (CO Q-10) 100 MG CAPS, Take by mouth., Disp: , Rfl:  .  Continuous Blood Gluc Receiver (FREESTYLE LIBRE 14 DAY READER) DEVI, USE TO CHECK BLOOD SUGAR BEFORE MEALS AND AT BEDTIME, Disp: 1 each, Rfl: 2 .  Continuous Blood Gluc Sensor (FREESTYLE LIBRE 14 DAY SENSOR) MISC, USE TO MEASURE BLOOD GLUCOSE FOUR TIMES DAILY BEFORE MEALS AND AT BEDTIME, Disp: 2 each, Rfl: 11 .  cyclobenzaprine (FLEXERIL) 10 MG tablet, Take 10 mg by mouth at bedtime as needed for muscle spasms. , Disp: , Rfl:  .  DAYVIGO 10 MG TABS, Take by mouth. , Disp: , Rfl:  .  esomeprazole (NEXIUM) 20 MG capsule, Take 1 capsule (20 mg total) by mouth daily., Disp: 90 capsule, Rfl: 1 .  estradiol (VIVELLE-DOT) 0.025 MG/24HR, APPLY TO SKIN TWICE WEEKLY AS DIRECTED, Disp: 8 patch, Rfl: 1 .  FARXIGA 10 MG TABS tablet, Take 10 mg by mouth every morning. , Disp: , Rfl:  .  Glucose Blood (GLUCOSE METER TEST VI), by In Vitro route 2 (two) times daily., Disp: , Rfl:  .  ibuprofen (ADVIL) 800 MG tablet, Take 1 tablet (800 mg total) by mouth every 8 (eight) hours as needed., Disp: 30 tablet,  Rfl: 0 .  meloxicam (MOBIC) 15 MG tablet, Take 15 mg by mouth daily as needed for pain. , Disp: , Rfl:  .  metFORMIN (GLUCOPHAGE) 500 MG tablet, TAKE 1 TABLET BY MOUTH EVERY DAY WITH BREAKFAST (Patient taking differently: Take 500 mg by mouth daily with breakfast. TAKE 1 TABLET BY MOUTH EVERY DAY WITH BREAKFAST), Disp: 90 tablet, Rfl: 1 .  Multiple Vitamin (MULTIVITAMIN WITH MINERALS) TABS tablet, Take 1 tablet by mouth daily., Disp: , Rfl:  .  Omega-3 1000 MG CAPS, Take 1,000 mg by mouth daily., Disp: , Rfl:  .  ondansetron (ZOFRAN) 4 MG tablet, Take 1 tablet (4 mg total) by mouth daily as needed for nausea or vomiting., Disp: 30 tablet, Rfl: 1 .  oxyCODONE (OXY IR/ROXICODONE) 5 MG immediate release tablet, Take 1 tablet (5 mg total) by  mouth every 6 (six) hours as needed for severe pain., Disp: 15 tablet, Rfl: 0 .  progesterone (PROMETRIUM) 100 MG capsule, TAKE 2 CAPSULES BY MOUTH EVERY NIGHT AT BEDTIME FOR 2 WEEKS, THEN 3 CAPSULES EVERY NIGHT AT BEDTIME IF NEEDED. TAKE ON DAY 1 TO 27 OF EACH MONTH, Disp: 81 capsule, Rfl: 1 .  Semaglutide (RYBELSUS) 14 MG TABS, Take 1 tablet by mouth daily. Take 30 minutes before breakfast, Disp: 30 tablet, Rfl: 3 .  Suvorexant (BELSOMRA) 10 MG TABS, Take 1 tablet by mouth at bedtime as needed., Disp: 30 tablet, Rfl: 0  Past Medical Problems: Past Medical History:  Diagnosis Date  . Cancer [facility]    Right Breast Cancer  . Diabetes mellitus without complication ([place])   . Family history of BRCA gene mutation   . Family history of breast cancer   . Family history of leukemia   . Family history of ovarian cancer   . Family history of stomach cancer   . GERD (gastroesophageal reflux disease)     Past Surgical History: Past Surgical History:  Procedure Laterality Date  . APPENDECTOMY    . BREAST LUMPECTOMY WITH RADIOACTIVE SEED AND SENTINEL LYMPH NODE BIOPSY Right 09/23/2019   Procedure: RIGHT BREAST LUMPECTOMY WITH RADIOACTIVE SEED AND SENTINEL LYMPH NODE MAPPING;  Surgeon: Erroll Luna, MD;  Location: [place];  Service: General;  Laterality: Right;  PEC BLOCK  . CHOLECYSTECTOMY    . Lipoma removal Left    Left Lower Back  . MYOMECTOMY    . WISDOM TOOTH EXTRACTION      Social History: Social History   Socioeconomic History  . Marital status: Single    Spouse name: Not on file  . Number of children: Not on file  . Years of education: Not on file  . Highest education level: Not on file  Occupational History  . Not on file  Tobacco Use  . Smoking status: Never Smoker  . Smokeless tobacco: Never Used  Vaping Use  . Vaping Use: Never used  Substance and Sexual Activity  . Alcohol use: Not Currently    Alcohol/week: 0.0 standard drinks    Comment: occasional  . Drug use: No   . Sexual activity: Not on file  Other Topics Concern  . Not on file  Social History Narrative  . Not on file   Social Determinants of Health   Financial Resource Strain:   . Difficulty of Paying Living Expenses: Not on file  Food Insecurity:   . Worried About Charity fundraiser in the Last Year: Not on file  . Ran Out of Food in the Last Year: Not on  file  Transportation Needs:   . Film/video editor (Medical): Not on file  . Lack of Transportation (Non-Medical): Not on file  Physical Activity:   . Days of Exercise per Week: Not on file  . Minutes of Exercise per Session: Not on file  Stress:   . Feeling of Stress : Not on file  Social Connections:   . Frequency of Communication with Friends and Family: Not on file  . Frequency of Social Gatherings with Friends and Family: Not on file  . Attends Religious Services: Not on file  . Active Member of Clubs or Organizations: Not on file  . Attends Archivist Meetings: Not on file  . Marital Status: Not on file  Intimate Partner Violence:   . Fear of Current or Ex-Partner: Not on file  . Emotionally Abused: Not on file  . Physically Abused: Not on file  . Sexually Abused: Not on file    Family History: Family History  Problem Relation Age of Onset  . Hyperlipidemia Mother   . Hypertension Mother   . Varicose Veins Mother   . Diabetes Father   . Heart disease Father   . Stomach cancer Father 53  . Hypertension Brother   . Hypertension Daughter   . Leukemia Paternal Aunt        dx. >50  . Breast cancer Niece 68  . BRCA 1/2 Niece   . Ovarian cancer Cousin        d. <50 (maternal first cousin)  . Ovarian cancer Cousin        d. <50 (maternal first cousin)  . Ovarian cancer Cousin        d. late 45s (maternal first cousin)    Review of Systems: Review of Systems  Constitutional: Negative for chills and fever.  HENT: Negative for congestion and sore throat.   Respiratory: Negative for cough and  shortness of breath.   Cardiovascular: Negative for chest pain and palpitations.  Gastrointestinal: Negative for abdominal pain, nausea and vomiting.  Musculoskeletal: Positive for joint pain (left wrist pain where cyst is).  Skin: Negative for itching and rash.    Physical Exam: Vital Signs BP 138/90 (BP Location: Left Arm, Patient Position: Sitting, Cuff Size: Large)   Pulse 86   Temp 98.1 F (36.7 C) (Temporal)   Ht '5\' 6"'  (1.676 m)   Wt 182 lb (82.6 kg)   SpO2 96%   BMI 29.38 kg/m  Physical Exam Vitals and nursing note reviewed.  Constitutional:      General: She is not in acute distress.    Appearance: Normal appearance. She is normal weight. She is not ill-appearing.  HENT:     Head: Normocephalic and atraumatic.  Eyes:     Extraocular Movements: Extraocular movements intact.  Cardiovascular:     Rate and Rhythm: Normal rate and regular rhythm.     Pulses: Normal pulses.     Heart sounds: Normal heart sounds.  Pulmonary:     Effort: Pulmonary effort is normal.     Breath sounds: Normal breath sounds. No wheezing, rhonchi or rales.  Abdominal:     General: Bowel sounds are normal.     Palpations: Abdomen is soft.  Musculoskeletal:        General: No swelling. Normal range of motion.     Left wrist: Normal range of motion. Normal pulse.     Cervical back: Normal range of motion.     Comments: Mass on dorsal aspect of left  wrist.   Skin:    General: Skin is warm and dry.     Coloration: Skin is not pale.     Findings: No erythema or rash.  Neurological:     General: No focal deficit present.     Mental Status: She is alert and oriented to person, place, and time.  Psychiatric:        Mood and Affect: Mood normal.        Behavior: Behavior normal.        Thought Content: Thought content normal.        Judgment: Judgment normal.     Assessment/Plan:  Ms. Sender scheduled for excision of left dorsal wrist ganglion cyst with Dr. Claudia Desanctis.  Risks, benefits, and  alternatives of procedure discussed, questions answered and consent obtained.    Smoking Status: non-smoker; Counseling Given? N/A  Caprini Score: 5 High; Risk Factors include: 54 year old female, Hx breast cancer, HRT, BMI > 25, and length of planned surgery. Recommendation for mechanical and pharmacological prophylaxis during surgery. Encourage early ambulation.   Post-op Rx sent to pharmacy: Norco, Zofran  Patient was provided with the General Surgical Risk consent document and Pain Medication Agreement prior to their appointment.  They had adequate time to read through the risk consent documents and Pain Medication Agreement. We also discussed them in person together during this preop appointment. All of their questions were answered to their satisfaction.  Recommended calling if they have any further questions.  Risk consent form and Pain Medication Agreement to be scanned into patient's chart.  Electronically signed by: Threasa Heads, PA-C 12/01/2019 9:45 AM

## 2019-11-30 NOTE — H&P (View-Only) (Signed)
ICD-10-CM   1. Dorsal wrist ganglion  M67.439       Patient ID: Vanessa Allen, female    DOB: [birth year], 54 y.o.   MRN: [id number]   History of Present Illness: Vanessa Allen is a 54 y.o.  female  with a history of a ganglion cyst on the dorsal aspect of her left wrist.  She presents for preoperative evaluation for upcoming procedure, excision of left dorsal wrist ganglion cyst, scheduled for 12/13/2019 with Dr. Claudia Desanctis.  Summary from previous visit: Patient has a cyst on the dorsal aspect of her radial left wrist at the 3 4 interval.  Is about 2 to 2.5 cm in size and very prominent..  It has been present for at least 6 months.  Previously the cyst had grown larger and then went away but recurred over the last few months.  It causes pain when bumped at work.  Fingers are well perfused with normal capillary refill.  Palpable radial pulse.  Sensation intact throughout.  Normal ROM in fingers.  Patient is Left-handed.  Job: ER nurse  PMH Significant for: Diabetes, right breast cancer  The patient has had problems with anesthesia. Post-op Nausea after breast cancer surgery  Past Medical History: Allergies: No Known Allergies  Current Medications:  Current Outpatient Medications:  .  ACCU-CHEK GUIDE test strip, USE TWICE DAILY TO CHECK BLOOD SUGAR BEFORE BREAKFAST AND DINNER, Disp: 100 strip, Rfl: 5 .  acidophilus (RISAQUAD) CAPS capsule, Take 1 capsule by mouth daily., Disp: , Rfl:  .  ALPRAZolam (XANAX) 0.5 MG tablet, Take 1 tablet (0.5 mg total) by mouth 3 (three) times daily as needed for anxiety., Disp: 30 tablet, Rfl: 1 .  amitriptyline (ELAVIL) 25 MG tablet, Take 1 tablet (25 mg total) by mouth at bedtime., Disp: 30 tablet, Rfl: 3 .  aspirin EC 81 MG tablet, Take 81 mg by mouth daily. Swallow whole., Disp: , Rfl:  .  atorvastatin (LIPITOR) 10 MG tablet, TAKE 1 TABLET(10 MG) BY MOUTH DAILY (Patient taking differently: Take 10 mg by mouth every 7 (seven) days. ), Disp: 90  tablet, Rfl: 1 .  Biotin w/ Vitamins C & E (HAIR/SKIN/NAILS PO), Take 1 tablet by mouth daily., Disp: , Rfl:  .  Coenzyme Q10 (CO Q-10) 100 MG CAPS, Take by mouth., Disp: , Rfl:  .  Continuous Blood Gluc Receiver (FREESTYLE LIBRE 14 DAY READER) DEVI, USE TO CHECK BLOOD SUGAR BEFORE MEALS AND AT BEDTIME, Disp: 1 each, Rfl: 2 .  Continuous Blood Gluc Sensor (FREESTYLE LIBRE 14 DAY SENSOR) MISC, USE TO MEASURE BLOOD GLUCOSE FOUR TIMES DAILY BEFORE MEALS AND AT BEDTIME, Disp: 2 each, Rfl: 11 .  cyclobenzaprine (FLEXERIL) 10 MG tablet, Take 10 mg by mouth at bedtime as needed for muscle spasms. , Disp: , Rfl:  .  DAYVIGO 10 MG TABS, Take by mouth. , Disp: , Rfl:  .  esomeprazole (NEXIUM) 20 MG capsule, Take 1 capsule (20 mg total) by mouth daily., Disp: 90 capsule, Rfl: 1 .  estradiol (VIVELLE-DOT) 0.025 MG/24HR, APPLY TO SKIN TWICE WEEKLY AS DIRECTED, Disp: 8 patch, Rfl: 1 .  FARXIGA 10 MG TABS tablet, Take 10 mg by mouth every morning. , Disp: , Rfl:  .  Glucose Blood (GLUCOSE METER TEST VI), by In Vitro route 2 (two) times daily., Disp: , Rfl:  .  ibuprofen (ADVIL) 800 MG tablet, Take 1 tablet (800 mg total) by mouth every 8 (eight) hours as needed., Disp: 30 tablet,  Rfl: 0 .  meloxicam (MOBIC) 15 MG tablet, Take 15 mg by mouth daily as needed for pain. , Disp: , Rfl:  .  metFORMIN (GLUCOPHAGE) 500 MG tablet, TAKE 1 TABLET BY MOUTH EVERY DAY WITH BREAKFAST (Patient taking differently: Take 500 mg by mouth daily with breakfast. TAKE 1 TABLET BY MOUTH EVERY DAY WITH BREAKFAST), Disp: 90 tablet, Rfl: 1 .  Multiple Vitamin (MULTIVITAMIN WITH MINERALS) TABS tablet, Take 1 tablet by mouth daily., Disp: , Rfl:  .  Omega-3 1000 MG CAPS, Take 1,000 mg by mouth daily., Disp: , Rfl:  .  ondansetron (ZOFRAN) 4 MG tablet, Take 1 tablet (4 mg total) by mouth daily as needed for nausea or vomiting., Disp: 30 tablet, Rfl: 1 .  oxyCODONE (OXY IR/ROXICODONE) 5 MG immediate release tablet, Take 1 tablet (5 mg total) by  mouth every 6 (six) hours as needed for severe pain., Disp: 15 tablet, Rfl: 0 .  progesterone (PROMETRIUM) 100 MG capsule, TAKE 2 CAPSULES BY MOUTH EVERY NIGHT AT BEDTIME FOR 2 WEEKS, THEN 3 CAPSULES EVERY NIGHT AT BEDTIME IF NEEDED. TAKE ON DAY 1 TO 27 OF EACH MONTH, Disp: 81 capsule, Rfl: 1 .  Semaglutide (RYBELSUS) 14 MG TABS, Take 1 tablet by mouth daily. Take 30 minutes before breakfast, Disp: 30 tablet, Rfl: 3 .  Suvorexant (BELSOMRA) 10 MG TABS, Take 1 tablet by mouth at bedtime as needed., Disp: 30 tablet, Rfl: 0  Past Medical Problems: Past Medical History:  Diagnosis Date  . Cancer [facility])    Right Breast Cancer  . Diabetes mellitus without complication ([place])   . Family history of BRCA gene mutation   . Family history of breast cancer   . Family history of leukemia   . Family history of ovarian cancer   . Family history of stomach cancer   . GERD (gastroesophageal reflux disease)     Past Surgical History: Past Surgical History:  Procedure Laterality Date  . APPENDECTOMY    . BREAST LUMPECTOMY WITH RADIOACTIVE SEED AND SENTINEL LYMPH NODE BIOPSY Right 09/23/2019   Procedure: RIGHT BREAST LUMPECTOMY WITH RADIOACTIVE SEED AND SENTINEL LYMPH NODE MAPPING;  Surgeon: Erroll Luna, MD;  Location: [place];  Service: General;  Laterality: Right;  PEC BLOCK  . CHOLECYSTECTOMY    . Lipoma removal Left    Left Lower Back  . MYOMECTOMY    . WISDOM TOOTH EXTRACTION      Social History: Social History   Socioeconomic History  . Marital status: Single    Spouse name: Not on file  . Number of children: Not on file  . Years of education: Not on file  . Highest education level: Not on file  Occupational History  . Not on file  Tobacco Use  . Smoking status: Never Smoker  . Smokeless tobacco: Never Used  Vaping Use  . Vaping Use: Never used  Substance and Sexual Activity  . Alcohol use: Not Currently    Alcohol/week: 0.0 standard drinks    Comment: occasional  . Drug use: No    . Sexual activity: Not on file  Other Topics Concern  . Not on file  Social History Narrative  . Not on file   Social Determinants of Health   Financial Resource Strain:   . Difficulty of Paying Living Expenses: Not on file  Food Insecurity:   . Worried About Charity fundraiser in the Last Year: Not on file  . Ran Out of Food in the Last Year: Not  on file  Transportation Needs:   . Lack of Transportation (Medical): Not on file  . Lack of Transportation (Non-Medical): Not on file  Physical Activity:   . Days of Exercise per Week: Not on file  . Minutes of Exercise per Session: Not on file  Stress:   . Feeling of Stress : Not on file  Social Connections:   . Frequency of Communication with Friends and Family: Not on file  . Frequency of Social Gatherings with Friends and Family: Not on file  . Attends Religious Services: Not on file  . Active Member of Clubs or Organizations: Not on file  . Attends Archivist Meetings: Not on file  . Marital Status: Not on file  Intimate Partner Violence:   . Fear of Current or Ex-Partner: Not on file  . Emotionally Abused: Not on file  . Physically Abused: Not on file  . Sexually Abused: Not on file    Family History: Family History  Problem Relation Age of Onset  . Hyperlipidemia Mother   . Hypertension Mother   . Varicose Veins Mother   . Diabetes Father   . Heart disease Father   . Stomach cancer Father 67  . Hypertension Brother   . Hypertension Daughter   . Leukemia Paternal Aunt        dx. >50  . Breast cancer Niece 80  . BRCA 1/2 Niece   . Ovarian cancer Cousin        d. <50 (maternal first cousin)  . Ovarian cancer Cousin        d. <50 (maternal first cousin)  . Ovarian cancer Cousin        d. late 35s (maternal first cousin)    Review of Systems: Review of Systems  Constitutional: Negative for chills and fever.  HENT: Negative for congestion and sore throat.   Respiratory: Negative for cough and  shortness of breath.   Cardiovascular: Negative for chest pain and palpitations.  Gastrointestinal: Negative for abdominal pain, nausea and vomiting.  Musculoskeletal: Positive for joint pain (left wrist pain where cyst is).  Skin: Negative for itching and rash.    Physical Exam: Vital Signs BP 138/90 (BP Location: Left Arm, Patient Position: Sitting, Cuff Size: Large)   Pulse 86   Temp 98.1 F (36.7 C) (Temporal)   Ht '5\' 6"'  (1.676 m)   Wt 182 lb (82.6 kg)   SpO2 96%   BMI 29.38 kg/m  Physical Exam Vitals and nursing note reviewed.  Constitutional:      General: She is not in acute distress.    Appearance: Normal appearance. She is normal weight. She is not ill-appearing.  HENT:     Head: Normocephalic and atraumatic.  Eyes:     Extraocular Movements: Extraocular movements intact.  Cardiovascular:     Rate and Rhythm: Normal rate and regular rhythm.     Pulses: Normal pulses.     Heart sounds: Normal heart sounds.  Pulmonary:     Effort: Pulmonary effort is normal.     Breath sounds: Normal breath sounds. No wheezing, rhonchi or rales.  Abdominal:     General: Bowel sounds are normal.     Palpations: Abdomen is soft.  Musculoskeletal:        General: No swelling. Normal range of motion.     Left wrist: Normal range of motion. Normal pulse.     Cervical back: Normal range of motion.     Comments: Mass on dorsal aspect of  left wrist.   Skin:    General: Skin is warm and dry.     Coloration: Skin is not pale.     Findings: No erythema or rash.  Neurological:     General: No focal deficit present.     Mental Status: She is alert and oriented to person, place, and time.  Psychiatric:        Mood and Affect: Mood normal.        Behavior: Behavior normal.        Thought Content: Thought content normal.        Judgment: Judgment normal.     Assessment/Plan:  Vanessa Allen scheduled for excision of left dorsal wrist ganglion cyst with Dr. Claudia Desanctis.  Risks, benefits, and  alternatives of procedure discussed, questions answered and consent obtained.    Smoking Status: non-smoker; Counseling Given? N/A  Caprini Score: 5 High; Risk Factors include: 54 year old female, Hx breast cancer, HRT, BMI > 25, and length of planned surgery. Recommendation for mechanical and pharmacological prophylaxis during surgery. Encourage early ambulation.   Post-op Rx sent to pharmacy: Norco, Zofran  Patient was provided with the General Surgical Risk consent document and Pain Medication Agreement prior to their appointment.  They had adequate time to read through the risk consent documents and Pain Medication Agreement. We also discussed them in person together during this preop appointment. All of their questions were answered to their satisfaction.  Recommended calling if they have any further questions.  Risk consent form and Pain Medication Agreement to be scanned into patient's chart.  Electronically signed by: Threasa Heads, PA-C 12/01/2019 9:45 AM

## 2019-12-01 ENCOUNTER — Ambulatory Visit (INDEPENDENT_AMBULATORY_CARE_PROVIDER_SITE_OTHER): Payer: BC Managed Care – PPO | Admitting: Plastic Surgery

## 2019-12-01 ENCOUNTER — Other Ambulatory Visit: Payer: Self-pay

## 2019-12-01 ENCOUNTER — Encounter: Payer: Self-pay | Admitting: Plastic Surgery

## 2019-12-01 ENCOUNTER — Telehealth: Payer: Self-pay | Admitting: Adult Health

## 2019-12-01 ENCOUNTER — Ambulatory Visit
Admission: RE | Admit: 2019-12-01 | Discharge: 2019-12-01 | Disposition: A | Discharge status: Home / Self Care | Payer: BC Managed Care – PPO | Source: Ambulatory Visit | Attending: Radiation Oncology | Admitting: Radiation Oncology

## 2019-12-01 ENCOUNTER — Other Ambulatory Visit: Payer: Self-pay | Admitting: Nurse Practitioner

## 2019-12-01 VITALS — BP 138/90 | HR 86 | Temp 98.1°F | Ht 66.0 in | Wt 182.0 lb

## 2019-12-01 DIAGNOSIS — D0511 Intraductal carcinoma in situ of right breast: Secondary | ICD-10-CM | POA: Diagnosis not present

## 2019-12-01 DIAGNOSIS — M67439 Ganglion, unspecified wrist: Secondary | ICD-10-CM

## 2019-12-01 DIAGNOSIS — Z510 Encounter for antineoplastic radiation therapy: Secondary | ICD-10-CM | POA: Diagnosis not present

## 2019-12-01 MED ORDER — ONDANSETRON HCL 4 MG PO TABS: 4.0000 mg | ORAL_TABLET | Freq: Three times a day (TID) | ORAL | 0 refills | Status: DC | PRN

## 2019-12-01 MED ORDER — HYDROCODONE-ACETAMINOPHEN 5-325 MG PO TABS: 1.0000 | ORAL_TABLET | Freq: Three times a day (TID) | ORAL | 0 refills | Status: AC | PRN

## 2019-12-01 NOTE — Telephone Encounter (Signed)
Patient is scheduled for an initial CPAP f/u tomorrow with Jinny Blossom at 1130am. I checked [employer] and [employer] and did not find any information for patient.   I left a message for the patient to call back to see if she has the machine.

## 2019-12-02 ENCOUNTER — Telehealth: Payer: Self-pay | Admitting: *Deleted

## 2019-12-02 ENCOUNTER — Ambulatory Visit: Payer: Self-pay | Admitting: Adult Health

## 2019-12-02 ENCOUNTER — Ambulatory Visit
Admission: RE | Admit: 2019-12-02 | Discharge: 2019-12-02 | Disposition: A | Discharge status: Home / Self Care | Payer: BC Managed Care – PPO | Source: Ambulatory Visit | Attending: Radiation Oncology | Admitting: Radiation Oncology

## 2019-12-02 DIAGNOSIS — D0511 Intraductal carcinoma in situ of right breast: Secondary | ICD-10-CM | POA: Diagnosis not present

## 2019-12-02 DIAGNOSIS — Z510 Encounter for antineoplastic radiation therapy: Secondary | ICD-10-CM | POA: Diagnosis not present

## 2019-12-02 NOTE — Telephone Encounter (Signed)
Called patient again, she did not answer. I left a detailed message explaining if she does not have her cpap machine, there is no need for the appt today. Will await her callback before cancelling the appt today at 1130am.

## 2019-12-02 NOTE — Telephone Encounter (Signed)
Spoke with patient earlier this morning. She stated that she is not scheduled to receive her CPAP machine until Nov 28th. She has been rescheduled to January 2022 with [facility] did not have any openings).   Nothing further needed at time of call.

## 2019-12-02 NOTE — Telephone Encounter (Signed)
Called and spoke with the patient on 11/22 @10 :30 am with Dr Denman George. Patient aware of the address and phone number of the clinic. Patient also aware of the policy for mask and visitors

## 2019-12-03 ENCOUNTER — Ambulatory Visit
Admission: RE | Admit: 2019-12-03 | Discharge: 2019-12-03 | Disposition: A | Discharge status: Home / Self Care | Payer: BC Managed Care – PPO | Source: Ambulatory Visit | Attending: Radiation Oncology | Admitting: Radiation Oncology

## 2019-12-03 DIAGNOSIS — Z510 Encounter for antineoplastic radiation therapy: Secondary | ICD-10-CM | POA: Diagnosis not present

## 2019-12-03 DIAGNOSIS — D0511 Intraductal carcinoma in situ of right breast: Secondary | ICD-10-CM | POA: Diagnosis not present

## 2019-12-06 ENCOUNTER — Encounter: Payer: Self-pay | Admitting: *Deleted

## 2019-12-06 ENCOUNTER — Encounter (HOSPITAL_BASED_OUTPATIENT_CLINIC_OR_DEPARTMENT_OTHER): Payer: Self-pay | Admitting: Plastic Surgery

## 2019-12-06 ENCOUNTER — Other Ambulatory Visit: Payer: Self-pay

## 2019-12-06 ENCOUNTER — Ambulatory Visit
Admission: RE | Admit: 2019-12-06 | Discharge: 2019-12-06 | Disposition: A | Discharge status: Home / Self Care | Payer: BC Managed Care – PPO | Source: Ambulatory Visit | Attending: Radiation Oncology | Admitting: Radiation Oncology

## 2019-12-06 DIAGNOSIS — Z510 Encounter for antineoplastic radiation therapy: Secondary | ICD-10-CM | POA: Diagnosis not present

## 2019-12-06 DIAGNOSIS — D0511 Intraductal carcinoma in situ of right breast: Secondary | ICD-10-CM | POA: Diagnosis not present

## 2019-12-06 MED ORDER — RYBELSUS 14 MG PO TABS
1.0000 | ORAL_TABLET | Freq: Every day | ORAL | 0 refills | Status: DC
Start: 2019-12-06 — End: 2019-12-07

## 2019-12-07 ENCOUNTER — Ambulatory Visit
Admission: RE | Admit: 2019-12-07 | Discharge: 2019-12-07 | Disposition: A | Discharge status: Home / Self Care | Payer: BC Managed Care – PPO | Source: Ambulatory Visit | Attending: Radiation Oncology | Admitting: Radiation Oncology

## 2019-12-07 ENCOUNTER — Encounter: Payer: Self-pay | Admitting: Nurse Practitioner

## 2019-12-07 ENCOUNTER — Other Ambulatory Visit: Payer: Self-pay

## 2019-12-07 DIAGNOSIS — Z510 Encounter for antineoplastic radiation therapy: Secondary | ICD-10-CM | POA: Diagnosis not present

## 2019-12-07 DIAGNOSIS — D0511 Intraductal carcinoma in situ of right breast: Secondary | ICD-10-CM | POA: Diagnosis not present

## 2019-12-07 MED ORDER — RYBELSUS 14 MG PO TABS: 1.0000 | ORAL_TABLET | Freq: Every day | ORAL | 0 refills | Status: DC

## 2019-12-07 NOTE — Progress Notes (Signed)
[place]  [phone] [phone]     ID: Vanessa Allen DOB: [birth year]  MR#: [id number]  NUU#:[id number]  Patient Care Team: [name], [place] as PCP - General (General Practice) Erroll Luna, MD as Consulting Physician (General Surgery) Jaisen Wiltrout, Virgie Dad, MD as Consulting Physician (Oncology) Kyung Rudd, MD as Consulting Physician (Radiation Oncology) Mauro Kaufmann, RN as Oncology Nurse Navigator Rockwell Germany, RN as Oncology Nurse Navigator Everitt Amber, MD as Consulting Physician (Gynecologic Oncology) Chauncey Cruel, MD OTHER MD:  CHIEF COMPLAINT: Ductal carcinoma in situ; BRCA2+  CURRENT TREATMENT: tamoxifen   INTERVAL HISTORY: Vanessa Allen returns today for follow up of her noninvasive breast cancer. She was evaluated in the multidisciplinary breast cancer clinic on 09/01/2019.  Since consultation, she underwent breast MRI on 09/08/2019 showing: breast composition D; 1.5 cm abnormal non-mass enhancement in medial right breast consistent with combination of post-biopsy-related inflammatory enhancement and DCIS; no additional disease.  Genetic testing performed during clinic revealed a pathogenic variant in BRCA2.  She opted to proceed with right lumpectomy on 09/23/2019 under Dr. Brantley Stage. Pathology from the procedure ([id number]) showed: intermediate grade ductal carcinoma in situ involving an intraductal papilloma; margins negative.  Both biopsied lymph nodes were negative for carcinoma (0/2).  She was then referred back to Dr. Lisbeth Renshaw on 10/14/2019 to discuss radiation therapy. She subsequently received treatment starting on 10/25/2019. She had her final treatment this morning 1110.   REVIEW OF SYSTEMS: Vanessa Allen is very fatigued from the radiation treatments. 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 Her skin is  healing. She did have some desquamation. She is planning to have bilateral salpingo-oophorectomy sometime before the end of this year. A detailed review of systems otherwise was stable today   COVID 19 VACCINATION STATUS: Pfizer x2 most recently January 2021   HISTORY OF CURRENT ILLNESS: From the original intake note:  Vanessa Allen had routine screening mammography on 07/23/2019 showing a possible abnormality in the right breast. She underwent right diagnostic mammography with tomography at [facility] on 08/19/2019 showing: breast density category C; indeterminate 1.9 cm medial right breast calcifications.  Accordingly on 08/25/2019 she proceeded to biopsy of the right breast area in question. The pathology from this procedure ([id number]) showed: ductal carcinoma involving a papillary lesion. There is at least intermediate grade ductal carcinoma in situ, however, the lesion is fragmented hampering assessment and invasion can not be excluded. Lymph node sampling at the time of excision is recommended. Prognostic indicators significant for: estrogen receptor, 90% positive and progesterone receptor, 90% positive, both with strong staining intensity.  Right axilla ultrasound was performed on 08/31/2019 showing no abnormal-appearing lymph nodes.   The patient's subsequent history is as detailed below.   PAST MEDICAL HISTORY: Past Medical History:  Diagnosis Date  . Cancer [facility])    Right Breast Cancer  . Complication of anesthesia   . Diabetes mellitus without complication ([place])   . Family history of BRCA gene mutation   . Family history of breast cancer   . Family history of leukemia   . Family history of ovarian cancer   . Family history of stomach cancer   . GERD (gastroesophageal reflux disease)   . PONV (postoperative nausea and vomiting)     PAST SURGICAL HISTORY: Past Surgical History:  Procedure Laterality Date  . APPENDECTOMY    .  BREAST LUMPECTOMY WITH RADIOACTIVE  SEED AND SENTINEL LYMPH NODE BIOPSY Right 09/23/2019   Procedure: RIGHT BREAST LUMPECTOMY WITH RADIOACTIVE SEED AND SENTINEL LYMPH NODE MAPPING;  Surgeon: Erroll Luna, MD;  Location: [facility];  Service: General;  Laterality: Right;  PEC BLOCK  . CHOLECYSTECTOMY    . Lipoma removal Left    Left Lower Back  . MYOMECTOMY    . WISDOM TOOTH EXTRACTION      FAMILY HISTORY: Family History  Problem Relation Age of Onset  . Hyperlipidemia Mother   . Hypertension Mother   . Varicose Veins Mother   . Diabetes Father   . Heart disease Father   . Stomach cancer Father 35  . Hypertension Brother   . Hypertension Daughter   . Leukemia Paternal Aunt        dx. >50  . Breast cancer Niece 10  . BRCA 1/2 Niece   . Ovarian cancer Cousin        d. <50 (maternal first cousin)  . Ovarian cancer Cousin        d. <50 (maternal first cousin)  . Ovarian cancer Cousin        d. late 92s (maternal first cousin)   Her father died at age 16 after being diagnosed with stomach cancer. Her mother is age 31 (as of 08/2019). Vanessa Allen has 3 brothers and 2 sisters. She reports ovarian cancer in two maternal aunts and breast cancer in a niece (brother's daughter) at age 48.   GYNECOLOGIC HISTORY:  No LMP recorded. Patient is postmenopausal. Menarche: 54 years old GX P 0 LMP age [age] (early menopause) Contraceptive never used HRT used for <1 year  Hysterectomy? No, only myomectomy BSO? 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 She is single. She lives at home by herelf.    ADVANCED DIRECTIVES: not in place. She intends to name one of her sisters, Clent Demark or Insurance account manager as healthcare power of attorney.   HEALTH MAINTENANCE: Social History   Tobacco Use  . Smoking status: Never Smoker  . Smokeless tobacco: Never Used  Vaping Use  . Vaping Use: Never used  Substance Use Topics  . Alcohol use: Not Currently    Alcohol/week: 0.0  standard drinks    Comment: occasional  . Drug use: No     Colonoscopy: 11/2017, repeat due 2029  PAP: 06/2018, negative  Bone density: never done   No Known Allergies  Current Outpatient Medications  Medication Sig Dispense Refill  . ACCU-CHEK GUIDE test strip USE TWICE DAILY TO CHECK BLOOD SUGAR BEFORE BREAKFAST AND DINNER 100 strip 5  . acidophilus (RISAQUAD) CAPS capsule Take 1 capsule by mouth daily.    Marland Kitchen ALPRAZolam (XANAX) 0.5 MG tablet Take 1 tablet (0.5 mg total) by mouth 3 (three) times daily as needed for anxiety. 30 tablet 1  . amitriptyline (ELAVIL) 25 MG tablet Take 1 tablet (25 mg total) by mouth at bedtime. 30 tablet 3  . aspirin EC 81 MG tablet Take 81 mg by mouth daily. Swallow whole.    Marland Kitchen atorvastatin (LIPITOR) 10 MG tablet TAKE 1 TABLET(10 MG) BY MOUTH DAILY (Patient taking differently: Take 10 mg by mouth every 7 (seven) days. ) 90 tablet 1  . Biotin w/ Vitamins C & E (HAIR/SKIN/NAILS PO) Take 1 tablet by mouth daily.    . Coenzyme Q10 (CO Q-10) 100 MG CAPS Take by mouth.    . Continuous Blood  Gluc Receiver (FREESTYLE LIBRE 14 DAY READER) DEVI USE TO CHECK BLOOD SUGAR BEFORE MEALS AND AT BEDTIME 1 each 2  . Continuous Blood Gluc Sensor (FREESTYLE LIBRE 14 DAY SENSOR) MISC USE TO MEASURE BLOOD GLUCOSE FOUR TIMES DAILY BEFORE MEALS AND AT BEDTIME 2 each 11  . cyclobenzaprine (FLEXERIL) 10 MG tablet Take 10 mg by mouth at bedtime as needed for muscle spasms.     . DAYVIGO 10 MG TABS Take by mouth.     . esomeprazole (NEXIUM) 20 MG capsule Take 1 capsule (20 mg total) by mouth daily. 90 capsule 1  . FARXIGA 10 MG TABS tablet Take 10 mg by mouth every morning.     . Glucose Blood (GLUCOSE METER TEST VI) by In Vitro route 2 (two) times daily.    . meloxicam (MOBIC) 15 MG tablet Take 15 mg by mouth daily as needed for pain.     . metFORMIN (GLUCOPHAGE) 500 MG tablet Take 1 tablet (500 mg total) by mouth daily with breakfast. TAKE 1 TABLET BY MOUTH EVERY DAY WITH BREAKFAST 90  tablet 1  . Multiple Vitamin (MULTIVITAMIN WITH MINERALS) TABS tablet Take 1 tablet by mouth daily.    . Omega-3 1000 MG CAPS Take 1,000 mg by mouth daily.    . Semaglutide (RYBELSUS) 14 MG TABS Take 1 tablet by mouth daily. Take 30 minutes before breakfast 90 tablet 0  . Suvorexant (BELSOMRA) 10 MG TABS Take 1 tablet by mouth at bedtime as needed. 30 tablet 0  . tamoxifen (NOLVADEX) 20 MG tablet Take 1 tablet (20 mg total) by mouth daily. 90 tablet 12   No current facility-administered medications for this visit.    OBJECTIVE: African-American woman who appears younger than stated age  44:   12/08/19 1144  BP: 118/81  Pulse: (!) 102  Resp: 18  Temp: (!) 97.4 F (36.3 C)  SpO2: 98%     Body mass index is 28.96 kg/m.   Wt Readings from Last 3 Encounters:  12/08/19 179 lb 6.4 oz (81.4 kg)  12/01/19 182 lb (82.6 kg)  11/04/19 184 lb 6.4 oz (83.6 kg)      ECOG FS:2 - Symptomatic, <50% confined to bed  Sclerae unicteric, EOMs intact Wearing a mask No cervical or supraclavicular adenopathy Lungs no rales or rhonchi Heart regular rate and rhythm Abd soft, nontender, positive bowel sounds MSK no focal spinal tenderness, no upper extremity lymphedema Neuro: nonfocal, well oriented, appropriate affect Breasts: The right breast is status post biopsy and radiation. There is significant hyperpigmentation. There is dry desquamation in the inframammary fold and right axilla. Left breast is benign. Both axillae are benign otherwise  LAB RESULTS:  CMP     Component Value Date/Time   NA 138 09/16/2019 1122   NA 145 (H) 07/26/2019 0931   K 4.1 09/16/2019 1122   CL 106 09/16/2019 1122   CO2 23 09/16/2019 1122   GLUCOSE 135 (H) 09/16/2019 1122   BUN 14 09/16/2019 1122   BUN 12 07/26/2019 0931   CREATININE 0.85 09/16/2019 1122   CREATININE 0.88 09/01/2019 1210   CALCIUM 9.4 09/16/2019 1122   PROT 7.0 09/16/2019 1122   PROT 7.2 07/26/2019 0931   ALBUMIN 4.1 09/16/2019 1122    ALBUMIN 4.5 07/26/2019 0931   AST 30 09/16/2019 1122   AST 19 09/01/2019 1210   ALT 33 09/16/2019 1122   ALT 28 09/01/2019 1210   ALKPHOS 69 09/16/2019 1122   BILITOT 0.7 09/16/2019 1122   BILITOT  0.8 09/01/2019 1210   GFRNONAA >60 09/16/2019 1122   GFRNONAA >60 09/01/2019 1210   GFRAA >60 09/16/2019 1122   GFRAA >60 09/01/2019 1210    No results found for: TOTALPROTELP, ALBUMINELP, A1GS, A2GS, BETS, BETA2SER, GAMS, MSPIKE, SPEI  Lab Results  Component Value Date   WBC 6.2 09/16/2019   NEUTROABS 3.3 09/16/2019   HGB 13.3 09/16/2019   HCT 41.4 09/16/2019   MCV 84.8 09/16/2019   PLT 268 09/16/2019    No results found for: LABCA2  No components found for: EYCXKG818  No results for input(s): INR in the last 168 hours.  No results found for: LABCA2  No results found for: HUD149  No results found for: FWY637  No results found for: CHY850  No results found for: CA2729  No components found for: HGQUANT  No results found for: CEA1 / No results found for: CEA1   No results found for: AFPTUMOR  No results found for: CHROMOGRNA  No results found for: KPAFRELGTCHN, LAMBDASER, KAPLAMBRATIO (kappa/lambda light chains)  No results found for: HGBA, HGBA2QUANT, HGBFQUANT, HGBSQUAN (Hemoglobinopathy evaluation)   No results found for: LDH  No results found for: IRON, TIBC, IRONPCTSAT (Iron and TIBC)  No results found for: FERRITIN  Urinalysis    Component Value Date/Time   BILIRUBINUR negative 07/20/2018 1108   PROTEINUR Negative 07/20/2018 1108   UROBILINOGEN 0.2 07/20/2018 1108   NITRITE negative 07/20/2018 1108   LEUKOCYTESUR Negative 07/20/2018 1108     STUDIES: No results found.   ELIGIBLE FOR AVAILABLE RESEARCH PROTOCOL: AET  ASSESSMENT: 54 y.o. [place] woman status post right breast biopsy 08/25/2019 for ductal carcinoma in situ, grade 2 or 3, estrogen and progesterone receptor positive  (1) genetics testing 09/09/2019 found a heterozygous  pathogenic variant  in the BRCA2 gene called c.5946del (p.Ser1982Argfs*[id number])  (a) no additional deleterious mutations were noted through the [facility] Breast Cancer STAT panel in ATM, BRCA1, BRCA2, CDH1, CHEK2, PALB2, PTEN, STK11 and TP53.  (2) right lumpectomy and sentinel lymph node sampling 09/23/2019 showed ductal carcinoma in situ, largest section 0.5 cm, with negative margins: pTis pN0, stage 0  (a) a total of 2 axillary lymph nodes were removed  (3) adjuvant radiation completed 12/08/2019 (50.4 Gray to the right breast plus boost)  (4) tamoxifen started 12/08/2019  (5) bilateral salpingo-oophorectomy pending  (6) intensified screening vs bilateral mastectomies to follow-up   PLAN: Vanessa Allen has completed local treatment for her breast cancer. She is now ready to start systemic therapy. That will consist of antiestrogens.  We discussed the difference between tamoxifen and anastrozole in detail. She understands that anastrozole and the aromatase inhibitors in general work by blocking estrogen production. Accordingly vaginal dryness, decrease in bone density, and of course hot flashes can result. The aromatase inhibitors can also negatively affect the cholesterol profile, although that is a minor effect. One out of 5 women on aromatase inhibitors we will feel "old and achy". This arthralgia/myalgia syndrome, which resembles fibromyalgia clinically, does resolve with stopping the medications. Accordingly this is not a reason to not try an aromatase inhibitor but it is a frequent reason to stop it (in other words 20% of women will not be able to tolerate these medications).  Tamoxifen on the other hand does not block estrogen production. It does not "take away a woman's estrogen". It blocks the estrogen receptor in breast cells. Like anastrozole, it can also cause hot flashes. As opposed to anastrozole, tamoxifen has many estrogen-like effects. It is technically an estrogen receptor  modulator. This  means that in some tissues tamoxifen works like estrogen-- for example it helps strengthen the bones. It tends to improve the cholesterol profile. It can cause thickening of the endometrial lining, and even endometrial polyps or rarely cancer of the uterus.(The risk of uterine cancer due to tamoxifen is one additional cancer per thousand women year). It can cause vaginal wetness or stickiness. It can cause blood clots through this estrogen-like effect--the risk of blood clots with tamoxifen is exactly the same as with birth control pills or hormone replacement.  Neither of these agents causes mood changes or weight gain, despite the popular belief that they can have these side effects. We have data from studies comparing either of these drugs with placebo, and in those cases the control group had the same amount of weight gain and depression as the group that took the drug.  After much discussion we decided tamoxifen would be a better choice for her and I have gone ahead and written her the prescription for it.  She is planning on bilateral salpingo-oophorectomy sometime before the end of the year. She is not sure when she is going to have bilateral mastectomies. That will require planning. Between now and then she will be on tamoxifen and intensified screening but once she gets the bilateral mastectomies done she can stop both.  She had a breast MRI in August so we are going to do mammography in February and I will see her shortly after that  Total encounter time 40 minutes.Vanessa Allen C. Diamond Jentz, MD 12/08/2019 1:51 PM Medical Oncology and Hematology [facility] [place], [place] [address] Tel. [phone]    Fax. [phone]   This document serves as a record of services personally performed by Lurline Del, MD. It was created on his behalf by Wilburn Mylar, a trained medical scribe. The creation of this record is based on the scribe's personal observations  and the provider's statements to them.   I, Lurline Del MD, have reviewed the above documentation for accuracy and completeness, and I agree with the above.   *Total Encounter Time as defined by the Centers for Medicare and Medicaid Services includes, in addition to the face-to-face time of a patient visit (documented in the note above) non-face-to-face time: obtaining and reviewing outside history, ordering and reviewing medications, tests or procedures, care coordination (communications with other health care professionals or caregivers) and documentation in the medical record.

## 2019-12-08 ENCOUNTER — Inpatient Hospital Stay: Payer: BC Managed Care – PPO | Attending: Oncology | Admitting: Oncology

## 2019-12-08 ENCOUNTER — Encounter: Payer: Self-pay | Admitting: Radiation Oncology

## 2019-12-08 ENCOUNTER — Telehealth: Payer: Self-pay | Admitting: *Deleted

## 2019-12-08 ENCOUNTER — Ambulatory Visit
Admission: RE | Admit: 2019-12-08 | Discharge: 2019-12-08 | Disposition: A | Discharge status: Home / Self Care | Payer: BC Managed Care – PPO | Source: Ambulatory Visit | Attending: Radiation Oncology | Admitting: Radiation Oncology

## 2019-12-08 ENCOUNTER — Other Ambulatory Visit: Payer: Self-pay

## 2019-12-08 VITALS — BP 118/81 | HR 102 | Temp 97.4°F | Resp 18 | Ht 66.0 in | Wt 179.4 lb

## 2019-12-08 DIAGNOSIS — C50911 Malignant neoplasm of unspecified site of right female breast: Secondary | ICD-10-CM

## 2019-12-08 DIAGNOSIS — E119 Type 2 diabetes mellitus without complications: Secondary | ICD-10-CM | POA: Diagnosis not present

## 2019-12-08 DIAGNOSIS — Z170 Estrogen receptor positive status [ER+]: Secondary | ICD-10-CM | POA: Insufficient documentation

## 2019-12-08 DIAGNOSIS — Z510 Encounter for antineoplastic radiation therapy: Secondary | ICD-10-CM | POA: Diagnosis not present

## 2019-12-08 DIAGNOSIS — Z1502 Genetic susceptibility to malignant neoplasm of ovary: Secondary | ICD-10-CM | POA: Diagnosis not present

## 2019-12-08 DIAGNOSIS — D0511 Intraductal carcinoma in situ of right breast: Secondary | ICD-10-CM | POA: Diagnosis not present

## 2019-12-08 DIAGNOSIS — Z1379 Encounter for other screening for genetic and chromosomal anomalies: Secondary | ICD-10-CM | POA: Diagnosis not present

## 2019-12-08 DIAGNOSIS — Z79810 Long term (current) use of selective estrogen receptor modulators (SERMs): Secondary | ICD-10-CM | POA: Diagnosis not present

## 2019-12-08 DIAGNOSIS — Z148 Genetic carrier of other disease: Secondary | ICD-10-CM | POA: Insufficient documentation

## 2019-12-08 DIAGNOSIS — Z1501 Genetic susceptibility to malignant neoplasm of breast: Secondary | ICD-10-CM

## 2019-12-08 MED ORDER — TAMOXIFEN CITRATE 20 MG PO TABS: 20.0000 mg | ORAL_TABLET | Freq: Every day | ORAL | 12 refills | Status: AC

## 2019-12-08 NOTE — Telephone Encounter (Signed)
Called and rescheduled the patient's appt from 11/22 with Dr Denman George to 11/23 with Dr Berline Lopes due to surgery

## 2019-12-09 NOTE — Telephone Encounter (Signed)
FYI      Form received 12/07/2019.  Alleyah Twombly Therapist, sports.

## 2019-12-10 ENCOUNTER — Encounter (HOSPITAL_BASED_OUTPATIENT_CLINIC_OR_DEPARTMENT_OTHER)
Admission: RE | Admit: 2019-12-10 | Discharge: 2019-12-10 | Disposition: A | Discharge status: Home / Self Care | Payer: BC Managed Care – PPO | Source: Ambulatory Visit | Attending: Plastic Surgery | Admitting: Plastic Surgery

## 2019-12-10 ENCOUNTER — Other Ambulatory Visit (HOSPITAL_COMMUNITY)
Admission: RE | Admit: 2019-12-10 | Discharge: 2019-12-10 | Disposition: A | Discharge status: Home / Self Care | Payer: BC Managed Care – PPO | Source: Ambulatory Visit | Attending: Plastic Surgery | Admitting: Plastic Surgery

## 2019-12-10 ENCOUNTER — Ambulatory Visit: Payer: BC Managed Care – PPO | Admitting: Psychology

## 2019-12-10 DIAGNOSIS — Z806 Family history of leukemia: Secondary | ICD-10-CM | POA: Diagnosis not present

## 2019-12-10 DIAGNOSIS — Z01812 Encounter for preprocedural laboratory examination: Secondary | ICD-10-CM | POA: Insufficient documentation

## 2019-12-10 DIAGNOSIS — M67432 Ganglion, left wrist: Secondary | ICD-10-CM | POA: Diagnosis not present

## 2019-12-10 DIAGNOSIS — E119 Type 2 diabetes mellitus without complications: Secondary | ICD-10-CM | POA: Diagnosis not present

## 2019-12-10 DIAGNOSIS — Z79899 Other long term (current) drug therapy: Secondary | ICD-10-CM | POA: Diagnosis not present

## 2019-12-10 DIAGNOSIS — Z803 Family history of malignant neoplasm of breast: Secondary | ICD-10-CM | POA: Diagnosis not present

## 2019-12-10 DIAGNOSIS — Z7982 Long term (current) use of aspirin: Secondary | ICD-10-CM | POA: Diagnosis not present

## 2019-12-10 DIAGNOSIS — Z800 Family history of malignant neoplasm of digestive organs: Secondary | ICD-10-CM | POA: Diagnosis not present

## 2019-12-10 DIAGNOSIS — Z20822 Contact with and (suspected) exposure to covid-19: Secondary | ICD-10-CM | POA: Insufficient documentation

## 2019-12-10 DIAGNOSIS — K219 Gastro-esophageal reflux disease without esophagitis: Secondary | ICD-10-CM | POA: Diagnosis not present

## 2019-12-10 DIAGNOSIS — Z8041 Family history of malignant neoplasm of ovary: Secondary | ICD-10-CM | POA: Diagnosis not present

## 2019-12-10 LAB — BASIC METABOLIC PANEL
Anion gap: 8 (ref 5–15)
BUN: 14 mg/dL (ref 6–20)
CO2: 25 mmol/L (ref 22–32)
Calcium: 9.4 mg/dL (ref 8.9–10.3)
Chloride: 109 mmol/L (ref 98–111)
Creatinine, Ser: 0.95 mg/dL (ref 0.44–1.00)
GFR, Estimated: >60 mL/min (ref >60)
Glucose, Bld: 122 mg/dL — ABNORMAL HIGH (ref 70–99)
Potassium: 4 mmol/L (ref 3.5–5.1)
Sodium: 142 mmol/L (ref 135–145)

## 2019-12-10 LAB — SARS CORONAVIRUS 2 (TAT 6-24 HRS): SARS Coronavirus 2: NEGATIVE

## 2019-12-13 ENCOUNTER — Ambulatory Visit (HOSPITAL_BASED_OUTPATIENT_CLINIC_OR_DEPARTMENT_OTHER): Payer: BC Managed Care – PPO | Admitting: Anesthesiology

## 2019-12-13 ENCOUNTER — Encounter (HOSPITAL_BASED_OUTPATIENT_CLINIC_OR_DEPARTMENT_OTHER)
Admission: RE | Disposition: A | Discharge status: Home / Self Care | Payer: Self-pay | Source: Home / Self Care | Attending: Plastic Surgery

## 2019-12-13 ENCOUNTER — Ambulatory Visit (HOSPITAL_BASED_OUTPATIENT_CLINIC_OR_DEPARTMENT_OTHER)
Admission: RE | Admit: 2019-12-13 | Discharge: 2019-12-13 | Disposition: A | Discharge status: Home / Self Care | Payer: BC Managed Care – PPO | Attending: Plastic Surgery | Admitting: Plastic Surgery

## 2019-12-13 ENCOUNTER — Encounter (HOSPITAL_BASED_OUTPATIENT_CLINIC_OR_DEPARTMENT_OTHER): Payer: Self-pay | Admitting: Plastic Surgery

## 2019-12-13 DIAGNOSIS — Z20822 Contact with and (suspected) exposure to covid-19: Secondary | ICD-10-CM | POA: Diagnosis not present

## 2019-12-13 DIAGNOSIS — Z800 Family history of malignant neoplasm of digestive organs: Secondary | ICD-10-CM | POA: Insufficient documentation

## 2019-12-13 DIAGNOSIS — Z79899 Other long term (current) drug therapy: Secondary | ICD-10-CM | POA: Diagnosis not present

## 2019-12-13 DIAGNOSIS — E119 Type 2 diabetes mellitus without complications: Secondary | ICD-10-CM | POA: Diagnosis not present

## 2019-12-13 DIAGNOSIS — Z8041 Family history of malignant neoplasm of ovary: Secondary | ICD-10-CM | POA: Diagnosis not present

## 2019-12-13 DIAGNOSIS — K219 Gastro-esophageal reflux disease without esophagitis: Secondary | ICD-10-CM | POA: Diagnosis not present

## 2019-12-13 DIAGNOSIS — Z806 Family history of leukemia: Secondary | ICD-10-CM | POA: Insufficient documentation

## 2019-12-13 DIAGNOSIS — Z803 Family history of malignant neoplasm of breast: Secondary | ICD-10-CM | POA: Diagnosis not present

## 2019-12-13 DIAGNOSIS — Z7982 Long term (current) use of aspirin: Secondary | ICD-10-CM | POA: Diagnosis not present

## 2019-12-13 DIAGNOSIS — M67432 Ganglion, left wrist: Secondary | ICD-10-CM | POA: Diagnosis not present

## 2019-12-13 DIAGNOSIS — Z7984 Long term (current) use of oral hypoglycemic drugs: Secondary | ICD-10-CM | POA: Diagnosis not present

## 2019-12-13 HISTORY — DX: Other specified postprocedural states: Z98.890

## 2019-12-13 HISTORY — DX: Other specified postprocedural states: R11.2

## 2019-12-13 HISTORY — PX: CYST EXCISION: SHX5701

## 2019-12-13 HISTORY — DX: Other complications of anesthesia, initial encounter: T88.59XA

## 2019-12-13 LAB — GLUCOSE, CAPILLARY
Glucose-Capillary: 110 mg/dL — ABNORMAL HIGH (ref 70–99)
Glucose-Capillary: 108 mg/dL — ABNORMAL HIGH (ref 70–99)

## 2019-12-13 SURGERY — CYST REMOVAL
Anesthesia: General | Site: Wrist | Laterality: Left

## 2019-12-13 MED ORDER — PROPOFOL 10 MG/ML IV BOLUS
INTRAVENOUS | Status: AC
  Filled 2019-12-13: qty 20

## 2019-12-13 MED ORDER — SCOPOLAMINE 1 MG/3DAYS TD PT72
MEDICATED_PATCH | TRANSDERMAL | Status: AC
  Filled 2019-12-13: qty 1

## 2019-12-13 MED ORDER — LACTATED RINGERS IV SOLN: INTRAVENOUS | Status: DC

## 2019-12-13 MED ORDER — AMISULPRIDE (ANTIEMETIC) 5 MG/2ML IV SOLN: 10.0000 mg | Freq: Once | INTRAVENOUS | Status: DC | PRN

## 2019-12-13 MED ORDER — PROPOFOL 10 MG/ML IV BOLUS
INTRAVENOUS | Status: DC | PRN
  Administered 2019-12-13: 200 mg via INTRAVENOUS

## 2019-12-13 MED ORDER — ACETAMINOPHEN 500 MG PO TABS
1000.0000 mg | ORAL_TABLET | Freq: Once | ORAL | Status: AC
  Administered 2019-12-13: 1000 mg via ORAL

## 2019-12-13 MED ORDER — LIDOCAINE 2% (20 MG/ML) 5 ML SYRINGE
INTRAMUSCULAR | Status: DC | PRN
  Administered 2019-12-13: 60 mg via INTRAVENOUS

## 2019-12-13 MED ORDER — CELECOXIB 200 MG PO CAPS
200.0000 mg | ORAL_CAPSULE | Freq: Once | ORAL | Status: AC
  Administered 2019-12-13: 200 mg via ORAL

## 2019-12-13 MED ORDER — DEXAMETHASONE SODIUM PHOSPHATE 10 MG/ML IJ SOLN
INTRAMUSCULAR | Status: AC
  Filled 2019-12-13: qty 1

## 2019-12-13 MED ORDER — SCOPOLAMINE 1 MG/3DAYS TD PT72
1.0000 | MEDICATED_PATCH | TRANSDERMAL | Status: DC
  Administered 2019-12-13: 1.5 mg via TRANSDERMAL

## 2019-12-13 MED ORDER — CELECOXIB 200 MG PO CAPS
ORAL_CAPSULE | ORAL | Status: AC
  Filled 2019-12-13: qty 1

## 2019-12-13 MED ORDER — LIDOCAINE-EPINEPHRINE 1 %-1:100000 IJ SOLN
INTRAMUSCULAR | Status: DC | PRN
  Administered 2019-12-13: 10 mL

## 2019-12-13 MED ORDER — MIDAZOLAM HCL 5 MG/5ML IJ SOLN
INTRAMUSCULAR | Status: DC | PRN
  Administered 2019-12-13: 2 mg via INTRAVENOUS

## 2019-12-13 MED ORDER — DEXAMETHASONE SODIUM PHOSPHATE 10 MG/ML IJ SOLN
INTRAMUSCULAR | Status: DC | PRN
  Administered 2019-12-13: 10 mg via INTRAVENOUS

## 2019-12-13 MED ORDER — ONDANSETRON HCL 4 MG/2ML IJ SOLN
INTRAMUSCULAR | Status: DC | PRN
  Administered 2019-12-13: 4 mg via INTRAVENOUS

## 2019-12-13 MED ORDER — CEFAZOLIN SODIUM-DEXTROSE 2-4 GM/100ML-% IV SOLN
2.0000 g | INTRAVENOUS | Status: AC
  Administered 2019-12-13: 2 g via INTRAVENOUS

## 2019-12-13 MED ORDER — FENTANYL CITRATE (PF) 100 MCG/2ML IJ SOLN: 25.0000 ug | INTRAMUSCULAR | Status: DC | PRN

## 2019-12-13 MED ORDER — MIDAZOLAM HCL 2 MG/2ML IJ SOLN
INTRAMUSCULAR | Status: AC
  Filled 2019-12-13: qty 2

## 2019-12-13 MED ORDER — PHENYLEPHRINE 40 MCG/ML (10ML) SYRINGE FOR IV PUSH (FOR BLOOD PRESSURE SUPPORT)
PREFILLED_SYRINGE | INTRAVENOUS | Status: AC
  Filled 2019-12-13: qty 10

## 2019-12-13 MED ORDER — CEFAZOLIN SODIUM-DEXTROSE 2-4 GM/100ML-% IV SOLN
INTRAVENOUS | Status: AC
  Filled 2019-12-13: qty 100

## 2019-12-13 MED ORDER — ACETAMINOPHEN 500 MG PO TABS
ORAL_TABLET | ORAL | Status: AC
  Filled 2019-12-13: qty 2

## 2019-12-13 MED ORDER — ONDANSETRON HCL 4 MG/2ML IJ SOLN
INTRAMUSCULAR | Status: AC
  Filled 2019-12-13: qty 2

## 2019-12-13 MED ORDER — FENTANYL CITRATE (PF) 100 MCG/2ML IJ SOLN
INTRAMUSCULAR | Status: AC
  Filled 2019-12-13: qty 2

## 2019-12-13 MED ORDER — FENTANYL CITRATE (PF) 250 MCG/5ML IJ SOLN
INTRAMUSCULAR | Status: DC | PRN
  Administered 2019-12-13 (×2): 25 ug via INTRAVENOUS

## 2019-12-13 MED ORDER — PHENYLEPHRINE 40 MCG/ML (10ML) SYRINGE FOR IV PUSH (FOR BLOOD PRESSURE SUPPORT)
PREFILLED_SYRINGE | INTRAVENOUS | Status: DC | PRN
  Administered 2019-12-13 (×2): 80 ug via INTRAVENOUS

## 2019-12-13 SURGICAL SUPPLY — 45 items
APL PRP STRL LF DISP 70% ISPRP (MISCELLANEOUS) ×1
APL SKNCLS STERI-STRIP NONHPOA (GAUZE/BANDAGES/DRESSINGS) ×1
BENZOIN TINCTURE PRP APPL 2/3 (GAUZE/BANDAGES/DRESSINGS) ×1 IMPLANT
BLADE MINI RND TIP GREEN BEAV (BLADE) IMPLANT
BLADE SURG 15 STRL LF DISP TIS (BLADE) ×1 IMPLANT
BLADE SURG 15 STRL SS (BLADE) ×2
BNDG CMPR 9X4 STRL LF SNTH (GAUZE/BANDAGES/DRESSINGS) ×1
BNDG COHESIVE 1X5 TAN STRL LF (GAUZE/BANDAGES/DRESSINGS) IMPLANT
BNDG COHESIVE 2X5 TAN STRL LF (GAUZE/BANDAGES/DRESSINGS) IMPLANT
BNDG COHESIVE 3X5 TAN STRL LF (GAUZE/BANDAGES/DRESSINGS) IMPLANT
BNDG ELASTIC 3X5.8 VLCR STR LF (GAUZE/BANDAGES/DRESSINGS) ×2 IMPLANT
BNDG ESMARK 4X9 LF (GAUZE/BANDAGES/DRESSINGS) ×1 IMPLANT
BNDG GAUZE ELAST 4 BULKY (GAUZE/BANDAGES/DRESSINGS) ×2 IMPLANT
CHLORAPREP W/TINT 26 (MISCELLANEOUS) ×2 IMPLANT
CORD BIPOLAR FORCEPS 12FT (ELECTRODE) ×2 IMPLANT
COVER BACK TABLE 60X90IN (DRAPES) ×2 IMPLANT
COVER MAYO STAND STRL (DRAPES) ×2 IMPLANT
COVER WAND RF STERILE (DRAPES) IMPLANT
CUFF TOURN SGL QUICK 18X4 (TOURNIQUET CUFF) ×1 IMPLANT
DRAPE EXTREMITY T 121X128X90 (DISPOSABLE) ×2 IMPLANT
DRAPE SURG 17X23 STRL (DRAPES) ×2 IMPLANT
GAUZE SPONGE 4X4 12PLY STRL (GAUZE/BANDAGES/DRESSINGS) ×2 IMPLANT
GAUZE XEROFORM 1X8 LF (GAUZE/BANDAGES/DRESSINGS) ×1 IMPLANT
GLOVE BIO SURGEON STRL SZ 6.5 (GLOVE) ×1 IMPLANT
GLOVE BIO SURGEON STRL SZ7 (GLOVE) ×2 IMPLANT
GLOVE BIOGEL M STRL SZ7.5 (GLOVE) ×2 IMPLANT
GLOVE BIOGEL PI IND STRL 8 (GLOVE) ×1 IMPLANT
GLOVE BIOGEL PI INDICATOR 8 (GLOVE) ×1
GLOVE ECLIPSE 6.5 STRL STRAW (GLOVE) ×1 IMPLANT
GOWN STRL REUS W/ TWL LRG LVL3 (GOWN DISPOSABLE) ×1 IMPLANT
GOWN STRL REUS W/TWL LRG LVL3 (GOWN DISPOSABLE) ×6 IMPLANT
NDL PRECISIONGLIDE 27X1.5 (NEEDLE) IMPLANT
NEEDLE PRECISIONGLIDE 27X1.5 (NEEDLE) ×2 IMPLANT
NS IRRIG 1000ML POUR BTL (IV SOLUTION) ×2 IMPLANT
PACK BASIN DAY SURGERY FS (CUSTOM PROCEDURE TRAY) ×2 IMPLANT
PAD CAST 3X4 CTTN HI CHSV (CAST SUPPLIES) IMPLANT
PADDING CAST COTTON 3X4 STRL (CAST SUPPLIES)
STOCKINETTE 4X48 STRL (DRAPES) ×1 IMPLANT
STRIP CLOSURE SKIN 1/2X4 (GAUZE/BANDAGES/DRESSINGS) ×1 IMPLANT
SUT ETHILON 4 0 PS 2 18 (SUTURE) ×1 IMPLANT
SUT MNCRL AB 4-0 PS2 18 (SUTURE) ×1 IMPLANT
SYR BULB EAR ULCER 3OZ GRN STR (SYRINGE) ×2 IMPLANT
SYR CONTROL 10ML LL (SYRINGE) ×1 IMPLANT
TOWEL GREEN STERILE FF (TOWEL DISPOSABLE) ×2 IMPLANT
UNDERPAD 30X36 HEAVY ABSORB (UNDERPADS AND DIAPERS) ×1 IMPLANT

## 2019-12-13 NOTE — Anesthesia Preprocedure Evaluation (Signed)
Anesthesia Evaluation  Patient identified by MRN, date of birth, ID band Patient awake    Reviewed: Allergy & Precautions, NPO status , Patient's Chart, lab work & pertinent test results  Airway Mallampati: II  TM Distance: >3 FB Neck ROM: Full    Dental  (+) Dental Advisory Given   Pulmonary neg pulmonary ROS,    breath sounds clear to auscultation       Cardiovascular negative cardio ROS   Rhythm:Regular Rate:Normal     Neuro/Psych negative neurological ROS     GI/Hepatic Neg liver ROS, GERD  ,  Endo/Other  diabetes, Type 2, Oral Hypoglycemic Agents  Renal/GU negative Renal ROS     Musculoskeletal   Abdominal   Peds  Hematology negative hematology ROS (+)   Anesthesia Other Findings   Reproductive/Obstetrics                             Anesthesia Physical Anesthesia Plan  ASA: III  Anesthesia Plan: General   Post-op Pain Management:    Induction: Intravenous  PONV Risk Score and Plan: 3 and Midazolam, Dexamethasone, Ondansetron and Treatment may vary due to age or medical condition  Airway Management Planned: LMA  Additional Equipment: None  Intra-op Plan:   Post-operative Plan: Extubation in OR  Informed Consent: I have reviewed the patients History and Physical, chart, labs and discussed the procedure including the risks, benefits and alternatives for the proposed anesthesia with the patient or authorized representative who has indicated his/her understanding and acceptance.     Dental advisory given  Plan Discussed with: CRNA  Anesthesia Plan Comments:         Anesthesia Quick Evaluation

## 2019-12-13 NOTE — Anesthesia Postprocedure Evaluation (Signed)
Anesthesia Post Note  Patient: Vanessa Allen  Procedure(s) Performed: Excision left dorsal wrist ganglion cyst (Left Wrist)     Patient location during evaluation: PACU Anesthesia Type: General Level of consciousness: awake and alert Pain management: pain level controlled Vital Signs Assessment: post-procedure vital signs reviewed and stable Respiratory status: spontaneous breathing, nonlabored ventilation, respiratory function stable and patient connected to nasal cannula oxygen Cardiovascular status: blood pressure returned to baseline and stable Postop Assessment: no apparent nausea or vomiting Anesthetic complications: no   No complications documented.  Last Vitals:  Vitals:   12/13/19 1230 12/13/19 1305  BP: 135/81 (!) 149/88  Pulse: 91 82  Resp: 16 16  Temp:  37 C  SpO2: 99% 98%    Last Pain:  Vitals:   12/13/19 1305  TempSrc:   PainSc: 0-No pain                 Tiajuana Amass

## 2019-12-13 NOTE — Anesthesia Procedure Notes (Signed)
Procedure Name: LMA Insertion Date/Time: 12/13/2019 11:30 AM Performed by: Myna Bright, CRNA Pre-anesthesia Checklist: Patient identified, Emergency Drugs available, Suction available and Patient being monitored Patient Re-evaluated:Patient Re-evaluated prior to induction Oxygen Delivery Method: Circle system utilized Preoxygenation: Pre-oxygenation with 100% oxygen Induction Type: IV induction Ventilation: Mask ventilation without difficulty LMA: LMA inserted LMA Size: 4.0 Number of attempts: 1 Placement Confirmation: positive ETCO2 and breath sounds checked- equal and bilateral Tube secured with: Tape Dental Injury: Teeth and Oropharynx as per pre-operative assessment

## 2019-12-13 NOTE — Brief Op Note (Signed)
12/13/2019  11:59 AM  PATIENT:  Vanessa Allen  54 y.o. female  PRE-OPERATIVE DIAGNOSIS:  left wrist ganglion cyst  POST-OPERATIVE DIAGNOSIS:  left wrist ganglion cyst  PROCEDURE:  Procedure(s) with comments: Excision left dorsal wrist ganglion cyst (Left) - 45 min  SURGEON:  Surgeon(s) and Role:    * Virdie Penning, Steffanie Dunn, MD - Primary  PHYSICIAN ASSISTANT: Phoebe Sharps, PA  ASSISTANTS: none   ANESTHESIA:   general  EBL:  10   BLOOD ADMINISTERED:none  DRAINS: none   LOCAL MEDICATIONS USED:  XYLOCAINE   SPECIMEN:  Source of Specimen:  left wrist ganglion  DISPOSITION OF SPECIMEN:  PATHOLOGY  COUNTS:  YES  TOURNIQUET:   Total Tourniquet Time Documented: Upper Arm (Left) - 19 minutes Total: Upper Arm (Left) - 19 minutes   DICTATION: .Viviann Spare Dictation  PLAN OF CARE: Discharge to home after PACU  PATIENT DISPOSITION:  PACU - hemodynamically stable.   Delay start of Pharmacological VTE agent (>24hrs) due to surgical blood loss or risk of bleeding: not applicable

## 2019-12-13 NOTE — Discharge Instructions (Signed)
Activity: As tolerated, but avoid strenuous activity until follow up visit.  Diet: Regular  Wound Care: Keep dressing clean & dry for 2 days.  After that you can shower normally.  Redress the wound as needed for comfort.  Special Instructions:  Call our office if any unusual problems occur such as pain, excessive bleeding, unrelieved nausea/vomiting, fever &/or chills.  Follow-up appointment: Scheduled for next week on 11/24  No Ibuprofen or Tylenol until 5:30pm if needed   Post Anesthesia Home Care Instructions  Activity: Get plenty of rest for the remainder of the day. A responsible individual must stay with you for 24 hours following the procedure.  For the next 24 hours, DO NOT: -Drive a car -Paediatric nurse -Drink alcoholic beverages -Take any medication unless instructed by your physician -Make any legal decisions or sign important papers.  Meals: Start with liquid foods such as gelatin or soup. Progress to regular foods as tolerated. Avoid greasy, spicy, heavy foods. If nausea and/or vomiting occur, drink only clear liquids until the nausea and/or vomiting subsides. Call your physician if vomiting continues.  Special Instructions/Symptoms: Your throat may feel dry or sore from the anesthesia or the breathing tube placed in your throat during surgery. If this causes discomfort, gargle with warm salt water. The discomfort should disappear within 24 hours.  If you had a scopolamine patch placed behind your ear for the management of post- operative nausea and/or vomiting:  1. The medication in the patch is effective for 72 hours, after which it should be removed.  Wrap patch in a tissue and discard in the trash. Wash hands thoroughly with soap and water. 2. You may remove the patch earlier than 72 hours if you experience unpleasant side effects which may include dry mouth, dizziness or visual disturbances. 3. Avoid touching the patch. Wash your hands with soap and water  after contact with the patch.

## 2019-12-13 NOTE — Op Note (Signed)
Operative Note   DATE OF OPERATION: 12/13/2019  SURGICAL DEPARTMENT: Plastic Surgery  PREOPERATIVE DIAGNOSES:  Dorsal left wrist ganglion  POSTOPERATIVE DIAGNOSES:  same  PROCEDURE:  Excision dorsal left wrist ganglion cyst  SURGEON: Talmadge Coventry, MD  ASSISTANT: Phoebe Sharps, PA The advanced practice practitioner (APP) assisted throughout the case.  The APP was essential in retraction and counter traction when needed to make the case progress smoothly.  This retraction and assistance made it possible to see the tissue plans for the procedure.  The assistance was needed for blood control, tissue re-approximation and assisted with closure of the incision site.  ANESTHESIA:  General.   COMPLICATIONS: None.   INDICATIONS FOR PROCEDURE:  The patient, Vanessa Allen is a 54 y.o. female born on 25-Nov-1965, is here for treatment of dorsal left wrist ganglion MRN: [id number]  CONSENT:  Informed consent was obtained directly from the patient. Risks, benefits and alternatives were fully discussed. Specific risks including but not limited to bleeding, infection, hematoma, seroma, scarring, pain, contracture, asymmetry, wound healing problems, and need for further surgery were all discussed. The patient did have an ample opportunity to have questions answered to satisfaction.   DESCRIPTION OF PROCEDURE:  The patient was taken to the operating room. SCDs were placed and antibiotics were given.  General anesthesia was administered.  The patient's operative site was prepped and draped in a sterile fashion. A time out was performed and all information was confirmed to be correct.  Local anesthesia was infiltrated.  The arm was then exsanguinated with gravity and tourniquet inflated to 250 mmHg.  Longitudinal incision was made dorsally with a 15 blade.  Tenotomy dissection was used to dissect out the cyst which appeared to be a ganglion coming from the approximate SL interval.  The extensor  tendons were retracted and the cyst was excised taking a small piece of the wrist capsule.  I then confirmed that the index and thumb extensors along with the wrist extensors were all totally intact which they were.  Closure was done with interrupted buried 4-0 Monocryl sutures and Steri-Strips.  A soft dressing was then applied.  Tourniquet was let down at 19 minutes.  The patient tolerated the procedure well.  There were no complications. The patient was allowed to wake from anesthesia, extubated and taken to the recovery room in satisfactory condition.

## 2019-12-13 NOTE — Interval H&P Note (Signed)
History and Physical Interval Note:  12/13/2019 10:52 AM  Vanessa Allen  has presented today for surgery, with the diagnosis of left wrist ganglion cyst.  The various methods of treatment have been discussed with the patient and family. After consideration of risks, benefits and other options for treatment, the patient has consented to  Procedure(s) with comments: Excision left dorsal wrist ganglion cyst (Left) - 45 min as a surgical intervention.  The patient's history has been reviewed, patient examined, no change in status, stable for surgery.  I have reviewed the patient's chart and labs.  Questions were answered to the patient's satisfaction.     Cindra Presume

## 2019-12-13 NOTE — Transfer of Care (Signed)
Immediate Anesthesia Transfer of Care Note  Patient: Vanessa Allen  Procedure(s) Performed: Excision left dorsal wrist ganglion cyst (Left Wrist)  Patient Location: PACU  Anesthesia Type:General  Level of Consciousness: awake, alert , oriented and patient cooperative  Airway & Oxygen Therapy: Patient Spontanous Breathing and Patient connected to face mask oxygen  Post-op Assessment: Report given to RN, Post -op Vital signs reviewed and stable and Patient moving all extremities  Post vital signs: Reviewed and stable  Last Vitals:  Vitals Value Taken Time  BP 113/70 12/13/19 1203  Temp    Pulse 97 12/13/19 1207  Resp 10 12/13/19 1207  SpO2 100 % 12/13/19 1207  Vitals shown include unvalidated device data.  Last Pain:  Vitals:   12/13/19 1123  TempSrc: Oral         Complications: No complications documented.

## 2019-12-14 ENCOUNTER — Telehealth: Payer: Self-pay | Admitting: Oncology

## 2019-12-14 LAB — SURGICAL PATHOLOGY

## 2019-12-14 NOTE — Telephone Encounter (Signed)
Scheduled per 11/10 los. Called pt no answer left a msg

## 2019-12-15 ENCOUNTER — Encounter (HOSPITAL_BASED_OUTPATIENT_CLINIC_OR_DEPARTMENT_OTHER): Payer: Self-pay | Admitting: Plastic Surgery

## 2019-12-16 DIAGNOSIS — G4733 Obstructive sleep apnea (adult) (pediatric): Secondary | ICD-10-CM | POA: Diagnosis not present

## 2019-12-20 ENCOUNTER — Other Ambulatory Visit: Payer: Self-pay

## 2019-12-20 ENCOUNTER — Ambulatory Visit: Payer: BC Managed Care – PPO | Attending: Surgery

## 2019-12-20 ENCOUNTER — Ambulatory Visit: Payer: BC Managed Care – PPO | Admitting: Gynecologic Oncology

## 2019-12-20 DIAGNOSIS — Z483 Aftercare following surgery for neoplasm: Secondary | ICD-10-CM

## 2019-12-20 NOTE — Therapy (Signed)
[facility] [address] [place], [place], [address] Phone: [phone]   Fax:  [phone]  Physical Therapy Treatment  Patient Details  Name: Vanessa Allen MRN: [id number] Date of Birth: 06/24/1965 Referring Provider (PT): Dr. Thomas Cornett   Encounter Date: 12/20/2019   PT End of Session - 12/20/19 1601    Visit Number 2   # unchanged due to screen only   Number of Visits 2    Date for PT Re-Evaluation 11/01/19    PT Start Time 1550    PT Stop Time 1602    PT Time Calculation (min) 12 min    Activity Tolerance Patient tolerated treatment well    Behavior During Therapy WFL for tasks assessed/performed           Past Medical History:  Diagnosis Date  . Cancer (HCC)    Right Breast Cancer  . Complication of anesthesia   . Diabetes mellitus without complication (HCC)   . Family history of BRCA gene mutation   . Family history of breast cancer   . Family history of leukemia   . Family history of ovarian cancer   . Family history of stomach cancer   . GERD (gastroesophageal reflux disease)   . PONV (postoperative nausea and vomiting)     Past Surgical History:  Procedure Laterality Date  . APPENDECTOMY    . BREAST LUMPECTOMY WITH RADIOACTIVE SEED AND SENTINEL LYMPH NODE BIOPSY Right 09/23/2019   Procedure: RIGHT BREAST LUMPECTOMY WITH RADIOACTIVE SEED AND SENTINEL LYMPH NODE MAPPING;  Surgeon: Cornett, Thomas, MD;  Location: [facility] OR;  Service: General;  Laterality: Right;  PEC BLOCK  . CHOLECYSTECTOMY    . CYST EXCISION Left 12/13/2019   Procedure: Excision left dorsal wrist ganglion cyst;  Surgeon: Pace, Collier S, MD;  Location: [facility];  Service: Plastics;  Laterality: Left;  45 min  . Lipoma removal Left    Left Lower Back  . MYOMECTOMY    . WISDOM TOOTH EXTRACTION      There were no vitals filed for this visit.   Subjective Assessment - 12/20/19 1555    Subjective Pt returns for 3  month L-Dex screen.    Pertinent History Patient was diagnosed on 07/23/2019 with right intermediate grade DCIS with concerns for possible invasive disease. It measures 1.9 cm and is located in the upper inner quadrant. It is ER/PR positive. On 09/23/2019, she had a right lumpectomy with 3 nodes removed.  Radiatation started 9/27 and is schedled til 11/9 she will have no chemo                  L-DEX FLOWSHEETS - 12/20/19 1500      L-DEX LYMPHEDEMA SCREENING   BASELINE SCORE (UNILATERAL) 3.3    L-DEX SCORE (UNILATERAL) 2.7    VALUE CHANGE (UNILAT) -0.6                                  PT Long Term Goals - 11/01/19 1236      PT LONG TERM GOAL #1   Title Patient will demonstrate she has regained full shoulder ROM and function post operatively compared to baselines.    Baseline pt reports she has mild pain in right shoudler    Time 8    Period Weeks    Status Partially Met                   Plan - 12/20/19 1602    Clinical Impression Statement Pt returns for 3 month L-Dex screen. Her change from baseline of -0.6 is WNLs so no further treatment required at this time except to cont every 3 month screens for up to 2 years from [facility], then every 6 months after. Pt agreeable to this.    PT Next Visit Plan Cont L-Dex screens as indicated above.    Consulted and Agree with Plan of Care Patient           Patient will benefit from skilled therapeutic intervention in order to improve the following deficits and impairments:     Visit Diagnosis: Aftercare following surgery for neoplasm     Problem List Patient Active Problem List   Diagnosis Date Noted  . Breast cancer, BRCA2 positive, right ([place]) 12/08/2019  . Central sleep apnea 09/20/2019  . Genetic testing 09/10/2019  . BRCA2 gene mutation positive in female 09/10/2019  . Family history of BRCA gene mutation   . Family history of breast cancer   . Family history of ovarian cancer   . Family  history of stomach cancer   . Family history of leukemia   . Ductal carcinoma in situ (DCIS) of right breast 08/31/2019  . Chronic insomnia 06/08/2019  . Atypical chest pain 07/20/2018  . Chest discomfort 05/21/2018  . Anxiety 05/21/2018  . Snoring 10/27/2017  . Episodic circadian rhythm sleep disorder, shift work type 10/27/2017  . Insomnia 10/27/2017  . Type 2 diabetes mellitus without complication, without long-term current use of insulin ([place]) 10/27/2017    Otelia Limes, PTA 12/20/2019, 4:06 PM  [place], [place], [address] Phone: [phone]   Fax:  [phone]  Name: Vanessa Allen MRN: [id number] Date of Birth: December 01, 1965

## 2019-12-21 ENCOUNTER — Other Ambulatory Visit: Payer: Self-pay | Admitting: *Deleted

## 2019-12-21 ENCOUNTER — Other Ambulatory Visit: Payer: Self-pay

## 2019-12-21 ENCOUNTER — Encounter: Payer: Self-pay | Admitting: Gynecologic Oncology

## 2019-12-21 ENCOUNTER — Inpatient Hospital Stay (HOSPITAL_BASED_OUTPATIENT_CLINIC_OR_DEPARTMENT_OTHER): Payer: BC Managed Care – PPO | Admitting: Gynecologic Oncology

## 2019-12-21 VITALS — BP 124/84 | HR 93 | Temp 97.8°F | Resp 18 | Ht 66.0 in | Wt 184.6 lb

## 2019-12-21 DIAGNOSIS — Z79810 Long term (current) use of selective estrogen receptor modulators (SERMs): Secondary | ICD-10-CM | POA: Diagnosis not present

## 2019-12-21 DIAGNOSIS — Z1502 Genetic susceptibility to malignant neoplasm of ovary: Secondary | ICD-10-CM

## 2019-12-21 DIAGNOSIS — D0511 Intraductal carcinoma in situ of right breast: Secondary | ICD-10-CM | POA: Diagnosis not present

## 2019-12-21 DIAGNOSIS — Z148 Genetic carrier of other disease: Secondary | ICD-10-CM | POA: Diagnosis not present

## 2019-12-21 DIAGNOSIS — C50911 Malignant neoplasm of unspecified site of right female breast: Secondary | ICD-10-CM | POA: Diagnosis not present

## 2019-12-21 DIAGNOSIS — Z170 Estrogen receptor positive status [ER+]: Secondary | ICD-10-CM | POA: Diagnosis not present

## 2019-12-21 DIAGNOSIS — Z1501 Genetic susceptibility to malignant neoplasm of breast: Secondary | ICD-10-CM

## 2019-12-21 MED ORDER — SENNOSIDES-DOCUSATE SODIUM 8.6-50 MG PO TABS: 2.0000 | ORAL_TABLET | Freq: Every day | ORAL | 0 refills | Status: DC

## 2019-12-21 MED ORDER — IBUPROFEN 800 MG PO TABS: 800.0000 mg | ORAL_TABLET | Freq: Three times a day (TID) | ORAL | 0 refills | Status: DC | PRN

## 2019-12-21 MED ORDER — HYDROCODONE-ACETAMINOPHEN 5-325 MG PO TABS: 1.0000 | ORAL_TABLET | ORAL | 0 refills | Status: DC | PRN

## 2019-12-21 MED ORDER — LORAZEPAM 1 MG PO TABS
ORAL_TABLET | ORAL | 0 refills | Status: DC
Start: 2019-12-21 — End: 2020-02-28

## 2019-12-21 NOTE — Patient Instructions (Addendum)
Preparing for your Surgery  Plan for surgery on February 22, 2019 with Dr. Jeral Pinch at [facility] will be scheduled for a robotic assisted laparoscopic bilateral salpingo-oophorectomy (removal of both ovaries and fallopian tubes), possible laparotomy (larger incision on your abdomen if needed).  Pre-operative Testing -You will receive a phone call from presurgical testing at [facility] to arrange for a pre-operative appointment, lab appointment, and COVID test. The COVID test normally happens 3 days prior to the surgery and they ask that you self quarantine after the test up until surgery to decrease chance of exposure.  -Bring your insurance card, copy of an advanced directive if applicable, medication list  -At that visit, you will be asked to sign a consent for a possible blood transfusion in case a transfusion becomes necessary during surgery.  The need for a blood transfusion is rare but having consent is a necessary part of your care.     -You should not be taking blood thinners or aspirin at least ten days prior to surgery unless instructed by your surgeon.  -Do not take supplements such as fish oil (omega 3), red yeast rice, turmeric before your surgery. YOU WILL NEED TO STOP YOUR FISH OIL AND TURMERIC AT LEAST ONE MONTH BEFORE SURGERY.  Day Before Surgery at [facility] will be asked to take in a light diet the day before surgery. You will be advised you can have clear liquids up until 3 hours before your surgery.    Eat a light diet the day before surgery.  Examples including soups, broths, toast, yogurt, mashed potatoes.  AVOID GAS PRODUCING FOODS. Things to avoid include carbonated beverages (fizzy beverages), raw fruits and raw vegetables, or beans.   If your bowels are filled with gas, your surgeon will have difficulty visualizing your pelvic organs which increases your surgical risks.  Your role in recovery Your role is to become active as soon as  directed by your doctor, while still giving yourself time to heal.  Rest when you feel tired. You will be asked to do the following in order to speed your recovery:  - Cough and breathe deeply. This helps to clear and expand your lungs and can prevent pneumonia after surgery.  - [place]. Do mild physical activity. Walking or moving your legs help your circulation and body functions return to normal. Do not try to get up or walk alone the first time after surgery.   -If you develop swelling on one leg or the other, pain in the back of your leg, redness/warmth in one of your legs, please call the office or go to the Emergency Room to have a doppler to rule out a blood clot. For shortness of breath, chest pain-seek care in the Emergency Room as soon as possible. - Actively manage your pain. Managing your pain lets you move in comfort. We will ask you to rate your pain on a scale of zero to 10. It is your responsibility to tell your doctor or nurse where and how much you hurt so your pain can be treated.  Special Considerations -If you are diabetic, you may be placed on insulin after surgery to have closer control over your blood sugars to promote healing and recovery.  This does not mean that you will be discharged on insulin.  If applicable, your oral antidiabetics will be resumed when you are tolerating a solid diet.  -Your final pathology results from surgery should be  available around one week after surgery and the results will be relayed to you when available.  -Dr. Lahoma Crocker is the surgeon that assists your GYN Oncologist with surgery.  If you end up staying the night, the next day after your surgery you will either see Dr. Denman George, Dr. Berline Lopes, or Dr. Lahoma Crocker.  -FMLA forms can be faxed to [phone] and please allow 5-7 business days for completion.  Pain Management After Surgery -You will be prescribed your pain medication and bowel regimen medications  before surgery closer to the date so that you can have these available when you are discharged from the hospital. The pain medication is for use ONLY AFTER surgery and a new prescription will not be given.   -Make sure that you have Tylenol and Ibuprofen at home to use on a regular basis after surgery for pain control. We recommend alternating the medications every hour to six hours since they work differently and are processed in the body differently for pain relief.  -Review the attached handout on narcotic use and their risks and side effects.   Bowel Regimen -You will be prescribed Sennakot-S to take nightly to prevent constipation especially if you are taking the narcotic pain medication intermittently.  It is important to prevent constipation and drink adequate amounts of liquids. You can stop taking this medication when you are not taking pain medication and you are back on your normal bowel routine.  Risks of Surgery Risks of surgery are low but include bleeding, infection, damage to surrounding structures, re-operation, blood clots, and very rarely death.   Blood Transfusion Information (For the consent to be signed before surgery)  We will be checking your blood type before surgery so in case of emergencies, we will know what type of blood you would need.                                            WHAT IS A BLOOD TRANSFUSION?  A transfusion is the replacement of blood or some of its parts. Blood is made up of multiple cells which provide different functions.  Red blood cells carry oxygen and are used for blood loss replacement.  White blood cells fight against infection.  Platelets control bleeding.  Plasma helps clot blood.  Other blood products are available for specialized needs, such as hemophilia or other clotting disorders. BEFORE THE TRANSFUSION  Who gives blood for transfusions?   You may be able to donate blood to be used at a later date on yourself (autologous  donation).  Relatives can be asked to donate blood. This is generally not any safer than if you have received blood from a stranger. The same precautions are taken to ensure safety when a relative's blood is donated.  Healthy volunteers who are fully evaluated to make sure their blood is safe. This is blood bank blood. Transfusion therapy is the safest it has ever been in the practice of medicine. Before blood is taken from a donor, a complete history is taken to make sure that person has no history of diseases nor engages in risky social behavior (examples are intravenous drug use or sexual activity with multiple partners). The donor's travel history is screened to minimize risk of transmitting infections, such as malaria. The donated blood is tested for signs of infectious diseases, such as HIV and hepatitis. The blood is then tested  to be sure it is compatible with you in order to minimize the chance of a transfusion reaction. If you or a relative donates blood, this is often done in anticipation of surgery and is not appropriate for emergency situations. It takes many days to process the donated blood. RISKS AND COMPLICATIONS Although transfusion therapy is very safe and saves many lives, the main dangers of transfusion include:   Getting an infectious disease.  Developing a transfusion reaction. This is an allergic reaction to something in the blood you were given. Every precaution is taken to prevent this. The decision to have a blood transfusion has been considered carefully by your caregiver before blood is given. Blood is not given unless the benefits outweigh the risks.  AFTER SURGERY INSTRUCTIONS  Return to work: 4 weeks if applicable  Activity: 1. Be up and out of the bed during the day.  Take a nap if needed.  You may walk up steps but be careful and use the hand rail.  Stair climbing will tire you more than you think, you may need to stop part way and rest.   2. No lifting or  straining for 6 weeks over 10 pounds. No pushing, pulling, straining for 6 weeks.  3. No driving for 1 week(s).  Do not drive if you are taking narcotic pain medicine and make sure that your reaction time has returned.   4. You can shower as soon as the next day after surgery. Shower daily.  Use your regular soap and water (not directly on the incision) and pat your incision(s) dry afterwards; don't rub.  No tub baths or submerging your body in water until cleared by your surgeon. If you have the soap that was given to you by pre-surgical testing that was used before surgery, you do not need to use it afterwards because this can irritate your incisions.   5. No sexual activity and nothing in the vagina for 4 weeks.  6. You may experience a small amount of clear drainage from your incisions, which is normal.  If the drainage persists, increases, or changes color please call the office.  7. Do not use creams, lotions, or ointments such as neosporin on your incisions after surgery until advised by your surgeon because they can cause removal of the dermabond glue on your incisions.    8. You may experience vaginal spotting after surgery.  The spotting is normal but if you experience heavy bleeding, call our office.  9. Take Tylenol or ibuprofen first for pain and only use narcotic pain medication for severe pain not relieved by the Tylenol or Ibuprofen.  Monitor your Tylenol intake to a max of 4,000 mg in a 24 hour period. You can alternate these medications after surgery.  Diet: 1. Low sodium Heart Healthy Diet is recommended but you are cleared to resume your normal (before surgery) diet after your procedure.  2. It is safe to use a laxative, such as Miralax or Colace, if you have difficulty moving your bowels. You will be prescribed Sennakot at bedtime every evening to keep bowel movements regular and to prevent constipation.    Wound Care: 1. Keep clean and dry.  Shower daily.  Reasons to  call the Doctor:  Fever - Oral temperature greater than 100.4 degrees Fahrenheit  Foul-smelling vaginal discharge  Difficulty urinating  Nausea and vomiting  Increased pain at the site of the incision that is unrelieved with pain medicine.  Difficulty breathing with or without chest pain  New calf pain especially if only on one side  Sudden, continuing increased vaginal bleeding with or without clots.   Contacts: For questions or concerns you should contact:  Dr. Jeral Pinch at [phone]  Joylene John, NP at [phone]  After Hours: call [phone] and have the GYN Oncologist paged/contacted (after 5 pm or on the weekends)

## 2019-12-21 NOTE — Progress Notes (Signed)
GYNECOLOGIC ONCOLOGY NEW PATIENT CONSULTATION   Patient Name: Vanessa Allen  Patient Age: 54 y.o. Date of Service: 12/21/19 Referring Provider: Dr. Corinna Capra  Primary Care Provider: [name], [place] Consulting Provider: Jeral Pinch, MD   Assessment/Plan:  Postmenopausal patient with recently diagnosed estrogen receptor positive DCIS with a known pathogenic BRCA2 mutation.  Patient has done well during treatment for her DCIS and is status post radiation finished earlier this month.  She is now on tamoxifen for the last couple of weeks.  She is recovering from recent wrist surgery and has surgery scheduled for venous insufficiency in early December.  She then is traveling in the latter part of December and would like to delay scheduling any surgery with me until the new year.  The risk of ovarian cancer in patients with BRCA 2 mutations is approximately 10-15% to the age of 74.  The [employer]  (NCCN) recommends removal of bilateral ovaries and fallopian tubes between the ages of 69-40, and/or when childbearing is completed, to reduce the risk of ovarian and fallopian tube cancer.  In BRCA2 patients, it is reasonable to wait until 40-45 given immediate onset of ovarian cancer is later with this mutation.  If a patient does not undergo risk-reducing surgery, it is recommended they have CA-125 serum levels and pelvic ultrasounds every 6 months as a screening approach, although these are not particularly sensitive methods of screening.   Preventive surgery to remove the ovaries and fallopian tubes reduces the risk of a related cancer by 80% in women who carry a BRCA1 or BRCA2 mutation.  Women who undergo preventive surgery retain a 4% risk of developing cancer of the peritoneum.   We also discussed the pros and cons of removing the uterus. This is optional but her oncologist is treating her with tamoxifen currently.  We discussed the increased risk of hyperplasia and  cancer in patients being treated with tamoxifen.  While concurrent hysterectomy can ease the management of patients on tamoxifen, there is not a strong recommendation to the benefit of hysterectomy to decrease the risk of endometrial pathology.  Given her significant surgical history as well as history of endometriosis and what was described as a "frozen pelvis", I am concerned that concurrent hysterectomy could increase her risk surgically significantly.  My recommendation is to proceed with risk reducing BSO alone.  She is amenable with this plan.    We discussed surgery in January, when she is back, to include robotic bilateral salpingo-oophorectomy, possible laparotomy, and any other indicated procedures.  We discussed the increased risk of adhesive disease given her operative reports and thus increased risk of damage to surrounding structures due to adhesions.  Plan would be for an outpatient procedure unless she requires a large laparotomy or there is some complication at the time of surgery. The risks of surgery were discussed in detail and she understands these to include infection; wound separation; hernia; injury to adjacent organs such as bowel, bladder, blood vessels, ureters and nerves; bleeding which may require blood transfusion; anesthesia risk; thromboembolic events; possible death; unforeseen complications; possible need for re-exploration; medical complications such as heart attack, stroke, pleural effusion and pneumonia. The patient will receive DVT and antibiotic prophylaxis as indicated. She voiced a clear understanding. She had the opportunity to ask questions. Perioperative instructions were reviewed with her.   The patient has had a recent pelvic ultrasound with what are described as normal appearing ovaries.  I discussed getting a CA-125.  Given plan for upcoming  surgery and that this is neither a sensitive nor specific tumor marker, I do not feel strongly that this needs to be obtained  today.  A copy of this note was sent to the patient's referring provider.   60 minutes of total time was spent for this patient encounter, including preparation, face-to-face counseling with the patient and coordination of care, and documentation of the encounter.   Eugene Garnet, MD  Division of Gynecologic Oncology  Department of Obstetrics and Gynecology  [facility]  ___________________________________________  Chief Complaint: Chief Complaint  Patient presents with  . Breast cancer, BRCA2 positive, right (HCC)    History of Present Illness:  Vanessa Allen is a 54 y.o. y.o. female who is seen in consultation at the request of Dr. Rana Snare for an evaluation of risk reduction surgery in the setting of a BRCA2 mutation.  The patient had diagnostic mammogram in June showing abnormality in the right breast. Diagnostic mammogram was performed followed by a biopsy on 7/28 which showed DCIS, concern for possible microinvasive disease. She is s/p lumpectomy on 8/26 (no invasive disease, negative margins, negative sentinel lymph nodes) and adj RT (completed 11/10). ER/PR positive.  She started tamoxifen approximately 2 weeks ago and denies any significant side effects since that time.  She had genetic testing on 8/12, which showed a pathogenic BRCA2 gene mutation.  She is recovering from wrist surgery.  She has a history of uterine fibroids and endometriosis.  In 2007, she underwent laparotomy with myomectomies, lysis of adhesion, coagulation of endometriosis, evacuation of the left endometrioma, right ureterolysis, and excision of a left flank lipoma.  Findings at the time of surgery included 2 uterine fibroids measuring 5 x 4 cm and 8 x 12 cm.  There were extensive pelvic adhesions with the posterior cul-de-sac obliterated.  Sigmoid colon, uterus, both tubes and ovaries were extensively involved with the tubes and ovaries initially not recognizable.  The left  ovary was replaced with a large endometrioma measuring 10 x 12 cm which was encased with the uterus and sigmoid colon.  About 3 weeks after the surgery, she began having epigastric pain as well as emesis and was ultimately diagnosed with cholecystitis.  She was taken to the operating room and underwent laparoscopic cholecystectomy, with adhesions noted between the gallbladder and liver and some to the anterior abdominal wall.  Her appendix was located somewhat to the left in her lower abdomen and adhered to the sigmoid colon.  It did not appear inflamed, but an appendectomy was done because of the abnormal positioning of the appendix.  At that time she was healing very well from her recent pelvic surgery with no significant adhesions noted.  She had a long history of painful heavy periods with the diagnosis of uterine fibroids and endometriosis.  Since her menses stopped at the age of 7, she denies any issues including pelvic pain.  She had been on hormonal suppression and missed several doses.  This was at the age of 20.  She did not restart her medication and her periods stopped suddenly.  She notes occasional hot flashes now.  Patient lives in [place] by herself.  She works as a Tour manager.  She worked at [employer] from 2001-2005 and has been a Tour manager since that time.  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  PAST MEDICAL HISTORY:  Past Medical History:  Diagnosis Date  . BRCA2 gene mutation positive in female   . Cancer (HCC)  Right Breast Cancer  . Complication of anesthesia   . Diabetes mellitus without complication ([place])   . Family history of BRCA gene mutation   . Family history of breast cancer   . Family history of leukemia   . Family history of ovarian cancer   . Family history of stomach cancer   . GERD (gastroesophageal reflux disease)   . PONV (postoperative nausea and vomiting)   . Venous insufficiency of lower extremity      PAST  SURGICAL HISTORY:  Past Surgical History:  Procedure Laterality Date  . APPENDECTOMY    . BREAST LUMPECTOMY WITH RADIOACTIVE SEED AND SENTINEL LYMPH NODE BIOPSY Right 09/23/2019   Procedure: RIGHT BREAST LUMPECTOMY WITH RADIOACTIVE SEED AND SENTINEL LYMPH NODE MAPPING;  Surgeon: Erroll Luna, MD;  Location: [facility];  Service: General;  Laterality: Right;  PEC BLOCK  . CHOLECYSTECTOMY     lsc chole cystectomy and appendectomy 1 month after myomectomy  . CYST EXCISION Left 12/13/2019   Procedure: Excision left dorsal wrist ganglion cyst;  Surgeon: Cindra Presume, MD;  Location: [facility];  Service: Plastics;  Laterality: Left;  45 min  . Lipoma removal Left    Left Lower Back  . MYOMECTOMY    . WISDOM TOOTH EXTRACTION      OB/GYN HISTORY:  OB History  Gravida Para Term Preterm AB Living  0 0 0 0 0 0  SAB TAB Ectopic Multiple Live Births  0 0 0 0 0    No LMP recorded. Patient is postmenopausal.  Age at menarche: 38 Age at menopause: 43 Hx of HRT: Was briefly on HRT for insomnia Hx of STDs: Denies Last pap: 06/2018, negative History of abnormal pap smears: Denies  SCREENING STUDIES:  Last mammogram: 2021  Last colonoscopy: 2019  MEDICATIONS: Outpatient Encounter Medications as of 12/21/2019  Medication Sig  . ACCU-CHEK GUIDE test strip USE TWICE DAILY TO CHECK BLOOD SUGAR BEFORE BREAKFAST AND DINNER  . acidophilus (RISAQUAD) CAPS capsule Take 1 capsule by mouth daily.  Marland Kitchen aspirin EC 81 MG tablet Take 81 mg by mouth daily. Swallow whole.  Marland Kitchen atorvastatin (LIPITOR) 10 MG tablet TAKE 1 TABLET(10 MG) BY MOUTH DAILY (Patient taking differently: Take 10 mg by mouth every 7 (seven) days. )  . Biotin w/ Vitamins C & E (HAIR/SKIN/NAILS PO) Take 1 tablet by mouth daily.  . Coenzyme Q10 (CO Q-10) 100 MG CAPS Take by mouth.  . Continuous Blood Gluc Receiver (FREESTYLE LIBRE 14 DAY READER) DEVI USE TO CHECK BLOOD SUGAR BEFORE MEALS AND AT BEDTIME  . Continuous Blood Gluc  Sensor (FREESTYLE LIBRE 14 DAY SENSOR) MISC USE TO MEASURE BLOOD GLUCOSE FOUR TIMES DAILY BEFORE MEALS AND AT BEDTIME  . cyclobenzaprine (FLEXERIL) 10 MG tablet Take 10 mg by mouth at bedtime as needed for muscle spasms.   Marland Kitchen esomeprazole (NEXIUM) 20 MG capsule Take 1 capsule (20 mg total) by mouth daily.  . Glucose Blood (GLUCOSE METER TEST VI) by In Vitro route 2 (two) times daily.  . meloxicam (MOBIC) 15 MG tablet Take 15 mg by mouth daily as needed for pain.   . metFORMIN (GLUCOPHAGE) 500 MG tablet Take 1 tablet (500 mg total) by mouth daily with breakfast. TAKE 1 TABLET BY MOUTH EVERY DAY WITH BREAKFAST  . Multiple Vitamin (MULTIVITAMIN WITH MINERALS) TABS tablet Take 1 tablet by mouth daily.  . Omega-3 1000 MG CAPS Take 1,000 mg by mouth daily.  . Semaglutide (RYBELSUS) 14 MG TABS Take 1  tablet by mouth daily. Take 30 minutes before breakfast  . tamoxifen (NOLVADEX) 20 MG tablet Take 1 tablet (20 mg total) by mouth daily.  . Turmeric (QC TUMERIC COMPLEX PO) Take 1,000 mg by mouth daily.  . [DISCONTINUED] DAYVIGO 10 MG TABS Take by mouth.   . ALPRAZolam (XANAX) 0.5 MG tablet Take 1 tablet (0.5 mg total) by mouth 3 (three) times daily as needed for anxiety. (Patient not taking: Reported on 12/21/2019)  . amitriptyline (ELAVIL) 25 MG tablet Take 1 tablet (25 mg total) by mouth at bedtime. (Patient not taking: Reported on 12/21/2019)  . HYDROcodone-acetaminophen (NORCO/VICODIN) 5-325 MG tablet Take 1 tablet by mouth every 4 (four) hours as needed for severe pain. For AFTER surgery, do not take and drive  . ibuprofen (ADVIL) 800 MG tablet Take 1 tablet (800 mg total) by mouth every 8 (eight) hours as needed for moderate pain. For AFTER surgery  . senna-docusate (SENOKOT-S) 8.6-50 MG tablet Take 2 tablets by mouth at bedtime. For AFTER surgery, do not take if having diarrhea  . Suvorexant (BELSOMRA) 10 MG TABS Take 1 tablet by mouth at bedtime as needed. (Patient not taking: Reported on 12/21/2019)   . [DISCONTINUED] FARXIGA 10 MG TABS tablet Take 10 mg by mouth every morning.    No facility-administered encounter medications on file as of 12/21/2019.    ALLERGIES:  No Known Allergies   FAMILY HISTORY:  Family History  Problem Relation Age of Onset  . Hyperlipidemia Mother   . Hypertension Mother   . Varicose Veins Mother   . Diabetes Father   . Heart disease Father   . Stomach cancer Father 35  . Hypertension Brother   . Leukemia Paternal Aunt        dx. >50  . Breast cancer Niece 78  . BRCA 1/2 Niece   . Ovarian cancer Cousin        d. <50 (maternal first cousin)  . Ovarian cancer Cousin        d. <50 (maternal first cousin)  . Ovarian cancer Cousin        d. late 22s (maternal first cousin)  . Colon cancer Neg Hx   . Pancreatic cancer Neg Hx   . Prostate cancer Neg Hx      SOCIAL HISTORY:    Social Connections:   . Frequency of Communication with Friends and Family: Not on file  . Frequency of Social Gatherings with Friends and Family: Not on file  . Attends Religious Services: Not on file  . Active Member of Clubs or Organizations: Not on file  . Attends Archivist Meetings: Not on file  . Marital Status: Not on file    REVIEW OF SYSTEMS:  + Back pain Denies appetite changes, fevers, chills, fatigue, unexplained weight changes. Denies hearing loss, neck lumps or masses, mouth sores, ringing in ears or voice changes. Denies cough or wheezing.  Denies shortness of breath. Denies chest pain or palpitations. Denies leg swelling. Denies abdominal distention, pain, blood in stools, constipation, diarrhea, nausea, vomiting, or early satiety. Denies pain with intercourse, dysuria, frequency, hematuria or incontinence. Denies hot flashes, pelvic pain, vaginal bleeding or vaginal discharge.   Denies joint pain or muscle pain/cramps. Denies itching, rash, or wounds. Denies dizziness, headaches, numbness or seizures. Denies swollen lymph nodes or  glands, denies easy bruising or bleeding. Denies anxiety, depression, confusion, or decreased concentration.  Physical Exam:  Vital Signs for this encounter:  Blood pressure 124/84, pulse 93, temperature 97.8  F (36.6 C), temperature source Tympanic, resp. rate 18, height _0  (1.676 m), weight 184 lb 9.6 oz (83.7 kg), SpO2 98 %. Body mass index is 29.8 kg/m. General: Alert, oriented, no acute distress.  HEENT: Normocephalic, atraumatic. Sclera anicteric.  Chest: Unlabored breathing on room air. Abdomen: Normoactive bowel sounds. Soft, nondistended, nontender to palpation. No masses or hepatosplenomegaly appreciated. No palpable fluid wave.  Well-healed laparoscopic and Pfannenstiel incisions. Extremities: Grossly normal range of motion. Warm, well perfused. No edema bilaterally.  Skin: No rashes or lesions.  Lymphatics: No cervical, supraclavicular, or inguinal adenopathy.  GU:  Normal external female genitalia.  No lesions. No discharge or bleeding.             Bladder/urethra:  No lesions or masses, well supported bladder             Bimanual exam: cervix without masses or lesions.  Uterus is moderately mobile, no adnexal masses appreciated.  LABORATORY AND RADIOLOGIC DATA:  Outside medical records were reviewed to synthesize the above history, along with the history and physical obtained during the visit.   Lab Results  Component Value Date   WBC 6.2 09/16/2019   HGB 13.3 09/16/2019   HCT 41.4 09/16/2019   PLT 268 09/16/2019   GLUCOSE 122 (H) 12/10/2019   CHOL 166 07/26/2019   TRIG 150 (H) 07/26/2019   HDL 42 07/26/2019   LDLCALC 98 07/26/2019   ALT 33 09/16/2019   AST 30 09/16/2019   NA 142 12/10/2019   K 4.0 12/10/2019   CL 109 12/10/2019   CREATININE 0.95 12/10/2019   BUN 14 12/10/2019   CO2 25 12/10/2019   TSH 1.050 04/02/2018   HGBA1C 6.4 (H) 09/16/2019   MICROALBUR 10 10/26/2019   [facility]:  11/11/2019 Uterus measures 4.4 x 2.3 x 4.3 cm with an endometrial lining measuring 1.7 mm.  Normal size of bilateral ovaries.  4.1 x 6.6 cm cystic structure within the left adnexa suspected to be a hydrosalpinx.  No free fluid seen.

## 2019-12-22 ENCOUNTER — Encounter: Payer: Self-pay | Admitting: Plastic Surgery

## 2019-12-22 ENCOUNTER — Other Ambulatory Visit: Payer: Self-pay

## 2019-12-22 ENCOUNTER — Other Ambulatory Visit: Payer: Self-pay | Admitting: Gynecologic Oncology

## 2019-12-22 ENCOUNTER — Ambulatory Visit (INDEPENDENT_AMBULATORY_CARE_PROVIDER_SITE_OTHER): Payer: BC Managed Care – PPO | Admitting: Plastic Surgery

## 2019-12-22 VITALS — BP 132/80 | HR 88 | Temp 99.0°F

## 2019-12-22 DIAGNOSIS — M67439 Ganglion, unspecified wrist: Secondary | ICD-10-CM

## 2019-12-22 DIAGNOSIS — C50911 Malignant neoplasm of unspecified site of right female breast: Secondary | ICD-10-CM

## 2019-12-22 NOTE — Progress Notes (Signed)
Patient is postop from excision of a left dorsal wrist ganglion cyst.  Pathology was reviewed consistent with wrist ganglion.  She is feeling reasonably well with a little bit dull pain in her wrist and hand but no other issues.  On exam the Steri-Strips were removed and the incision is healing nicely.  There is no swelling.  She has good flexion and extension of all fingers and the dorsal surface of the hand shows normal sensation.  I have her follow-up again in 3 to 4 weeks to check her progress.  All her questions were answered.

## 2019-12-30 ENCOUNTER — Encounter: Payer: Self-pay | Admitting: Vascular Surgery

## 2019-12-30 ENCOUNTER — Ambulatory Visit (INDEPENDENT_AMBULATORY_CARE_PROVIDER_SITE_OTHER): Payer: BC Managed Care – PPO | Admitting: Vascular Surgery

## 2019-12-30 ENCOUNTER — Other Ambulatory Visit: Payer: Self-pay

## 2019-12-30 VITALS — BP 111/81 | HR 110 | Temp 98.1°F | Resp 16 | Ht 66.0 in | Wt 185.0 lb

## 2019-12-30 DIAGNOSIS — I872 Venous insufficiency (chronic) (peripheral): Secondary | ICD-10-CM

## 2019-12-30 HISTORY — PX: ENDOVENOUS ABLATION SAPHENOUS VEIN W/ LASER: SUR449

## 2019-12-30 NOTE — Progress Notes (Signed)
Patient name: Vanessa Allen MRN: [id number] DOB: [birth year] Sex: female  REASON FOR VISIT: For laser ablation of the left great saphenous vein  HPI: Vanessa Allen is a 54 y.o. female who has had recurrent venous ulcers of the left leg.  She had previously been seen by Dr. Trula Slade.  I saw the patient on 11/04/2019.  She had CEAP C5 venous disease.  She had deep venous reflux and superficial venous reflux on the left.  The left great saphenous vein was incompetent from the saphenofemoral junction to the distal calf.  Diameters range from 0.74 in the proximal calf to 0.6 in the proximal thigh.  She had failed conservative treatment and for this reason I felt she was a candidate for laser ablation of the left great saphenous vein.  I felt that I could cannulate the vein in the proximal calf.  Of note she had previous arterial studies in August of this year which showed normal ABIs and normal toe pressures.  Current Outpatient Medications  Medication Sig Dispense Refill  . ACCU-CHEK GUIDE test strip USE TWICE DAILY TO CHECK BLOOD SUGAR BEFORE BREAKFAST AND DINNER 100 strip 5  . acidophilus (RISAQUAD) CAPS capsule Take 1 capsule by mouth daily.    Marland Kitchen ALPRAZolam (XANAX) 0.5 MG tablet Take 1 tablet (0.5 mg total) by mouth 3 (three) times daily as needed for anxiety. 30 tablet 1  . amitriptyline (ELAVIL) 25 MG tablet Take 1 tablet (25 mg total) by mouth at bedtime. 30 tablet 3  . aspirin EC 81 MG tablet Take 81 mg by mouth daily. Swallow whole.    Marland Kitchen atorvastatin (LIPITOR) 10 MG tablet TAKE 1 TABLET(10 MG) BY MOUTH DAILY (Patient taking differently: Take 10 mg by mouth every 7 (seven) days. ) 90 tablet 1  . Biotin w/ Vitamins C & E (HAIR/SKIN/NAILS PO) Take 1 tablet by mouth daily.    . Coenzyme Q10 (CO Q-10) 100 MG CAPS Take by mouth.    . Continuous Blood Gluc Receiver (FREESTYLE LIBRE 14 DAY READER) DEVI USE TO CHECK BLOOD SUGAR BEFORE MEALS AND AT BEDTIME 1 each 2  . Continuous Blood Gluc Sensor  (FREESTYLE LIBRE 14 DAY SENSOR) MISC USE TO MEASURE BLOOD GLUCOSE FOUR TIMES DAILY BEFORE MEALS AND AT BEDTIME 2 each 11  . cyclobenzaprine (FLEXERIL) 10 MG tablet Take 10 mg by mouth at bedtime as needed for muscle spasms.     Marland Kitchen esomeprazole (NEXIUM) 20 MG capsule Take 1 capsule (20 mg total) by mouth daily. 90 capsule 1  . Glucose Blood (GLUCOSE METER TEST VI) by In Vitro route 2 (two) times daily.    Marland Kitchen HYDROcodone-acetaminophen (NORCO/VICODIN) 5-325 MG tablet Take 1 tablet by mouth every 4 (four) hours as needed for severe pain. For AFTER surgery, do not take and drive 10 tablet 0  . ibuprofen (ADVIL) 800 MG tablet Take 1 tablet (800 mg total) by mouth every 8 (eight) hours as needed for moderate pain. For AFTER surgery 30 tablet 0  . LORazepam (ATIVAN) 1 MG tablet Take 1 tablet 30 minutes prior to leaving house of on the day of office surgery and bring the second tablet with you to the office. 2 tablet 0  . meloxicam (MOBIC) 15 MG tablet Take 15 mg by mouth daily as needed for pain.     . metFORMIN (GLUCOPHAGE) 500 MG tablet Take 1 tablet (500 mg total) by mouth daily with breakfast. TAKE 1 TABLET BY MOUTH EVERY DAY WITH BREAKFAST 90 tablet 1  .  Multiple Vitamin (MULTIVITAMIN WITH MINERALS) TABS tablet Take 1 tablet by mouth daily.    . Omega-3 1000 MG CAPS Take 1,000 mg by mouth daily.    . Semaglutide (RYBELSUS) 14 MG TABS Take 1 tablet by mouth daily. Take 30 minutes before breakfast 90 tablet 0  . senna-docusate (SENOKOT-S) 8.6-50 MG tablet Take 2 tablets by mouth at bedtime. For AFTER surgery, do not take if having diarrhea 30 tablet 0  . Suvorexant (BELSOMRA) 10 MG TABS Take 1 tablet by mouth at bedtime as needed. 30 tablet 0  . tamoxifen (NOLVADEX) 20 MG tablet Take 1 tablet (20 mg total) by mouth daily. 90 tablet 12  . Turmeric (QC TUMERIC COMPLEX PO) Take 1,000 mg by mouth daily.     No current facility-administered medications for this visit.    PHYSICAL EXAM: Vitals:   12/30/19  0841  BP: 111/81  Pulse: (!) 110  Resp: 16  Temp: 98.1 F (36.7 C)  TempSrc: Temporal  SpO2: 96%  Weight: 185 lb (83.9 kg)  Height: 5\' 6"  (1.676 m)    PROCEDURE: Laser ablation left great saphenous vein  TECHNIQUE: The patient was taken to the exam room and placed supine on the exam table.  I looked at the left great saphenous vein myself with the SonoSite.  I felt I could cannulate this in the proximal calf.  Left leg was prepped and draped in usual sterile fashion.  Under ultrasound guidance, after the skin was anesthetized, I cannulated the left great saphenous vein in the proximal calf and a micropuncture sheath was introduced over the wire.  I then advanced the J-wire to just below the saphenofemoral junction.  A 65 cm sheath was then advanced over the wire and the wire and dilator were removed after the sheath was positioned 2-1/2 cm distal to the saphenofemoral junction.  I then advanced the laser fiber and into the sheath and retracted the sheath exposing the laser fiber.  This was again positioned 2.5 cm distal to the saphenofemoral junction.  Next tumescent anesthesia was administered circumferentially around the vein up to the saphenofemoral junction.  The patient was then placed in Trendelenburg.  Laser glasses were placed on.  Laser ablation was performed of the left great saphenous vein from 2 and half centimeters distal to the saphenofemoral junction to the proximal calf.  1725 J of energy were administered.  A pressure dressing was then applied.  The patient will return in 2 weeks for follow-up duplex.  Deitra Mayo [facility] [phone]

## 2019-12-30 NOTE — Progress Notes (Signed)
     Laser Ablation Procedure    Date: 12/30/2019   Vanessa Allen DOB:02-22-1965  Consent signed: Yes      Surgeon: Gae Gallop MD   Procedure: Laser Ablation: left Greater Saphenous Vein  BP 111/81 (BP Location: Left Arm, Patient Position: Sitting, Cuff Size: Large)   Pulse (!) 110   Temp 98.1 F (36.7 C) (Temporal)   Resp 16   Ht 5\' 6"  (1.676 m)   Wt 185 lb (83.9 kg)   SpO2 96%   BMI 29.86 kg/m   Tumescent Anesthesia: 475 cc 0.9% NaCl with 50 cc Lidocaine HCL 1%  and 15 cc 8.4% NaHCO3  Local Anesthesia: 3 cc Lidocaine HCL and NaHCO3 (ratio 2:1)  7 watts continuous mode     Total energy: 1725 Joules    Total time: 246 seconds Treatment Length 40 cm  Laser Fiber Ref. #  [id number]                             Lot # [id number]     Patient tolerated procedure well  Notes: Patient wore face mask.  All staff members wore facial masks and facial shields/goggles.  Ativan 1 mg taken on 12-30-2019 at 8:10 AM.   Description of Procedure:  After marking the course of the secondary varicosities, the patient was placed on the operating table in the supine position, and the left leg was prepped and draped in sterile fashion.   Local anesthetic was administered and under ultrasound guidance the saphenous vein was accessed with a micro needle and guide wire; then the mirco puncture sheath was placed.  A guide wire was inserted saphenofemoral junction , followed by a 5 french sheath.  The position of the sheath and then the laser fiber below the junction was confirmed using the ultrasound.  Tumescent anesthesia was administered along the course of the saphenous vein using ultrasound guidance. The patient was placed in Trendelenburg position and protective laser glasses were placed on patient and staff, and the laser was fired at 7 watts continuous mode for a total of 1725 joules.      Steri strip were applied to the IV insertion site and ABD pads and thigh high compression stockings  were applied.  Ace wrap bandages were applied over the left thigh and at the top of the saphenofemoral junction. Blood loss was less than 15 cc.  Discharge instructions reviewed with patient and hardcopy of discharge instructions given to patient to take home. The patient ambulated out of the operating room having tolerated the procedure well.

## 2019-12-31 ENCOUNTER — Encounter: Payer: Self-pay | Admitting: Nurse Practitioner

## 2020-01-01 NOTE — Progress Notes (Signed)
  Radiation Oncology         [phone] ________________________________  Name: Vanessa Allen MRN: [id number]  Date: 12/08/2019  DOB: 12/23/1965  End of Treatment Note  Diagnosis:   right-sided breast cancer     Indication for treatment:  Curative       Radiation treatment dates:   10/25/19 - 12/08/19  Site/dose:   The patient initially received a dose of 50.4 Gy in 28 fractions to the breast using whole-breast tangent fields. This was delivered using a 3-D conformal technique. The patient then received a boost to the seroma. This delivered an additional 10 Gy in 5 fractions using a 3-field photon boost technique. The total dose was 60.4 Gy.  Narrative: The patient tolerated radiation treatment relatively well.   The patient had some expected skin irritation as she progressed during treatment.    Plan: The patient has completed radiation treatment. The patient will return to radiation oncology clinic for routine followup in one month. I advised the patient to call or return sooner if they have any questions or concerns related to their recovery or treatment. ________________________________  Jodelle Gross, M.D., Ph.D.

## 2020-01-03 ENCOUNTER — Telehealth: Payer: Self-pay | Admitting: Radiation Oncology

## 2020-01-03 NOTE — Telephone Encounter (Signed)
  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  Interval Since Last Radiation: 4 weeks   10/25/19 - 12/08/19: The patient initially received a dose of 50.4 Gy in 28 fractions to the right breast using whole-breast tangent fields. This was delivered using a 3-D conformal technique. The patient then received a boost to the seroma. This delivered an additional 10 Gy in 5 fractions using a 3-field photon boost technique. The total dose was 60.4 Gy.  Narrative:  The patient was contacted today for routine follow-up. During treatment she did very well with radiotherapy and did not have significant desquamation. She reports she is doing well she does have some shooting pains in her breast and sensitivity in her underarm when applying deodorant. She denies any swelling of her breast or chest.   Impression/Plan: 1. 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 The patient has been doing well since completion of radiotherapy. We discussed that we would be happy to continue to follow her as needed, but she will also continue to follow up with Dr. Jana Hakim in medical oncology. She was counseled on skin care as well as measures to avoid sun exposure to this area. She will let us know if her symptoms progress and it could be helpful at that point for PT to weigh in. 2. Survivorship. We discussed the importance of survivorship evaluation and encouraged her to attend her upcoming visit with that clinic.    Carola Rhine, PAC

## 2020-01-07 NOTE — Progress Notes (Deleted)
Patient is a 54 year old female here for follow-up after undergoing excision of dorsal left wrist ganglion cyst on 12/13/2019 with Dr. Claudia Desanctis.  Pathology was consistent with wrist ganglion cyst.  At last visit on 11/24 the incision was healing nicely.  She had good flexion and extension of all fingers and the dorsal surface of the hand had normal sensation.  She had a little dull pain in her wrist and hand but no other issues.  ~ 4 weeks PO

## 2020-01-10 ENCOUNTER — Other Ambulatory Visit: Payer: Self-pay | Admitting: Nurse Practitioner

## 2020-01-10 DIAGNOSIS — G4700 Insomnia, unspecified: Secondary | ICD-10-CM

## 2020-01-10 DIAGNOSIS — L723 Sebaceous cyst: Secondary | ICD-10-CM | POA: Diagnosis not present

## 2020-01-10 DIAGNOSIS — L089 Local infection of the skin and subcutaneous tissue, unspecified: Secondary | ICD-10-CM | POA: Diagnosis not present

## 2020-01-11 NOTE — Telephone Encounter (Signed)
Please refill patient's prescription YL,RMA 

## 2020-01-12 ENCOUNTER — Ambulatory Visit: Payer: BC Managed Care – PPO | Admitting: Plastic Surgery

## 2020-01-12 DIAGNOSIS — L723 Sebaceous cyst: Secondary | ICD-10-CM | POA: Diagnosis not present

## 2020-01-12 DIAGNOSIS — L089 Local infection of the skin and subcutaneous tissue, unspecified: Secondary | ICD-10-CM | POA: Diagnosis not present

## 2020-01-12 DIAGNOSIS — M67439 Ganglion, unspecified wrist: Secondary | ICD-10-CM

## 2020-01-13 ENCOUNTER — Ambulatory Visit (HOSPITAL_COMMUNITY)
Admission: RE | Admit: 2020-01-13 | Discharge: 2020-01-13 | Disposition: A | Discharge status: Home / Self Care | Payer: BC Managed Care – PPO | Source: Ambulatory Visit | Attending: Vascular Surgery | Admitting: Vascular Surgery

## 2020-01-13 ENCOUNTER — Other Ambulatory Visit: Payer: Self-pay

## 2020-01-13 ENCOUNTER — Ambulatory Visit (INDEPENDENT_AMBULATORY_CARE_PROVIDER_SITE_OTHER): Payer: BC Managed Care – PPO | Admitting: Vascular Surgery

## 2020-01-13 ENCOUNTER — Encounter: Payer: Self-pay | Admitting: Vascular Surgery

## 2020-01-13 VITALS — BP 110/75 | HR 95 | Temp 97.7°F | Resp 16 | Ht 66.0 in | Wt 180.0 lb

## 2020-01-13 DIAGNOSIS — I83812 Varicose veins of left lower extremities with pain: Secondary | ICD-10-CM | POA: Insufficient documentation

## 2020-01-13 DIAGNOSIS — I872 Venous insufficiency (chronic) (peripheral): Secondary | ICD-10-CM

## 2020-01-13 DIAGNOSIS — Z20822 Contact with and (suspected) exposure to covid-19: Secondary | ICD-10-CM | POA: Diagnosis not present

## 2020-01-13 MED ORDER — RIVAROXABAN (XARELTO) VTE STARTER PACK (15 & 20 MG): ORAL_TABLET | ORAL | 0 refills | Status: DC

## 2020-01-13 NOTE — Progress Notes (Signed)
Patient name: Vanessa Allen MRN: [id number] DOB: [birth year] Sex: female  REASON FOR VISIT: Follow-up after endovenous laser ablation of the left great saphenous vein  HPI: Vanessa Allen is a 54 y.o. female who had presented with recurrent venous ulcers of the left leg.  She had CEAP C5 venous disease.  Patient had both deep and superficial venous reflux.  She had failed conservative treatment and was felt to be a good candidate for laser ablation left great saphenous vein.  On 12/30/2019 she underwent laser ablation of the left great saphenous vein from 2.5 cm distal to the saphenofemoral junction to the proximal calf.  Patient is doing well.  Her only complaint was some mild pain in the popliteal space which is resolved.  She is had no significant leg swelling.  She denies any chest pain or shortness of breath.  Current Outpatient Medications  Medication Sig Dispense Refill  . ACCU-CHEK GUIDE test strip USE TWICE DAILY TO CHECK BLOOD SUGAR BEFORE BREAKFAST AND DINNER 100 strip 5  . acidophilus (RISAQUAD) CAPS capsule Take 1 capsule by mouth daily.    Marland Kitchen ALPRAZolam (XANAX) 0.5 MG tablet Take 1 tablet (0.5 mg total) by mouth 3 (three) times daily as needed for anxiety. 30 tablet 1  . aspirin EC 81 MG tablet Take 81 mg by mouth daily. Swallow whole.    Marland Kitchen atorvastatin (LIPITOR) 10 MG tablet TAKE 1 TABLET(10 MG) BY MOUTH DAILY (Patient taking differently: Take 10 mg by mouth every 7 (seven) days.) 90 tablet 1  . BELSOMRA 10 MG TABS TAKE 1 TABLET BY MOUTH EVERY NIGHT AT BEDTIME AS NEEDED 30 tablet 2  . Biotin w/ Vitamins C & E (HAIR/SKIN/NAILS PO) Take 1 tablet by mouth daily.    . Coenzyme Q10 (CO Q-10) 100 MG CAPS Take by mouth.    . Continuous Blood Gluc Receiver (FREESTYLE LIBRE 14 DAY READER) DEVI USE TO CHECK BLOOD SUGAR BEFORE MEALS AND AT BEDTIME 1 each 2  . Continuous Blood Gluc Sensor (FREESTYLE LIBRE 14 DAY SENSOR) MISC USE TO MEASURE BLOOD GLUCOSE FOUR TIMES DAILY BEFORE MEALS AND  AT BEDTIME 2 each 11  . cyclobenzaprine (FLEXERIL) 10 MG tablet Take 10 mg by mouth at bedtime as needed for muscle spasms.     Marland Kitchen esomeprazole (NEXIUM) 20 MG capsule Take 1 capsule (20 mg total) by mouth daily. 90 capsule 1  . Glucose Blood (GLUCOSE METER TEST VI) by In Vitro route 2 (two) times daily.    Marland Kitchen ibuprofen (ADVIL) 800 MG tablet Take 1 tablet (800 mg total) by mouth every 8 (eight) hours as needed for moderate pain. For AFTER surgery 30 tablet 0  . LORazepam (ATIVAN) 1 MG tablet Take 1 tablet 30 minutes prior to leaving house of on the day of office surgery and bring the second tablet with you to the office. 2 tablet 0  . meloxicam (MOBIC) 15 MG tablet Take 15 mg by mouth daily as needed for pain.     . metFORMIN (GLUCOPHAGE) 500 MG tablet Take 1 tablet (500 mg total) by mouth daily with breakfast. TAKE 1 TABLET BY MOUTH EVERY DAY WITH BREAKFAST 90 tablet 1  . Multiple Vitamin (MULTIVITAMIN WITH MINERALS) TABS tablet Take 1 tablet by mouth daily.    . Omega-3 1000 MG CAPS Take 1,000 mg by mouth daily.    . Semaglutide (RYBELSUS) 14 MG TABS Take 1 tablet by mouth daily. Take 30 minutes before breakfast 90 tablet 0  . senna-docusate (SENOKOT-S)  8.6-50 MG tablet Take 2 tablets by mouth at bedtime. For AFTER surgery, do not take if having diarrhea 30 tablet 0  . Turmeric (QC TUMERIC COMPLEX PO) Take 1,000 mg by mouth daily.    Marland Kitchen amitriptyline (ELAVIL) 25 MG tablet Take 1 tablet (25 mg total) by mouth at bedtime. (Patient not taking: Reported on 01/13/2020) 30 tablet 3  . HYDROcodone-acetaminophen (NORCO/VICODIN) 5-325 MG tablet Take 1 tablet by mouth every 4 (four) hours as needed for severe pain. For AFTER surgery, do not take and drive (Patient not taking: Reported on 01/13/2020) 10 tablet 0   No current facility-administered medications for this visit.   REVIEW OF SYSTEMS: Valu.Nieves ] denotes positive finding; [  ] denotes negative finding  CARDIOVASCULAR:  [ ]  chest pain   [ ]  dyspnea on  exertion  [ ]  leg swelling  CONSTITUTIONAL:  [ ]  fever   [ ]  chills  PHYSICAL EXAM: Vitals:   01/13/20 1044  Resp: 16  Weight: 180 lb (81.6 kg)  Height: 5\' 6"  (1.676 m)   GENERAL: The patient is a well-nourished female, in no acute distress. The vital signs are documented above. CARDIOVASCULAR: There is a regular rate and rhythm. PULMONARY: There is good air exchange bilaterally without wheezing or rales. VASCULAR: She has no significant leg swelling.  She has no significant bruising.  DATA:  VENOUS DUPLEX: I have independently interpreted her venous duplex scan.  She has successful closure of the left great saphenous vein from the saphenofemoral junction to the proximal calf.  The clot extends into the common femoral vein (EHIT-3).   MEDICAL ISSUES:  S/P LASER ABLATION LEFT GREAT SAPHENOUS VEIN: Patient has undergone successful laser ablation of the left great saphenous vein.  Given the clot extension into the common femoral vein (EHIT-3), I have recommended Xarelto for 1 month.  I will see her back in 1 month and will get a follow-up duplex scan.  If the clot is completely resolved at that point we will stop it.  If she has any residual clot we may continue the Xarelto for another 4 to 6 weeks.  We have also discussed the importance of leg elevation and staying active.  She will continue to wear knee-high compression stockings.  She has a flight to [place] [employer] next week and we discussed the importance of wearing her compression stockings on the flight and walking during the flight and staying hydrated.  Deitra Mayo [facility] [phone]

## 2020-01-14 ENCOUNTER — Other Ambulatory Visit: Payer: Self-pay

## 2020-01-14 DIAGNOSIS — I872 Venous insufficiency (chronic) (peripheral): Secondary | ICD-10-CM

## 2020-01-15 DIAGNOSIS — G4733 Obstructive sleep apnea (adult) (pediatric): Secondary | ICD-10-CM | POA: Diagnosis not present

## 2020-02-03 ENCOUNTER — Encounter: Payer: Self-pay | Admitting: Gynecologic Oncology

## 2020-02-03 ENCOUNTER — Telehealth: Payer: Self-pay | Admitting: *Deleted

## 2020-02-03 ENCOUNTER — Other Ambulatory Visit: Payer: Self-pay | Admitting: Gynecologic Oncology

## 2020-02-03 DIAGNOSIS — Z1501 Genetic susceptibility to malignant neoplasm of breast: Secondary | ICD-10-CM

## 2020-02-03 DIAGNOSIS — C50911 Malignant neoplasm of unspecified site of right female breast: Secondary | ICD-10-CM

## 2020-02-03 NOTE — Telephone Encounter (Signed)
Per Dr Pricilla Holm scheduled the patient for a lab appt

## 2020-02-03 NOTE — Telephone Encounter (Signed)
Patient called and left a message stating "I want to postpone my surgery to either February or March. Returned the patient's call and explained that "Dr Pricilla Holm and Efraim Kaufmann are not in the office today but the message will be sent and someone will call her back."

## 2020-02-04 ENCOUNTER — Encounter: Payer: Self-pay | Admitting: Gynecologic Oncology

## 2020-02-07 ENCOUNTER — Other Ambulatory Visit: Payer: Self-pay

## 2020-02-07 ENCOUNTER — Inpatient Hospital Stay: Payer: BC Managed Care – PPO | Attending: Oncology

## 2020-02-07 DIAGNOSIS — D0511 Intraductal carcinoma in situ of right breast: Secondary | ICD-10-CM | POA: Diagnosis not present

## 2020-02-07 DIAGNOSIS — C50911 Malignant neoplasm of unspecified site of right female breast: Secondary | ICD-10-CM

## 2020-02-08 ENCOUNTER — Telehealth: Payer: Self-pay | Admitting: *Deleted

## 2020-02-08 ENCOUNTER — Encounter: Payer: Self-pay | Admitting: Oncology

## 2020-02-08 LAB — CA 125: Cancer Antigen (CA) 125: 35.5 U/mL (ref 0.0–38.1)

## 2020-02-09 ENCOUNTER — Encounter: Payer: Self-pay | Admitting: *Deleted

## 2020-02-10 ENCOUNTER — Encounter (HOSPITAL_COMMUNITY): Admission: RE | Admit: 2020-02-10 | Payer: BC Managed Care – PPO | Source: Ambulatory Visit

## 2020-02-11 NOTE — Telephone Encounter (Signed)
No entry 

## 2020-02-15 DIAGNOSIS — G4733 Obstructive sleep apnea (adult) (pediatric): Secondary | ICD-10-CM | POA: Diagnosis not present

## 2020-02-17 ENCOUNTER — Encounter: Payer: Self-pay | Admitting: Vascular Surgery

## 2020-02-17 ENCOUNTER — Ambulatory Visit (HOSPITAL_COMMUNITY)
Admission: RE | Admit: 2020-02-17 | Discharge: 2020-02-17 | Disposition: A | Discharge status: Home / Self Care | Payer: BC Managed Care – PPO | Source: Ambulatory Visit | Attending: Vascular Surgery | Admitting: Vascular Surgery

## 2020-02-17 ENCOUNTER — Other Ambulatory Visit: Payer: Self-pay

## 2020-02-17 ENCOUNTER — Ambulatory Visit (INDEPENDENT_AMBULATORY_CARE_PROVIDER_SITE_OTHER): Payer: BC Managed Care – PPO | Admitting: Vascular Surgery

## 2020-02-17 VITALS — BP 103/71 | HR 104 | Temp 97.9°F | Resp 16 | Ht 66.0 in | Wt 178.0 lb

## 2020-02-17 DIAGNOSIS — I872 Venous insufficiency (chronic) (peripheral): Secondary | ICD-10-CM

## 2020-02-17 NOTE — Progress Notes (Signed)
Patient name: Vanessa Allen MRN: [id number] DOB: 1965/08/15 Sex: female  REASON FOR VISIT:   Follow-up after laser ablation of the left great saphenous vein  HPI:   Vanessa Allen is a pleasant 55 y.o. female who had presented with recurrent venous ulcers of the left leg.  She had CEAP C5 venous disease.  She had both superficial and deep venous reflux.  She had failed conservative treatment and was felt to be a good candidate for laser ablation of the left great saphenous vein.  She underwent laser ablation of the left great saphenous vein on 12/30/2019 2-1/2 cm distal to the saphenofemoral junction of the proximal calf.  I saw her in follow-up on 01/13/2020.  Her follow-up duplex scan showed successful closure of the left great saphenous vein with an EHIT-3.  I recommended 1 month of Xarelto and she comes in for a follow-up duplex.  Since I saw her last she states she has had no complaints or swelling in the left leg.  She went to visit her mother in [place] and had a good trip without any problems.  She denies chest pain or shortness of breath.  Current Outpatient Medications  Medication Sig Dispense Refill  . ACCU-CHEK GUIDE test strip USE TWICE DAILY TO CHECK BLOOD SUGAR BEFORE BREAKFAST AND DINNER 100 strip 5  . acidophilus (RISAQUAD) CAPS capsule Take 1 capsule by mouth daily.    Marland Kitchen ALPRAZolam (XANAX) 0.5 MG tablet Take 1 tablet (0.5 mg total) by mouth 3 (three) times daily as needed for anxiety. 30 tablet 1  . aspirin EC 81 MG tablet Take 81 mg by mouth daily. Swallow whole.    Marland Kitchen atorvastatin (LIPITOR) 10 MG tablet TAKE 1 TABLET(10 MG) BY MOUTH DAILY (Patient taking differently: Take 10 mg by mouth every 7 (seven) days.) 90 tablet 1  . BELSOMRA 10 MG TABS TAKE 1 TABLET BY MOUTH EVERY NIGHT AT BEDTIME AS NEEDED 30 tablet 2  . Biotin w/ Vitamins C & E (HAIR/SKIN/NAILS PO) Take 1 tablet by mouth daily.    . Coenzyme Q10 (CO Q-10) 100 MG CAPS Take by mouth.    . Continuous Blood  Gluc Receiver (FREESTYLE LIBRE 14 DAY READER) DEVI USE TO CHECK BLOOD SUGAR BEFORE MEALS AND AT BEDTIME 1 each 2  . Continuous Blood Gluc Sensor (FREESTYLE LIBRE 14 DAY SENSOR) MISC USE TO MEASURE BLOOD GLUCOSE FOUR TIMES DAILY BEFORE MEALS AND AT BEDTIME 2 each 11  . cyclobenzaprine (FLEXERIL) 10 MG tablet Take 10 mg by mouth at bedtime as needed for muscle spasms.     Marland Kitchen esomeprazole (NEXIUM) 20 MG capsule Take 1 capsule (20 mg total) by mouth daily. 90 capsule 1  . Glucose Blood (GLUCOSE METER TEST VI) by In Vitro route 2 (two) times daily.    Marland Kitchen ibuprofen (ADVIL) 800 MG tablet Take 1 tablet (800 mg total) by mouth every 8 (eight) hours as needed for moderate pain. For AFTER surgery 30 tablet 0  . LORazepam (ATIVAN) 1 MG tablet Take 1 tablet 30 minutes prior to leaving house of on the day of office surgery and bring the second tablet with you to the office. 2 tablet 0  . meloxicam (MOBIC) 15 MG tablet Take 15 mg by mouth daily as needed for pain.     . metFORMIN (GLUCOPHAGE) 500 MG tablet Take 1 tablet (500 mg total) by mouth daily with breakfast. TAKE 1 TABLET BY MOUTH EVERY DAY WITH BREAKFAST 90 tablet 1  . Multiple  Vitamin (MULTIVITAMIN WITH MINERALS) TABS tablet Take 1 tablet by mouth daily.    . Omega-3 1000 MG CAPS Take 1,000 mg by mouth daily.    Marland Kitchen RIVAROXABAN (XARELTO) VTE STARTER PACK (15 & 20 MG) Follow package directions: Take one 15mg  tablet by mouth twice a day. On day 22, switch to one 20mg  tablet once a day. Take with food. 51 each 0  . Semaglutide (RYBELSUS) 14 MG TABS Take 1 tablet by mouth daily. Take 30 minutes before breakfast 90 tablet 0  . senna-docusate (SENOKOT-S) 8.6-50 MG tablet Take 2 tablets by mouth at bedtime. For AFTER surgery, do not take if having diarrhea 30 tablet 0  . Turmeric (QC TUMERIC COMPLEX PO) Take 1,000 mg by mouth daily.    Marland Kitchen amitriptyline (ELAVIL) 25 MG tablet Take 1 tablet (25 mg total) by mouth at bedtime. (Patient not taking: No sig reported) 30 tablet  3  . HYDROcodone-acetaminophen (NORCO/VICODIN) 5-325 MG tablet Take 1 tablet by mouth every 4 (four) hours as needed for severe pain. For AFTER surgery, do not take and drive (Patient not taking: No sig reported) 10 tablet 0   No current facility-administered medications for this visit.    REVIEW OF SYSTEMS:  [X]  denotes positive finding, [ ]  denotes negative finding Vascular    Leg swelling    Cardiac    Chest pain or chest pressure:    Shortness of breath upon exertion:    Short of breath when lying flat:    Irregular heart rhythm:    Constitutional    Fever or chills:     PHYSICAL EXAM:   Vitals:   02/17/20 1304  BP: 103/71  Pulse: (!) 104  Resp: 16  Temp: 97.9 F (36.6 C)  TempSrc: Temporal  SpO2: 97%  Weight: 178 lb (80.7 kg)  Height: 5\' 6"  (1.676 m)    GENERAL: The patient is a well-nourished female, in no acute distress. The vital signs are documented above. CARDIOVASCULAR: There is a regular rate and rhythm. PULMONARY: There is good air exchange bilaterally without wheezing or rales. VASCULAR: She has no significant leg swelling on the left.  She has no bruising.  DATA:   VENOUS DUPLEX: I have independently interpreted her venous duplex scan today.  On the left side there is no evidence of DVT.  The left great saphenous vein is successfully closed from 1.05 cm distal to the saphenofemoral junction to the knee.  The EHIT-3 has completely resolved.  MEDICAL ISSUES:   STATUS POST LASER ABLATION LEFT GREAT SAPHENOUS VEIN: The patient is doing well.  She completed her 1 month course of Xarelto for an EHIT-3.  She has no symptoms on the right side.  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  I will see her back as needed.  Deitra Mayo [facility] [phone]

## 2020-02-22 DIAGNOSIS — C50911 Malignant neoplasm of unspecified site of right female breast: Secondary | ICD-10-CM

## 2020-02-23 ENCOUNTER — Telehealth: Payer: Self-pay | Admitting: Family Medicine

## 2020-02-23 NOTE — Telephone Encounter (Signed)
Can you confirm appt type for patient's CPAP follow up tomorrow. Listed as virtual visit on schedule but that does not usually send Mychart link from what I understand. Make sure if patient wants virtual follow up that we have the appt type switched so she gets the link. TY!!!

## 2020-02-23 NOTE — Telephone Encounter (Signed)
I spoke with Vanessa Allen to confirm her Visit on 02/24/20. She does want a telephone visit, not Mychart.

## 2020-02-23 NOTE — Progress Notes (Addendum)
PATIENT: Vanessa Allen DOB: [birth year]  REASON FOR VISIT: follow up HISTORY FROM: patient  Virtual Visit via Telephone Note  I connected with Vanessa Allen on 02/29/20 at  8:00 AM EST by telephone and verified that I am speaking with the correct person using two identifiers.   I discussed the limitations, risks, security and privacy concerns of performing an evaluation and management service by telephone and the availability of in person appointments. I also discussed with the patient that there may be a patient responsible charge related to this service. The patient expressed understanding and agreed to proceed.   History of Present Illness:  02/29/20 Vanessa Allen is a 55 y.o. female here today for follow up recently diagnosed mild central sleep apnea and insomnia. Sleep study 07/25/2019 showed mild CSA with AHI on 5.6 and REM AHI of 16.6 concerning for non central apnea. Titration study 09/08/2019 showed apnea best managed with CPAP at [facility]. Auto pap ordered with pressure settings of 5-12. She was set up on 11/28. She reports using therapy most every night. Some nights are better than others.   She was advised to start amitriptyline 56m at bedtime and was referred to CBT for insomnia. She was not comfortable starting amitriptyline. She was seen by CBT but she did not feel that it helped. She feels that she sleep fairly well at night but then is sleepy during the day at work. She has tried and failed multiple sleep agents in the past including trazodone, melatonin, Belsomra, Ambien. She is followed closely by [facility] for BRCA 2 positive right sided DCIS s/p radiation finished in 11/2019 and now on tamoxifen. She had wrist surgery last year and wanted to wait to have robotic assisted BSO. Risks greater than benefit of hysterectomy. Surgery planned for March.    Compliance data not available for review today. She has NPolandmachine with no modem.    History  (copied from Vanessa Dohmeier's previous note)  HPI:  AAREANNA GENGLERis a 55y.o. female patient ,  revisit on 06-08-2019. The patient has been seen in a consultation in September 2019 a home sleep test had been obtained which was on an apnea link at the time, it showed only an oxygen desaturation nadir of 83% SPO2 50 minutes of desaturation time for the total night, and average heart rate of 70 bpm.  The device cannot tell me as the heart rate is regular or not.  There was no evidence of sleep apnea with an AHI of 0.7.  The patient had to however significant fatigue and felt that there were other organic causes to her insomnia.  Unfortunately an attended sleep study had first been denied, based on what she is telling me I would think that she does need to have an attended sleep study.  She has worked night shifts in the past which could contribute to a circadian rhythm disorder in her very first visit with me Vanessa Allen told me that she can just not sleep and that this started rather abruptly about 5-1/2 years prior to our visit now 18 months later this may have even progressed.  Her chief complaint remains insomnia and explained without any correlating medical events at the onset.  She has more fatigue after days at work, especially consecutive days. "The more tired I am the harder it is to go to sleep- I toss and turn for hours" still uses a sleep mask, bedroom is cool and quiet.  She has established a comfortable environment but can't sleep and if she goes to sleep she wakes right up again. Traveling nurse. ED nurse. Works in [place], but stated that time zone changes have not affected her.      First time seen here on 10-27-2017  in a referral from [facility] for cyclic insomnia, problems to initiate and maintain sleep. Chief complaint according to patient : " I cant sleep ' - this started 5 years or more ago-no medication, medical condition, trauma or surgery started this".    At the  pleasure of meeting Vanessa Allen on 01-2017 , who has had problems to initiate and maintain sleep for several years now without being aware of a trigger for this development.  She is not excessively daytime sleepy, on the contrary she could not nap when she wanted to.  She endorsed the Epworth Sleepiness Scale at 0 points, the fatigue severity however at 49 out of 63 points.  She has undergone a cholecystectomy, a myomectomy and has been diagnosed with diabetes and tendinitis, she frequently has sinus infections, and she is a shift Insurance underwriter.  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  Sleep habits are as follows: She does not have a set dinnertime, but usually cannot eat at work, as she works in the emergency room. This very busy workplace does not allow for regular breaks.  She commutes about 35 minutes from any pending hospital in [place] to [place].  Dinner not cannot be before 8 unless she grabs fast food, which happens a lot of times.  She will be home after 830, takes a shower, watching TV.  Sometimes she will wake up hourly sometimes she will not go to sleep at all.  She estimates that on average she is not asleep before 11 PM.  She keeps her bedroom cool, quiet and dark, if the bedroom is not dark enough she will use a sleep mask.  She does like to have the TV on in the background but uses a sleep mask to eliminate screen night.  She tosses and turns a lot, she cannot remember dreaming.  People around her have told her that she snores.  If she wakes up during the night she switches the TV off.  She does not report nocturia, headaches, dizziness or nausea.  No major discomfort. She sets her alarm for 5.30 AM and on a good night may have had 6 hours of sleep, but often less.  On non-work days her bedtime is not set- she may go to bed at 10- but often doesn't sleep.  Sleep medical history and family sleep history:  Youngest of 6 siblings, there were 3 sons and 3  daughters.  Older brother has OSA on CPAP. One older sister has insomnia. No history of ENT surgery, TBI or chronic pain conditions, depression, anxiety.     Social history:  Shift work in ED, single, no children. No pets.  caffeine- none, neither coffee, tea, soda. ETOH seldomly- 1 glass wine once a month. No tobacco use, no- vaping.     Observations/Objective:  Generalized: Well developed, in no acute distress  Mentation: Alert oriented to time, place, history taking. Follows all commands speech and language fluent   Assessment and Plan:  55 y.o. year old female  has a past medical history of BRCA2 gene mutation positive in female, Cancer [facility]), Complication of anesthesia, Diabetes mellitus without complication ([place]), Family history of BRCA  gene mutation, Family history of breast cancer, Family history of leukemia, Family history of ovarian cancer, Family history of stomach cancer, GERD (gastroesophageal reflux disease), PONV (postoperative nausea and vomiting), and Venous insufficiency of lower extremity. here with    ICD-10-CM   1. Central sleep apnea  G47.31   2. Sleep apnea with use of continuous positive airway pressure (CPAP)  G47.30      She is doing well, today. Compliance data not available for review today. I have contacted [facility] to assist with obtaining data. She was encouraged to use CPAP nightly and for at least 4 hours every night. She will continue close follow up with PCP and oncology. I suspect anxiety could be contributing to sleep concerns. Sleep hygiene reviewed. She will follow up pending review of data. She verbalizes understanding and agreement with this plan.    ADDENDUM 03/08/2020: Review of compliance report dated 12/16/2019-03/01/2020 reveals that she used CPAP 47 of the past 77 days for a compliance of 61%. She used > 4 hours 45/77 days for compliance of 58%. Average usage was 3hr and 64mn. Residual AHI was 0.2 on 5-12cmH20. No significant leak.   No orders  of the defined types were placed in this encounter.   No orders of the defined types were placed in this encounter.    Follow Up Instructions:  I discussed the assessment and treatment plan with the patient. The patient was provided an opportunity to ask questions and all were answered. The patient agreed with the plan and demonstrated an understanding of the instructions.   The patient was advised to call back or seek an in-person evaluation if the symptoms worsen or if the condition fails to improve as anticipated.  I provided 15 minutes of non-face-to-face time during this encounter. Patient at her place of residence, provider is in the office.    ADebbora Presto NP

## 2020-02-23 NOTE — Patient Instructions (Signed)
Please continue using your CPAP regularly. While your insurance requires that you use CPAP at least 4 hours each night on 70% of the nights, I recommend, that you not skip any nights and use it throughout the night if you can. Getting used to CPAP and staying with the treatment long term does take time and patience and discipline. Untreated obstructive sleep apnea when it is moderate to severe can have an adverse impact on cardiovascular health and raise her risk for heart disease, arrhythmias, hypertension, congestive heart failure, stroke and diabetes. Untreated obstructive sleep apnea causes sleep disruption, nonrestorative sleep, and sleep deprivation. This can have an impact on your day to day functioning and cause daytime sleepiness and impairment of cognitive function, memory loss, mood disturbance, and problems focussing. Using CPAP regularly can improve these symptoms.    Quality Sleep Information, Adult Quality sleep is important for your mental and physical health. It also improves your quality of life. Quality sleep means you:  Are asleep for most of the time you are in bed.  Fall asleep within 30 minutes.  Wake up no more than once a night.  Are awake for no longer than 20 minutes if you do wake up during the night. Most adults need 7-8 hours of quality sleep each night. How can poor sleep affect me? If you do not get enough quality sleep, you may have:  Mood swings.  Daytime sleepiness.  Confusion.  Decreased reaction time.  Sleep disorders, such as insomnia and sleep apnea.  Difficulty with: ? Solving problems. ? Coping with stress. ? Paying attention. These issues may affect your performance and productivity at work, school, and at home. Lack of sleep may also put you at higher risk for accidents, suicide, and risky behaviors. If you do not get quality sleep you may also be at higher risk for several health problems, including:  Infections.  Type 2  diabetes.  Heart disease.  High blood pressure.  Obesity.  Worsening of long-term conditions, like arthritis, kidney disease, depression, Parkinson's disease, and epilepsy. What actions can I take to get more quality sleep?  Stick to a sleep schedule. Go to sleep and wake up at about the same time each day. Do not try to sleep less on weekdays and make up for lost sleep on weekends. This does not work.  Try to get about 30 minutes of exercise on most days. Do not exercise 2-3 hours before going to bed.  Limit naps during the day to 30 minutes or less.  Do not use any products that contain nicotine or tobacco, such as cigarettes or e-cigarettes. If you need help quitting, ask your health care provider.  Do not drink caffeinated beverages for at least 8 hours before going to bed. Coffee, tea, and some sodas contain caffeine.  Do not drink alcohol close to bedtime.  Do not eat large meals close to bedtime.  Do not take naps in the late afternoon.  Try to get at least 30 minutes of sunlight every day. Morning sunlight is best.  Make time to relax before bed. Reading, listening to music, or taking a hot bath promotes quality sleep.  Make your bedroom a place that promotes quality sleep. Keep your bedroom dark, quiet, and at a comfortable room temperature. Make sure your bed is comfortable. Take out sleep distractions like TV, a computer, smartphone, and bright lights.  If you are lying awake in bed for longer than 20 minutes, get up and do a relaxing activity  until you feel sleepy.  Work with your health care provider to treat medical conditions that may affect sleeping, such as: ? Nasal obstruction. ? Snoring. ? Sleep apnea and other sleep disorders.  Talk to your health care provider if you think any of your prescription medicines may cause you to have difficulty falling or staying asleep.  If you have sleep problems, talk with a sleep consultant. If you think you have a sleep  disorder, talk with your health care provider about getting evaluated by a specialist.      Where to find more information  [place] website: https://sleepfoundation.org  National Heart, Lung, and [place] ([place]): http://www.saunders.info/.pdf  Centers for Disease Control and Prevention (CDC): LearningDermatology.pl Contact a health care provider if you:  Have trouble getting to sleep or staying asleep.  Often wake up very early in the morning and cannot get back to sleep.  Have daytime sleepiness.  Have daytime sleep attacks of suddenly falling asleep and sudden muscle weakness (narcolepsy).  Have a tingling sensation in your legs with a strong urge to move your legs (restless legs syndrome).  Stop breathing briefly during sleep (sleep apnea).  Think you have a sleep disorder or are taking a medicine that is affecting your quality of sleep. Summary  Most adults need 7-8 hours of quality sleep each night.  Getting enough quality sleep is an important part of health and well-being.  Make your bedroom a place that promotes quality sleep and avoid things that may cause you to have poor sleep, such as alcohol, caffeine, smoking, and large meals.  Talk to your health care provider if you have trouble falling asleep or staying asleep. This information is not intended to replace advice given to you by your health care provider. Make sure you discuss any questions you have with your health care provider. Document Revised: 04/23/2017 Document Reviewed: 04/23/2017 Elsevier Patient Education  2021 [place].   Sleep Apnea Sleep apnea affects breathing during sleep. It causes breathing to stop for a short time or to become shallow. It can also increase the risk of:  Heart attack.  Stroke.  Being very overweight (obese).  Diabetes.  Heart failure.  Irregular heartbeat. The goal of treatment is to help you breathe  normally again. What are the causes? There are three kinds of sleep apnea:  Obstructive sleep apnea. This is caused by a blocked or collapsed airway.  Central sleep apnea. This happens when the brain does not send the right signals to the muscles that control breathing.  Mixed sleep apnea. This is a combination of obstructive and central sleep apnea. The most common cause of this condition is a collapsed or blocked airway. This can happen if:  Your throat muscles are too relaxed.  Your tongue and tonsils are too large.  You are overweight.  Your airway is too small.   What increases the risk?  Being overweight.  Smoking.  Having a small airway.  Being older.  Being female.  Drinking alcohol.  Taking medicines to calm yourself (sedatives or tranquilizers).  Having family members with the condition. What are the signs or symptoms?  Trouble staying asleep.  Being sleepy or tired during the day.  Getting angry a lot.  Loud snoring.  Headaches in the morning.  Not being able to focus your mind (concentrate).  Forgetting things.  Less interest in sex.  Mood swings.  Personality changes.  Feelings of sadness (depression).  Waking up a lot during the night  to pee (urinate).  Dry mouth.  Sore throat. How is this diagnosed?  Your medical history.  A physical exam.  A test that is done when you are sleeping (sleep study). The test is most often done in a sleep lab but may also be done at home. How is this treated?  Sleeping on your side.  Using a medicine to get rid of mucus in your nose (decongestant).  Avoiding the use of alcohol, medicines to help you relax, or certain pain medicines (narcotics).  Losing weight, if needed.  Changing your diet.  Not smoking.  Using a machine to open your airway while you sleep, such as: ? An oral appliance. This is a mouthpiece that shifts your lower jaw forward. ? A CPAP device. This device blows air  through a mask when you breathe out (exhale). ? An EPAP device. This has valves that you put in each nostril. ? A BPAP device. This device blows air through a mask when you breathe in (inhale) and breathe out.  Having surgery if other treatments do not work. It is important to get treatment for sleep apnea. Without treatment, it can lead to:  High blood pressure.  Coronary artery disease.  In men, not being able to have an erection (impotence).  Reduced thinking ability.   Follow these instructions at home: Lifestyle  Make changes that your doctor recommends.  Eat a healthy diet.  Lose weight if needed.  Avoid alcohol, medicines to help you relax, and some pain medicines.  Do not use any products that contain nicotine or tobacco, such as cigarettes, e-cigarettes, and chewing tobacco. If you need help quitting, ask your doctor. General instructions  Take over-the-counter and prescription medicines only as told by your doctor.  If you were given a machine to use while you sleep, use it only as told by your doctor.  If you are having surgery, make sure to tell your doctor you have sleep apnea. You may need to bring your device with you.  Keep all follow-up visits as told by your doctor. This is important. Contact a doctor if:  The machine that you were given to use during sleep bothers you or does not seem to be working.  You do not get better.  You get worse. Get help right away if:  Your chest hurts.  You have trouble breathing in enough air.  You have an uncomfortable feeling in your back, arms, or stomach.  You have trouble talking.  One side of your body feels weak.  A part of your face is hanging down. These symptoms may be an emergency. Do not wait to see if the symptoms will go away. Get medical help right away. Call your local emergency services (911 in the [place] Do not drive yourself to the hospital. Summary  This condition affects breathing during  sleep.  The most common cause is a collapsed or blocked airway.  The goal of treatment is to help you breathe normally while you sleep. This information is not intended to replace advice given to you by your health care provider. Make sure you discuss any questions you have with your health care provider. Document Revised: 10/31/2017 Document Reviewed: 09/09/2017 Elsevier Patient Education  [place].

## 2020-02-24 ENCOUNTER — Telehealth (INDEPENDENT_AMBULATORY_CARE_PROVIDER_SITE_OTHER): Payer: BC Managed Care – PPO | Admitting: Family Medicine

## 2020-02-24 DIAGNOSIS — G4731 Primary central sleep apnea: Secondary | ICD-10-CM

## 2020-02-24 DIAGNOSIS — G4730 Sleep apnea, unspecified: Secondary | ICD-10-CM

## 2020-02-24 NOTE — Telephone Encounter (Signed)
I spoke with patient again yesterday evening 02/23/20 and she was willing to change her visit from telephone to a Mychart visit.

## 2020-02-28 ENCOUNTER — Encounter: Payer: Self-pay | Admitting: Nurse Practitioner

## 2020-02-28 ENCOUNTER — Other Ambulatory Visit: Payer: Self-pay

## 2020-02-28 ENCOUNTER — Ambulatory Visit (INDEPENDENT_AMBULATORY_CARE_PROVIDER_SITE_OTHER): Payer: BC Managed Care – PPO | Admitting: Nurse Practitioner

## 2020-02-28 VITALS — BP 120/76 | HR 101 | Temp 99.0°F | Ht 66.0 in | Wt 178.6 lb

## 2020-02-28 DIAGNOSIS — I739 Peripheral vascular disease, unspecified: Secondary | ICD-10-CM

## 2020-02-28 DIAGNOSIS — K219 Gastro-esophageal reflux disease without esophagitis: Secondary | ICD-10-CM

## 2020-02-28 DIAGNOSIS — C50911 Malignant neoplasm of unspecified site of right female breast: Secondary | ICD-10-CM | POA: Diagnosis not present

## 2020-02-28 DIAGNOSIS — E119 Type 2 diabetes mellitus without complications: Secondary | ICD-10-CM

## 2020-02-28 DIAGNOSIS — Z86718 Personal history of other venous thrombosis and embolism: Secondary | ICD-10-CM

## 2020-02-28 DIAGNOSIS — Z1501 Genetic susceptibility to malignant neoplasm of breast: Secondary | ICD-10-CM | POA: Diagnosis not present

## 2020-02-28 LAB — BMP8+EGFR
BUN/Creatinine Ratio: 15 (ref 9–23)
BUN: 15 mg/dL (ref 6–24)
CO2: 22 mmol/L (ref 20–29)
Calcium: 9.7 mg/dL (ref 8.7–10.2)
Chloride: 104 mmol/L (ref 96–106)
Creatinine, Ser: 1 mg/dL (ref 0.57–1.00)
GFR calc Af Amer: 75 mL/min/{1.73_m2} (ref >59)
GFR calc non Af Amer: 65 mL/min/{1.73_m2} (ref >59)
Glucose: 115 mg/dL — ABNORMAL HIGH (ref 65–99)
Potassium: 4.4 mmol/L (ref 3.5–5.2)
Sodium: 142 mmol/L (ref 134–144)

## 2020-02-28 LAB — CBC WITH DIFFERENTIAL/PLATELET
Basophils Absolute: 0 10*3/uL (ref 0.0–0.2)
Basos: 1 %
EOS (ABSOLUTE): 0.1 10*3/uL (ref 0.0–0.4)
Eos: 2 %
Hematocrit: 39.7 % (ref 34.0–46.6)
Hemoglobin: 13.6 g/dL (ref 11.1–15.9)
Immature Grans (Abs): 0 10*3/uL (ref 0.0–0.1)
Immature Granulocytes: 0 %
Lymphocytes Absolute: 1.2 10*3/uL (ref 0.7–3.1)
Lymphs: 33 %
MCH: 28.1 pg (ref 26.6–33.0)
MCHC: 34.3 g/dL (ref 31.5–35.7)
MCV: 82 fL (ref 79–97)
Monocytes Absolute: 0.2 10*3/uL (ref 0.1–0.9)
Monocytes: 6 %
Neutrophils Absolute: 2.1 10*3/uL (ref 1.4–7.0)
Neutrophils: 58 %
Platelets: 250 10*3/uL (ref 150–450)
RBC: 4.84 x10E6/uL (ref 3.77–5.28)
RDW: 13.1 % (ref 11.7–15.4)
WBC: 3.6 10*3/uL (ref 3.4–10.8)

## 2020-02-28 LAB — HEMOGLOBIN A1C
Est. average glucose Bld gHb Est-mCnc: 137 mg/dL
Hgb A1c MFr Bld: 6.4 % — ABNORMAL HIGH (ref 4.8–5.6)

## 2020-02-28 NOTE — Progress Notes (Signed)
I,Yamilka [employer] as a Education administrator for [employer], FNP.,have documented all relevant documentation on the behalf of [name], FNP,as directed by  [name], FNP while in the presence of [name], [place]. This visit occurred during the SARS-CoV-2 public health emergency.  Safety protocols were in place, including screening questions prior to the visit, additional usage of staff PPE, and extensive cleaning of exam room while observing appropriate contact time as indicated for disinfecting solutions.  Subjective:     Patient ID: Vanessa Allen , female    DOB: 05-31-1965 , 55 y.o.   MRN: [id number]   Chief Complaint  Patient presents with  . Prediabetes    HPI  Patient presents today for a diabetes f/u. She continues to have post radiation fatigue. 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  She has been out of work since June 2021.  She is planning to have partial hysterectomy in March due to having BRCA2 gene.  She had a colonoscopy 2 years ago and is interested to have a repeat colonoscopy early due to history of breast cancer and her sister had a colonoscopy before her 10 years and has precancerous cells.   She reports her insurance sent a letter indicating they will not cover her nexium.    She was on Xarelto for 6 weeks after the ablation of her left lower extremity and developed a DVT.    Wt Readings from Last 3 Encounters: 02/28/20 : 178 lb 9.6 oz (81 kg) 02/17/20 : 178 lb (80.7 kg) 01/13/20 : 180 lb (81.6 kg)   Diabetes She presents for her follow-up diabetic visit. Diabetes type: prediabetes. There are no hypoglycemic associated symptoms. There are no diabetic associated symptoms. Pertinent negatives for diabetes include no chest pain, no polydipsia, no polyphagia and no polyuria. There are no hypoglycemic complications. Symptoms are stable. There are no diabetic complications. Risk factors for coronary artery disease include obesity and  sedentary lifestyle. Current diabetic treatment includes oral agent (monotherapy). She is compliant with treatment all of the time. She is following a generally healthy diet. When asked about meal planning, she reported none. She has not had a previous visit with a dietitian. There is no change in her home blood glucose trend. (Her blood sugar has overall been doing okay. ) An ACE inhibitor/angiotensin II receptor blocker is not being taken.  Insomnia Primary symptoms: no sleep disturbance.  The onset quality is gradual. The problem occurs nightly. Past treatments include medication (belsomra - has not seen significant difference but not taking consistently). The treatment provided no relief. Prior workup: she had a home sleep study was not approved for sleep study in person.     Past Medical History:  Diagnosis Date  . BRCA2 gene mutation positive in female   . Cancer [facility])    Right Breast Cancer  . Complication of anesthesia   . Diabetes mellitus without complication ([place])   . Family history of BRCA gene mutation   . Family history of breast cancer   . Family history of leukemia   . Family history of ovarian cancer   . Family history of stomach cancer   . GERD (gastroesophageal reflux disease)   . PONV (postoperative nausea and vomiting)   . Venous insufficiency of lower extremity      Family History  Problem Relation Age of Onset  . Hyperlipidemia Mother   . Hypertension Mother   . Varicose Veins Mother   . Diabetes  Father   . Heart disease Father   . Stomach cancer Father 39  . Hypertension Brother   . Leukemia Paternal Aunt        dx. >50  . Breast cancer Niece 39  . BRCA 1/2 Niece   . Ovarian cancer Cousin        d. <50 (maternal first cousin)  . Ovarian cancer Cousin        d. <50 (maternal first cousin)  . Ovarian cancer Cousin        d. late 38s (maternal first cousin)  . Colon cancer Neg Hx   . Pancreatic cancer Neg Hx   . Prostate cancer Neg Hx      Current  Outpatient Medications:  .  ACCU-CHEK GUIDE test strip, USE TWICE DAILY TO CHECK BLOOD SUGAR BEFORE BREAKFAST AND DINNER, Disp: 100 strip, Rfl: 5 .  acidophilus (RISAQUAD) CAPS capsule, Take 1 capsule by mouth daily., Disp: , Rfl:  .  ALPRAZolam (XANAX) 0.5 MG tablet, Take 1 tablet (0.5 mg total) by mouth 3 (three) times daily as needed for anxiety., Disp: 30 tablet, Rfl: 1 .  aspirin EC 81 MG tablet, Take 81 mg by mouth daily. Swallow whole., Disp: , Rfl:  .  atorvastatin (LIPITOR) 10 MG tablet, TAKE 1 TABLET(10 MG) BY MOUTH DAILY (Patient taking differently: Take 10 mg by mouth every 7 (seven) days.), Disp: 90 tablet, Rfl: 1 .  BELSOMRA 10 MG TABS, TAKE 1 TABLET BY MOUTH EVERY NIGHT AT BEDTIME AS NEEDED, Disp: 30 tablet, Rfl: 2 .  Biotin w/ Vitamins C & E (HAIR/SKIN/NAILS PO), Take 1 tablet by mouth daily., Disp: , Rfl:  .  Coenzyme Q10 (CO Q-10) 100 MG CAPS, Take by mouth., Disp: , Rfl:  .  Continuous Blood Gluc Receiver (FREESTYLE LIBRE 14 DAY READER) DEVI, USE TO CHECK BLOOD SUGAR BEFORE MEALS AND AT BEDTIME, Disp: 1 each, Rfl: 2 .  Continuous Blood Gluc Sensor (FREESTYLE LIBRE 14 DAY SENSOR) MISC, USE TO MEASURE BLOOD GLUCOSE FOUR TIMES DAILY BEFORE MEALS AND AT BEDTIME, Disp: 2 each, Rfl: 11 .  cyclobenzaprine (FLEXERIL) 10 MG tablet, Take 10 mg by mouth at bedtime as needed for muscle spasms. , Disp: , Rfl:  .  esomeprazole (NEXIUM) 20 MG capsule, Take 1 capsule (20 mg total) by mouth daily., Disp: 90 capsule, Rfl: 1 .  Glucose Blood (GLUCOSE METER TEST VI), by In Vitro route 2 (two) times daily., Disp: , Rfl:  .  ibuprofen (ADVIL) 800 MG tablet, Take 1 tablet (800 mg total) by mouth every 8 (eight) hours as needed for moderate pain. For AFTER surgery, Disp: 30 tablet, Rfl: 0 .  meloxicam (MOBIC) 15 MG tablet, Take 15 mg by mouth daily as needed for pain. , Disp: , Rfl:  .  metFORMIN (GLUCOPHAGE) 500 MG tablet, Take 1 tablet (500 mg total) by mouth daily with breakfast. TAKE 1 TABLET BY MOUTH  EVERY DAY WITH BREAKFAST, Disp: 90 tablet, Rfl: 1 .  Multiple Vitamin (MULTIVITAMIN WITH MINERALS) TABS tablet, Take 1 tablet by mouth daily., Disp: , Rfl:  .  Omega-3 1000 MG CAPS, Take 1,000 mg by mouth daily., Disp: , Rfl:  .  Semaglutide (RYBELSUS) 14 MG TABS, Take 1 tablet by mouth daily. Take 30 minutes before breakfast, Disp: 90 tablet, Rfl: 0 .  senna-docusate (SENOKOT-S) 8.6-50 MG tablet, Take 2 tablets by mouth at bedtime. For AFTER surgery, do not take if having diarrhea, Disp: 30 tablet, Rfl: 0 .  tamoxifen (NOLVADEX)  20 MG tablet, Take 20 mg by mouth daily., Disp: , Rfl:  .  Turmeric (QC TUMERIC COMPLEX PO), Take 1,000 mg by mouth daily., Disp: , Rfl:    No Known Allergies   Review of Systems  Constitutional: Negative.   HENT: Negative.   Eyes: Negative.   Respiratory: Negative.   Cardiovascular: Negative.  Negative for chest pain, palpitations and leg swelling.  Gastrointestinal: Negative.   Endocrine: Negative for polydipsia, polyphagia and polyuria.  Genitourinary: Negative.   Musculoskeletal: Negative.   Skin: Negative.   Neurological: Negative.   Hematological: Negative.   Psychiatric/Behavioral: Negative for sleep disturbance. The patient has insomnia.      Today's Vitals   02/28/20 0842  BP: 120/76  Pulse: (!) 101  Temp: 99 F (37.2 C)  TempSrc: Oral  Weight: 178 lb 9.6 oz (81 kg)  Height: '5\' 6"'  (1.676 m)  PainSc: 0-No pain   Body mass index is 28.83 kg/m.   Objective:  Physical Exam Vitals reviewed.  Constitutional:      General: She is not in acute distress.    Appearance: Normal appearance.  Cardiovascular:     Rate and Rhythm: Normal rate and regular rhythm.     Pulses: Normal pulses.     Heart sounds: Normal heart sounds. No murmur heard.   Pulmonary:     Effort: Pulmonary effort is normal. No respiratory distress.     Breath sounds: Normal breath sounds.  Skin:    Capillary Refill: Capillary refill takes less than 2 seconds.   Neurological:     General: No focal deficit present.     Mental Status: She is alert and oriented to person, place, and time.     Cranial Nerves: No cranial nerve deficit.     Motor: No weakness.  Psychiatric:        Mood and Affect: Mood normal.        Behavior: Behavior normal.        Thought Content: Thought content normal.        Judgment: Judgment normal.         Assessment And Plan:     1. Type 2 diabetes mellitus without complication, without long-term current use of insulin (HCC)  Chronic, stable  Will see if her insurance covers rybelsus - Hemoglobin A1c - BMP8+eGFR  2. Gastroesophageal reflux disease without esophagitis Chronic, stable Continue to avoid high fat foods  3. PVD (peripheral vascular disease) ([place]) Being followed by vascular and her wound to her left leg looks much better  4. Breast cancer, BRCA2 positive, right ([place])  Being followed by oncology  She is now on oral chemo - CBC with Differential/Platelet   After her procedure ablation to her leg she developed a blood closure of saphenous vein and took xarelto x one month    Patient was given opportunity to ask questions. Patient verbalized understanding of the plan and was able to repeat key elements of the plan. All questions were answered to their satisfaction.  [name], FNP   I, [name], FNP, have reviewed all documentation for this visit. The documentation on 02/28/20 for the exam, diagnosis, procedures, and orders are all accurate and complete .  THE PATIENT IS ENCOURAGED TO PRACTICE SOCIAL DISTANCING DUE TO THE COVID-19 PANDEMIC.

## 2020-02-29 ENCOUNTER — Encounter: Payer: Self-pay | Admitting: Family Medicine

## 2020-03-01 ENCOUNTER — Telehealth: Payer: Self-pay | Admitting: *Deleted

## 2020-03-01 NOTE — Telephone Encounter (Signed)
Received, printed cpap DL from [facility].  To AL/NP desk.

## 2020-03-01 NOTE — Telephone Encounter (Signed)
Called pt re: trying to receive CPAP DL for pt from [facility] on her new LUNA machine.  She stated she did got to there office, Heather RT was helping her and was able to get the information Cpap DL.  Not on [facility].  I called and spoke to [facility], and she will email to me the cpap DL.  Will give to AL/NP when received.

## 2020-03-08 ENCOUNTER — Other Ambulatory Visit: Payer: Self-pay

## 2020-03-08 ENCOUNTER — Encounter: Payer: Self-pay | Admitting: Nurse Practitioner

## 2020-03-08 ENCOUNTER — Telehealth: Payer: Self-pay | Admitting: Family Medicine

## 2020-03-08 MED ORDER — RYBELSUS 14 MG PO TABS
1.0000 | ORAL_TABLET | Freq: Every day | ORAL | 0 refills | Status: DC
Start: 2020-03-08 — End: 2020-06-12

## 2020-03-08 NOTE — Telephone Encounter (Signed)
I sent message in mychart with cpap DL results.  6 month f/u 08-03-20 at [place] for her that sent message on mychart and 6 month appt.  She is to call if questions.

## 2020-03-08 NOTE — Telephone Encounter (Signed)
Please let her know that I have reviewed her compliance data. She has used CPAP 61% form a daily perspective and 58% from 4 hour usage. Remind her that I would like her to use every night and greater than 4 hours every night. Her apneic events are very well managed at current settings (0.2 events on average). We should schedule follow up in about 6 months to reassess. TY!

## 2020-03-13 ENCOUNTER — Other Ambulatory Visit: Payer: Self-pay

## 2020-03-13 ENCOUNTER — Ambulatory Visit: Payer: BC Managed Care – PPO | Attending: Radiation Oncology

## 2020-03-13 DIAGNOSIS — Z483 Aftercare following surgery for neoplasm: Secondary | ICD-10-CM

## 2020-03-13 NOTE — Therapy (Signed)
[place], [place], [address] Phone: [phone]   Fax:  [phone]  Physical Therapy Treatment  Patient Details  Name: Vanessa Allen MRN: [id number] Date of Birth: [birth year] Referring Provider (PT): Dr. Erroll Luna   Encounter Date: 03/13/2020   PT End of Session - 03/13/20 1630    Visit Number 2   # unchanged due to screen only   Number of Visits 2    Date for PT Re-Evaluation 11/01/19    PT Start Time 1623    PT Stop Time 1630    PT Time Calculation (min) 7 min    Activity Tolerance Patient tolerated treatment well    Behavior During Therapy [facility] for tasks assessed/performed           Past Medical History:  Diagnosis Date  . BRCA2 gene mutation positive in female   . Cancer [facility])    Right Breast Cancer  . Complication of anesthesia   . Diabetes mellitus without complication ([place])   . Family history of BRCA gene mutation   . Family history of breast cancer   . Family history of leukemia   . Family history of ovarian cancer   . Family history of stomach cancer   . GERD (gastroesophageal reflux disease)   . PONV (postoperative nausea and vomiting)   . Venous insufficiency of lower extremity     Past Surgical History:  Procedure Laterality Date  . APPENDECTOMY    . BREAST LUMPECTOMY WITH RADIOACTIVE SEED AND SENTINEL LYMPH NODE BIOPSY Right 09/23/2019   Procedure: RIGHT BREAST LUMPECTOMY WITH RADIOACTIVE SEED AND SENTINEL LYMPH NODE MAPPING;  Surgeon: Erroll Luna, MD;  Location: [place];  Service: General;  Laterality: Right;  PEC BLOCK  . CHOLECYSTECTOMY     lsc chole cystectomy and appendectomy 1 month after myomectomy  . CYST EXCISION Left 12/13/2019   Procedure: Excision left dorsal wrist ganglion cyst;  Surgeon: Cindra Presume, MD;  Location: [place];  Service: Plastics;  Laterality: Left;  45 min  . ENDOVENOUS ABLATION SAPHENOUS VEIN W/ LASER Left  12/30/2019   endovenous laser ablation left greater saphenous vein by Gae Gallop MD   . Lipoma removal Left    Left Lower Back  . MYOMECTOMY    . WISDOM TOOTH EXTRACTION      There were no vitals filed for this visit.   Subjective Assessment - 03/13/20 1627    Subjective Pt returns for 3 month L-Dex screen.    Pertinent History Patient was diagnosed on 07/23/2019 with right intermediate grade DCIS with concerns for possible invasive disease. It measures 1.9 cm and is located in the upper inner quadrant. It is ER/PR positive. On 09/23/2019, she had a right lumpectomy with 3 nodes removed.  Radiatation started 9/27 and is schedled til 11/9 she will have no chemo                  L-DEX FLOWSHEETS - 03/13/20 1600      L-DEX LYMPHEDEMA SCREENING   Measurement Type Unilateral    L-DEX MEASUREMENT EXTREMITY Upper Extremity    POSITION  Standing    DOMINANT SIDE Left    At Risk Side Right    BASELINE SCORE (UNILATERAL) 3.3    L-DEX SCORE (UNILATERAL) 6.4    VALUE CHANGE (UNILAT) 3.1  PT Long Term Goals - 11/01/19 1236      PT LONG TERM GOAL #1   Title Patient will demonstrate she has regained full shoulder ROM and function post operatively compared to baselines.    Baseline pt reports she has mild pain in right shoudler    Time 8    Period Weeks    Status Partially Met                 Plan - 03/13/20 1630    Clinical Impression Statement Pt returns for her 3 month L-dex screen. Her change from baseline of 3.1 is WNLs so no further treatment is required at this time except to cont every 3 month L-Dex screens which pt is agreeable to.    PT Next Visit Plan Cont every 3 month L-Dex screens for up to 2 years from her SLNB.    Consulted and Agree with Plan of Care Patient           Patient will benefit from skilled therapeutic intervention in order to improve the following deficits and impairments:      Visit Diagnosis: Aftercare following surgery for neoplasm     Problem List Patient Active Problem List   Diagnosis Date Noted  . PVD (peripheral vascular disease) ([place]) 02/28/2020  . Breast cancer, BRCA2 positive, right ([place]) 12/08/2019  . Central sleep apnea 09/20/2019  . Genetic testing 09/10/2019  . BRCA2 gene mutation positive in female 09/10/2019  . Family history of BRCA gene mutation   . Family history of breast cancer   . Family history of ovarian cancer   . Family history of stomach cancer   . Family history of leukemia   . Ductal carcinoma in situ (DCIS) of right breast 08/31/2019  . Chronic insomnia 06/08/2019  . Atypical chest pain 07/20/2018  . Chest discomfort 05/21/2018  . Anxiety 05/21/2018  . Snoring 10/27/2017  . Episodic circadian rhythm sleep disorder, shift work type 10/27/2017  . Insomnia 10/27/2017  . Type 2 diabetes mellitus without complication, without long-term current use of insulin ([place]) 10/27/2017    Otelia Limes, PTA 03/13/2020, 4:33 PM  [place], [place], [address] Phone: [phone]   Fax:  [phone]  Name: TANVI GATLING MRN: [id number] Date of Birth: [birth year]

## 2020-03-13 NOTE — Progress Notes (Signed)
[place]  [phone] [phone]     ID: Vanessa Allen DOB: 03/13/1965  MR#: [id number]  SFK#:[id number]  Patient Care Team: [name], [place] as PCP - General (General Practice) Erroll Luna, MD as Consulting Physician (General Surgery) Magrinat, Virgie Dad, MD as Consulting Physician (Oncology) Kyung Rudd, MD as Consulting Physician (Radiation Oncology) Mauro Kaufmann, RN as Oncology Nurse Navigator Rockwell Germany, RN as Oncology Nurse Navigator Lafonda Mosses, MD as Consulting Physician (Gynecologic Oncology) Chauncey Cruel, MD OTHER MD:  CHIEF COMPLAINT: Ductal carcinoma in situ; BRCA2+  CURRENT TREATMENT: tamoxifen   INTERVAL HISTORY: Vanessa Allen returns today for follow up of her noninvasive breast cancer.  She was started on tamoxifen at her last visit on 12/08/2019.  She is generally tolerating this well.  She does have hot flashes but she had those before.  She has had mild vaginal wetness which is new.  She is scheduled for annual mammography tomorrow, 03/15/2020.  She is also scheduled for salpingo-oophorectomy on 04/25/2020 under Dr. Berline Lopes.   REVIEW OF SYSTEMS: Vanessa Allen is working very long hours in a very stressful environment.  This is a concern as discussed below.  She is moderately fatigued.  She tells me she walks more than 10,000 steps a day at work.  That is very favorable.  She has had no intercurrent infection.  A detailed review of systems was otherwise stable   COVID 19 VACCINATION STATUS: [place] x2, status post booster   HISTORY OF CURRENT ILLNESS: From the original intake note:  Vanessa Allen had routine screening mammography on 07/23/2019 showing a possible abnormality in the right breast. She underwent right diagnostic mammography with tomography at [facility] on 08/19/2019 showing: breast density category C; indeterminate 1.9 cm medial right breast calcifications.  Accordingly on  08/25/2019 she proceeded to biopsy of the right breast area in question. The pathology from this procedure ([id number]) showed: ductal carcinoma involving a papillary lesion. There is at least intermediate grade ductal carcinoma in situ, however, the lesion is fragmented hampering assessment and invasion can not be excluded. Lymph node sampling at the time of excision is recommended. Prognostic indicators significant for: estrogen receptor, 90% positive and progesterone receptor, 90% positive, both with strong staining intensity.  Right axilla ultrasound was performed on 08/31/2019 showing no abnormal-appearing lymph nodes.   The patient's subsequent history is as detailed below.   PAST MEDICAL HISTORY: Past Medical History:  Diagnosis Date  . BRCA2 gene mutation positive in female   . Cancer [facility])    Right Breast Cancer  . Complication of anesthesia   . Diabetes mellitus without complication ([place])   . Family history of BRCA gene mutation   . Family history of breast cancer   . Family history of leukemia   . Family history of ovarian cancer   . Family history of stomach cancer   . GERD (gastroesophageal reflux disease)   . PONV (postoperative nausea and vomiting)   . Venous insufficiency of lower extremity     PAST SURGICAL HISTORY: Past Surgical History:  Procedure Laterality Date  . APPENDECTOMY    . BREAST LUMPECTOMY WITH RADIOACTIVE SEED AND SENTINEL LYMPH NODE BIOPSY Right 09/23/2019   Procedure: RIGHT BREAST LUMPECTOMY WITH RADIOACTIVE SEED AND SENTINEL LYMPH NODE MAPPING;  Surgeon: Erroll Luna, MD;  Location: [place];  Service: General;  Laterality: Right;  PEC BLOCK  . CHOLECYSTECTOMY     lsc chole cystectomy and appendectomy 1 month after myomectomy  .  CYST EXCISION Left 12/13/2019   Procedure: Excision left dorsal wrist ganglion cyst;  Surgeon: Cindra Presume, MD;  Location: [place];  Service: Plastics;  Laterality: Left;  45 min  . ENDOVENOUS ABLATION  SAPHENOUS VEIN W/ LASER Left 12/30/2019   endovenous laser ablation left greater saphenous vein by Gae Gallop MD   . Lipoma removal Left    Left Lower Back  . MYOMECTOMY    . WISDOM TOOTH EXTRACTION      FAMILY HISTORY: Family History  Problem Relation Age of Onset  . Hyperlipidemia Mother   . Hypertension Mother   . Varicose Veins Mother   . Diabetes Father   . Heart disease Father   . Stomach cancer Father 35  . Hypertension Brother   . Leukemia Paternal Aunt        dx. >50  . Breast cancer Niece 35  . BRCA 1/2 Niece   . Ovarian cancer Cousin        d. <50 (maternal first cousin)  . Ovarian cancer Cousin        d. <50 (maternal first cousin)  . Ovarian cancer Cousin        d. late 34s (maternal first cousin)  . Colon cancer Neg Hx   . Pancreatic cancer Neg Hx   . Prostate cancer Neg Hx    Her father died at age 82 after being diagnosed with stomach cancer. Her mother is age 85 (as of 08/2019). Vanessa Allen has 3 brothers and 2 sisters. She reports ovarian cancer in two maternal aunts and breast cancer in a niece (brother's daughter) at age 45.   GYNECOLOGIC HISTORY:  No LMP recorded. Patient is postmenopausal. Menarche: 55 years old GX P 0 LMP age [age] (early menopause) Contraceptive never used HRT used for <1 year  Hysterectomy? No, only myomectomy BSO? 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 She is single. She lives at home by herelf.    ADVANCED DIRECTIVES: not in place. She intends to name one of her sisters, Clent Demark or Insurance account manager as healthcare power of attorney.   HEALTH MAINTENANCE: Social History   Tobacco Use  . Smoking status: Never Smoker  . Smokeless tobacco: Never Used  Vaping Use  . Vaping Use: Never used  Substance Use Topics  . Alcohol use: Yes    Alcohol/week: 0.0 standard drinks    Comment: occasional  . Drug use: No     Colonoscopy: 11/2017, repeat  due 2029  PAP: 06/2018, negative  Bone density: never done   No Known Allergies  Current Outpatient Medications  Medication Sig Dispense Refill  . ACCU-CHEK GUIDE test strip USE TWICE DAILY TO CHECK BLOOD SUGAR BEFORE BREAKFAST AND DINNER 100 strip 5  . acidophilus (RISAQUAD) CAPS capsule Take 1 capsule by mouth daily.    Marland Kitchen ALPRAZolam (XANAX) 0.5 MG tablet Take 1 tablet (0.5 mg total) by mouth 3 (three) times daily as needed for anxiety. 30 tablet 1  . aspirin EC 81 MG tablet Take 81 mg by mouth daily. Swallow whole.    Marland Kitchen atorvastatin (LIPITOR) 10 MG tablet TAKE 1 TABLET(10 MG) BY MOUTH DAILY (Patient taking differently: Take 10 mg by mouth every 7 (seven) days.) 90 tablet 1  . BELSOMRA 10 MG TABS TAKE 1 TABLET BY MOUTH EVERY NIGHT AT BEDTIME AS NEEDED 30 tablet 2  . Biotin w/ Vitamins C & E (HAIR/SKIN/NAILS PO) Take 1  tablet by mouth daily.    . Coenzyme Q10 (CO Q-10) 100 MG CAPS Take by mouth.    . Continuous Blood Gluc Receiver (FREESTYLE LIBRE 14 DAY READER) DEVI USE TO CHECK BLOOD SUGAR BEFORE MEALS AND AT BEDTIME 1 each 2  . Continuous Blood Gluc Sensor (FREESTYLE LIBRE 14 DAY SENSOR) MISC USE TO MEASURE BLOOD GLUCOSE FOUR TIMES DAILY BEFORE MEALS AND AT BEDTIME 2 each 11  . cyclobenzaprine (FLEXERIL) 10 MG tablet Take 10 mg by mouth at bedtime as needed for muscle spasms.     Marland Kitchen esomeprazole (NEXIUM) 20 MG capsule Take 1 capsule (20 mg total) by mouth daily. 90 capsule 1  . Glucose Blood (GLUCOSE METER TEST VI) by In Vitro route 2 (two) times daily.    Marland Kitchen ibuprofen (ADVIL) 800 MG tablet Take 1 tablet (800 mg total) by mouth every 8 (eight) hours as needed for moderate pain. For AFTER surgery 30 tablet 0  . meloxicam (MOBIC) 15 MG tablet Take 15 mg by mouth daily as needed for pain.     . metFORMIN (GLUCOPHAGE) 500 MG tablet Take 1 tablet (500 mg total) by mouth daily with breakfast. TAKE 1 TABLET BY MOUTH EVERY DAY WITH BREAKFAST 90 tablet 1  . Multiple Vitamin (MULTIVITAMIN WITH  MINERALS) TABS tablet Take 1 tablet by mouth daily.    . Omega-3 1000 MG CAPS Take 1,000 mg by mouth daily.    . Semaglutide (RYBELSUS) 14 MG TABS Take 1 tablet by mouth daily. Take 30 minutes before breakfast 90 tablet 0  . senna-docusate (SENOKOT-S) 8.6-50 MG tablet Take 2 tablets by mouth at bedtime. For AFTER surgery, do not take if having diarrhea 30 tablet 0  . tamoxifen (NOLVADEX) 20 MG tablet Take 20 mg by mouth daily.    . Turmeric (QC TUMERIC COMPLEX PO) Take 1,000 mg by mouth daily.     No current facility-administered medications for this visit.    OBJECTIVE: African-American Allen who appears younger than stated age  42:   03/14/20 1303  BP: 116/84  Pulse: (!) 107  Resp: 18  Temp: 97.7 F (36.5 C)  SpO2: 100%     Body mass index is 28.97 kg/m.   Wt Readings from Last 3 Encounters:  03/14/20 179 lb 8 oz (81.4 kg)  02/28/20 178 lb 9.6 oz (81 kg)  02/17/20 178 lb (80.7 kg)      ECOG FS:2 - Symptomatic, <50% confined to bed  Sclerae unicteric, EOMs intact Wearing a mask No cervical or supraclavicular adenopathy Lungs no rales or rhonchi Heart regular rate and rhythm Abd soft, nontender, positive bowel sounds MSK no focal spinal tenderness, no upper extremity lymphedema Neuro: nonfocal, well oriented, appropriate affect Breasts: The right breast has undergone lumpectomy and radiation.  There is no evidence of local recurrence.  The left breast is benign.  Both axillae are benign.   LAB RESULTS:  CMP     Component Value Date/Time   NA 142 02/28/2020 1049   K 4.4 02/28/2020 1049   CL 104 02/28/2020 1049   CO2 22 02/28/2020 1049   GLUCOSE 115 (H) 02/28/2020 1049   GLUCOSE 122 (H) 12/10/2019 0900   BUN 15 02/28/2020 1049   CREATININE 1.00 02/28/2020 1049   CREATININE 0.88 09/01/2019 1210   CALCIUM 9.7 02/28/2020 1049   PROT 7.0 09/16/2019 1122   PROT 7.2 07/26/2019 0931   ALBUMIN 4.1 09/16/2019 1122   ALBUMIN 4.5 07/26/2019 0931   AST 30 09/16/2019  1122  AST 19 09/01/2019 1210   ALT 33 09/16/2019 1122   ALT 28 09/01/2019 1210   ALKPHOS 69 09/16/2019 1122   BILITOT 0.7 09/16/2019 1122   BILITOT 0.8 09/01/2019 1210   GFRNONAA 65 02/28/2020 1049   GFRNONAA >60 12/10/2019 0900   GFRNONAA >60 09/01/2019 1210   GFRAA 75 02/28/2020 1049   GFRAA >60 09/01/2019 1210    No results found for: TOTALPROTELP, ALBUMINELP, A1GS, A2GS, BETS, BETA2SER, GAMS, MSPIKE, SPEI  Lab Results  Component Value Date   WBC 3.2 (L) 03/14/2020   NEUTROABS 1.5 (L) 03/14/2020   HGB 13.0 03/14/2020   HCT 38.5 03/14/2020   MCV 82.6 03/14/2020   PLT 234 03/14/2020    No results found for: LABCA2  No components found for: HKVQQV956  No results for input(s): INR in the last 168 hours.  No results found for: LABCA2  No results found for: CAN199  Lab Results  Component Value Date   CAN125 35.5 02/07/2020    No results found for: LOV564  No results found for: CA2729  No components found for: HGQUANT  No results found for: CEA1 / No results found for: CEA1   No results found for: AFPTUMOR  No results found for: CHROMOGRNA  No results found for: KPAFRELGTCHN, LAMBDASER, KAPLAMBRATIO (kappa/lambda light chains)  No results found for: HGBA, HGBA2QUANT, HGBFQUANT, HGBSQUAN (Hemoglobinopathy evaluation)   No results found for: LDH  No results found for: IRON, TIBC, IRONPCTSAT (Iron and TIBC)  No results found for: FERRITIN  Urinalysis    Component Value Date/Time   BILIRUBINUR negative 07/20/2018 1108   PROTEINUR Negative 07/20/2018 1108   UROBILINOGEN 0.2 07/20/2018 1108   NITRITE negative 07/20/2018 1108   LEUKOCYTESUR Negative 07/20/2018 1108    STUDIES: VAS [place] LOWER EXT VENOUS POST ABLATION  Result Date: 02/17/2020  Lower Venous DVT Study Comparison Study: 01/13/20 EHIT 3 deep vein thrombosis Performing Technologist: June Leap RDMS, RVT  Examination Guidelines: A complete evaluation includes B-mode imaging, spectral  Doppler, color Doppler, and power Doppler as needed of all accessible portions of each vessel. Bilateral testing is considered an integral part of a complete examination. Limited examinations for reoccurring indications may be performed as noted. The reflux portion of the exam is performed with the patient in reverse Trendelenburg.  Post Ablation: Follow up evaluation status post ablation of left Great Saphenous Vein on 12/30/19.  +----+---------------+---------+-----------+----------+-----------------+ LEFTCompressibilityPhasicitySpontaneityPropertiesThrombus Aging    +----+---------------+---------+-----------+----------+-----------------+ GSV None                                         Age Indeterminate +----+---------------+---------+-----------+----------+-----------------+     Post Ablation Summary: - No evidence of deep vein thrombosis from the left common femoral through the popliteal veins. - Successful ablation of the right Great Saphenous Vein from the Knee to 1.05cm from the SFJ.  *See table(s) above for measurements and observations. Electronically signed by Deitra Mayo MD on 02/17/2020 at 1:14:29 PM.    Final      ELIGIBLE FOR AVAILABLE RESEARCH PROTOCOL: AET  ASSESSMENT: 55 y.o. Vanessa Allen status post right breast biopsy 08/25/2019 for ductal carcinoma in situ, grade 2 or 3, estrogen and progesterone receptor positive  (1) genetics testing 09/09/2019 found a heterozygous pathogenic variant  in the BRCA2 gene called c.[id number] (p.[id number]*[id number])  (a) no additional deleterious mutations were noted through the [facility] Breast Cancer STAT panel in ATM, BRCA1, BRCA2, CDH1, CHEK2,  PALB2, PTEN, STK11 and TP53.  (2) right lumpectomy and sentinel lymph node sampling 09/23/2019 showed ductal carcinoma in situ, largest section 0.5 cm, with negative margins: pTis pN0, stage 0  (a) a total of 2 axillary lymph nodes were removed  (3) adjuvant radiation completed 12/08/2019  (50.4 Gray to the right breast plus boost)  (4) tamoxifen started 12/08/2019  (5) bilateral salpingo-oophorectomy pending  (6) intensified screening in follow-up    PLAN: Vanessa Allen is tolerating the tamoxifen generally well.  She thought her drop in the white cell count was due to tamoxifen but actually it is more likely to be due to radiation.  This may take some time to recover and during that time she would be at increased risk of COVID-19 infection.  I have written a letter so that she can try to be placed at work in a more protected area if that is possible.  She does need her bilateral salpingo-oophorectomy and that is scheduled for late March.  We are going to resume mammography tomorrow.  She will have a breast MRI 6 months later, in August.  I am going to see her in May just to make sure everything is in place  She knows to call for any other issue that may develop before the next visit  Total encounter time 25 minutes.Vanessa Allen Jews C. Magrinat, MD 03/14/2020 1:35 PM Medical Oncology and Hematology [facility] [place], [place] [address] Tel. [phone]    Fax. [phone]   This document serves as a record of services personally performed by Lurline Del, MD. It was created on his behalf by Wilburn Mylar, a trained medical scribe. The creation of this record is based on the scribe's personal observations and the provider's statements to them.   I, Lurline Del MD, have reviewed the above documentation for accuracy and completeness, and I agree with the above.   *Total Encounter Time as defined by the Centers for Medicare and Medicaid Services includes, in addition to the face-to-face time of a patient visit (documented in the note above) non-face-to-face time: obtaining and reviewing outside history, ordering and reviewing medications, tests or procedures, care coordination (communications with other health care professionals or  caregivers) and documentation in the medical record.

## 2020-03-14 ENCOUNTER — Other Ambulatory Visit: Payer: Self-pay

## 2020-03-14 ENCOUNTER — Encounter: Payer: Self-pay | Admitting: Oncology

## 2020-03-14 ENCOUNTER — Encounter: Payer: BC Managed Care – PPO | Admitting: Gynecologic Oncology

## 2020-03-14 ENCOUNTER — Inpatient Hospital Stay: Payer: BC Managed Care – PPO | Attending: Oncology | Admitting: Oncology

## 2020-03-14 ENCOUNTER — Inpatient Hospital Stay: Payer: BC Managed Care – PPO

## 2020-03-14 VITALS — BP 116/84 | HR 107 | Temp 97.7°F | Resp 18 | Ht 66.0 in | Wt 179.5 lb

## 2020-03-14 DIAGNOSIS — C50911 Malignant neoplasm of unspecified site of right female breast: Secondary | ICD-10-CM

## 2020-03-14 DIAGNOSIS — N898 Other specified noninflammatory disorders of vagina: Secondary | ICD-10-CM | POA: Diagnosis not present

## 2020-03-14 DIAGNOSIS — D0511 Intraductal carcinoma in situ of right breast: Secondary | ICD-10-CM | POA: Insufficient documentation

## 2020-03-14 DIAGNOSIS — Z1501 Genetic susceptibility to malignant neoplasm of breast: Secondary | ICD-10-CM

## 2020-03-14 DIAGNOSIS — Z923 Personal history of irradiation: Secondary | ICD-10-CM | POA: Diagnosis not present

## 2020-03-14 LAB — CBC WITH DIFFERENTIAL (CANCER CENTER ONLY)
Abs Immature Granulocytes: 0.01 10*3/uL (ref 0.00–0.07)
Basophils Absolute: 0 10*3/uL (ref 0.0–0.1)
Basophils Relative: 1 %
Eosinophils Absolute: 0.1 10*3/uL (ref 0.0–0.5)
Eosinophils Relative: 2 %
HCT: 38.5 % (ref 36.0–46.0)
Hemoglobin: 13 g/dL (ref 12.0–15.0)
Immature Granulocytes: 0 %
Lymphocytes Relative: 40 %
Lymphs Abs: 1.3 10*3/uL (ref 0.7–4.0)
MCH: 27.9 pg (ref 26.0–34.0)
MCHC: 33.8 g/dL (ref 30.0–36.0)
MCV: 82.6 fL (ref 80.0–100.0)
Monocytes Absolute: 0.3 10*3/uL (ref 0.1–1.0)
Monocytes Relative: 10 %
Neutro Abs: 1.5 10*3/uL — ABNORMAL LOW (ref 1.7–7.7)
Neutrophils Relative %: 47 %
Platelet Count: 234 10*3/uL (ref 150–400)
RBC: 4.66 MIL/uL (ref 3.87–5.11)
RDW: 13.3 % (ref 11.5–15.5)
WBC Count: 3.2 10*3/uL — ABNORMAL LOW (ref 4.0–10.5)
nRBC: 0 % (ref 0.0–0.2)

## 2020-03-14 LAB — CMP (CANCER CENTER ONLY)
ALT: 31 U/L (ref 0–44)
AST: 22 U/L (ref 15–41)
Albumin: 4.1 g/dL (ref 3.5–5.0)
Alkaline Phosphatase: 63 U/L (ref 38–126)
Anion gap: 10 (ref 5–15)
BUN: 10 mg/dL (ref 6–20)
CO2: 22 mmol/L (ref 22–32)
Calcium: 9.4 mg/dL (ref 8.9–10.3)
Chloride: 108 mmol/L (ref 98–111)
Creatinine: 1.11 mg/dL — ABNORMAL HIGH (ref 0.44–1.00)
GFR, Estimated: 60 mL/min — ABNORMAL LOW (ref >60)
Glucose, Bld: 125 mg/dL — ABNORMAL HIGH (ref 70–99)
Potassium: 4.2 mmol/L (ref 3.5–5.1)
Sodium: 140 mmol/L (ref 135–145)
Total Bilirubin: 0.6 mg/dL (ref 0.3–1.2)
Total Protein: 7.2 g/dL (ref 6.5–8.1)

## 2020-03-15 ENCOUNTER — Ambulatory Visit
Admission: RE | Admit: 2020-03-15 | Discharge: 2020-03-15 | Disposition: A | Discharge status: Home / Self Care | Payer: BC Managed Care – PPO | Source: Ambulatory Visit | Attending: Oncology | Admitting: Oncology

## 2020-03-15 ENCOUNTER — Telehealth: Payer: Self-pay | Admitting: Oncology

## 2020-03-15 DIAGNOSIS — E119 Type 2 diabetes mellitus without complications: Secondary | ICD-10-CM

## 2020-03-15 DIAGNOSIS — D0511 Intraductal carcinoma in situ of right breast: Secondary | ICD-10-CM

## 2020-03-15 DIAGNOSIS — C50911 Malignant neoplasm of unspecified site of right female breast: Secondary | ICD-10-CM

## 2020-03-15 DIAGNOSIS — R922 Inconclusive mammogram: Secondary | ICD-10-CM | POA: Diagnosis not present

## 2020-03-15 DIAGNOSIS — Z1501 Genetic susceptibility to malignant neoplasm of breast: Secondary | ICD-10-CM

## 2020-03-15 DIAGNOSIS — Z1379 Encounter for other screening for genetic and chromosomal anomalies: Secondary | ICD-10-CM

## 2020-03-15 HISTORY — DX: Personal history of irradiation: Z92.3

## 2020-03-15 IMAGING — MG DIGITAL DIAGNOSTIC BILAT W/ TOMO W/ CAD
6 of 9 series · 6 of 25 positions shown · non-contrast
Comparison: Previous exam(s).

CLINICAL DATA: Status post right lumpectomy for DCIS, performed
[DATE], treated with adjuvant radiation therapy. No current
breast complaints.

EXAM:
DIGITAL DIAGNOSTIC BILATERAL MAMMOGRAM WITH TOMOSYNTHESIS AND CAD
TECHNIQUE: Bilateral digital diagnostic mammography and breast tomosynthesis
was performed. The images were evaluated with computer-aided
detection.

[R CC]
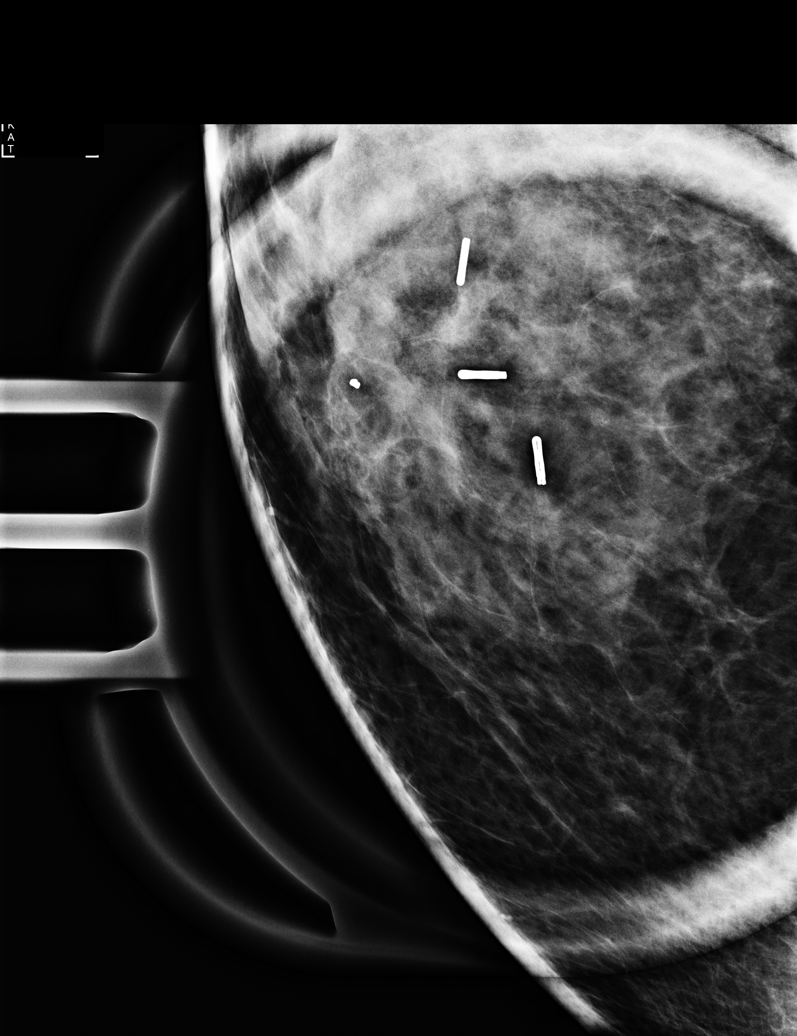

[R CC synth-2D]
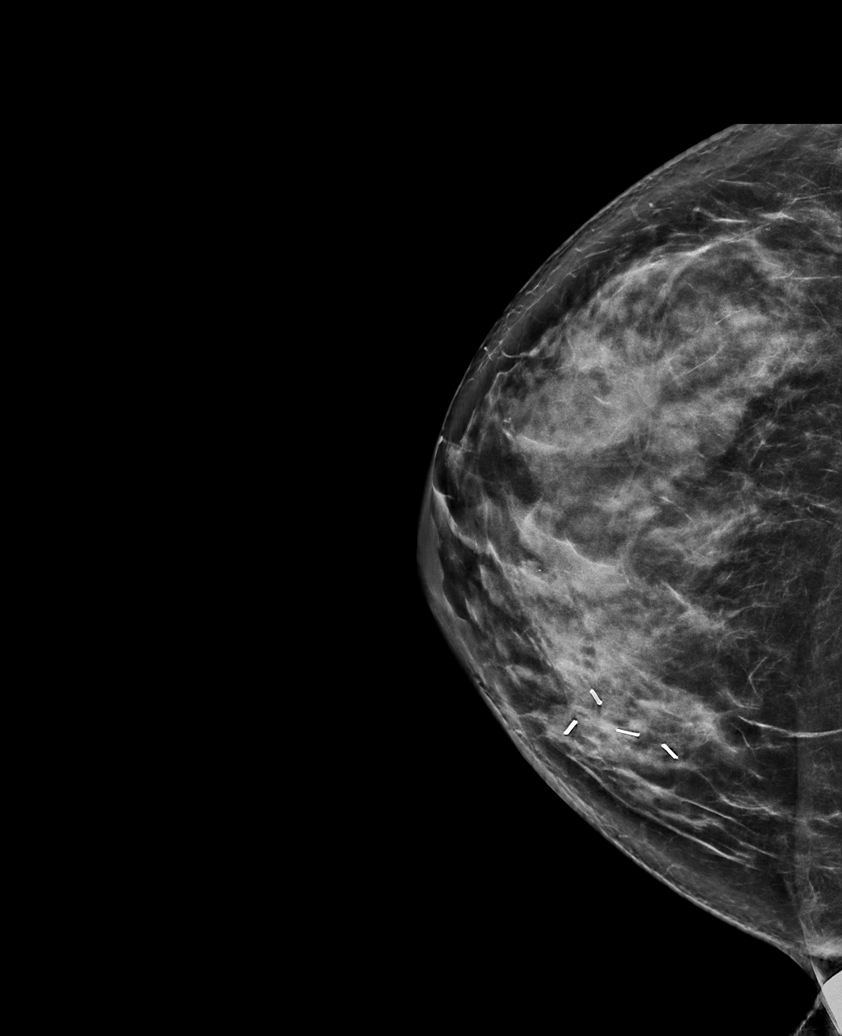

[R MLO synth-2D]
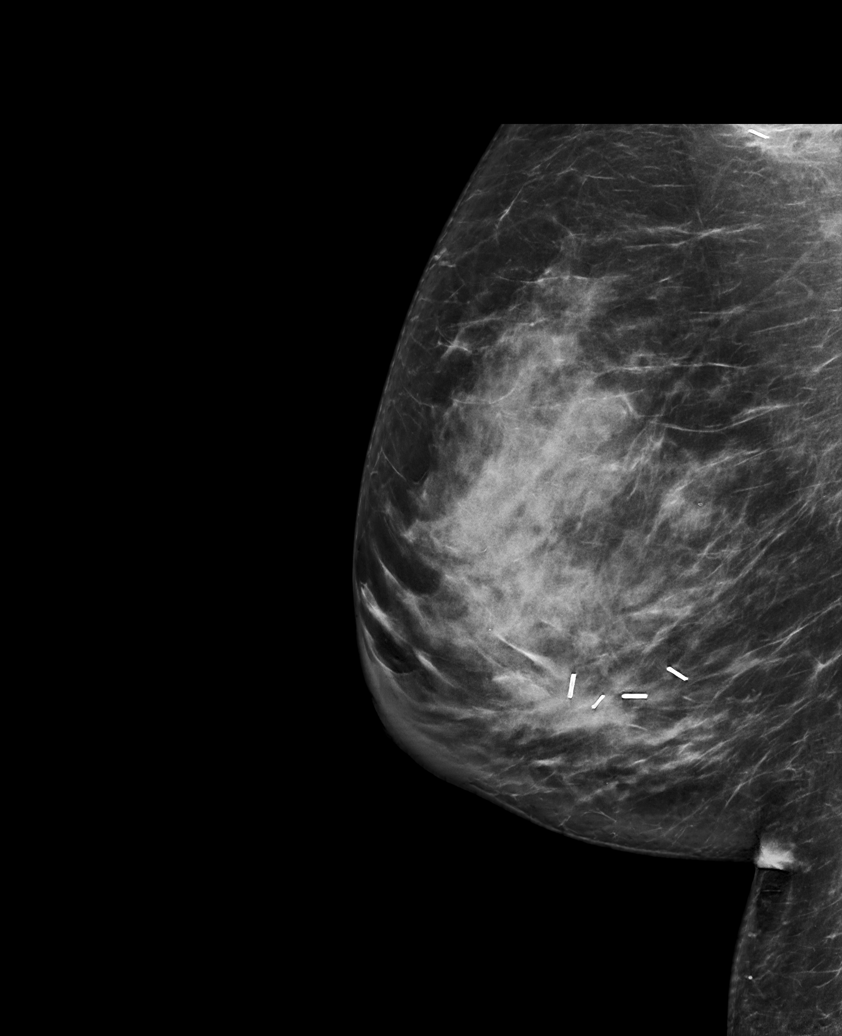

[L MLO synth-2D]
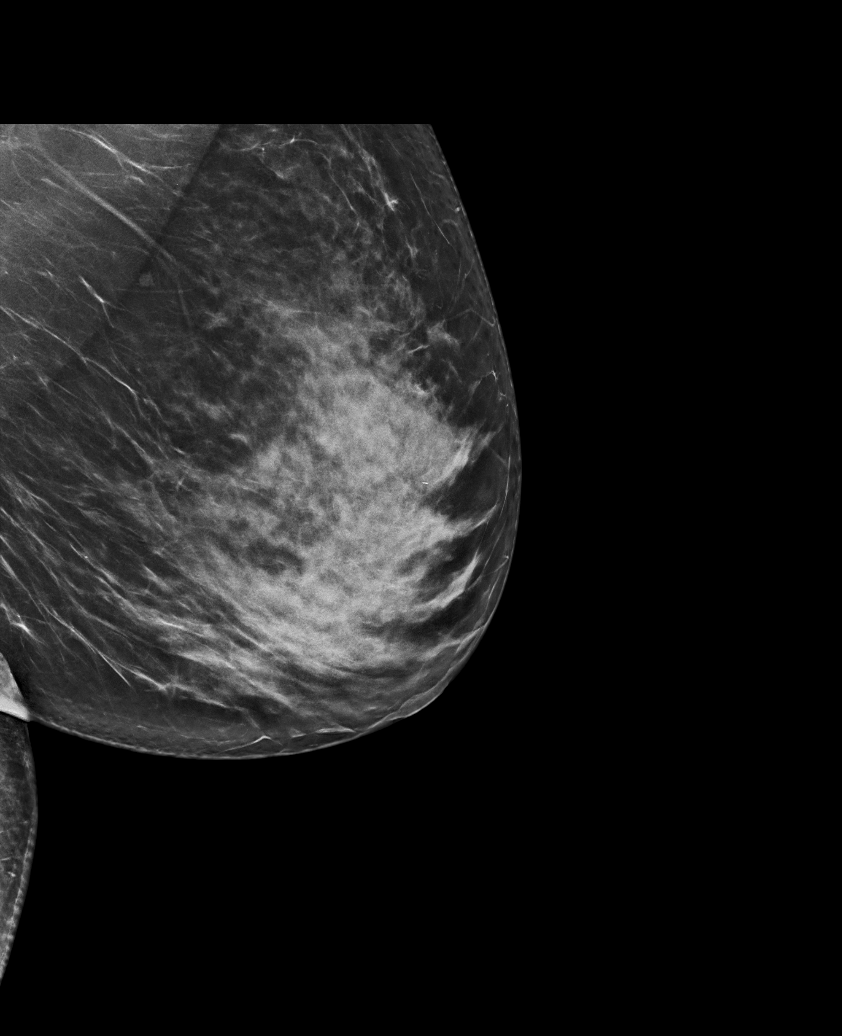

[L CC synth-2D]
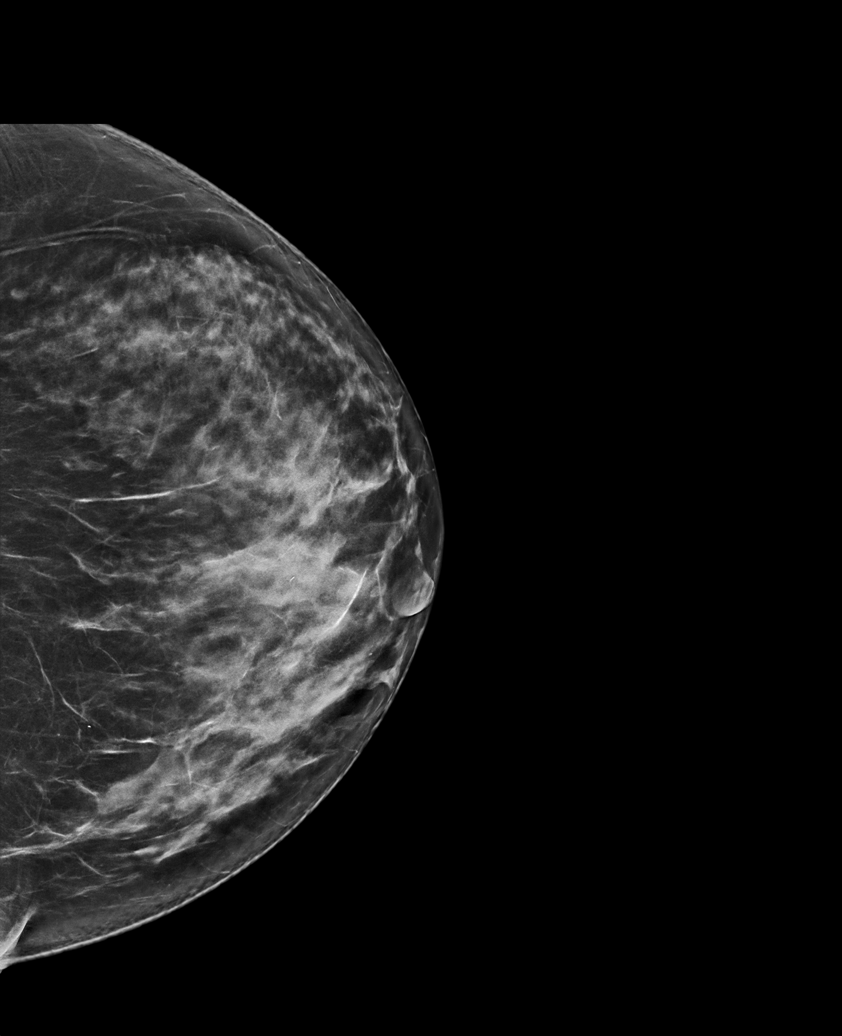

[L CC tomo · tomo slice 39/76.0]
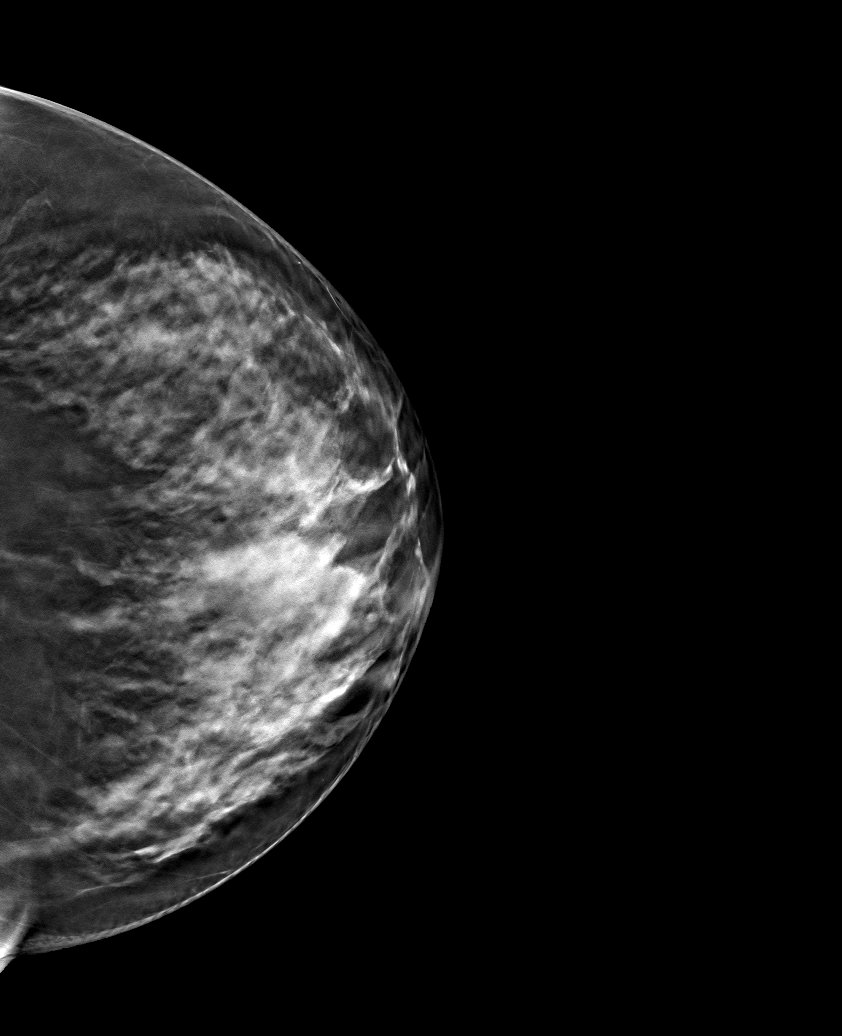

[6 of 25 positions shown; findings below may reference images not displayed]

ACR Breast Density Category d: The breast tissue is extremely dense,
which lowers the sensitivity of mammography.
FINDINGS: Post lumpectomy changes are noted along the medial aspect of the
right breast.

There are no masses or areas of nonsurgical architectural
distortion. There are no new or suspicious calcifications.
IMPRESSION: 1. No evidence of new or recurrent breast carcinoma.
2. Benign post lumpectomy changes on the right.

RECOMMENDATION:
Diagnostic mammogram in 1 year per standard post lumpectomy
protocol. (Code:[CC])

I have discussed the findings and recommendations with the patient.
If applicable, a reminder letter will be sent to the patient
regarding the next appointment.

BI-RADS CATEGORY  2: Benign.

## 2020-03-15 NOTE — Telephone Encounter (Signed)
Scheduled appts per 2/16 sch msg. Left voicemail with appt date and time.

## 2020-03-17 DIAGNOSIS — G4733 Obstructive sleep apnea (adult) (pediatric): Secondary | ICD-10-CM | POA: Diagnosis not present

## 2020-03-27 ENCOUNTER — Ambulatory Visit: Payer: BC Managed Care – PPO

## 2020-03-27 ENCOUNTER — Encounter: Payer: Self-pay | Admitting: Gynecologic Oncology

## 2020-03-28 NOTE — Telephone Encounter (Signed)
Her pre-surgical testing appointmnet was change to 04-21-20 from 04-13-20 per appointment sheet.

## 2020-04-05 ENCOUNTER — Telehealth: Payer: Self-pay | Admitting: *Deleted

## 2020-04-05 ENCOUNTER — Encounter: Payer: Self-pay | Admitting: Family Medicine

## 2020-04-05 DIAGNOSIS — G4731 Primary central sleep apnea: Secondary | ICD-10-CM

## 2020-04-05 NOTE — Telephone Encounter (Signed)
Fax patient FMLA paperwork

## 2020-04-07 ENCOUNTER — Telehealth: Payer: Self-pay | Admitting: *Deleted

## 2020-04-07 ENCOUNTER — Other Ambulatory Visit: Payer: Self-pay | Admitting: Nurse Practitioner

## 2020-04-07 DIAGNOSIS — Z20822 Contact with and (suspected) exposure to covid-19: Secondary | ICD-10-CM | POA: Diagnosis not present

## 2020-04-07 DIAGNOSIS — F419 Anxiety disorder, unspecified: Secondary | ICD-10-CM

## 2020-04-07 NOTE — Telephone Encounter (Signed)
XANAX REFILL

## 2020-04-07 NOTE — Telephone Encounter (Signed)
Called and scheduled the patient for a pre op appt for 3/25

## 2020-04-11 NOTE — Progress Notes (Signed)
Patient here for a pre-operative appointment prior to her scheduled surgery on April 25, 2020. She is scheduled for a robotic assisted laparoscopic bilateral salpingo-oophorectomy (removal of both ovaries and fallopian tubes), possible laparotomy (larger incision on your abdomen if needed).  She had her pre-admission testing appointment this am at [facility].  Labs are still pending. The surgery was discussed in detail.  See after visit summary for additional details. Visual aids used to discuss items related to surgery including the incentive spirometer, sequential compression stockings, foley catheter, IV pump, multi-modal pain regimen including tylenol, photo of the surgical robot, female reproductive system to discuss surgery in detail.      Discussed post-op pain management in detail including the aspects of the enhanced recovery pathway.  Advised her that a new prescriptions would be sent in for hydrocodone/APAP and it is only to be used for after her upcoming surgery.  We discussed the use of tylenol post-op and to monitor for a maximum of 4,000 mg in a 24 hour period.  Also prescribed sennakot to be used after surgery and to hold if having loose stools.  Discussed bowel regimen in detail.     Discussed the use of SCDs and measures to take at home to prevent DVT including frequent mobility.  Reportable signs and symptoms of DVT discussed. Post-operative instructions discussed and expectations for after surgery. Incisional care discussed as well including reportable signs and symptoms including erythema, drainage, wound separation. Lungs clear. Heart regular in rate and rhythm.    5 minutes spent with the patient.  Verbalizing understanding of material discussed. No needs or concerns voiced at the end of the visit.   Advised patient and family to call for any needs.  Advised that her post-operative medications had been prescribed and could be picked up at any time.    No charge-included in global  surgical fee for pre-op.

## 2020-04-11 NOTE — Patient Instructions (Addendum)
Preparing for your Surgery  Plan for surgery on April 25, 2020 with Dr. Jeral Pinch at [facility] will be scheduled for a robotic assisted laparoscopic bilateral salpingo-oophorectomy (removal of both ovaries and fallopian tubes), possible laparotomy (larger incision on your abdomen if needed).   Pre-operative Testing -(DONE) You will receive a phone call from presurgical testing at [facility] to arrange for a pre-operative appointment, labs, and COVID test. The COVID test normally happens 3 days prior to the surgery and they ask that you self quarantine after the test up until surgery to decrease chance of exposure.  -Bring your insurance card, copy of an advanced directive if applicable, medication list  -At that visit, you will be asked to sign a consent for a possible blood transfusion in case a transfusion becomes necessary during surgery.  The need for a blood transfusion is rare but having consent is a necessary part of your care.     -You can continue taking your aspirin 81 mg with the last dose on the day before surgery.  -Do not take supplements such as fish oil (omega 3), red yeast rice, turmeric before your surgery. You want to avoid medications with aspirin in them including headache powders such as BC or Goody's), Excedrin migraine. PLEASE STOP THESE MEDICATIONS NOW.  -Avoid use of mobic (meloxicam) before surgery starting now.   Day Before Surgery at [place] will be asked to take in a light diet the day before surgery. You will be advised you can have clear liquids up until 3 hours before your surgery.    Eat a light diet the day before surgery.  Examples including soups, broths, toast, yogurt, mashed potatoes.  AVOID GAS PRODUCING FOODS. Things to avoid include carbonated beverages (fizzy beverages, sodas), raw fruits and raw vegetables (uncooked), or beans.   If your bowels are filled with gas, your surgeon will have difficulty visualizing your  pelvic organs which increases your surgical risks.  Your role in recovery Your role is to become active as soon as directed by your doctor, while still giving yourself time to heal.  Rest when you feel tired. You will be asked to do the following in order to speed your recovery:  - Cough and breathe deeply. This helps to clear and expand your lungs and can prevent pneumonia after surgery.  - [place]. Do mild physical activity. Walking or moving your legs help your circulation and body functions return to normal. Do not try to get up or walk alone the first time after surgery.   -If you develop swelling on one leg or the other, pain in the back of your leg, redness/warmth in one of your legs, please call the office or go to the Emergency Room to have a doppler to rule out a blood clot. For shortness of breath, chest pain-seek care in the Emergency Room as soon as possible. - Actively manage your pain. Managing your pain lets you move in comfort. We will ask you to rate your pain on a scale of zero to 10. It is your responsibility to tell your doctor or nurse where and how much you hurt so your pain can be treated.  Special Considerations -If you are diabetic, you may be placed on insulin after surgery to have closer control over your blood sugars to promote healing and recovery.  This does not mean that you will be discharged on insulin.  If applicable, your oral antidiabetics will  be resumed when you are tolerating a solid diet.  -Your final pathology results from surgery should be available around one week after surgery and the results will be relayed to you when available.  -Dr. Lahoma Crocker is the surgeon that assists your GYN Oncologist with surgery.  If you end up staying the night, the next day after your surgery you will either see Dr. Denman George, Dr. Berline Lopes, or Dr. Lahoma Crocker.  -FMLA forms can be faxed to [phone] and please allow 5-7 business days for  completion.  Pain Management After Surgery -You have been prescribed your pain medication and bowel regimen medications before surgery so that you can have these available when you are discharged from the hospital. The pain medication is for use ONLY AFTER surgery and a new prescription will not be given.   -Make sure that you have Tylenol and Ibuprofen at home to use on a regular basis after surgery for pain control. We recommend alternating the medications every hour to six hours since they work differently and are processed in the body differently for pain relief.  -Review the attached handout on narcotic use and their risks and side effects.   Bowel Regimen -You have been prescribed Sennakot-S to take nightly to prevent constipation especially if you are taking the narcotic pain medication intermittently.  It is important to prevent constipation and drink adequate amounts of liquids. You can stop taking this medication when you are not taking pain medication and you are back on your normal bowel routine.  Risks of Surgery Risks of surgery are low but include bleeding, infection, damage to surrounding structures, re-operation, blood clots, and very rarely death.   Blood Transfusion Information (For the consent to be signed before surgery)  We will be checking your blood type before surgery so in case of emergencies, we will know what type of blood you would need.                                            WHAT IS A BLOOD TRANSFUSION?  A transfusion is the replacement of blood or some of its parts. Blood is made up of multiple cells which provide different functions.  Red blood cells carry oxygen and are used for blood loss replacement.  White blood cells fight against infection.  Platelets control bleeding.  Plasma helps clot blood.  Other blood products are available for specialized needs, such as hemophilia or other clotting disorders. BEFORE THE TRANSFUSION  Who gives blood  for transfusions?   You may be able to donate blood to be used at a later date on yourself (autologous donation).  Relatives can be asked to donate blood. This is generally not any safer than if you have received blood from a stranger. The same precautions are taken to ensure safety when a relative's blood is donated.  Healthy volunteers who are fully evaluated to make sure their blood is safe. This is blood bank blood. Transfusion therapy is the safest it has ever been in the practice of medicine. Before blood is taken from a donor, a complete history is taken to make sure that person has no history of diseases nor engages in risky social behavior (examples are intravenous drug use or sexual activity with multiple partners). The donor's travel history is screened to minimize risk of transmitting infections, such as malaria. The donated blood is tested for  signs of infectious diseases, such as HIV and hepatitis. The blood is then tested to be sure it is compatible with you in order to minimize the chance of a transfusion reaction. If you or a relative donates blood, this is often done in anticipation of surgery and is not appropriate for emergency situations. It takes many days to process the donated blood. RISKS AND COMPLICATIONS Although transfusion therapy is very safe and saves many lives, the main dangers of transfusion include:   Getting an infectious disease.  Developing a transfusion reaction. This is an allergic reaction to something in the blood you were given. Every precaution is taken to prevent this. The decision to have a blood transfusion has been considered carefully by your caregiver before blood is given. Blood is not given unless the benefits outweigh the risks.  AFTER SURGERY INSTRUCTIONS  Return to work: 4 weeks if applicable  Activity: 1. Be up and out of the bed during the day.  Take a nap if needed.  You may walk up steps but be careful and use the hand rail.  Stair  climbing will tire you more than you think, you may need to stop part way and rest.   2. No lifting or straining for 6 weeks over 10 pounds. No pushing, pulling, straining for 6 weeks.  3. No driving for 1 week(s).  Do not drive if you are taking narcotic pain medicine and make sure that your reaction time has returned.   4. You can shower as soon as the next day after surgery. Shower daily.  Use your regular soap and water (not directly on the incision) and pat your incision(s) dry afterwards; don't rub.  No tub baths or submerging your body in water until cleared by your surgeon. If you have the soap that was given to you by pre-surgical testing that was used before surgery, you do not need to use it afterwards because this can irritate your incisions.   5. No sexual activity and nothing in the vagina for 4 weeks.  6. You may experience a small amount of clear drainage from your incisions, which is normal.  If the drainage persists, increases, or changes color please call the office.  7. Do not use creams, lotions, or ointments such as neosporin on your incisions after surgery until advised by your surgeon because they can cause removal of the dermabond glue on your incisions.    8. You may experience vaginal spotting after surgery.  The spotting is normal but if you experience heavy bleeding, call our office.  9. Take Tylenol or ibuprofen first for pain and only use narcotic pain medication for severe pain not relieved by the Tylenol or Ibuprofen.  Monitor your Tylenol intake to a max of 4,000 mg in a 24 hour period. You can alternate these medications after surgery.  Diet: 1. Low sodium Heart Healthy Diet is recommended but you are cleared to resume your normal (before surgery) diet after your procedure.  2. It is safe to use a laxative, such as Miralax or Colace, if you have difficulty moving your bowels. You have been prescribed Sennakot at bedtime every evening to keep bowel movements  regular and to prevent constipation.    Wound Care: 1. Keep clean and dry.  Shower daily.  Reasons to call the Doctor:  Fever - Oral temperature greater than 100.4 degrees Fahrenheit  Foul-smelling vaginal discharge  Difficulty urinating  Nausea and vomiting  Increased pain at the site of the incision  that is unrelieved with pain medicine.  Difficulty breathing with or without chest pain  New calf pain especially if only on one side  Sudden, continuing increased vaginal bleeding with or without clots.   Contacts: For questions or concerns you should contact:  Dr. Jeral Pinch at [phone]  Joylene John, NP at [phone]  After Hours: call [phone] and have the GYN Oncologist paged/contacted (after 5 pm or on the weekends).  Messages sent via mychart are for non-urgent matters and are not responded to after hours so for urgent needs, please call the after hours number.

## 2020-04-12 NOTE — Telephone Encounter (Signed)
Cm sent to [facility]

## 2020-04-13 ENCOUNTER — Other Ambulatory Visit (HOSPITAL_COMMUNITY): Payer: BC Managed Care – PPO

## 2020-04-13 DIAGNOSIS — C50911 Malignant neoplasm of unspecified site of right female breast: Secondary | ICD-10-CM

## 2020-04-14 DIAGNOSIS — G4733 Obstructive sleep apnea (adult) (pediatric): Secondary | ICD-10-CM | POA: Diagnosis not present

## 2020-04-19 NOTE — Progress Notes (Signed)
DUE TO COVID-19 ONLY ONE VISITOR IS ALLOWED TO COME WITH YOU AND STAY IN THE WAITING ROOM ONLY DURING PRE OP AND PROCEDURE DAY OF SURGERY. THE 1 VISITOR  MAY VISIT WITH YOU AFTER SURGERY IN YOUR PRIVATE ROOM DURING VISITING HOURS ONLY!  YOU NEED TO HAVE A COVID 19 TEST ON___3/25/2022 ____ @_______ , THIS TEST MUST BE DONE BEFORE SURGERY,  COVID TESTING SITE [address] [place] [address], IT IS ON THE RIGHT GOING OUT [address] APPROXIMATELY  2 MINUTES PAST [employer] ON THE RIGHT. ONCE YOUR COVID TEST IS COMPLETED,  PLEASE BEGIN THE QUARANTINE INSTRUCTIONS AS OUTLINED IN YOUR HANDOUT.                Vanessa Allen  04/19/2020   Your procedure is scheduled on:  04/25/2020  Report to [facility] Main  Entrance   Report to admitting at    0530am  AM     Call this number if you have problems the morning of surgery [phone]    Remember: Do not eat food , candy gum or mints :After Midnight. You may have clear liquids from midnight until 0430am     CLEAR LIQUID DIET   Foods Allowed                                                                       Coffee and tea, regular and decaf                              Plain Jell-O any favor except red or purple                                            Fruit ices (not with fruit pulp)                                      Iced Popsicles                                     Carbonated beverages, regular and diet                                    Cranberry, grape and apple juices Sports drinks like Gatorade Lightly seasoned clear broth or consume(fat free) Sugar, honey syrup   _____________________________________________________________________    BRUSH YOUR TEETH MORNING OF SURGERY AND RINSE YOUR MOUTH OUT, NO CHEWING GUM CANDY OR MINTS.     Take these medicines the morning of surgery with A SIP OF WATER:  Tamoxifen, nexium DO NOT TAKE ANY DIABETIC MEDICATIONS DAY OF YOUR SURGERY                                You may not have any metal on your  body including hair pins and              piercings  Do not wear jewelry, make-up, lotions, powders or perfumes, deodorant             Do not wear nail polish on your fingernails.  Do not shave  48 hours prior to surgery.              Men may shave face and neck.   Do not bring valuables to the hospital. [place].  Contacts, dentures or bridgework may not be worn into surgery.  Leave suitcase in the car. After surgery it may be brought to your room.     Patients discharged the day of surgery will not be allowed to drive home. IF YOU ARE HAVING SURGERY AND GOING HOME THE SAME DAY, YOU MUST HAVE AN ADULT TO DRIVE YOU HOME AND BE WITH YOU FOR 24 HOURS. YOU MAY GO HOME BY TAXI OR UBER OR ORTHERWISE, BUT AN ADULT MUST ACCOMPANY YOU HOME AND STAY WITH YOU FOR 24 HOURS.  Name and phone number of your driver:  Special Instructions: N/A              Please read over the following fact sheets you were given: _____________________________________________________________________  [facility] - Preparing for Surgery Before surgery, you can play an important role.  Because skin is not sterile, your skin needs to be as free of germs as possible.  You can reduce the number of germs on your skin by washing with CHG (chlorahexidine gluconate) soap before surgery.  CHG is an antiseptic cleaner which kills germs and bonds with the skin to continue killing germs even after washing. Please DO NOT use if you have an allergy to CHG or antibacterial soaps.  If your skin becomes reddened/irritated stop using the CHG and inform your nurse when you arrive at Short Stay. Do not shave (including legs and underarms) for at least 48 hours prior to the first CHG shower.  You may shave your face/neck. Please follow these instructions carefully:  1.  Shower with CHG Soap the night before surgery and the  morning of Surgery.  2.   If you choose to wash your hair, wash your hair first as usual with your  normal  shampoo.  3.  After you shampoo, rinse your hair and body thoroughly to remove the  shampoo.                           4.  Use CHG as you would any other liquid soap.  You can apply chg directly  to the skin and wash                       Gently with a scrungie or clean washcloth.  5.  Apply the CHG Soap to your body ONLY FROM THE NECK DOWN.   Do not use on face/ open                           Wound or open sores. Avoid contact with eyes, ears mouth and genitals (private parts).                       Wash face,  Development worker, international aid (private  parts) with your normal soap.             6.  Wash thoroughly, paying special attention to the area where your surgery  will be performed.  7.  Thoroughly rinse your body with warm water from the neck down.  8.  DO NOT shower/wash with your normal soap after using and rinsing off  the CHG Soap.                9.  Pat yourself dry with a clean towel.            10.  Wear clean pajamas.            11.  Place clean sheets on your bed the night of your first shower and do not  sleep with pets. Day of Surgery : Do not apply any lotions/deodorants the morning of surgery.  Please wear clean clothes to the hospital/surgery center.  FAILURE TO FOLLOW THESE INSTRUCTIONS MAY RESULT IN THE CANCELLATION OF YOUR SURGERY PATIENT SIGNATURE_________________________________  NURSE SIGNATURE__________________________________  ________________________________________________________________________

## 2020-04-20 ENCOUNTER — Telehealth: Payer: Self-pay | Admitting: *Deleted

## 2020-04-20 ENCOUNTER — Encounter: Payer: Self-pay | Admitting: Gynecologic Oncology

## 2020-04-20 NOTE — Telephone Encounter (Signed)
Phone call to patient to complete meaningful use questions.  Patient is requesting scopolamine patch prior to surgery, has hx of post op nausea & vomiting and this has worked well for her in the past.

## 2020-04-21 ENCOUNTER — Inpatient Hospital Stay: Payer: BC Managed Care – PPO | Attending: Gynecologic Oncology | Admitting: Gynecologic Oncology

## 2020-04-21 ENCOUNTER — Encounter (HOSPITAL_COMMUNITY): Payer: Self-pay

## 2020-04-21 ENCOUNTER — Telehealth: Payer: Self-pay | Admitting: Gynecologic Oncology

## 2020-04-21 ENCOUNTER — Encounter: Payer: Self-pay | Admitting: Gynecologic Oncology

## 2020-04-21 ENCOUNTER — Other Ambulatory Visit: Payer: Self-pay

## 2020-04-21 ENCOUNTER — Other Ambulatory Visit (HOSPITAL_COMMUNITY): Payer: BC Managed Care – PPO

## 2020-04-21 ENCOUNTER — Encounter (HOSPITAL_COMMUNITY)
Admission: RE | Admit: 2020-04-21 | Discharge: 2020-04-21 | Disposition: A | Discharge status: Home / Self Care | Payer: BC Managed Care – PPO | Source: Ambulatory Visit | Attending: Gynecologic Oncology | Admitting: Gynecologic Oncology

## 2020-04-21 ENCOUNTER — Encounter: Payer: Self-pay | Admitting: Oncology

## 2020-04-21 VITALS — BP 134/96 | HR 85 | Temp 97.5°F | Resp 18 | Ht 66.0 in | Wt 185.0 lb

## 2020-04-21 DIAGNOSIS — C50911 Malignant neoplasm of unspecified site of right female breast: Secondary | ICD-10-CM

## 2020-04-21 DIAGNOSIS — Z1501 Genetic susceptibility to malignant neoplasm of breast: Secondary | ICD-10-CM

## 2020-04-21 DIAGNOSIS — Z01812 Encounter for preprocedural laboratory examination: Secondary | ICD-10-CM | POA: Diagnosis not present

## 2020-04-21 HISTORY — DX: Unspecified osteoarthritis, unspecified site: M19.90

## 2020-04-21 HISTORY — DX: Family history of other specified conditions: Z84.89

## 2020-04-21 LAB — CBC
HCT: 41.6 % (ref 36.0–46.0)
Hemoglobin: 13.7 g/dL (ref 12.0–15.0)
MCH: 28.1 pg (ref 26.0–34.0)
MCHC: 32.9 g/dL (ref 30.0–36.0)
MCV: 85.4 fL (ref 80.0–100.0)
Platelets: 254 10*3/uL (ref 150–400)
RBC: 4.87 MIL/uL (ref 3.87–5.11)
RDW: 13 % (ref 11.5–15.5)
WBC: 2.9 10*3/uL — ABNORMAL LOW (ref 4.0–10.5)
nRBC: 0 % (ref 0.0–0.2)

## 2020-04-21 LAB — URINALYSIS, ROUTINE W REFLEX MICROSCOPIC
Bacteria, UA: NONE SEEN
Bilirubin Urine: NEGATIVE
Glucose, UA: NEGATIVE mg/dL
Hgb urine dipstick: NEGATIVE
Ketones, ur: NEGATIVE mg/dL
Nitrite: NEGATIVE
Protein, ur: NEGATIVE mg/dL
Specific Gravity, Urine: 1.019 (ref 1.005–1.030)
pH: 6 (ref 5.0–8.0)

## 2020-04-21 LAB — BASIC METABOLIC PANEL
Anion gap: 9 (ref 5–15)
BUN: 14 mg/dL (ref 6–20)
CO2: 21 mmol/L — ABNORMAL LOW (ref 22–32)
Calcium: 9.2 mg/dL (ref 8.9–10.3)
Chloride: 112 mmol/L — ABNORMAL HIGH (ref 98–111)
Creatinine, Ser: 0.76 mg/dL (ref 0.44–1.00)
GFR, Estimated: >60 mL/min (ref >60)
Glucose, Bld: 99 mg/dL (ref 70–99)
Potassium: 3.7 mmol/L (ref 3.5–5.1)
Sodium: 142 mmol/L (ref 135–145)

## 2020-04-21 LAB — DIFFERENTIAL
Abs Immature Granulocytes: 0.01 10*3/uL (ref 0.00–0.07)
Basophils Absolute: 0 10*3/uL (ref 0.0–0.1)
Basophils Relative: 1 %
Eosinophils Absolute: 0 10*3/uL (ref 0.0–0.5)
Eosinophils Relative: 1 %
Immature Granulocytes: 0 %
Lymphocytes Relative: 44 %
Lymphs Abs: 1.3 10*3/uL (ref 0.7–4.0)
Monocytes Absolute: 0.3 10*3/uL (ref 0.1–1.0)
Monocytes Relative: 9 %
Neutro Abs: 1.4 10*3/uL — ABNORMAL LOW (ref 1.7–7.7)
Neutrophils Relative %: 45 %

## 2020-04-21 NOTE — Progress Notes (Addendum)
Anesthesia Review:  PCP: Darcey Nora, Np with [facility]  Cardiologist : Chest x-ray : EKG :07/26/19 Echo : Stress test: Cardiac Cath :  Activity level: can do a flight of stairs without difficulty  Sleep Study/ CPAP : yes Fasting Blood Sugar :      / Checks Blood Sugar -- times a day:   Blood Thinner/ Instructions /Last Dose: ASA / Instructions/ Last Dose :  DM- type 2 - has Freestyle LIbre  02/28/20- [id number] Temp 99.9 at preop. PT denies any covid symptoms.  Audria Nine made aware.  CBC done 04/21/20 with white count of 2.9 routed to Dr Berline Lopes and Zoila Shutter.

## 2020-04-21 NOTE — Progress Notes (Signed)
Pt unable to void at preop appt.  Pt has urine sample cup along with wipes and bag and given bottle water.  Pt has appt with OB/GYN oncology next door and will get urine sample and return to PST.

## 2020-04-21 NOTE — Telephone Encounter (Signed)
Called patient to discuss CBC results. Advised of white count of 2.9, decreased from 3.2 in Feb 2022. Advised patient that Dr. Berline Lopes feels it is too risky for her to proceed with elective surgery based on her white count. She is advised that we will reach out to her Medical Oncologist, Dr. Jana Hakim, to see if he needs to see the patient etc. Dr. Berline Lopes recommends repeat lab work in 3-4 weeks with plans to reschedule surgery at the end of April if her white count is improving. Patient verbalizing understanding. Will also offer an ultrasound during this waiting period and informed patient that a differential has been added on to her lab work from today. Advised to call for any needs. A mychart message will be sent with potential surgery dates in April 2022.

## 2020-04-21 NOTE — Progress Notes (Addendum)
CBC done 04/21/2020 routed to DR Berline Lopes and Zoila Shutter. U/A done 04/21/20 routied to Dr Berline Lopes and Zoila Shutter.

## 2020-04-23 ENCOUNTER — Other Ambulatory Visit: Payer: Self-pay | Admitting: Nurse Practitioner

## 2020-04-24 ENCOUNTER — Telehealth: Payer: Self-pay | Admitting: Oncology

## 2020-04-24 ENCOUNTER — Telehealth: Payer: Self-pay | Admitting: *Deleted

## 2020-04-24 ENCOUNTER — Other Ambulatory Visit: Payer: Self-pay | Admitting: Gynecologic Oncology

## 2020-04-24 ENCOUNTER — Other Ambulatory Visit: Payer: Self-pay | Admitting: Oncology

## 2020-04-24 DIAGNOSIS — Z1501 Genetic susceptibility to malignant neoplasm of breast: Secondary | ICD-10-CM

## 2020-04-24 DIAGNOSIS — D0511 Intraductal carcinoma in situ of right breast: Secondary | ICD-10-CM

## 2020-04-24 DIAGNOSIS — C50911 Malignant neoplasm of unspecified site of right female breast: Secondary | ICD-10-CM

## 2020-04-24 NOTE — Telephone Encounter (Signed)
Scheduled appt per 3/28 sch msg. Pt aware.  

## 2020-04-24 NOTE — Progress Notes (Signed)
[place] ordered per Dr. Berline Lopes to evaluate the ovaries and endometrium given hx of BRCA positivity and currently taking tamoxifen. Surgery has been postponed due to decrease in white blood cell count.

## 2020-04-24 NOTE — Progress Notes (Signed)
I morales counts were down.  It could be medication related.  It could be a transient viral illness.  I am rechecking her counts 05/02/2020 and checking a blood film as well  This has delayed her salpingo-oophorectomy

## 2020-04-24 NOTE — Telephone Encounter (Signed)
Scheduled the patient for an [place] scan for 4/1 at 10:30 am. Called the patient with the appt date/time and instructions

## 2020-04-25 DIAGNOSIS — C50911 Malignant neoplasm of unspecified site of right female breast: Secondary | ICD-10-CM

## 2020-04-25 LAB — TYPE AND SCREEN
ABO/RH(D): A POS
Antibody Screen: NEGATIVE

## 2020-04-28 ENCOUNTER — Ambulatory Visit (HOSPITAL_COMMUNITY)
Admission: RE | Admit: 2020-04-28 | Discharge: 2020-04-28 | Disposition: A | Discharge status: Home / Self Care | Payer: BC Managed Care – PPO | Source: Ambulatory Visit | Attending: Gynecologic Oncology | Admitting: Gynecologic Oncology

## 2020-04-28 ENCOUNTER — Other Ambulatory Visit: Payer: Self-pay

## 2020-04-28 DIAGNOSIS — C50911 Malignant neoplasm of unspecified site of right female breast: Secondary | ICD-10-CM

## 2020-04-28 DIAGNOSIS — Z1501 Genetic susceptibility to malignant neoplasm of breast: Secondary | ICD-10-CM | POA: Diagnosis not present

## 2020-04-28 DIAGNOSIS — R188 Other ascites: Secondary | ICD-10-CM | POA: Diagnosis not present

## 2020-04-28 IMAGING — US US PELVIS COMPLETE WITH TRANSVAGINAL
1 series · 14 of 25 positions shown · non-contrast
Comparison: None

CLINICAL DATA: BRCA positive, evaluate ovaries and endometrium, on
tamoxifen

EXAM:
TRANSABDOMINAL AND TRANSVAGINAL ULTRASOUND OF PELVIS
TECHNIQUE: Both transabdominal and transvaginal ultrasound examinations of the
pelvis were performed. Transabdominal technique was performed for
global imaging of the pelvis including uterus, ovaries, adnexal
regions, and pelvic cul-de-sac. It was necessary to proceed with
endovaginal exam following the transabdominal exam to visualize the
uterus endometrium ovaries.

[Series 1: us pelvis complete with transvaginal · 14 of 84 slices shown]
[im 1/84]
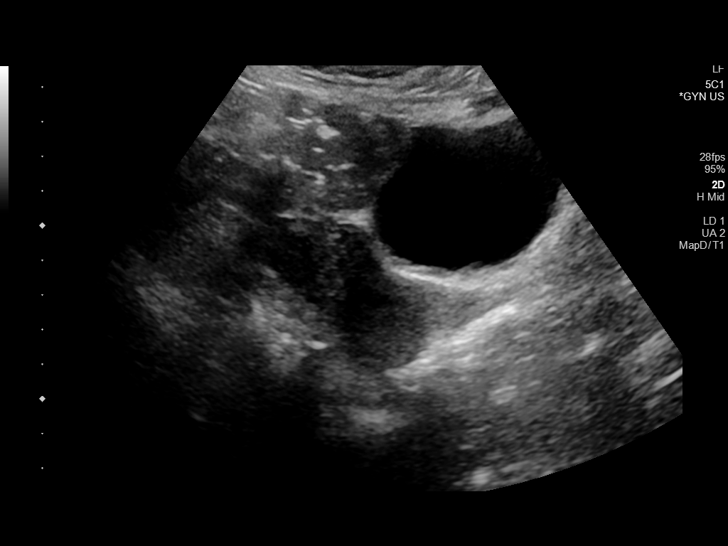
[im 7/84]
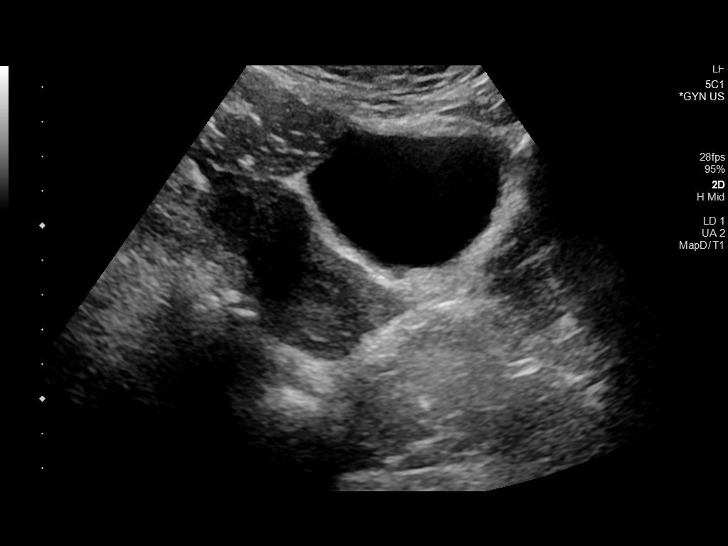
[im 14/84]
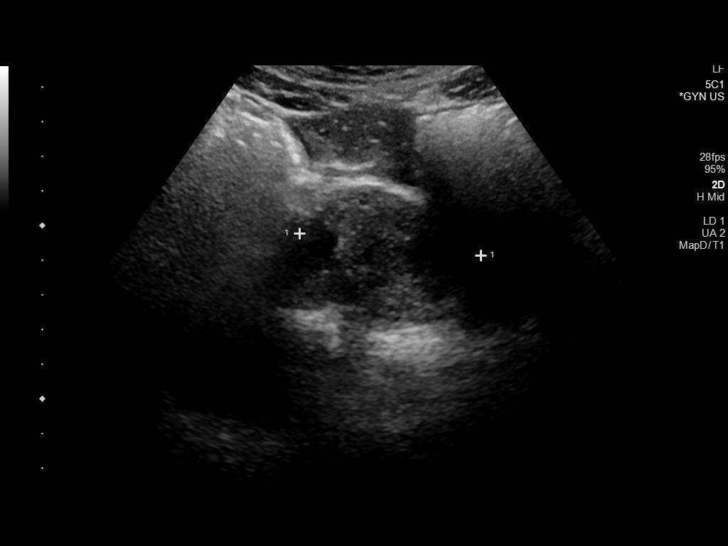
[im 21/84]
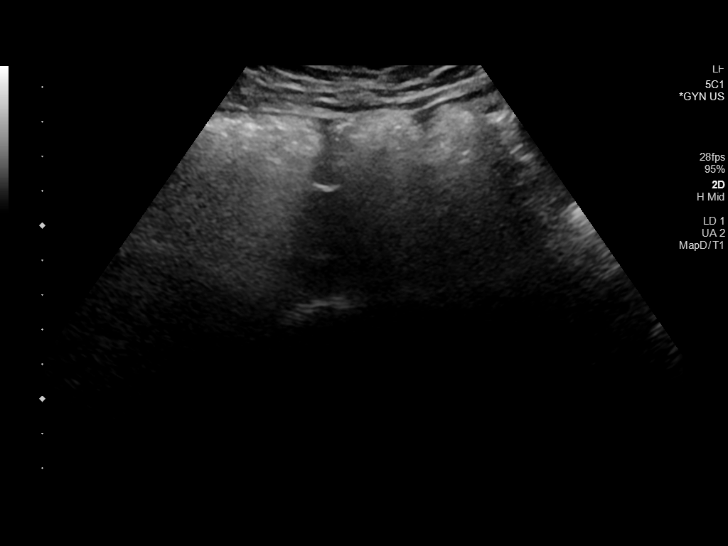
[im 28/84]
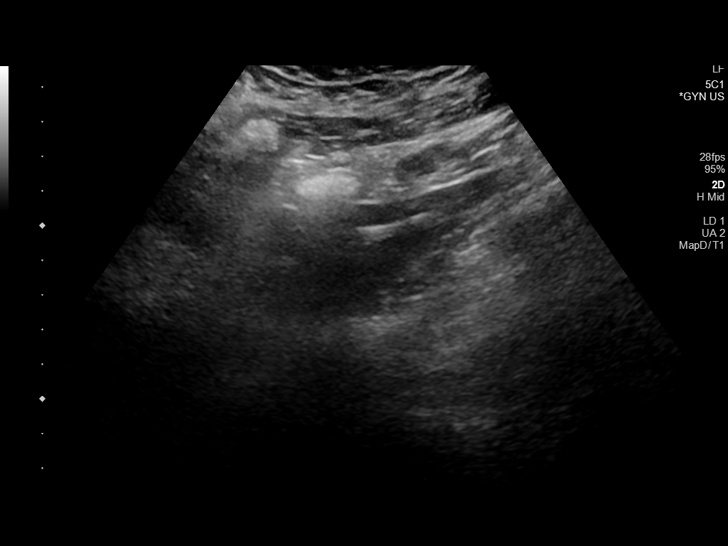
[im 32/84]
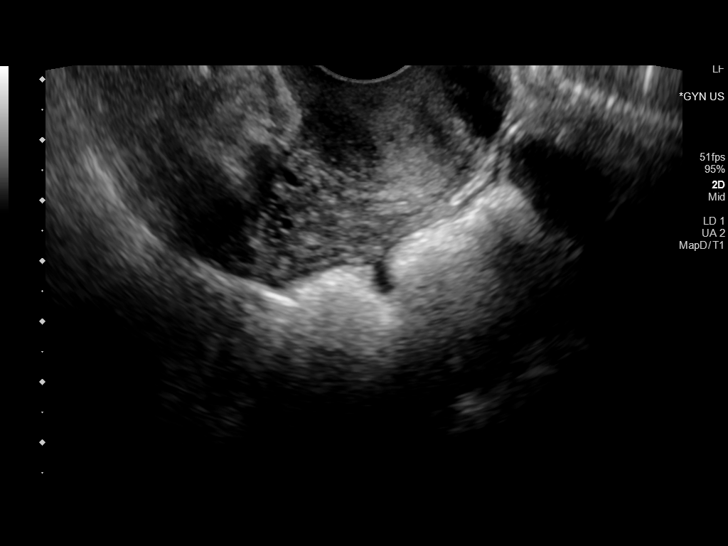
[im 39/84]
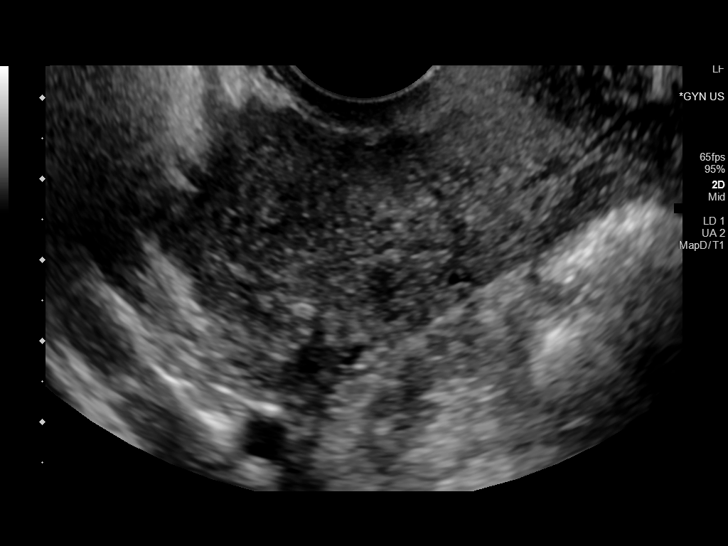
[im 45/84]
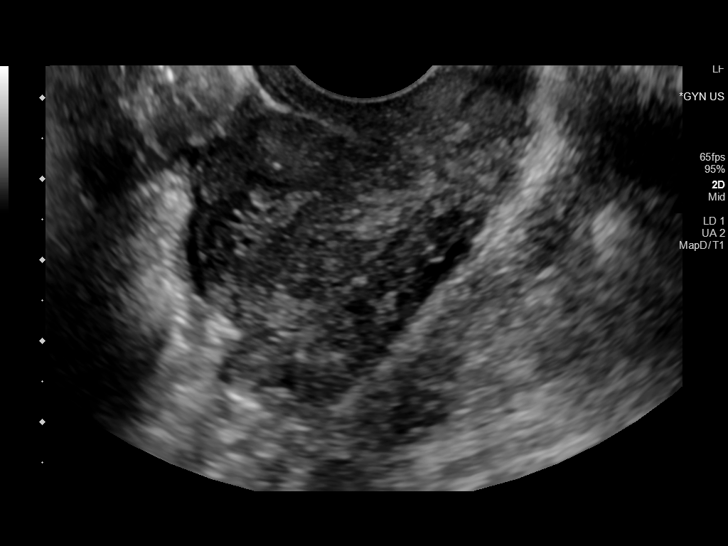
[im 52/84]
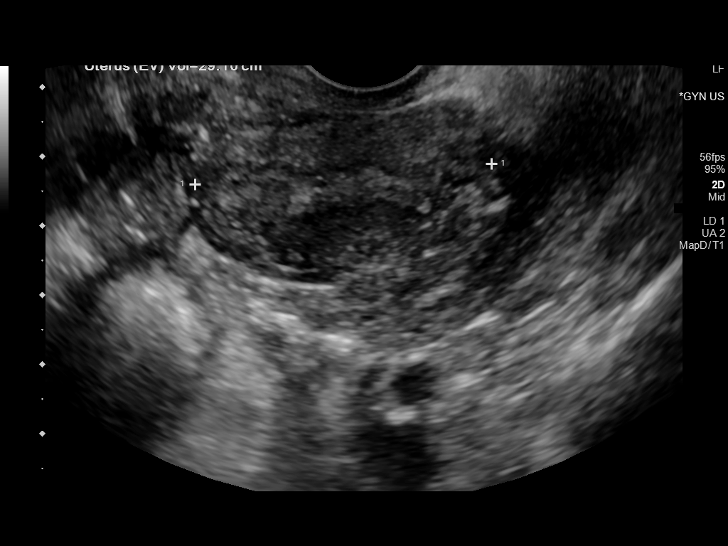
[im 56/84]
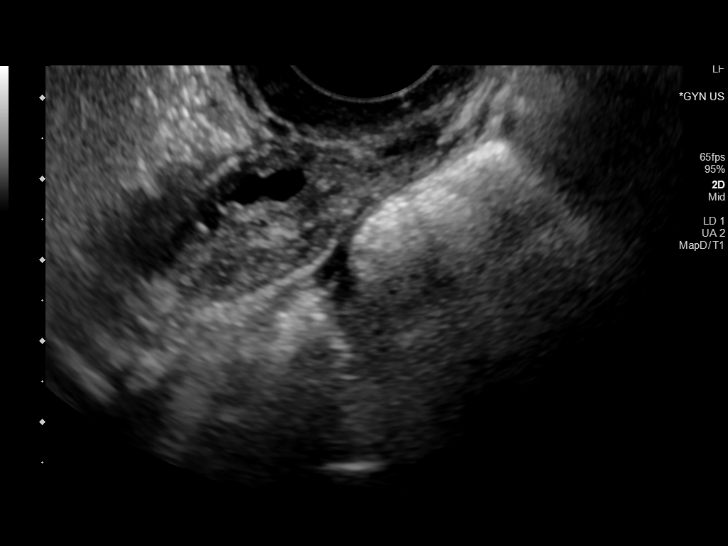
[im 63/84]
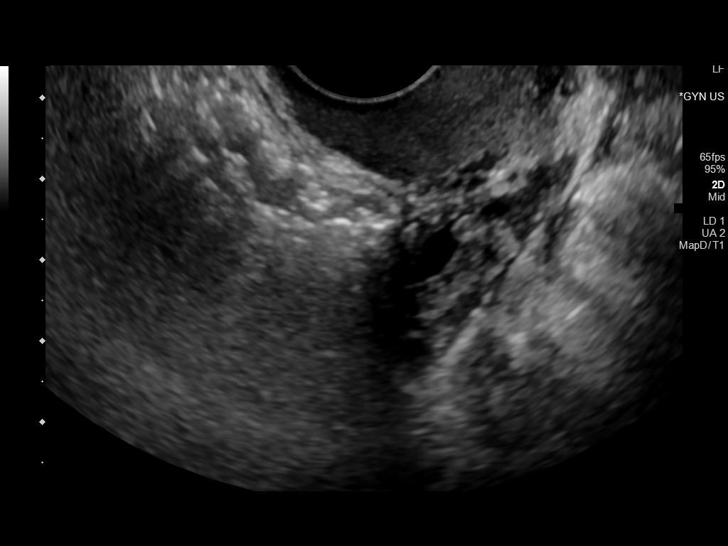
[im 70/84]
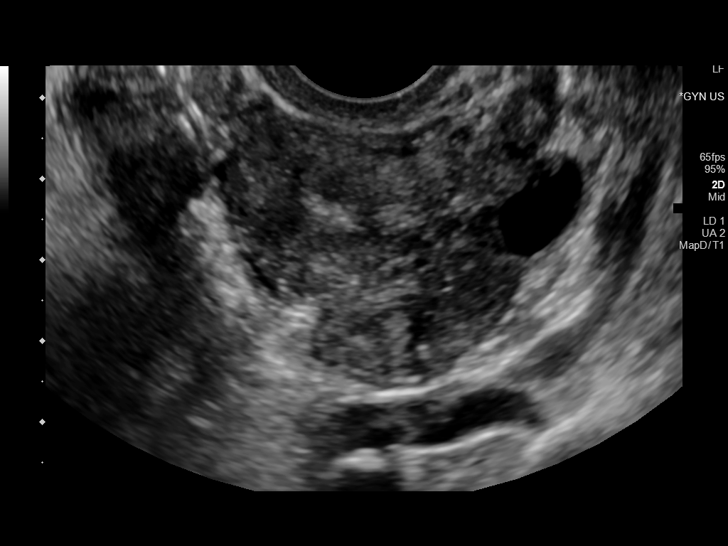
[im 77/84]
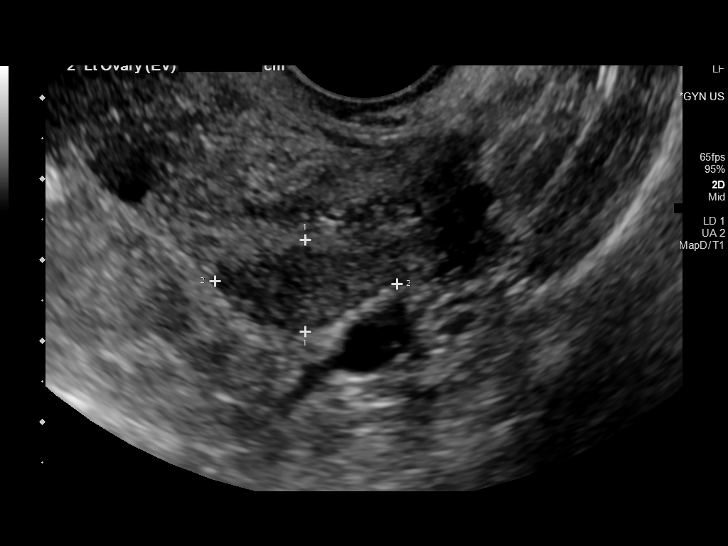
[im 84/84]
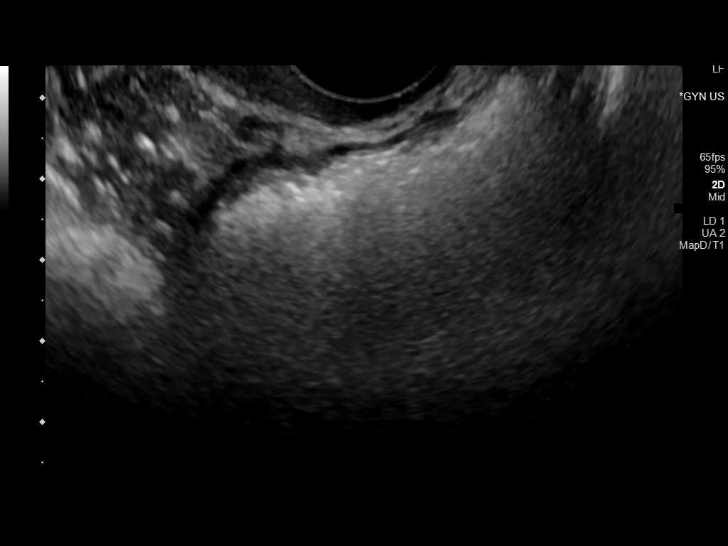

[14 of 25 positions shown; findings below may reference images not displayed]

FINDINGS: Uterus

Measurements: 5.1 x 2.6 x 4.3 cm = volume: 29.1 mL. No fibroids or
other mass visualized.

Endometrium

Thickness: 5.9 mm.  No focal abnormality visualized.

Right ovary

Measurements: 2.9 x 1.5 x 2 cm = volume: 4.5 mL. Normal
appearance/no adnexal mass.

Left ovary

Measurements: 2.3 x 1.1 x 2.3 cm = volume: 3.1 mL. Normal
appearance/no adnexal mass.

Other findings

Trace free fluid
IMPRESSION: Trace free fluid.  Otherwise negative pelvic ultrasound

## 2020-05-02 ENCOUNTER — Other Ambulatory Visit: Payer: Self-pay

## 2020-05-02 ENCOUNTER — Inpatient Hospital Stay: Payer: BC Managed Care – PPO | Attending: Oncology

## 2020-05-02 DIAGNOSIS — D0511 Intraductal carcinoma in situ of right breast: Secondary | ICD-10-CM | POA: Insufficient documentation

## 2020-05-02 LAB — CBC WITH DIFFERENTIAL/PLATELET
Abs Immature Granulocytes: 0.01 10*3/uL (ref 0.00–0.07)
Basophils Absolute: 0 10*3/uL (ref 0.0–0.1)
Basophils Relative: 1 %
Eosinophils Absolute: 0 10*3/uL (ref 0.0–0.5)
Eosinophils Relative: 1 %
HCT: 40.7 % (ref 36.0–46.0)
Hemoglobin: 13.4 g/dL (ref 12.0–15.0)
Immature Granulocytes: 0 %
Lymphocytes Relative: 42 %
Lymphs Abs: 1.6 10*3/uL (ref 0.7–4.0)
MCH: 27.3 pg (ref 26.0–34.0)
MCHC: 32.9 g/dL (ref 30.0–36.0)
MCV: 83.1 fL (ref 80.0–100.0)
Monocytes Absolute: 0.3 10*3/uL (ref 0.1–1.0)
Monocytes Relative: 7 %
Neutro Abs: 1.8 10*3/uL (ref 1.7–7.7)
Neutrophils Relative %: 49 %
Platelets: 235 10*3/uL (ref 150–400)
RBC: 4.9 MIL/uL (ref 3.87–5.11)
RDW: 12.4 % (ref 11.5–15.5)
WBC: 3.7 10*3/uL — ABNORMAL LOW (ref 4.0–10.5)
nRBC: 0 % (ref 0.0–0.2)

## 2020-05-02 LAB — SAVE SMEAR(SSMR), FOR PROVIDER SLIDE REVIEW

## 2020-05-03 ENCOUNTER — Other Ambulatory Visit: Payer: Self-pay | Admitting: Gynecologic Oncology

## 2020-05-03 DIAGNOSIS — C50911 Malignant neoplasm of unspecified site of right female breast: Secondary | ICD-10-CM

## 2020-05-03 DIAGNOSIS — Z1501 Genetic susceptibility to malignant neoplasm of breast: Secondary | ICD-10-CM

## 2020-05-03 MED ORDER — SENNOSIDES-DOCUSATE SODIUM 8.6-50 MG PO TABS: 2.0000 | ORAL_TABLET | Freq: Every day | ORAL | 0 refills | Status: DC

## 2020-05-03 MED ORDER — HYDROCODONE-ACETAMINOPHEN 5-325 MG PO TABS: 1.0000 | ORAL_TABLET | ORAL | 0 refills | Status: DC | PRN

## 2020-05-03 MED ORDER — IBUPROFEN 800 MG PO TABS: 800.0000 mg | ORAL_TABLET | Freq: Three times a day (TID) | ORAL | 0 refills | Status: DC | PRN

## 2020-05-03 NOTE — Progress Notes (Signed)
Post-op meds prescribed for after surgery on April 21.

## 2020-05-15 DIAGNOSIS — G4733 Obstructive sleep apnea (adult) (pediatric): Secondary | ICD-10-CM | POA: Diagnosis not present

## 2020-05-16 ENCOUNTER — Encounter: Payer: Self-pay | Admitting: Gynecologic Oncology

## 2020-05-17 ENCOUNTER — Encounter: Payer: BC Managed Care – PPO | Admitting: Gynecologic Oncology

## 2020-05-17 ENCOUNTER — Encounter: Payer: Self-pay | Admitting: Gynecologic Oncology

## 2020-05-17 NOTE — Progress Notes (Signed)
Spoke to patient and gave her updated surgical time, NPO status and scheduled Covid test for 05-22-20.  No changes to medical history per patient.  Patient stated understanding.

## 2020-05-18 DIAGNOSIS — C50911 Malignant neoplasm of unspecified site of right female breast: Secondary | ICD-10-CM

## 2020-05-19 ENCOUNTER — Encounter: Payer: Self-pay | Admitting: Oncology

## 2020-05-22 ENCOUNTER — Other Ambulatory Visit: Payer: Self-pay

## 2020-05-22 ENCOUNTER — Telehealth: Payer: Self-pay

## 2020-05-22 ENCOUNTER — Other Ambulatory Visit (HOSPITAL_COMMUNITY)
Admission: RE | Admit: 2020-05-22 | Discharge: 2020-05-22 | Disposition: A | Discharge status: Home / Self Care | Payer: BC Managed Care – PPO | Source: Ambulatory Visit | Attending: Gynecologic Oncology | Admitting: Gynecologic Oncology

## 2020-05-22 DIAGNOSIS — Z01812 Encounter for preprocedural laboratory examination: Secondary | ICD-10-CM | POA: Insufficient documentation

## 2020-05-22 DIAGNOSIS — Z1502 Genetic susceptibility to malignant neoplasm of ovary: Secondary | ICD-10-CM | POA: Diagnosis not present

## 2020-05-22 DIAGNOSIS — N801 Endometriosis of ovary: Secondary | ICD-10-CM | POA: Diagnosis not present

## 2020-05-22 DIAGNOSIS — Z1501 Genetic susceptibility to malignant neoplasm of breast: Secondary | ICD-10-CM | POA: Diagnosis not present

## 2020-05-22 DIAGNOSIS — Z8041 Family history of malignant neoplasm of ovary: Secondary | ICD-10-CM | POA: Diagnosis not present

## 2020-05-22 DIAGNOSIS — Z20822 Contact with and (suspected) exposure to covid-19: Secondary | ICD-10-CM | POA: Insufficient documentation

## 2020-05-22 DIAGNOSIS — Z853 Personal history of malignant neoplasm of breast: Secondary | ICD-10-CM | POA: Diagnosis not present

## 2020-05-22 DIAGNOSIS — Z803 Family history of malignant neoplasm of breast: Secondary | ICD-10-CM | POA: Diagnosis not present

## 2020-05-22 DIAGNOSIS — Z4002 Encounter for prophylactic removal of ovary: Secondary | ICD-10-CM | POA: Diagnosis not present

## 2020-05-22 DIAGNOSIS — D270 Benign neoplasm of right ovary: Secondary | ICD-10-CM | POA: Diagnosis not present

## 2020-05-22 DIAGNOSIS — N838 Other noninflammatory disorders of ovary, fallopian tube and broad ligament: Secondary | ICD-10-CM | POA: Diagnosis not present

## 2020-05-22 MED ORDER — METFORMIN HCL 500 MG PO TABS: 500.0000 mg | ORAL_TABLET | Freq: Every day | ORAL | 1 refills | Status: DC

## 2020-05-22 NOTE — Telephone Encounter (Signed)
Ms Morgan states that she understands her written pre-op instructions and has no questions or concerns at this time.Marland Kitchen

## 2020-05-23 ENCOUNTER — Encounter (HOSPITAL_COMMUNITY)
Admission: RE | Disposition: A | Discharge status: Home / Self Care | Payer: Self-pay | Source: Home / Self Care | Attending: Gynecologic Oncology

## 2020-05-23 ENCOUNTER — Ambulatory Visit (HOSPITAL_COMMUNITY)
Admission: RE | Admit: 2020-05-23 | Discharge: 2020-05-23 | Disposition: A | Discharge status: Home / Self Care | Payer: BC Managed Care – PPO | Attending: Gynecologic Oncology | Admitting: Gynecologic Oncology

## 2020-05-23 ENCOUNTER — Encounter (HOSPITAL_COMMUNITY): Payer: Self-pay | Admitting: Gynecologic Oncology

## 2020-05-23 ENCOUNTER — Ambulatory Visit (HOSPITAL_COMMUNITY): Payer: BC Managed Care – PPO | Admitting: Certified Registered"

## 2020-05-23 DIAGNOSIS — Z8041 Family history of malignant neoplasm of ovary: Secondary | ICD-10-CM | POA: Diagnosis not present

## 2020-05-23 DIAGNOSIS — N801 Endometriosis of ovary: Secondary | ICD-10-CM | POA: Diagnosis not present

## 2020-05-23 DIAGNOSIS — D270 Benign neoplasm of right ovary: Secondary | ICD-10-CM | POA: Insufficient documentation

## 2020-05-23 DIAGNOSIS — Z1501 Genetic susceptibility to malignant neoplasm of breast: Secondary | ICD-10-CM | POA: Diagnosis not present

## 2020-05-23 DIAGNOSIS — N802 Endometriosis of fallopian tube: Secondary | ICD-10-CM | POA: Diagnosis not present

## 2020-05-23 DIAGNOSIS — N736 Female pelvic peritoneal adhesions (postinfective): Secondary | ICD-10-CM | POA: Diagnosis not present

## 2020-05-23 DIAGNOSIS — N809 Endometriosis, unspecified: Secondary | ICD-10-CM | POA: Diagnosis not present

## 2020-05-23 DIAGNOSIS — Z20822 Contact with and (suspected) exposure to covid-19: Secondary | ICD-10-CM | POA: Diagnosis not present

## 2020-05-23 DIAGNOSIS — Z7984 Long term (current) use of oral hypoglycemic drugs: Secondary | ICD-10-CM | POA: Diagnosis not present

## 2020-05-23 DIAGNOSIS — Z853 Personal history of malignant neoplasm of breast: Secondary | ICD-10-CM | POA: Diagnosis not present

## 2020-05-23 DIAGNOSIS — N838 Other noninflammatory disorders of ovary, fallopian tube and broad ligament: Secondary | ICD-10-CM | POA: Insufficient documentation

## 2020-05-23 DIAGNOSIS — E119 Type 2 diabetes mellitus without complications: Secondary | ICD-10-CM | POA: Diagnosis not present

## 2020-05-23 DIAGNOSIS — Z1502 Genetic susceptibility to malignant neoplasm of ovary: Secondary | ICD-10-CM | POA: Diagnosis not present

## 2020-05-23 DIAGNOSIS — Z803 Family history of malignant neoplasm of breast: Secondary | ICD-10-CM | POA: Diagnosis not present

## 2020-05-23 DIAGNOSIS — C50911 Malignant neoplasm of unspecified site of right female breast: Secondary | ICD-10-CM

## 2020-05-23 DIAGNOSIS — Z4002 Encounter for prophylactic removal of ovary: Secondary | ICD-10-CM

## 2020-05-23 HISTORY — PX: ROBOTIC ASSISTED SALPINGO OOPHERECTOMY: SHX6082

## 2020-05-23 LAB — CBC WITH DIFFERENTIAL/PLATELET
Abs Immature Granulocytes: 0 10*3/uL (ref 0.00–0.07)
Basophils Absolute: 0 10*3/uL (ref 0.0–0.1)
Basophils Relative: 1 %
Eosinophils Absolute: 0.1 10*3/uL (ref 0.0–0.5)
Eosinophils Relative: 2 %
HCT: 37.6 % (ref 36.0–46.0)
Hemoglobin: 12.4 g/dL (ref 12.0–15.0)
Immature Granulocytes: 0 %
Lymphocytes Relative: 41 %
Lymphs Abs: 1.5 10*3/uL (ref 0.7–4.0)
MCH: 28.1 pg (ref 26.0–34.0)
MCHC: 33 g/dL (ref 30.0–36.0)
MCV: 85.3 fL (ref 80.0–100.0)
Monocytes Absolute: 0.3 10*3/uL (ref 0.1–1.0)
Monocytes Relative: 9 %
Neutro Abs: 1.7 10*3/uL (ref 1.7–7.7)
Neutrophils Relative %: 47 %
Platelets: 229 10*3/uL (ref 150–400)
RBC: 4.41 MIL/uL (ref 3.87–5.11)
RDW: 12.8 % (ref 11.5–15.5)
WBC: 3.6 10*3/uL — ABNORMAL LOW (ref 4.0–10.5)
nRBC: 0 % (ref 0.0–0.2)

## 2020-05-23 LAB — BASIC METABOLIC PANEL
Anion gap: 8 (ref 5–15)
BUN: 17 mg/dL (ref 6–20)
CO2: 21 mmol/L — ABNORMAL LOW (ref 22–32)
Calcium: 8.8 mg/dL — ABNORMAL LOW (ref 8.9–10.3)
Chloride: 110 mmol/L (ref 98–111)
Creatinine, Ser: 0.72 mg/dL (ref 0.44–1.00)
GFR, Estimated: >60 mL/min (ref >60)
Glucose, Bld: 108 mg/dL — ABNORMAL HIGH (ref 70–99)
Potassium: 3.4 mmol/L — ABNORMAL LOW (ref 3.5–5.1)
Sodium: 139 mmol/L (ref 135–145)

## 2020-05-23 LAB — TYPE AND SCREEN
ABO/RH(D): A POS
Antibody Screen: NEGATIVE

## 2020-05-23 LAB — HEMOGLOBIN A1C
Hgb A1c MFr Bld: 6.4 % — ABNORMAL HIGH (ref 4.8–5.6)
Mean Plasma Glucose: 136.98 mg/dL

## 2020-05-23 LAB — GLUCOSE, CAPILLARY
Glucose-Capillary: 105 mg/dL — ABNORMAL HIGH (ref 70–99)
Glucose-Capillary: 152 mg/dL — ABNORMAL HIGH (ref 70–99)

## 2020-05-23 LAB — SARS CORONAVIRUS 2 (TAT 6-24 HRS): SARS Coronavirus 2: NEGATIVE

## 2020-05-23 SURGERY — SALPINGO-OOPHORECTOMY, ROBOT-ASSISTED
Anesthesia: General | Laterality: Bilateral

## 2020-05-23 MED ORDER — LACTATED RINGERS IV SOLN: INTRAVENOUS | Status: DC

## 2020-05-23 MED ORDER — CELECOXIB 200 MG PO CAPS
400.0000 mg | ORAL_CAPSULE | ORAL | Status: AC
  Administered 2020-05-23: 400 mg via ORAL
  Filled 2020-05-23: qty 2

## 2020-05-23 MED ORDER — SUGAMMADEX SODIUM 200 MG/2ML IV SOLN
INTRAVENOUS | Status: DC | PRN
  Administered 2020-05-23: 160 mg via INTRAVENOUS
  Administered 2020-05-23: 40 mg via INTRAVENOUS

## 2020-05-23 MED ORDER — SODIUM CHLORIDE 0.9% FLUSH
3.0000 mL | Freq: Two times a day (BID) | INTRAVENOUS | Status: DC
Start: 2020-05-23 — End: 2020-05-23

## 2020-05-23 MED ORDER — OXYCODONE HCL 5 MG PO TABS: 5.0000 mg | ORAL_TABLET | Freq: Once | ORAL | Status: DC | PRN

## 2020-05-23 MED ORDER — OXYCODONE HCL 5 MG/5ML PO SOLN: 5.0000 mg | Freq: Once | ORAL | Status: DC | PRN

## 2020-05-23 MED ORDER — KETOROLAC TROMETHAMINE 30 MG/ML IJ SOLN
30.0000 mg | Freq: Once | INTRAMUSCULAR | Status: AC | PRN
  Administered 2020-05-23: 30 mg via INTRAVENOUS

## 2020-05-23 MED ORDER — MIDAZOLAM HCL 2 MG/2ML IJ SOLN
INTRAMUSCULAR | Status: DC | PRN
  Administered 2020-05-23: 2 mg via INTRAVENOUS

## 2020-05-23 MED ORDER — MIDAZOLAM HCL 2 MG/2ML IJ SOLN
INTRAMUSCULAR | Status: AC
  Filled 2020-05-23: qty 2

## 2020-05-23 MED ORDER — BUPIVACAINE HCL 0.25 % IJ SOLN
INTRAMUSCULAR | Status: AC
  Filled 2020-05-23: qty 1

## 2020-05-23 MED ORDER — ROCURONIUM BROMIDE 10 MG/ML (PF) SYRINGE
PREFILLED_SYRINGE | INTRAVENOUS | Status: AC
  Filled 2020-05-23: qty 10

## 2020-05-23 MED ORDER — LIDOCAINE 2% (20 MG/ML) 5 ML SYRINGE
INTRAMUSCULAR | Status: AC
  Filled 2020-05-23: qty 5

## 2020-05-23 MED ORDER — AMISULPRIDE (ANTIEMETIC) 5 MG/2ML IV SOLN
INTRAVENOUS | Status: AC
  Filled 2020-05-23: qty 2

## 2020-05-23 MED ORDER — ROCURONIUM BROMIDE 10 MG/ML (PF) SYRINGE
PREFILLED_SYRINGE | INTRAVENOUS | Status: DC | PRN
  Administered 2020-05-23 (×2): 20 mg via INTRAVENOUS
  Administered 2020-05-23: 60 mg via INTRAVENOUS

## 2020-05-23 MED ORDER — BUPIVACAINE HCL 0.25 % IJ SOLN
INTRAMUSCULAR | Status: DC | PRN
  Administered 2020-05-23: 29 mL

## 2020-05-23 MED ORDER — PROPOFOL 10 MG/ML IV BOLUS
INTRAVENOUS | Status: AC
  Filled 2020-05-23: qty 40

## 2020-05-23 MED ORDER — FENTANYL CITRATE (PF) 100 MCG/2ML IJ SOLN
25.0000 ug | INTRAMUSCULAR | Status: DC | PRN
  Administered 2020-05-23: 50 ug via INTRAVENOUS

## 2020-05-23 MED ORDER — GLYCOPYRROLATE PF 0.2 MG/ML IJ SOSY
PREFILLED_SYRINGE | INTRAMUSCULAR | Status: DC | PRN
  Administered 2020-05-23: .2 mg via INTRAVENOUS

## 2020-05-23 MED ORDER — FENTANYL CITRATE (PF) 250 MCG/5ML IJ SOLN
INTRAMUSCULAR | Status: DC | PRN
  Administered 2020-05-23: 100 ug via INTRAVENOUS
  Administered 2020-05-23 (×2): 50 ug via INTRAVENOUS

## 2020-05-23 MED ORDER — PROPOFOL 10 MG/ML IV BOLUS
INTRAVENOUS | Status: DC | PRN
  Administered 2020-05-23: 200 mg via INTRAVENOUS

## 2020-05-23 MED ORDER — FENTANYL CITRATE (PF) 250 MCG/5ML IJ SOLN
INTRAMUSCULAR | Status: AC
  Filled 2020-05-23: qty 5

## 2020-05-23 MED ORDER — KETAMINE HCL 10 MG/ML IJ SOLN
INTRAMUSCULAR | Status: DC | PRN
  Administered 2020-05-23: 30 mg via INTRAVENOUS

## 2020-05-23 MED ORDER — PHENYLEPHRINE HCL-NACL 20-0.9 MG/250ML-% IV SOLN
INTRAVENOUS | Status: DC | PRN
  Administered 2020-05-23: 50 ug/min via INTRAVENOUS

## 2020-05-23 MED ORDER — ORAL CARE MOUTH RINSE: 15.0000 mL | Freq: Once | OROMUCOSAL | Status: AC

## 2020-05-23 MED ORDER — KETAMINE HCL 10 MG/ML IJ SOLN
INTRAMUSCULAR | Status: AC
  Filled 2020-05-23: qty 1

## 2020-05-23 MED ORDER — STERILE WATER FOR IRRIGATION IR SOLN
Status: DC | PRN
  Administered 2020-05-23: 1000 mL

## 2020-05-23 MED ORDER — EPHEDRINE SULFATE-NACL 50-0.9 MG/10ML-% IV SOSY
PREFILLED_SYRINGE | INTRAVENOUS | Status: DC | PRN
  Administered 2020-05-23 (×2): 10 mg via INTRAVENOUS

## 2020-05-23 MED ORDER — LACTATED RINGERS IR SOLN
Status: DC | PRN
  Administered 2020-05-23: 1000 mL

## 2020-05-23 MED ORDER — CHLORHEXIDINE GLUCONATE 0.12 % MT SOLN
15.0000 mL | Freq: Once | OROMUCOSAL | Status: AC
  Administered 2020-05-23: 15 mL via OROMUCOSAL

## 2020-05-23 MED ORDER — PROMETHAZINE HCL 25 MG/ML IJ SOLN: 6.2500 mg | INTRAMUSCULAR | Status: DC | PRN

## 2020-05-23 MED ORDER — LIDOCAINE 20MG/ML (2%) 15 ML SYRINGE OPTIME
INTRAMUSCULAR | Status: DC | PRN
  Administered 2020-05-23: 1.5 mg/kg/h via INTRAVENOUS

## 2020-05-23 MED ORDER — DEXAMETHASONE SODIUM PHOSPHATE 10 MG/ML IJ SOLN
INTRAMUSCULAR | Status: DC | PRN
  Administered 2020-05-23: 8 mg via INTRAVENOUS

## 2020-05-23 MED ORDER — HEPARIN SODIUM (PORCINE) 5000 UNIT/ML IJ SOLN
5000.0000 [IU] | INTRAMUSCULAR | Status: AC
  Administered 2020-05-23: 5000 [IU] via SUBCUTANEOUS
  Filled 2020-05-23: qty 1

## 2020-05-23 MED ORDER — GABAPENTIN 300 MG PO CAPS
300.0000 mg | ORAL_CAPSULE | ORAL | Status: AC
  Administered 2020-05-23: 300 mg via ORAL
  Filled 2020-05-23: qty 1

## 2020-05-23 MED ORDER — SCOPOLAMINE 1 MG/3DAYS TD PT72
1.0000 | MEDICATED_PATCH | TRANSDERMAL | Status: DC
  Administered 2020-05-23: 1.5 mg via TRANSDERMAL
  Filled 2020-05-23: qty 1

## 2020-05-23 MED ORDER — KETOROLAC TROMETHAMINE 30 MG/ML IJ SOLN
INTRAMUSCULAR | Status: AC
  Filled 2020-05-23: qty 1

## 2020-05-23 MED ORDER — FENTANYL CITRATE (PF) 100 MCG/2ML IJ SOLN
INTRAMUSCULAR | Status: AC
  Filled 2020-05-23: qty 2

## 2020-05-23 MED ORDER — ONDANSETRON HCL 4 MG/2ML IJ SOLN
INTRAMUSCULAR | Status: AC
  Filled 2020-05-23: qty 2

## 2020-05-23 MED ORDER — ACETAMINOPHEN 500 MG PO TABS
1000.0000 mg | ORAL_TABLET | ORAL | Status: AC
  Administered 2020-05-23: 1000 mg via ORAL
  Filled 2020-05-23: qty 2

## 2020-05-23 MED ORDER — AMISULPRIDE (ANTIEMETIC) 5 MG/2ML IV SOLN
10.0000 mg | Freq: Once | INTRAVENOUS | Status: AC | PRN
  Administered 2020-05-23: 10 mg via INTRAVENOUS

## 2020-05-23 MED ORDER — EPHEDRINE 5 MG/ML INJ
INTRAVENOUS | Status: AC
  Filled 2020-05-23: qty 10

## 2020-05-23 MED ORDER — LIDOCAINE 2% (20 MG/ML) 5 ML SYRINGE
INTRAMUSCULAR | Status: DC | PRN
  Administered 2020-05-23: 60 mg via INTRAVENOUS

## 2020-05-23 MED ORDER — ONDANSETRON HCL 4 MG/2ML IJ SOLN
INTRAMUSCULAR | Status: DC | PRN
  Administered 2020-05-23: 4 mg via INTRAVENOUS

## 2020-05-23 MED ORDER — DEXAMETHASONE SODIUM PHOSPHATE 10 MG/ML IJ SOLN
INTRAMUSCULAR | Status: AC
  Filled 2020-05-23: qty 1

## 2020-05-23 SURGICAL SUPPLY — 65 items
ADH SKN CLS APL DERMABOND .7 (GAUZE/BANDAGES/DRESSINGS) ×1
AGENT HMST KT MTR STRL THRMB (HEMOSTASIS) ×1
APL ESCP 34 STRL LF DISP (HEMOSTASIS) ×1
APPLICATOR SURGIFLO ENDO (HEMOSTASIS) ×1 IMPLANT
BACTOSHIELD CHG 4% 4OZ (MISCELLANEOUS) ×1
BAG LAPAROSCOPIC 12 15 PORT 16 (BASKET) IMPLANT
BAG RETRIEVAL 12/15 (BASKET)
BAG SPEC RTRVL LRG 6X4 10 (ENDOMECHANICALS) ×2
BLADE SURG SZ10 CARB STEEL (BLADE) IMPLANT
COVER BACK TABLE 60X90IN (DRAPES) ×2 IMPLANT
COVER TIP SHEARS 8 DVNC (MISCELLANEOUS) ×1 IMPLANT
COVER TIP SHEARS 8MM DA VINCI (MISCELLANEOUS) ×2
COVER WAND RF STERILE (DRAPES) IMPLANT
DECANTER SPIKE VIAL GLASS SM (MISCELLANEOUS) ×1 IMPLANT
DERMABOND ADVANCED (GAUZE/BANDAGES/DRESSINGS) ×1
DERMABOND ADVANCED .7 DNX12 (GAUZE/BANDAGES/DRESSINGS) ×1 IMPLANT
DRAPE ARM DVNC X/XI (DISPOSABLE) ×4 IMPLANT
DRAPE COLUMN DVNC XI (DISPOSABLE) ×1 IMPLANT
DRAPE DA VINCI XI ARM (DISPOSABLE) ×8
DRAPE DA VINCI XI COLUMN (DISPOSABLE) ×2
DRAPE SHEET LG 3/4 BI-LAMINATE (DRAPES) ×2 IMPLANT
DRAPE SURG IRRIG POUCH 19X23 (DRAPES) ×2 IMPLANT
DRSG OPSITE POSTOP 4X6 (GAUZE/BANDAGES/DRESSINGS) IMPLANT
DRSG OPSITE POSTOP 4X8 (GAUZE/BANDAGES/DRESSINGS) IMPLANT
ELECT PENCIL ROCKER SW 15FT (MISCELLANEOUS) IMPLANT
ELECT REM PT RETURN 15FT ADLT (MISCELLANEOUS) ×2 IMPLANT
GLOVE SURG ENC MOIS LTX SZ6 (GLOVE) ×8 IMPLANT
GLOVE SURG ENC MOIS LTX SZ6.5 (GLOVE) ×4 IMPLANT
GOWN STRL REUS W/ TWL LRG LVL3 (GOWN DISPOSABLE) ×4 IMPLANT
GOWN STRL REUS W/TWL LRG LVL3 (GOWN DISPOSABLE) ×8
HOLDER FOLEY CATH W/STRAP (MISCELLANEOUS) ×1 IMPLANT
IRRIG SUCT STRYKERFLOW 2 WTIP (MISCELLANEOUS) ×2
IRRIGATION SUCT STRKRFLW 2 WTP (MISCELLANEOUS) ×1 IMPLANT
KIT TURNOVER KIT A (KITS) ×2 IMPLANT
MANIPULATOR UTERINE 4.5 ZUMI (MISCELLANEOUS) ×2 IMPLANT
NDL HYPO 21X1.5 SAFETY (NEEDLE) ×1 IMPLANT
NEEDLE HYPO 21X1.5 SAFETY (NEEDLE) ×2 IMPLANT
OBTURATOR OPTICAL STANDARD 8MM (TROCAR) ×2
OBTURATOR OPTICAL STND 8 DVNC (TROCAR) ×1
OBTURATOR OPTICALSTD 8 DVNC (TROCAR) ×1 IMPLANT
PACK ROBOT GYN CUSTOM WL (TRAY / TRAY PROCEDURE) ×2 IMPLANT
PAD POSITIONING PINK XL (MISCELLANEOUS) ×2 IMPLANT
PORT ACCESS TROCAR AIRSEAL 12 (TROCAR) ×1 IMPLANT
PORT ACCESS TROCAR AIRSEAL 5M (TROCAR) ×1
POUCH SPECIMEN RETRIEVAL 10MM (ENDOMECHANICALS) ×2 IMPLANT
SCRUB CHG 4% DYNA-HEX 4OZ (MISCELLANEOUS) ×1 IMPLANT
SEAL CANN UNIV 5-8 DVNC XI (MISCELLANEOUS) ×4 IMPLANT
SEAL XI 5MM-8MM UNIVERSAL (MISCELLANEOUS) ×8
SET TRI-LUMEN FLTR TB AIRSEAL (TUBING) ×2 IMPLANT
SPONGE LAP 18X18 RF (DISPOSABLE) IMPLANT
SURGIFLO W/THROMBIN 8M KIT (HEMOSTASIS) ×1 IMPLANT
SUT MNCRL AB 4-0 PS2 18 (SUTURE) IMPLANT
SUT PDS AB 1 TP1 96 (SUTURE) IMPLANT
SUT VIC AB 0 CT1 27 (SUTURE)
SUT VIC AB 0 CT1 27XBRD ANTBC (SUTURE) IMPLANT
SUT VIC AB 2-0 CT1 27 (SUTURE)
SUT VIC AB 2-0 CT1 TAPERPNT 27 (SUTURE) IMPLANT
SUT VIC AB 4-0 PS2 18 (SUTURE) ×4 IMPLANT
TOWEL OR NON WOVEN STRL DISP B (DISPOSABLE) ×2 IMPLANT
TRAP SPECIMEN MUCUS 40CC (MISCELLANEOUS) ×1 IMPLANT
TRAY FOLEY MTR SLVR 16FR STAT (SET/KITS/TRAYS/PACK) ×2 IMPLANT
TROCAR XCEL NON-BLD 5MMX100MML (ENDOMECHANICALS) IMPLANT
UNDERPAD 30X36 HEAVY ABSORB (UNDERPADS AND DIAPERS) ×2 IMPLANT
WATER STERILE IRR 1000ML POUR (IV SOLUTION) ×2 IMPLANT
YANKAUER SUCT BULB TIP 10FT TU (MISCELLANEOUS) IMPLANT

## 2020-05-23 NOTE — H&P (Signed)
GYNECOLOGIC ONCOLOGY HISTORY AND PHYSICAL  Assessment/Plan:  Personal history of breast cancer with BRCA2 mutation.  Plan for risk-reducing BSO. We have had multiple conversations today in terms of concurrent hysterectomy. Discussed with Zeriah again this morning. She is comfortable proceeding as we discussed last time - plan will be for BSO only unless malignancy found during surgery. She will discuss with her medical oncologist at f/u in May possible transition to Tamoxifen.   Jeral Pinch, MD  Gynecologic Oncology  ___________________________________________  Chief Complaint: No chief complaint on file.   History of Present Illness:  Vanessa Allen is a 55 y.o. y.o. female who was seen initially for discussion re risk-reducing surgery in the setting of a BRCA2 mutation.  The patient had diagnostic mammogram in June showing abnormality in the right breast. Diagnostic mammogram was performed followed by a biopsy on 7/28 which showed DCIS, concern for possible microinvasive disease. She is s/p lumpectomy on 8/26 (no invasive disease, negative margins, negative sentinel lymph nodes) and adj RT (completed 11/10). ER/PR positive.  She started tamoxifen approximately 2 weeks ago and denies any significant side effects since that time.  She had genetic testing on 8/12, which showed a pathogenic BRCA2 gene mutation.  She is recovering from wrist surgery.  She has a history of uterine fibroids and endometriosis.  In 2007, she underwent laparotomy with myomectomies, lysis of adhesion, coagulation of endometriosis, evacuation of the left endometrioma, right ureterolysis, and excision of a left flank lipoma.  Findings at the time of surgery included 2 uterine fibroids measuring 5 x 4 cm and 8 x 12 cm.  There were extensive pelvic adhesions with the posterior cul-de-sac obliterated.  Sigmoid colon, uterus, both tubes and ovaries were extensively involved with the tubes and ovaries initially not  recognizable.  The left ovary was replaced with a large endometrioma measuring 10 x 12 cm which was encased with the uterus and sigmoid colon.  About 3 weeks after the surgery, she began having epigastric pain as well as emesis and was ultimately diagnosed with cholecystitis.  She was taken to the operating room and underwent laparoscopic cholecystectomy, with adhesions noted between the gallbladder and liver and some to the anterior abdominal wall.  Her appendix was located somewhat to the left in her lower abdomen and adhered to the sigmoid colon.  It did not appear inflamed, but an appendectomy was done because of the abnormal positioning of the appendix.  At that time she was healing very well from her recent pelvic surgery with no significant adhesions noted.  She had a long history of painful heavy periods with the diagnosis of uterine fibroids and endometriosis.  Since her menses stopped at the age of 30, she denies any issues including pelvic pain.  She had been on hormonal suppression and missed several doses.  This was at the age of 18.  She did not restart her medication and her periods stopped suddenly.  She notes occasional hot flashes now.  Patient lives in [place] by herself.  She works as a Multimedia programmer.  She worked at [employer] from 2001-2005 and has been a Multimedia programmer since that time.  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  PAST MEDICAL HISTORY:  Past Medical History:  Diagnosis Date  . Arthritis    knees and back   . BRCA2 gene mutation positive in female   . Cancer [facility])    Right Breast Cancer  . Complication of anesthesia   . Diabetes mellitus without  complication ([place])    type 2   . Endometriosis   . Family history of adverse reaction to anesthesia    brother has issues with n/v after surgery   . Family history of BRCA gene mutation   . Family history of breast cancer   . Family history of leukemia   . Family history of ovarian cancer    . Family history of stomach cancer   . Fibroid uterus   . GERD (gastroesophageal reflux disease)   . Personal history of radiation therapy   . PONV (postoperative nausea and vomiting)   . Venous insufficiency of lower extremity      PAST SURGICAL HISTORY:  Past Surgical History:  Procedure Laterality Date  . APPENDECTOMY    . BREAST BIOPSY Right 08/25/2019  . BREAST LUMPECTOMY Right 09/23/2019  . BREAST LUMPECTOMY WITH RADIOACTIVE SEED AND SENTINEL LYMPH NODE BIOPSY Right 09/23/2019   Procedure: RIGHT BREAST LUMPECTOMY WITH RADIOACTIVE SEED AND SENTINEL LYMPH NODE MAPPING;  Surgeon: Erroll Luna, MD;  Location: [place];  Service: General;  Laterality: Right;  PEC BLOCK  . CHOLECYSTECTOMY     lsc chole cystectomy and appendectomy 1 month after myomectomy  . CYST EXCISION Left 12/13/2019   Procedure: Excision left dorsal wrist ganglion cyst;  Surgeon: Cindra Presume, MD;  Location: [place];  Service: Plastics;  Laterality: Left;  45 min  . ENDOVENOUS ABLATION SAPHENOUS VEIN W/ LASER Left 12/30/2019   endovenous laser ablation left greater saphenous vein by Gae Gallop MD   . Lipoma removal Left    Left Lower Back  . MYOMECTOMY    . WISDOM TOOTH EXTRACTION      OB/GYN HISTORY:  OB History  Gravida Para Term Preterm AB Living  0 0 0 0 0 0  SAB IAB Ectopic Multiple Live Births  0 0 0 0 0    No LMP recorded. Patient is postmenopausal.  ALLERGIES:  No Known Allergies   FAMILY HISTORY:  Family History  Problem Relation Age of Onset  . Hyperlipidemia Mother   . Hypertension Mother   . Varicose Veins Mother   . Diabetes Father   . Heart disease Father   . Stomach cancer Father 73  . Hypertension Brother   . Leukemia Paternal Aunt        dx. >50  . Breast cancer Niece 61  . BRCA 1/2 Niece   . Ovarian cancer Cousin        d. <50 (maternal first cousin)  . Ovarian cancer Cousin        d. <50 (maternal first cousin)  . Ovarian cancer Cousin         d. late 61s (maternal first cousin)  . Colon cancer Neg Hx   . Pancreatic cancer Neg Hx   . Prostate cancer Neg Hx      SOCIAL HISTORY:  Social Connections: Not on file    REVIEW OF SYSTEMS:  + Back pain Denies appetite changes, fevers, chills, fatigue, unexplained weight changes. Denies hearing loss, neck lumps or masses, mouth sores, ringing in ears or voice changes. Denies cough or wheezing.  Denies shortness of breath. Denies chest pain or palpitations. Denies leg swelling. Denies abdominal distention, pain, blood in stools, constipation, diarrhea, nausea, vomiting, or early satiety. Denies pain with intercourse, dysuria, frequency, hematuria or incontinence. Denies hot flashes, pelvic pain, vaginal bleedingor vaginal discharge.  Denies joint pain or muscle pain/cramps. Denies itching, rash, or wounds. Denies dizziness, headaches, numbness or seizures.  Denies swollen lymph nodes or glands, denies easy bruising or bleeding. Denies anxiety, depression, confusion, or decreased concentration.  Physical Exam:  Vital Signs for this encounter:  Blood pressure 127/87, pulse 80, temperature 98.2 F (36.8 C), temperature source Oral, resp. rate 16, height '5\' 6"'  (1.676 m), weight 179 lb (81.2 kg), SpO2 98 %. Body mass index is 28.89 kg/m. General: Alert, oriented, no acute distress.  HEENT: Normocephalic, atraumatic. Sclera anicteric.  Chest: Unlabored breathing on room air. Abdomen: Normoactive bowel sounds. Soft, nondistended, nontender to palpation. No masses or hepatosplenomegaly appreciated. No palpable fluid wave.  Well-healed laparoscopic and Pfannenstiel incisions. Extremities: Grossly normal range of motion. Warm, well perfused. No edema bilaterally.   LABORATORY AND RADIOLOGIC DATA:   Lab Results  Component Value Date   WBC 3.6 (L) 05/23/2020   HGB 12.4 05/23/2020   HCT 37.6 05/23/2020   PLT 229 05/23/2020   GLUCOSE 108 (H) 05/23/2020   CHOL 166 07/26/2019   TRIG  150 (H) 07/26/2019   HDL 42 07/26/2019   LDLCALC 98 07/26/2019   ALT 31 03/14/2020   AST 22 03/14/2020   NA 139 05/23/2020   K 3.4 (L) 05/23/2020   CL 110 05/23/2020   CREATININE 0.72 05/23/2020   BUN 17 05/23/2020   CO2 21 (L) 05/23/2020   TSH 1.050 04/02/2018   HGBA1C 6.4 (H) 02/28/2020   MICROALBUR 10 10/26/2019

## 2020-05-23 NOTE — Progress Notes (Signed)
Was able to reach patient's niece, Stanton Kidney, to let her know surgery over and went well. She will be picking up the patient when ready for dc from the PACU.  Jeral Pinch MD Gynecologic Oncology

## 2020-05-23 NOTE — Discharge Instructions (Signed)
05/23/2020  Return to work: 2-6 weeks (can return part-time or working from home after 2 weeks but still with activity restrictions until 4-6 weeks)  Activity: 1. Be up and out of the bed during the day.  Take a nap if needed.  You may walk up steps but be careful and use the hand rail.  Stair climbing will tire you more than you think, you may need to stop part way and rest.   2. No lifting or straining for 6 weeks.  3. No driving until you are off narcotics and can brake safely. For most people, this is 1-2 weeks.   4. Shower daily.  Use soap and water on your incision and pat dry; don't rub.   5. No sexual activity and nothing in the vagina for 4-6 weeks.  Medications:  - Take ibuprofen and tylenol first line for pain control. Take these regularly (every 6 hours) to decrease the build up of pain.  - If necessary, for severe pain not relieved by ibuprofen, take oxycodone.  - While taking oxycodone you should take sennakot every night to reduce the likelihood of constipation. If this causes diarrhea, stop its use.  Diet: 1. Low sodium Heart Healthy Diet is recommended.  2. It is safe to use a laxative if you have difficulty moving your bowels.   Wound Care: 1. Keep clean and dry.  Shower daily.  Reasons to call the Doctor:   Fever - Oral temperature greater than 100.4 degrees Fahrenheit  Foul-smelling vaginal discharge  Difficulty urinating  Nausea and vomiting  Increased pain at the site of the incision that is unrelieved with pain medicine.  Difficulty breathing with or without chest pain  New calf pain especially if only on one side  Sudden, continuing increased vaginal bleeding with or without clots.   Follow-up: 1. See Jeral Pinch in 3 weeks. Your visit will be visible on my chart.  Contacts: For questions or concerns you should contact:  Dr. Jeral Pinch at [phone] After hours and on week-ends call [phone] and ask to speak to the  physician on call for Gynecologic Oncology

## 2020-05-23 NOTE — Anesthesia Procedure Notes (Signed)
Procedure Name: Intubation Date/Time: 05/23/2020 7:54 AM Performed by: Pilar Grammes, CRNA Pre-anesthesia Checklist: Patient identified, Emergency Drugs available, Suction available, Patient being monitored and Timeout performed Patient Re-evaluated:Patient Re-evaluated prior to induction Oxygen Delivery Method: Circle system utilized Preoxygenation: Pre-oxygenation with 100% oxygen Induction Type: IV induction Ventilation: Mask ventilation without difficulty Laryngoscope Size: Miller and 2 Grade View: Grade I Tube type: Oral Tube size: 7.0 mm Number of attempts: 1 Airway Equipment and Method: Stylet Placement Confirmation: positive ETCO2,  ETT inserted through vocal cords under direct vision,  CO2 detector and breath sounds checked- equal and bilateral Secured at: 21 cm Tube secured with: Tape Dental Injury: Teeth and Oropharynx as per pre-operative assessment  Comments: Intubation by EMT student. C. Ganddis.

## 2020-05-23 NOTE — Anesthesia Preprocedure Evaluation (Signed)
Anesthesia Evaluation  Patient identified by MRN, date of birth, ID band Patient awake    Reviewed: Allergy & Precautions, NPO status , Patient's Chart, lab work & pertinent test results  History of Anesthesia Complications (+) PONV and history of anesthetic complications  Airway Mallampati: III  TM Distance: >3 FB Neck ROM: Full    Dental no notable dental hx.    Pulmonary neg pulmonary ROS,    Pulmonary exam normal breath sounds clear to auscultation       Cardiovascular + Peripheral Vascular Disease  Normal cardiovascular exam Rhythm:Regular Rate:Normal     Neuro/Psych Anxiety negative neurological ROS     GI/Hepatic Neg liver ROS, GERD  Medicated and Controlled,  Endo/Other  diabetes, Oral Hypoglycemic Agents  Renal/GU negative Renal ROS     Musculoskeletal  (+) Arthritis ,   Abdominal   Peds  Hematology HLD   Anesthesia Other Findings BRCA 2 POSITIIVE  Reproductive/Obstetrics                             Anesthesia Physical Anesthesia Plan  ASA: III  Anesthesia Plan: General   Post-op Pain Management:    Induction: Intravenous  PONV Risk Score and Plan: 4 or greater and Ondansetron, Dexamethasone, Midazolam, Scopolamine patch - Pre-op, Treatment may vary due to age or medical condition and Propofol infusion  Airway Management Planned:   Additional Equipment:   Intra-op Plan:   Post-operative Plan: Extubation in OR  Informed Consent: I have reviewed the patients History and Physical, chart, labs and discussed the procedure including the risks, benefits and alternatives for the proposed anesthesia with the patient or authorized representative who has indicated his/her understanding and acceptance.     Dental advisory given  Plan Discussed with: CRNA  Anesthesia Plan Comments:         Anesthesia Quick Evaluation

## 2020-05-23 NOTE — Op Note (Signed)
OPERATIVE NOTE  Pre-operative Diagnosis: BRCA2, h/o endometriosis  Post-operative Diagnosis: same, stage IV endometriosis  Operation: Robotic-assisted laparoscopic bilateral salpingoophorectomy, enterolysis for 45 minutes, left ureterolysis   Surgeon: Jeral Pinch MD  Assistant Surgeon: Lahoma Crocker MD (an MD assistant was necessary for tissue manipulation, management of robotic instrumentation, retraction and positioning due to the complexity of the case and hospital policies).   Anesthesia: GET  Urine Output: 400cc  Operative Findings: On EUA, minimal uterine mobility. On intra-abdominal entry, normal appearing liver edge, diaphragm, stomach, omentum. No ascites. Complete obliteration of the posterior cul de sac. Uterus 8 cm with small fibroids. Bilateral adnexa retroperitoneal, both approximately 3-4 cm in size. Sigmoid colon and mesentery densely adherent to the left IP ligament, left pelvic sidewall, posterior kissing ovaries and uterus. Some obliteration of the anterior cul de sac also noted. All findings c/w endometriosis. No intra-abdominal or pelvic evidence of disease.  Estimated Blood Loss:  less than 100 mL      Total IV Fluids: see I&O flowsheet         Specimens: bilateral tubes and ovaries, pelvic washings         Complications:  None apparent; patient tolerated the procedure well.         Disposition: PACU - hemodynamically stable.  Procedure Details  The patient was seen in the Holding Room. The risks, benefits, complications, treatment options, and expected outcomes were discussed with the patient.  The patient concurred with the proposed plan, giving informed consent.  The site of surgery properly noted/marked. The patient was identified as Scientist, product/process development and the procedure verified as a Robotic-assisted BSO, possible total hysterectomy, possible staging.   After induction of anesthesia, the patient was draped and prepped in the usual sterile manner.  Patient was placed in supine position after anesthesia and draped and prepped in the usual sterile manner as follows: Her arms were tucked to her side with all appropriate precautions.  The shoulders were stabilized with padded shoulder blocks applied to the acromium processes.  The patient was placed in the semi-lithotomy position in [place].  The perineum and vagina were prepped with CholoraPrep. The patient was draped after the CholoraPrep had been allowed to dry for 3 minutes.  A Time Out was held and the above information confirmed.  The urethra was prepped with Betadine. Foley catheter was placed.  A sterile speculum was placed in the vagina.  The cervix was grasped with a single-tooth tenaculum. The cervix was dilated with Kennon Rounds dilators.  The ZUMI uterine manipulator was placed without difficulty. OG tube placement was confirmed and to suction.   Next, a 10 mm skin incision was made 1 cm below the subcostal margin in the midclavicular line.  The 5 mm Optiview port and scope was used for direct entry.  Opening pressure was under 10 mm CO2.  The abdomen was insufflated and the findings were noted as above.   At this point and all points during the procedure, the patient's intra-abdominal pressure did not exceed 15 mmHg. Next, an 8 mm skin incision was made superior to the umbilicus and a right and left port were placed about 8 cm lateral to the robot port on the right and left side.  A fourth arm was placed on the right.  The 5 mm assist trocar was exchanged for a 10-12 mm port. All ports were placed under direct visualization.  The patient was placed in steep Trendelenburg.  The robot was docked in the normal manner.  Pelvic washings collected. Sharp and blunt dissection with short bursts of electrocautery were used to identify the sigmoid mesentery and the medial leaf of the broad ligament. With tension held on the sigmoid mesentery, this plane was slowly developed.   The right and left  peritoneum were opened parallel to the IP ligament to open the retroperitoneal spaces bilaterally. The round ligaments were left intact. The ureter was noted to be on the medial leaf of the broad ligament.  The peritoneum above the ureter was incised and stretched and the infundibulopelvic ligament was skeletonized, cauterized and cut. 2+ cm was assured along the IP ligament proximal to the ovary.  On the left, ureterolysis was performed to drop the ureter inferiorly from the adherent retroperitoneal left ovary.  Sharp dissection was then used to meticulously dissect the sigmoid and sigmoid mesentery free from the posterior uterus and the kissing ovaries.   The right ovary was elevated and the posterior peritoneum was cauterized just inferior to the ovary until the fallopian tube was identified as well as the posterior uterus. The mesosalpinx was cauterized until just lateral to the uterine fundus. The fallopian tube and utero-ovarian ligaments were isolated, cauterized and transected, ultimately freeing the right ovary and fallopian tube. This was placed in a bag and removed through the assist trocar.  The left adnexa was then elevated and the ovary was slowly dissected free from the broad ligament, assuring the ureter was below the level of the dissection. The uterine serosa was disrupted during the cauterization and transection of the fallopian tube and utero-ovarian ligament on the left. Electrocautery was used to achieve hemostasis. The left adnexa was placed in an endocatch bag and removed through the assist trocar.  Irrigation was used and excellent hemostasis was achieved.  Given raw surfaces including some deserosalized uterus, sigmoid mesentery and colon, Floseal was placed for hemostasis. The pressure in the abdomen was then decreased to 42m of Hg and no bleeding noted. At this point in the procedure was completed.  Robotic instruments were removed under direct visulaization.  The robot was  undocked. The fascia at the 10-12 mm port was closed with 0 Vicryl on a UR-5 needle.  The subcuticular tissue was closed with 4-0 Vicryl and the skin was closed with 4-0 Monocryl in a subcuticular manner.  Dermabond was applied.    The vagina was swabbed with minimal bleeding noted. Foley catheter was removed. All sponge, lap and needle counts were correct x  3.   The patient was transferred to the recovery room in stable condition.  KJeral Pinch MD

## 2020-05-23 NOTE — Anesthesia Postprocedure Evaluation (Signed)
Anesthesia Post Note  Patient: Vanessa Allen  Procedure(s) Performed: XI ROBOTIC ASSISTED SALPINGO OOPHORECTOMY ;ENTEROLYSIS (Bilateral )     Patient location during evaluation: PACU Anesthesia Type: General Level of consciousness: awake Pain management: pain level controlled Vital Signs Assessment: post-procedure vital signs reviewed and stable Respiratory status: spontaneous breathing, nonlabored ventilation, respiratory function stable and patient connected to nasal cannula oxygen Cardiovascular status: blood pressure returned to baseline and stable Postop Assessment: no apparent nausea or vomiting Anesthetic complications: no   No complications documented.  Last Vitals:  Vitals:   05/23/20 1430 05/23/20 1445  BP: (!) 133/91 (!) 135/93  Pulse: 89 93  Resp: 16 15  Temp:  36.7 C  SpO2: 100% 99%    Last Pain:  Vitals:   05/23/20 1445  TempSrc:   PainSc: 0-No pain                 Cash Duce P Rynlee Lisbon

## 2020-05-23 NOTE — Progress Notes (Addendum)
Called one of patient's sister twice (left message), unable to get through on second sister's number. No family in waiting room.  Jeral Pinch MD Gynecologic Oncology

## 2020-05-23 NOTE — Transfer of Care (Signed)
Immediate Anesthesia Transfer of Care Note  Patient: Vanessa Allen  Procedure(s) Performed: XI ROBOTIC ASSISTED SALPINGO OOPHORECTOMY ;ENTEROLYSIS (Bilateral )  Patient Location: PACU  Anesthesia Type:General  Level of Consciousness: sedated and patient cooperative  Airway & Oxygen Therapy: Patient connected to face mask oxygen  Post-op Assessment: Report given to RN and Post -op Vital signs reviewed and stable  Post vital signs: stable  Last Vitals:  Vitals Value Taken Time  BP 125/79 05/23/20 1039  Temp    Pulse 71 05/23/20 1041  Resp 17 05/23/20 1041  SpO2 100 % 05/23/20 1041  Vitals shown include unvalidated device data.  Last Pain:  Vitals:   05/23/20 0631  TempSrc: Oral  PainSc:          Complications: No complications documented.

## 2020-05-23 NOTE — Interval H&P Note (Signed)
History and Physical Interval Note:  05/23/2020 7:24 AM  Vanessa Allen  has presented today for surgery, with the diagnosis of BRCA 2 POSITIIVE.  The various methods of treatment have been discussed with the patient and family. After consideration of risks, benefits and other options for treatment, the patient has consented to  Procedure(s): XI ROBOTIC ASSISTED SALPINGO OOPHORECTOMY AND POSSIBLE LAPAROTOMY (Bilateral) as a surgical intervention.  The patient's history has been reviewed, patient examined, no change in status, stable for surgery.  I have reviewed the patient's chart and labs.  Questions were answered to the patient's satisfaction.     Lafonda Mosses

## 2020-05-24 ENCOUNTER — Telehealth: Payer: Self-pay | Admitting: Oncology

## 2020-05-24 ENCOUNTER — Encounter (HOSPITAL_COMMUNITY): Payer: Self-pay | Admitting: Gynecologic Oncology

## 2020-05-24 LAB — SURGICAL PATHOLOGY

## 2020-05-24 LAB — CYTOLOGY - NON PAP

## 2020-05-24 NOTE — Telephone Encounter (Signed)
Left a message to see how Vanessa Allen is doing after surgery yesterday.  Requested a return call.

## 2020-05-24 NOTE — Telephone Encounter (Addendum)
Called Vanessa Allen and she said she is doing ok.  She is a sore and has had a small amount of pain.  She has taken Tylenol and ibuprofen which is helping.  She denies having any fever, nausea or any issues urinating.  She has not had a bm but is passing gas.  She said her incisions are dry but that one is "raised up" a little just over the incision and is almost like a bump.  Advised her that is normal and to call us with any questions or concerns.

## 2020-06-04 ENCOUNTER — Other Ambulatory Visit: Payer: Self-pay | Admitting: Neurology

## 2020-06-09 ENCOUNTER — Encounter: Payer: Self-pay | Admitting: Oncology

## 2020-06-11 NOTE — Progress Notes (Signed)
Gynecologic Oncology Return Clinic Visit  06/12/20   Reason for Visit: post-op follow-up  Treatment History: Oncology History  Ductal carcinoma in situ (DCIS) of right breast  08/31/2019 Initial Diagnosis   Ductal carcinoma in situ (DCIS) of right breast   09/01/2019 Cancer Staging   Staging form: Breast, AJCC 8th Edition - Clinical stage from 09/01/2019: Stage 0 (cTis (DCIS), cN0, cM0, ER+, PR+, HER2: Not Assessed) - Signed by Chauncey Cruel, MD on 03/14/2020 Stage prefix: Initial diagnosis Laterality: Right Staged by: Pathologist and managing physician Stage used in treatment planning: Yes National guidelines used in treatment planning: Yes Type of national guideline used in treatment planning: NCCN     Interval History: Patient presents today for postoperative follow-up, she is approximately 3 weeks status post robotic BSO, enterolysis, left ureterolysis for BRCA2 mutation found to have stage IV endometriosis.  She notes that recovery is going well.  She has occasional left lower quadrant pain, that is improving with time.  She continues to have constipation at baseline.  She also endorses hot flashes.  She denies any vaginal bleeding or discharge.  She reports a good appetite without nausea or emesis.  She denies any urinary symptoms.  She is continuing on tamoxifen until she sees Dr. Jana Allen at the end of the month.  Past Medical/Surgical History: Past Medical History:  Diagnosis Date  . Arthritis    knees and back   . BRCA2 gene mutation positive in female   . Cancer [facility])    Right Breast Cancer  . Complication of anesthesia   . Diabetes mellitus without complication ([place])    type 2   . Endometriosis   . Family history of adverse reaction to anesthesia    brother has issues with n/v after surgery   . Family history of BRCA gene mutation   . Family history of breast cancer   . Family history of leukemia   . Family history of ovarian cancer   . Family history of  stomach cancer   . Fibroid uterus   . GERD (gastroesophageal reflux disease)   . Personal history of radiation therapy   . PONV (postoperative nausea and vomiting)   . Venous insufficiency of lower extremity     Past Surgical History:  Procedure Laterality Date  . APPENDECTOMY    . BREAST BIOPSY Right 08/25/2019  . BREAST LUMPECTOMY Right 09/23/2019  . BREAST LUMPECTOMY WITH RADIOACTIVE SEED AND SENTINEL LYMPH NODE BIOPSY Right 09/23/2019   Procedure: RIGHT BREAST LUMPECTOMY WITH RADIOACTIVE SEED AND SENTINEL LYMPH NODE MAPPING;  Surgeon: Erroll Luna, MD;  Location: [place];  Service: General;  Laterality: Right;  PEC BLOCK  . CHOLECYSTECTOMY     lsc chole cystectomy and appendectomy 1 month after myomectomy  . CYST EXCISION Left 12/13/2019   Procedure: Excision left dorsal wrist ganglion cyst;  Surgeon: Cindra Presume, MD;  Location: [place];  Service: Plastics;  Laterality: Left;  45 min  . ENDOVENOUS ABLATION SAPHENOUS VEIN W/ LASER Left 12/30/2019   endovenous laser ablation left greater saphenous vein by Gae Gallop MD   . Lipoma removal Left    Left Lower Back  . MYOMECTOMY    . ROBOTIC ASSISTED SALPINGO OOPHERECTOMY Bilateral 05/23/2020   Procedure: XI ROBOTIC ASSISTED SALPINGO OOPHORECTOMY ;ENTEROLYSIS;  Surgeon: Lafonda Mosses, MD;  Location[facility];  Service: Gynecology;  Laterality: Bilateral;  . WISDOM TOOTH EXTRACTION      Family History  Problem Relation Age of Onset  .  Hyperlipidemia Mother   . Hypertension Mother   . Varicose Veins Mother   . Diabetes Father   . Heart disease Father   . Stomach cancer Father 62  . Hypertension Brother   . Leukemia Paternal Aunt        dx. >50  . Breast cancer Niece 69  . BRCA 1/2 Niece   . Ovarian cancer Cousin        d. <50 (maternal first cousin)  . Ovarian cancer Cousin        d. <50 (maternal first cousin)  . Ovarian cancer Cousin        d. late 46s (maternal first cousin)  . Colon cancer  Neg Hx   . Pancreatic cancer Neg Hx   . Prostate cancer Neg Hx     Social History   Socioeconomic History  . Marital status: Single    Spouse name: Not on file  . Number of children: Not on file  . Years of education: Not on file  . Highest education level: Not on file  Occupational History  . Occupation: travel Marine scientist  Tobacco Use  . Smoking status: Never Smoker  . Smokeless tobacco: Never Used  Vaping Use  . Vaping Use: Never used  Substance and Sexual Activity  . Alcohol use: Yes    Alcohol/week: 0.0 standard drinks    Comment: rare   . Drug use: Never  . Sexual activity: Not Currently  Other Topics Concern  . Not on file  Social History Narrative  . Not on file   Social Determinants of Health   Financial Resource Strain: Not on file  Food Insecurity: Not on file  Transportation Needs: Not on file  Physical Activity: Not on file  Stress: Not on file  Social Connections: Not on file    Current Medications:  Current Outpatient Medications:  .  ACCU-CHEK GUIDE test strip, USE TWICE DAILY TO CHECK BLOOD SUGAR BEFORE BREAKFAST AND DINNER, Disp: 100 strip, Rfl: 5 .  acidophilus (RISAQUAD) CAPS capsule, Take 1 capsule by mouth daily., Disp: , Rfl:  .  ALPRAZolam (XANAX) 0.5 MG tablet, TAKE 1 TABLET(0.5 MG) BY MOUTH THREE TIMES DAILY AS NEEDED FOR ANXIETY, Disp: 30 tablet, Rfl: 3 .  amitriptyline (ELAVIL) 25 MG tablet, Take 25 mg by mouth at bedtime., Disp: , Rfl:  .  aspirin EC 81 MG tablet, Take 81 mg by mouth daily. Swallow whole., Disp: , Rfl:  .  atorvastatin (LIPITOR) 10 MG tablet, TAKE 1 TABLET(10 MG) BY MOUTH DAILY (Patient taking differently: Take 10 mg by mouth every 7 (seven) days.), Disp: 90 tablet, Rfl: 1 .  BELSOMRA 10 MG TABS, TAKE 1 TABLET BY MOUTH EVERY NIGHT AT BEDTIME AS NEEDED (Patient taking differently: Take 10 mg by mouth at bedtime as needed (sleep).), Disp: 30 tablet, Rfl: 2 .  Biotin w/ Vitamins C & E (HAIR/SKIN/NAILS PO), Take 1 tablet by mouth  daily., Disp: , Rfl:  .  Coenzyme Q10 (CO Q-10) 100 MG CAPS, Take by mouth., Disp: , Rfl:  .  Continuous Blood Gluc Receiver (FREESTYLE LIBRE 14 DAY READER) DEVI, USE TO CHECK BLOOD SUGAR BEFORE MEALS AND AT BEDTIME, Disp: 1 each, Rfl: 2 .  Continuous Blood Gluc Sensor (FREESTYLE LIBRE 14 DAY SENSOR) MISC, USE TO MEASURE BLOOD GLUCOSE FOUR TIMES DAILY BEFORE MEALS AND AT BEDTIME, Disp: 2 each, Rfl: 11 .  cyclobenzaprine (FLEXERIL) 10 MG tablet, Take 10 mg by mouth at bedtime as needed for muscle spasms.  (Patient  not taking: No sig reported), Disp: , Rfl:  .  esomeprazole (NEXIUM) 20 MG capsule, TAKE 1 CAPSULE(20 MG) BY MOUTH DAILY, Disp: 90 capsule, Rfl: 1 .  Glucose Blood (GLUCOSE METER TEST VI), by In Vitro route 2 (two) times daily., Disp: , Rfl:  .  HYDROcodone-acetaminophen (NORCO/VICODIN) 5-325 MG tablet, Take 1 tablet by mouth every 4 (four) hours as needed for moderate pain or severe pain. For AFTER surgery only, do not take and drive, Disp: 10 tablet, Rfl: 0 .  ibuprofen (ADVIL) 800 MG tablet, Take 1 tablet (800 mg total) by mouth every 8 (eight) hours as needed for moderate pain. For AFTER surgery, Disp: 30 tablet, Rfl: 0 .  meloxicam (MOBIC) 15 MG tablet, Take 15 mg by mouth daily as needed for pain.  (Patient not taking: No sig reported), Disp: , Rfl:  .  metFORMIN (GLUCOPHAGE) 500 MG tablet, Take 1 tablet (500 mg total) by mouth daily with breakfast. TAKE 1 TABLET BY MOUTH EVERY DAY WITH BREAKFAST, Disp: 90 tablet, Rfl: 1 .  Multiple Vitamin (MULTIVITAMIN WITH MINERALS) TABS tablet, Take 1 tablet by mouth daily., Disp: , Rfl:  .  Omega-3 1000 MG CAPS, Take 1,000 mg by mouth daily., Disp: , Rfl:  .  Semaglutide (RYBELSUS) 14 MG TABS, Take 1 tablet by mouth daily. Take 30 minutes before breakfast (Patient taking differently: Take 14 mg by mouth daily. Take 30 minutes before breakfast), Disp: 90 tablet, Rfl: 0 .  senna-docusate (SENOKOT-S) 8.6-50 MG tablet, Take 2 tablets by mouth at  bedtime. For AFTER surgery, do not take if having diarrhea, Disp: 30 tablet, Rfl: 0 .  tamoxifen (NOLVADEX) 20 MG tablet, Take 20 mg by mouth daily., Disp: , Rfl:  .  Turmeric (QC TUMERIC COMPLEX PO), Take 1,000 mg by mouth daily., Disp: , Rfl:   Review of Systems: Pertinent positives as per HPI. Denies appetite changes, fevers, chills, fatigue, unexplained weight changes. Denies hearing loss, neck lumps or masses, mouth sores, ringing in ears or voice changes. Denies cough or wheezing.  Denies shortness of breath. Denies chest pain or palpitations. Denies leg swelling. Denies abdominal distention, blood in stools, constipation, diarrhea, nausea, vomiting, or early satiety. Denies pain with intercourse, dysuria, frequency, hematuria or incontinence. Denies vaginal bleeding or vaginal discharge.   Denies joint pain, back pain or muscle pain/cramps. Denies itching, rash, or wounds. Denies dizziness, headaches, numbness or seizures. Denies swollen lymph nodes or glands, denies easy bruising or bleeding. Denies anxiety, depression, confusion, or decreased concentration.  Physical Exam: BP (!) 123/99 (BP Location: Left Arm, Patient Position: Sitting)   Pulse (!) 108   Temp 99.1 F (37.3 C) (Oral)   Resp 18   Ht '5\' 6"'  (1.676 m)   Wt 179 lb (81.2 kg)   SpO2 100%   BMI 28.89 kg/m  General: Alert, oriented, no acute distress. HEENT: Normocephalic, atraumatic, sclera anicteric. Chest: Unlabored breathing on room air. Abdomen: soft, nontender.  Normoactive bowel sounds.  No masses or hepatosplenomegaly appreciated.  Well-healing laparoscopic incisions. Extremities: Grossly normal range of motion.  Warm, well perfused.  No edema bilaterally.  Laboratory & Radiologic Studies: Pathology: A. OVARY AND FALLOPIAN TUBE, LEFT, SALPINGO OOPHORECTOMY:  - Benign ovary and fallopian tube with endometriosis.  - Paratubal cysts.   B. OVARY AND FALLOPIAN TUBE, RIGHT, SALPINGO OOPHORECTOMY:  - Benign  ovary and fallopian tube with endometriosis.  - Ovarian leiomyoma.  - Paratubal cysts.   Pelvic washings: no malignant cells. Reactive mesothelial cells present  Assessment &  Plan: Vanessa Allen is a 55 y.o. woman with ER+ DCIS and known BRCA2 mutation now s/p risk-reducing and therapeutic BSO.   The patient is overall healing well from surgery.  I discussed findings with her in detail at the time of surgery including stage IV endometriosis.  We discussed continued activity restrictions until she is 4-6 weeks out from surgery.  In terms of her BRCA2 status, we discussed that surgery significantly reduces her risk of cancer although she still has about a 4% risk of primary peritoneal carcinoma.  Today we reviewed signs and symptoms that would be concerning for primary peritoneal cancer and she knows to call me if she develops any of these.  I recommend that she have an annual exam either with her primary care provider, gynecologist, or her medical oncologist with review of the symptoms.  In terms of her gynecologic care, she will need continued Pap smear screening until age 51.  28 minutes of total time was spent for this patient encounter, including preparation, face-to-face counseling with the patient and coordination of care, and documentation of the encounter.  Jeral Pinch, MD  Division of Gynecologic Oncology  Department of Obstetrics and Gynecology  [facility]

## 2020-06-12 ENCOUNTER — Encounter: Payer: Self-pay | Admitting: Gynecologic Oncology

## 2020-06-12 ENCOUNTER — Other Ambulatory Visit: Payer: Self-pay

## 2020-06-12 ENCOUNTER — Inpatient Hospital Stay: Payer: BC Managed Care – PPO | Attending: Gynecologic Oncology | Admitting: Gynecologic Oncology

## 2020-06-12 VITALS — BP 123/99 | HR 108 | Temp 99.1°F | Resp 18 | Ht 66.0 in | Wt 179.0 lb

## 2020-06-12 DIAGNOSIS — Z1501 Genetic susceptibility to malignant neoplasm of breast: Secondary | ICD-10-CM

## 2020-06-12 DIAGNOSIS — Z1502 Genetic susceptibility to malignant neoplasm of ovary: Secondary | ICD-10-CM

## 2020-06-12 DIAGNOSIS — Z90722 Acquired absence of ovaries, bilateral: Secondary | ICD-10-CM

## 2020-06-12 DIAGNOSIS — C50911 Malignant neoplasm of unspecified site of right female breast: Secondary | ICD-10-CM

## 2020-06-12 MED ORDER — RYBELSUS 14 MG PO TABS: 1.0000 | ORAL_TABLET | Freq: Every day | ORAL | 0 refills | Status: DC

## 2020-06-12 NOTE — Patient Instructions (Addendum)
It was good to see you today.  You are healing very well from surgery!  As we discussed, your risk of ovarian/fallopian tube/primary peritoneal cancer is significantly decreased after removal of your tubes and ovaries.  There is still about a 4% risk of primary peritoneal cancer.  I do not recommend any routine imaging or tumor marker testing, but I do recommend that you have a yearly visit with somebody who does an exam and asks you about the symptoms that we discussed today that could indicate primary peritoneal cancer.  If you develop any of these, please call my office.  For your GYN care, you will continue to see your gynecologist for routine cervical cancer testing with Pap test until you are 55 years old.

## 2020-06-14 DIAGNOSIS — G4733 Obstructive sleep apnea (adult) (pediatric): Secondary | ICD-10-CM | POA: Diagnosis not present

## 2020-06-19 ENCOUNTER — Ambulatory Visit: Payer: BC Managed Care – PPO | Attending: Surgery

## 2020-06-19 ENCOUNTER — Other Ambulatory Visit: Payer: Self-pay

## 2020-06-19 DIAGNOSIS — Z483 Aftercare following surgery for neoplasm: Secondary | ICD-10-CM | POA: Insufficient documentation

## 2020-06-19 NOTE — Therapy (Signed)
[place], [place], [address] Phone: [phone]   Fax:  [phone]  Physical Therapy Treatment  Patient Details  Name: Vanessa Allen MRN: [id number] Date of Birth: 1965-08-20 Referring Provider (PT): Dr. Erroll Luna   Encounter Date: 06/19/2020   PT End of Session - 06/19/20 1457    Visit Number 2   # unchanged due to screen only   Number of Visits 2    PT Start Time 1452    PT Stop Time 1457    PT Time Calculation (min) 5 min    Activity Tolerance Patient tolerated treatment well    Behavior During Therapy [facility] for tasks assessed/performed           Past Medical History:  Diagnosis Date  . Arthritis    knees and back   . BRCA2 gene mutation positive in female   . Cancer [facility])    Right Breast Cancer  . Complication of anesthesia   . Diabetes mellitus without complication ([place])    type 2   . Endometriosis   . Family history of adverse reaction to anesthesia    brother has issues with n/v after surgery   . Family history of BRCA gene mutation   . Family history of breast cancer   . Family history of leukemia   . Family history of ovarian cancer   . Family history of stomach cancer   . Fibroid uterus   . GERD (gastroesophageal reflux disease)   . Personal history of radiation therapy   . PONV (postoperative nausea and vomiting)   . Venous insufficiency of lower extremity     Past Surgical History:  Procedure Laterality Date  . APPENDECTOMY    . BREAST BIOPSY Right 08/25/2019  . BREAST LUMPECTOMY Right 09/23/2019  . BREAST LUMPECTOMY WITH RADIOACTIVE SEED AND SENTINEL LYMPH NODE BIOPSY Right 09/23/2019   Procedure: RIGHT BREAST LUMPECTOMY WITH RADIOACTIVE SEED AND SENTINEL LYMPH NODE MAPPING;  Surgeon: Erroll Luna, MD;  Location: [place];  Service: General;  Laterality: Right;  PEC BLOCK  . CHOLECYSTECTOMY     lsc chole cystectomy and appendectomy 1 month after myomectomy  .  CYST EXCISION Left 12/13/2019   Procedure: Excision left dorsal wrist ganglion cyst;  Surgeon: Cindra Presume, MD;  Location: [place];  Service: Plastics;  Laterality: Left;  45 min  . ENDOVENOUS ABLATION SAPHENOUS VEIN W/ LASER Left 12/30/2019   endovenous laser ablation left greater saphenous vein by Gae Gallop MD   . Lipoma removal Left    Left Lower Back  . MYOMECTOMY    . ROBOTIC ASSISTED SALPINGO OOPHERECTOMY Bilateral 05/23/2020   Procedure: XI ROBOTIC ASSISTED SALPINGO OOPHORECTOMY ;ENTEROLYSIS;  Surgeon: Lafonda Mosses, MD;  [facility];  Service: Gynecology;  Laterality: Bilateral;  . WISDOM TOOTH EXTRACTION      There were no vitals filed for this visit.   Subjective Assessment - 06/19/20 1453    Subjective Pt returns for 3 month L-Dex screen.    Pertinent History Patient was diagnosed on 07/23/2019 with right intermediate grade DCIS with concerns for possible invasive disease. It measures 1.9 cm and is located in the upper inner quadrant. It is ER/PR positive. On 09/23/2019, she had a right lumpectomy with 3 nodes removed.  Radiatation started 9/27 and is schedled til 11/9 she will have no chemo                  L-DEX FLOWSHEETS -  06/19/20 1400      L-DEX LYMPHEDEMA SCREENING   Measurement Type Unilateral    L-DEX MEASUREMENT EXTREMITY Upper Extremity    POSITION  Standing    DOMINANT SIDE Left    At Risk Side Right    BASELINE SCORE (UNILATERAL) 3.3    L-DEX SCORE (UNILATERAL) 3.8    VALUE CHANGE (UNILAT) 0.5                                  PT Long Term Goals - 11/01/19 1236      PT LONG TERM GOAL #1   Title Patient will demonstrate she has regained full shoulder ROM and function post operatively compared to baselines.    Baseline pt reports she has mild pain in right shoudler    Time 8    Period Weeks    Status Partially Met                 Plan - 06/19/20 1457    Clinical Impression  Statement Pt returns for her 3 month L-Dex screen. Her change from baseline of 0.5 is WNLs so no further treat ment is required at this time except to cont every 3 month L-Dex screens which pt is agreeable to.    PT Next Visit Plan Cont every 3 month L-Dex screens for up to 2 years from her SLNB.    Consulted and Agree with Plan of Care Patient           Patient will benefit from skilled therapeutic intervention in order to improve the following deficits and impairments:     Visit Diagnosis: Aftercare following surgery for neoplasm     Problem List Patient Active Problem List   Diagnosis Date Noted  . Endometriosis   . PVD (peripheral vascular disease) ([place]) 02/28/2020  . Breast cancer, BRCA2 positive, right ([place]) 12/08/2019  . Central sleep apnea 09/20/2019  . Genetic testing 09/10/2019  . BRCA2 gene mutation positive in female 09/10/2019  . Family history of BRCA gene mutation   . Family history of breast cancer   . Family history of ovarian cancer   . Family history of stomach cancer   . Family history of leukemia   . Ductal carcinoma in situ (DCIS) of right breast 08/31/2019  . Chronic insomnia 06/08/2019  . Atypical chest pain 07/20/2018  . Chest discomfort 05/21/2018  . Anxiety 05/21/2018  . Snoring 10/27/2017  . Episodic circadian rhythm sleep disorder, shift work type 10/27/2017  . Insomnia 10/27/2017  . Type 2 diabetes mellitus without complication, without long-term current use of insulin ([place]) 10/27/2017    Otelia Limes, PTA 06/19/2020, 2:59 PM  [place], [place], [address] Phone: [phone]   Fax:  [phone]  Name: Sayana Salley MRN: [id number] Date of Birth: Oct 24, 1965

## 2020-06-23 ENCOUNTER — Other Ambulatory Visit: Payer: Self-pay

## 2020-06-23 DIAGNOSIS — D0511 Intraductal carcinoma in situ of right breast: Secondary | ICD-10-CM

## 2020-06-26 NOTE — Progress Notes (Signed)
S/p bilateral salpingo-oophorectomy 05/23/2020 with benign pathoogy

## 2020-06-27 ENCOUNTER — Encounter: Payer: Self-pay | Admitting: Oncology

## 2020-06-27 ENCOUNTER — Other Ambulatory Visit: Payer: Self-pay

## 2020-06-27 ENCOUNTER — Encounter: Payer: Self-pay | Admitting: Nurse Practitioner

## 2020-06-27 ENCOUNTER — Inpatient Hospital Stay: Payer: BC Managed Care – PPO

## 2020-06-27 ENCOUNTER — Ambulatory Visit (INDEPENDENT_AMBULATORY_CARE_PROVIDER_SITE_OTHER): Payer: BC Managed Care – PPO | Admitting: Nurse Practitioner

## 2020-06-27 ENCOUNTER — Inpatient Hospital Stay: Payer: BC Managed Care – PPO | Attending: Oncology | Admitting: Oncology

## 2020-06-27 VITALS — BP 112/70 | HR 112 | Temp 99.2°F | Ht 66.0 in | Wt 184.2 lb

## 2020-06-27 VITALS — BP 121/69 | HR 117 | Temp 97.5°F | Resp 18 | Ht 66.0 in | Wt 184.7 lb

## 2020-06-27 DIAGNOSIS — D0511 Intraductal carcinoma in situ of right breast: Secondary | ICD-10-CM

## 2020-06-27 DIAGNOSIS — E119 Type 2 diabetes mellitus without complications: Secondary | ICD-10-CM

## 2020-06-27 DIAGNOSIS — R000 Tachycardia, unspecified: Secondary | ICD-10-CM | POA: Diagnosis not present

## 2020-06-27 DIAGNOSIS — Z923 Personal history of irradiation: Secondary | ICD-10-CM | POA: Diagnosis not present

## 2020-06-27 DIAGNOSIS — Z23 Encounter for immunization: Secondary | ICD-10-CM

## 2020-06-27 DIAGNOSIS — I700 Atherosclerosis of aorta: Secondary | ICD-10-CM | POA: Diagnosis not present

## 2020-06-27 LAB — CMP (CANCER CENTER ONLY)
ALT: 25 U/L (ref 0–44)
AST: 19 U/L (ref 15–41)
Albumin: 3.9 g/dL (ref 3.5–5.0)
Alkaline Phosphatase: 76 U/L (ref 38–126)
Anion gap: 10 (ref 5–15)
BUN: 16 mg/dL (ref 6–20)
CO2: 22 mmol/L (ref 22–32)
Calcium: 9.1 mg/dL (ref 8.9–10.3)
Chloride: 109 mmol/L (ref 98–111)
Creatinine: 0.89 mg/dL (ref 0.44–1.00)
GFR, Estimated: >60 mL/min (ref >60)
Glucose, Bld: 161 mg/dL — ABNORMAL HIGH (ref 70–99)
Potassium: 3.8 mmol/L (ref 3.5–5.1)
Sodium: 141 mmol/L (ref 135–145)
Total Bilirubin: 0.4 mg/dL (ref 0.3–1.2)
Total Protein: 7 g/dL (ref 6.5–8.1)

## 2020-06-27 LAB — CBC WITH DIFFERENTIAL (CANCER CENTER ONLY)
Abs Immature Granulocytes: 0.01 10*3/uL (ref 0.00–0.07)
Basophils Absolute: 0 10*3/uL (ref 0.0–0.1)
Basophils Relative: 1 %
Eosinophils Absolute: 0.2 10*3/uL (ref 0.0–0.5)
Eosinophils Relative: 6 %
HCT: 39.4 % (ref 36.0–46.0)
Hemoglobin: 12.9 g/dL (ref 12.0–15.0)
Immature Granulocytes: 0 %
Lymphocytes Relative: 44 %
Lymphs Abs: 1.8 10*3/uL (ref 0.7–4.0)
MCH: 27.5 pg (ref 26.0–34.0)
MCHC: 32.7 g/dL (ref 30.0–36.0)
MCV: 84 fL (ref 80.0–100.0)
Monocytes Absolute: 0.2 10*3/uL (ref 0.1–1.0)
Monocytes Relative: 6 %
Neutro Abs: 1.7 10*3/uL (ref 1.7–7.7)
Neutrophils Relative %: 43 %
Platelet Count: 217 10*3/uL (ref 150–400)
RBC: 4.69 MIL/uL (ref 3.87–5.11)
RDW: 13.2 % (ref 11.5–15.5)
WBC Count: 4 10*3/uL (ref 4.0–10.5)
nRBC: 0 % (ref 0.0–0.2)

## 2020-06-27 MED ORDER — GABAPENTIN 300 MG PO CAPS: 300.0000 mg | ORAL_CAPSULE | Freq: Every day | ORAL | 4 refills | Status: DC

## 2020-06-27 MED ORDER — SHINGRIX 50 MCG/0.5ML IM SUSR
0.5000 mL | Freq: Once | INTRAMUSCULAR | 0 refills | Status: AC
Start: 2020-06-27 — End: 2020-06-27

## 2020-06-27 MED ORDER — ATORVASTATIN CALCIUM 10 MG PO TABS: 10.0000 mg | ORAL_TABLET | ORAL | 0 refills | Status: DC

## 2020-06-27 NOTE — Progress Notes (Signed)
I,Yamilka Roman [employer] as a Education administrator for [employer], FNP.,have documented all relevant documentation on the behalf of [name], FNP,as directed by  [name], FNP while in the presence of [name], [place]. This visit occurred during the SARS-CoV-2 public health emergency.  Safety protocols were in place, including screening questions prior to the visit, additional usage of staff PPE, and extensive cleaning of exam room while observing appropriate contact time as indicated for disinfecting solutions.  Subjective:     Patient ID: Vanessa Allen , female    DOB: 05/15/1965 , 55 y.o.   MRN: [id number]   Chief Complaint  Patient presents with   Diabetes    HPI  Patient presents today for a diabetes f/u. No concerns at this time  She is still out on leave from her breast surgery.   Wt Readings from Last 3 Encounters: 06/27/20 : 184 lb 3.2 oz (83.6 kg) 06/27/20 : 184 lb 11.2 oz (83.8 kg) 06/12/20 : 179 lb (81.2 kg)   Diabetes She presents for her follow-up diabetic visit. Diabetes type: prediabetes. There are no hypoglycemic associated symptoms. There are no diabetic associated symptoms. Pertinent negatives for diabetes include no chest pain, no polydipsia, no polyphagia and no polyuria. There are no hypoglycemic complications. Symptoms are stable. There are no diabetic complications. Risk factors for coronary artery disease include obesity and sedentary lifestyle. Current diabetic treatment includes oral agent (monotherapy). She is compliant with treatment all of the time. She is following a generally healthy diet. When asked about meal planning, she reported none. She has not had a previous visit with a dietitian. There is no change in her home blood glucose trend. (Her blood sugar has overall been doing okay. ) An ACE inhibitor/angiotensin II receptor blocker is not being taken.  Insomnia Primary symptoms: no sleep disturbance.   The onset quality is gradual. The problem  occurs nightly. Past treatments include medication (belsomra - has not seen significant difference but not taking consistently). The treatment provided no relief. Prior workup: she had a home sleep study was not approved for sleep study in person.    Past Medical History:  Diagnosis Date   Arthritis    knees and back    BRCA2 gene mutation positive in female    Cancer [facility])    Right Breast Cancer   Complication of anesthesia    Diabetes mellitus without complication ([place])    type 2    Endometriosis    Family history of adverse reaction to anesthesia    brother has issues with n/v after surgery    Family history of BRCA gene mutation    Family history of breast cancer    Family history of leukemia    Family history of ovarian cancer    Family history of stomach cancer    Fibroid uterus    GERD (gastroesophageal reflux disease)    Personal history of radiation therapy    PONV (postoperative nausea and vomiting)    Venous insufficiency of lower extremity      Family History  Problem Relation Age of Onset   Hyperlipidemia Mother    Hypertension Mother    Varicose Veins Mother    Diabetes Father    Heart disease Father    Stomach cancer Father 67   Hypertension Brother    Leukemia Paternal Aunt        dx. >50   Breast cancer Niece 49   BRCA 1/2 Niece    Ovarian cancer Cousin  d. <50 (maternal first cousin)   Ovarian cancer Cousin        d. <50 (maternal first cousin)   Ovarian cancer Cousin        d. late 4s (maternal first cousin)   Colon cancer Neg Hx    Pancreatic cancer Neg Hx    Prostate cancer Neg Hx      Current Outpatient Medications:    ACCU-CHEK GUIDE test strip, USE TWICE DAILY TO CHECK BLOOD SUGAR BEFORE BREAKFAST AND DINNER, Disp: 100 strip, Rfl: 5   acidophilus (RISAQUAD) CAPS capsule, Take 1 capsule by mouth daily., Disp: , Rfl:    ALPRAZolam (XANAX) 0.5 MG tablet, TAKE 1 TABLET(0.5 MG) BY MOUTH THREE TIMES DAILY AS NEEDED FOR ANXIETY, Disp: 30  tablet, Rfl: 3   amitriptyline (ELAVIL) 25 MG tablet, Take 25 mg by mouth at bedtime., Disp: , Rfl:    aspirin EC 81 MG tablet, Take 81 mg by mouth daily. Swallow whole., Disp: , Rfl:    BELSOMRA 10 MG TABS, TAKE 1 TABLET BY MOUTH EVERY NIGHT AT BEDTIME AS NEEDED (Patient taking differently: Take 10 mg by mouth at bedtime as needed (sleep).), Disp: 30 tablet, Rfl: 2   Biotin w/ Vitamins C & E (HAIR/SKIN/NAILS PO), Take 1 tablet by mouth daily., Disp: , Rfl:    Coenzyme Q10 (CO Q-10) 100 MG CAPS, Take by mouth., Disp: , Rfl:    Continuous Blood Gluc Receiver (FREESTYLE LIBRE 14 DAY READER) DEVI, USE TO CHECK BLOOD SUGAR BEFORE MEALS AND AT BEDTIME, Disp: 1 each, Rfl: 2   Continuous Blood Gluc Sensor (FREESTYLE LIBRE 14 DAY SENSOR) MISC, USE TO MEASURE BLOOD GLUCOSE FOUR TIMES DAILY BEFORE MEALS AND AT BEDTIME, Disp: 2 each, Rfl: 11   cyclobenzaprine (FLEXERIL) 10 MG tablet, Take 10 mg by mouth at bedtime as needed for muscle spasms., Disp: , Rfl:    esomeprazole (NEXIUM) 20 MG capsule, TAKE 1 CAPSULE(20 MG) BY MOUTH DAILY, Disp: 90 capsule, Rfl: 1   gabapentin (NEURONTIN) 300 MG capsule, Take 1 capsule (300 mg total) by mouth at bedtime., Disp: 90 capsule, Rfl: 4   Glucose Blood (GLUCOSE METER TEST VI), by In Vitro route 2 (two) times daily., Disp: , Rfl:    HYDROcodone-acetaminophen (NORCO/VICODIN) 5-325 MG tablet, Take 1 tablet by mouth every 4 (four) hours as needed for moderate pain or severe pain. For AFTER surgery only, do not take and drive, Disp: 10 tablet, Rfl: 0   ibuprofen (ADVIL) 800 MG tablet, Take 1 tablet (800 mg total) by mouth every 8 (eight) hours as needed for moderate pain. For AFTER surgery, Disp: 30 tablet, Rfl: 0   meloxicam (MOBIC) 15 MG tablet, Take 15 mg by mouth daily as needed for pain., Disp: , Rfl:    metFORMIN (GLUCOPHAGE) 500 MG tablet, Take 1 tablet (500 mg total) by mouth daily with breakfast. TAKE 1 TABLET BY MOUTH EVERY DAY WITH BREAKFAST, Disp: 90 tablet, Rfl: 1    Multiple Vitamin (MULTIVITAMIN WITH MINERALS) TABS tablet, Take 1 tablet by mouth daily., Disp: , Rfl:    Omega-3 1000 MG CAPS, Take 1,000 mg by mouth daily., Disp: , Rfl:    Semaglutide (RYBELSUS) 14 MG TABS, Take 1 tablet by mouth daily. Take 30 minutes before breakfast, Disp: 90 tablet, Rfl: 0   senna-docusate (SENOKOT-S) 8.6-50 MG tablet, Take 2 tablets by mouth at bedtime. For AFTER surgery, do not take if having diarrhea, Disp: 30 tablet, Rfl: 0   tamoxifen (NOLVADEX) 20 MG tablet, Take  20 mg by mouth daily., Disp: , Rfl:    Turmeric (QC TUMERIC COMPLEX PO), Take 1,000 mg by mouth daily., Disp: , Rfl:    atorvastatin (LIPITOR) 10 MG tablet, Take 1 tablet (10 mg total) by mouth every 7 (seven) days., Disp: 12 tablet, Rfl: 0   dicyclomine (BENTYL) 20 MG tablet, Take 1 tablet (20 mg total) by mouth 2 (two) times daily., Disp: 20 tablet, Rfl: 0   No Known Allergies   Review of Systems  Constitutional: Negative.   HENT: Negative.    Eyes: Negative.   Respiratory: Negative.    Cardiovascular: Negative.  Negative for chest pain, palpitations and leg swelling.       Reports having sharp chest pain a few days ago but has since resolved  Gastrointestinal: Negative.   Endocrine: Negative for polydipsia, polyphagia and polyuria.  Genitourinary: Negative.   Musculoskeletal: Negative.   Skin: Negative.   Neurological: Negative.   Hematological: Negative.   Psychiatric/Behavioral:  Negative for sleep disturbance. The patient has insomnia.     Today's Vitals   06/27/20 1359  BP: 112/70  Pulse: (!) 112  Temp: 99.2 F (37.3 C)  Weight: 184 lb 3.2 oz (83.6 kg)  Height: '5\' 6"'  (1.676 m)  PainSc: 0-No pain   Body mass index is 29.73 kg/m.   Objective:  Physical Exam Vitals reviewed.  Constitutional:      General: She is not in acute distress.    Appearance: Normal appearance.  Cardiovascular:     Rate and Rhythm: Regular rhythm. Tachycardia present.     Pulses: Normal pulses.      Heart sounds: Normal heart sounds. No murmur heard. Pulmonary:     Effort: Pulmonary effort is normal. No respiratory distress.     Breath sounds: Normal breath sounds. No wheezing.  Skin:    Capillary Refill: Capillary refill takes less than 2 seconds.  Neurological:     General: No focal deficit present.     Mental Status: She is alert and oriented to person, place, and time.     Cranial Nerves: No cranial nerve deficit.     Motor: No weakness.  Psychiatric:        Mood and Affect: Mood normal.        Behavior: Behavior normal.        Thought Content: Thought content normal.        Judgment: Judgment normal.        Assessment And Plan:     1. Type 2 diabetes mellitus without complication, without long-term current use of insulin (HCC) Chronic, stable Continue with current medications - atorvastatin (LIPITOR) 10 MG tablet; Take 1 tablet (10 mg total) by mouth every 7 (seven) days.  Dispense: 12 tablet; Refill: 0  2. Atherosclerosis of aorta ([place]) Continue with statin, tolerating well  3. Tachycardia EKG done with HR 105  She is encouraged to limit intake of caffeine - EKG 12-Lead - TSH  4. Encounter for immunization - Zoster Vaccine Adjuvanted [facility]) injection; Inject 0.5 mLs into the muscle once for 1 dose.  Dispense: 0.5 mL; Refill: 0     Patient was given opportunity to ask questions. Patient verbalized understanding of the plan and was able to repeat key elements of the plan. All questions were answered to their satisfaction.  [name], FNP   I, [name], FNP, have reviewed all documentation for this visit. The documentation on 06/27/20 for the exam, diagnosis, procedures, and orders are all accurate and complete.  IF YOU HAVE BEEN REFERRED TO A SPECIALIST, IT MAY TAKE 1-2 WEEKS TO SCHEDULE/PROCESS THE REFERRAL. IF YOU HAVE NOT HEARD FROM US/SPECIALIST IN TWO WEEKS, PLEASE GIVE [place] A CALL AT [phone].   THE PATIENT IS ENCOURAGED TO PRACTICE  SOCIAL DISTANCING DUE TO THE COVID-19 PANDEMIC.

## 2020-06-27 NOTE — Progress Notes (Signed)
[place]  [phone] Fax:([phone]) [phone]     ID: Vanessa Allen DOB: 18-Sep-1965  MR#: [id number]  RZN#:[id number]  Patient Care Team: [name], [place] as PCP - General (General Practice) Erroll Luna, MD as Consulting Physician (General Surgery) Vanessa Allen, Vanessa Dad, MD as Consulting Physician (Oncology) Kyung Rudd, MD as Consulting Physician (Radiation Oncology) Mauro Kaufmann, RN as Oncology Nurse Navigator Rockwell Germany, RN as Oncology Nurse Navigator Lafonda Mosses, MD as Consulting Physician (Gynecologic Oncology) Chauncey Cruel, MD OTHER MD:  CHIEF COMPLAINT: Ductal carcinoma in situ; BRCA2+  CURRENT TREATMENT: tamoxifen   INTERVAL HISTORY: Vanessa Allen returns today for follow up of her noninvasive breast cancer.  She started tamoxifen on 12/08/2019.  She is generally tolerating this well.  She obtains it at a good price.  Since her last visit, she underwent bilateral diagnostic mammography with tomography at [facility] on 03/15/2020 showing: breast density category D; no evidence of malignancy in either breast.   She also underwent pelvic ultrasound on 04/28/2020, which was negative.  Finally, she also underwent bilateral salpingo-oophorectomy on 05/23/2020 under Dr. Berline Lopes. Pathology from the procedure ([id number]) was benign.   REVIEW OF SYSTEMS: Vanessa Allen did very well with her BSO surgery.  She had no complications, fever, bleeding, or pain from it and within 2 days she was "fine".  She also has not noted a significant difference in symptoms.  She was having hot flashes before and she is not having any more right now.   COVID 19 VACCINATION STATUS: Vanessa Allen x2, status post booster   HISTORY OF CURRENT ILLNESS: From the original intake note:  Vanessa Allen had routine screening mammography on 07/23/2019 showing a possible abnormality in the right breast. She underwent right diagnostic mammography with tomography at  [facility] on 08/19/2019 showing: breast density category C; indeterminate 1.9 cm medial right breast calcifications.  Accordingly on 08/25/2019 she proceeded to biopsy of the right breast area in question. The pathology from this procedure ([id number]) showed: ductal carcinoma involving a papillary lesion. There is at least intermediate grade ductal carcinoma in situ, however, the lesion is fragmented hampering assessment and invasion can not be excluded. Lymph node sampling at the time of excision is recommended. Prognostic indicators significant for: estrogen receptor, 90% positive and progesterone receptor, 90% positive, both with strong staining intensity.  Right axilla ultrasound was performed on 08/31/2019 showing no abnormal-appearing lymph nodes.   The patient's subsequent history is as detailed below.   PAST MEDICAL HISTORY: Past Medical History:  Diagnosis Date  . Arthritis    knees and back   . BRCA2 gene mutation positive in female   . Cancer [facility])    Right Breast Cancer  . Complication of anesthesia   . Diabetes mellitus without complication ([place])    type 2   . Endometriosis   . Family history of adverse reaction to anesthesia    brother has issues with n/v after surgery   . Family history of BRCA gene mutation   . Family history of breast cancer   . Family history of leukemia   . Family history of ovarian cancer   . Family history of stomach cancer   . Fibroid uterus   . GERD (gastroesophageal reflux disease)   . Personal history of radiation therapy   . PONV (postoperative nausea and vomiting)   . Venous insufficiency of lower extremity     PAST SURGICAL HISTORY: Past Surgical History:  Procedure Laterality Date  .  APPENDECTOMY    . BREAST BIOPSY Right 08/25/2019  . BREAST LUMPECTOMY Right 09/23/2019  . BREAST LUMPECTOMY WITH RADIOACTIVE SEED AND SENTINEL LYMPH NODE BIOPSY Right 09/23/2019   Procedure: RIGHT BREAST LUMPECTOMY WITH RADIOACTIVE SEED AND  SENTINEL LYMPH NODE MAPPING;  Surgeon: Erroll Luna, MD;  Location: [facility];  Service: General;  Laterality: Right;  PEC BLOCK  . CHOLECYSTECTOMY     lsc chole cystectomy and appendectomy 1 month after myomectomy  . CYST EXCISION Left 12/13/2019   Procedure: Excision left dorsal wrist ganglion cyst;  Surgeon: Cindra Presume, MD;  Location: [facility];  Service: Plastics;  Laterality: Left;  45 min  . ENDOVENOUS ABLATION SAPHENOUS VEIN W/ LASER Left 12/30/2019   endovenous laser ablation left greater saphenous vein by Gae Gallop MD   . Lipoma removal Left    Left Lower Back  . MYOMECTOMY    . ROBOTIC ASSISTED SALPINGO OOPHERECTOMY Bilateral 05/23/2020   Procedure: XI ROBOTIC ASSISTED SALPINGO OOPHORECTOMY ;ENTEROLYSIS;  Surgeon: Lafonda Mosses, MD;  Location: [facility];  Service: Gynecology;  Laterality: Bilateral;  . WISDOM TOOTH EXTRACTION      FAMILY HISTORY: Family History  Problem Relation Age of Onset  . Hyperlipidemia Mother   . Hypertension Mother   . Varicose Veins Mother   . Diabetes Father   . Heart disease Father   . Stomach cancer Father 56  . Hypertension Brother   . Leukemia Paternal Aunt        dx. >50  . Breast cancer Niece 53  . BRCA 1/2 Niece   . Ovarian cancer Cousin        d. <50 (maternal first cousin)  . Ovarian cancer Cousin        d. <50 (maternal first cousin)  . Ovarian cancer Cousin        d. late 48s (maternal first cousin)  . Colon cancer Neg Hx   . Pancreatic cancer Neg Hx   . Prostate cancer Neg Hx    Her father died at age 41 after being diagnosed with stomach cancer. Her mother is age 22 (as of 08/2019). Vanessa Allen has 3 brothers and 2 sisters. She reports ovarian cancer in two maternal aunts and breast cancer in a niece (brother's daughter) at age 57.   GYNECOLOGIC HISTORY:  No LMP recorded. Patient is postmenopausal. Menarche: 55 years old GX P 0 LMP age [age] (early menopause) Contraceptive never used HRT used for <1  year  Hysterectomy? No, only myomectomy BSO? 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 She is single. She lives at home by herelf.    ADVANCED DIRECTIVES: not in place. She intends to name one of her sisters, Clent Demark or Insurance account manager as healthcare power of attorney.   HEALTH MAINTENANCE: Social History   Tobacco Use  . Smoking status: Never Smoker  . Smokeless tobacco: Never Used  Vaping Use  . Vaping Use: Never used  Substance Use Topics  . Alcohol use: Yes    Alcohol/week: 0.0 standard drinks    Comment: rare   . Drug use: Never     Colonoscopy: 11/2017, repeat due 2029  PAP: 06/2018, negative  Bone density: never done   No Known Allergies  Current Outpatient Medications  Medication Sig Dispense Refill  . gabapentin (NEURONTIN) 300 MG capsule Take 1 capsule (300 mg total) by mouth at bedtime. 90 capsule 4  . ACCU-CHEK GUIDE test  strip USE TWICE DAILY TO CHECK BLOOD SUGAR BEFORE BREAKFAST AND DINNER 100 strip 5  . acidophilus (RISAQUAD) CAPS capsule Take 1 capsule by mouth daily.    Marland Kitchen ALPRAZolam (XANAX) 0.5 MG tablet TAKE 1 TABLET(0.5 MG) BY MOUTH THREE TIMES DAILY AS NEEDED FOR ANXIETY 30 tablet 3  . amitriptyline (ELAVIL) 25 MG tablet Take 25 mg by mouth at bedtime.    Marland Kitchen aspirin EC 81 MG tablet Take 81 mg by mouth daily. Swallow whole.    Marland Kitchen atorvastatin (LIPITOR) 10 MG tablet Take 1 tablet (10 mg total) by mouth every 7 (seven) days. 12 tablet 0  . BELSOMRA 10 MG TABS TAKE 1 TABLET BY MOUTH EVERY NIGHT AT BEDTIME AS NEEDED (Patient taking differently: Take 10 mg by mouth at bedtime as needed (sleep).) 30 tablet 2  . Biotin w/ Vitamins C & E (HAIR/SKIN/NAILS PO) Take 1 tablet by mouth daily.    . Coenzyme Q10 (CO Q-10) 100 MG CAPS Take by mouth.    . Continuous Blood Gluc Receiver (FREESTYLE LIBRE 14 DAY READER) DEVI USE TO CHECK BLOOD SUGAR BEFORE MEALS AND AT BEDTIME 1 each 2  . Continuous  Blood Gluc Sensor (FREESTYLE LIBRE 14 DAY SENSOR) MISC USE TO MEASURE BLOOD GLUCOSE FOUR TIMES DAILY BEFORE MEALS AND AT BEDTIME 2 each 11  . cyclobenzaprine (FLEXERIL) 10 MG tablet Take 10 mg by mouth at bedtime as needed for muscle spasms.    Marland Kitchen esomeprazole (NEXIUM) 20 MG capsule TAKE 1 CAPSULE(20 MG) BY MOUTH DAILY 90 capsule 1  . Glucose Blood (GLUCOSE METER TEST VI) by In Vitro route 2 (two) times daily.    Marland Kitchen HYDROcodone-acetaminophen (NORCO/VICODIN) 5-325 MG tablet Take 1 tablet by mouth every 4 (four) hours as needed for moderate pain or severe pain. For AFTER surgery only, do not take and drive 10 tablet 0  . ibuprofen (ADVIL) 800 MG tablet Take 1 tablet (800 mg total) by mouth every 8 (eight) hours as needed for moderate pain. For AFTER surgery 30 tablet 0  . meloxicam (MOBIC) 15 MG tablet Take 15 mg by mouth daily as needed for pain.    . metFORMIN (GLUCOPHAGE) 500 MG tablet Take 1 tablet (500 mg total) by mouth daily with breakfast. TAKE 1 TABLET BY MOUTH EVERY DAY WITH BREAKFAST 90 tablet 1  . Multiple Vitamin (MULTIVITAMIN WITH MINERALS) TABS tablet Take 1 tablet by mouth daily.    . Omega-3 1000 MG CAPS Take 1,000 mg by mouth daily.    . Semaglutide (RYBELSUS) 14 MG TABS Take 1 tablet by mouth daily. Take 30 minutes before breakfast 90 tablet 0  . senna-docusate (SENOKOT-S) 8.6-50 MG tablet Take 2 tablets by mouth at bedtime. For AFTER surgery, do not take if having diarrhea 30 tablet 0  . tamoxifen (NOLVADEX) 20 MG tablet Take 20 mg by mouth daily.    . Turmeric (QC TUMERIC COMPLEX PO) Take 1,000 mg by mouth daily.    Marland Kitchen Zoster Vaccine Adjuvanted [facility]) injection Inject 0.5 mLs into the muscle once for 1 dose. 0.5 mL 0   No current facility-administered medications for this visit.    OBJECTIVE: African-American woman who appears younger than stated age  Vitals:   06/27/20 1302  BP: 121/69  Pulse: (!) 117  Resp: 18  Temp: (!) 97.5 F (36.4 C)  SpO2: 98%     Body mass  index is 29.81 kg/m.   Wt Readings from Last 3 Encounters:  06/27/20 184 lb 3.2 oz (83.6 kg)  06/27/20 184 lb 11.2 oz (83.8 kg)  06/12/20 179 lb (81.2 kg)     ECOG FS:2 - Symptomatic, <50% confined to bed  Sclerae unicteric, EOMs intact Wearing a mask No cervical or supraclavicular adenopathy Lungs no rales or rhonchi Heart regular rate and rhythm Abd soft, nontender, positive bowel sounds MSK no focal spinal tenderness, no upper extremity lymphedema Neuro: nonfocal, well oriented, appropriate affect Breasts: The right breast is status postlumpectomy and radiation.  The left breast is benign.  Both axillae are benign   LAB RESULTS:  CMP     Component Value Date/Time   NA 141 06/27/2020 1240   NA 142 02/28/2020 1049   K 3.8 06/27/2020 1240   CL 109 06/27/2020 1240   CO2 22 06/27/2020 1240   GLUCOSE 161 (H) 06/27/2020 1240   BUN 16 06/27/2020 1240   BUN 15 02/28/2020 1049   CREATININE 0.89 06/27/2020 1240   CALCIUM 9.1 06/27/2020 1240   PROT 7.0 06/27/2020 1240   PROT 7.2 07/26/2019 0931   ALBUMIN 3.9 06/27/2020 1240   ALBUMIN 4.5 07/26/2019 0931   AST 19 06/27/2020 1240   ALT 25 06/27/2020 1240   ALKPHOS 76 06/27/2020 1240   BILITOT 0.4 06/27/2020 1240   GFRNONAA >60 06/27/2020 1240   GFRAA 75 02/28/2020 1049   GFRAA >60 09/01/2019 1210    No results found for: TOTALPROTELP, ALBUMINELP, A1GS, A2GS, BETS, BETA2SER, GAMS, MSPIKE, SPEI  Lab Results  Component Value Date   WBC 4.0 06/27/2020   NEUTROABS 1.7 06/27/2020   HGB 12.9 06/27/2020   HCT 39.4 06/27/2020   MCV 84.0 06/27/2020   PLT 217 06/27/2020    No results found for: LABCA2  No components found for: WUXLKG401  No results for input(s): INR in the last 168 hours.  No results found for: LABCA2  No results found for: CAN199  Lab Results  Component Value Date   CAN125 35.5 02/07/2020    No results found for: UUV253  No results found for: CA2729  No components found for: HGQUANT  No  results found for: CEA1 / No results found for: CEA1   No results found for: AFPTUMOR  No results found for: CHROMOGRNA  No results found for: KPAFRELGTCHN, LAMBDASER, KAPLAMBRATIO (kappa/lambda light chains)  No results found for: HGBA, HGBA2QUANT, HGBFQUANT, HGBSQUAN (Hemoglobinopathy evaluation)   No results found for: LDH  No results found for: IRON, TIBC, IRONPCTSAT (Iron and TIBC)  No results found for: FERRITIN  Urinalysis    Component Value Date/Time   COLORURINE YELLOW 04/21/2020 1318   APPEARANCEUR CLEAR 04/21/2020 1318   LABSPEC 1.019 04/21/2020 1318   PHURINE 6.0 04/21/2020 1318   GLUCOSEU NEGATIVE 04/21/2020 1318   HGBUR NEGATIVE 04/21/2020 1318   BILIRUBINUR NEGATIVE 04/21/2020 1318   BILIRUBINUR negative 07/20/2018 1108   KETONESUR NEGATIVE 04/21/2020 1318   PROTEINUR NEGATIVE 04/21/2020 1318   UROBILINOGEN 0.2 07/20/2018 1108   NITRITE NEGATIVE 04/21/2020 1318   LEUKOCYTESUR SMALL (A) 04/21/2020 1318    STUDIES: No results found.   ELIGIBLE FOR AVAILABLE RESEARCH PROTOCOL: AET  ASSESSMENT: 55 y.o. [place] woman status post right breast biopsy 08/25/2019 for ductal carcinoma in situ, grade 2 or 3, estrogen and progesterone receptor positive  (1) genetics testing 09/09/2019 found a heterozygous pathogenic variant  in the BRCA2 gene called c.[id number] (p.Ser1982Argfs*22)  (a) no additional deleterious mutations were noted through the [facility] Breast Cancer STAT panel in ATM, BRCA1, BRCA2, CDH1, CHEK2, PALB2, PTEN, STK11 and TP53.  (2) right lumpectomy and  sentinel lymph node sampling 09/23/2019 showed ductal carcinoma in situ, largest section 0.5 cm, with negative margins: pTis pN0, stage 0  (a) a total of 2 axillary lymph nodes were removed  (3) adjuvant radiation completed 12/08/2019 (50.4 Gray to the right breast plus boost)  (4) tamoxifen started 12/08/2019  (5) bilateral salpingo-oophorectomy pendingS/p bilateral salpingo-oophorectomy  05/23/2020 with benign pathoogy  (6) intensified screening in follow-up    PLAN: Ciria will soon be a year out from definitive surgery for her breast cancer with no evidence of disease recurrence.  This is favorable.  She is tolerating tamoxifen well and the plan will be to continue that a minimum of 5 years.  That of course will cut her risk of developing a new breast cancer in half.  Normally in intensified screening we would obtain a breast MRI yearly in addition to breast mammography but we have not been able to arrange this for her for insurance/financial reasons.  Accordingly the plan is for yearly mammography and biannual breast exams.  She will see me in 6 months to accomplish that and in addition we discussed the difference between breast self-exam and breast self-awareness.  She knows to call for any other issue that may develop before the next visit  Total encounter time 20 minutes.Sarajane Jews C. Ireland Virrueta, MD 06/27/2020 5:21 PM Medical Oncology and Hematology [facility] [place], [place] [address] Tel. [phone]    Fax. [phone]   This document serves as a record of services personally performed by Lurline Del, MD. It was created on his behalf by Wilburn Mylar, a trained medical scribe. The creation of this record is based on the scribe's personal observations and the provider's statements to them.   I, Lurline Del MD, have reviewed the above documentation for accuracy and completeness, and I agree with the above.   *Total Encounter Time as defined by the Centers for Medicare and Medicaid Services includes, in addition to the face-to-face time of a patient visit (documented in the note above) non-face-to-face time: obtaining and reviewing outside history, ordering and reviewing medications, tests or procedures, care coordination (communications with other health care professionals or caregivers) and documentation in the  medical record.

## 2020-06-28 ENCOUNTER — Telehealth: Payer: Self-pay | Admitting: Oncology

## 2020-06-28 LAB — TSH: TSH: 0.58 u[IU]/mL (ref 0.450–4.500)

## 2020-06-28 NOTE — Telephone Encounter (Signed)
Scheduled appointment per 05/31 los. Left a detailed message.

## 2020-07-02 ENCOUNTER — Encounter: Payer: Self-pay | Admitting: Nurse Practitioner

## 2020-07-05 ENCOUNTER — Other Ambulatory Visit: Payer: Self-pay

## 2020-07-05 ENCOUNTER — Encounter: Payer: Self-pay | Admitting: Oncology

## 2020-07-05 ENCOUNTER — Telehealth: Payer: Self-pay

## 2020-07-05 ENCOUNTER — Encounter (HOSPITAL_COMMUNITY): Payer: Self-pay

## 2020-07-05 ENCOUNTER — Encounter: Payer: Self-pay | Admitting: Nurse Practitioner

## 2020-07-05 ENCOUNTER — Emergency Department (HOSPITAL_COMMUNITY): Payer: BC Managed Care – PPO

## 2020-07-05 ENCOUNTER — Emergency Department (HOSPITAL_COMMUNITY)
Admission: EM | Admit: 2020-07-05 | Discharge: 2020-07-05 | Disposition: A | Discharge status: Home / Self Care | Payer: BC Managed Care – PPO | Attending: Emergency Medicine | Admitting: Emergency Medicine

## 2020-07-05 DIAGNOSIS — K219 Gastro-esophageal reflux disease without esophagitis: Secondary | ICD-10-CM | POA: Diagnosis not present

## 2020-07-05 DIAGNOSIS — E119 Type 2 diabetes mellitus without complications: Secondary | ICD-10-CM | POA: Insufficient documentation

## 2020-07-05 DIAGNOSIS — K760 Fatty (change of) liver, not elsewhere classified: Secondary | ICD-10-CM | POA: Diagnosis not present

## 2020-07-05 DIAGNOSIS — Z7984 Long term (current) use of oral hypoglycemic drugs: Secondary | ICD-10-CM | POA: Insufficient documentation

## 2020-07-05 DIAGNOSIS — R112 Nausea with vomiting, unspecified: Secondary | ICD-10-CM | POA: Insufficient documentation

## 2020-07-05 DIAGNOSIS — Z853 Personal history of malignant neoplasm of breast: Secondary | ICD-10-CM | POA: Diagnosis not present

## 2020-07-05 DIAGNOSIS — K922 Gastrointestinal hemorrhage, unspecified: Secondary | ICD-10-CM | POA: Insufficient documentation

## 2020-07-05 DIAGNOSIS — I700 Atherosclerosis of aorta: Secondary | ICD-10-CM | POA: Diagnosis not present

## 2020-07-05 DIAGNOSIS — D1803 Hemangioma of intra-abdominal structures: Secondary | ICD-10-CM | POA: Diagnosis not present

## 2020-07-05 DIAGNOSIS — R109 Unspecified abdominal pain: Secondary | ICD-10-CM | POA: Diagnosis not present

## 2020-07-05 DIAGNOSIS — R110 Nausea: Secondary | ICD-10-CM | POA: Diagnosis not present

## 2020-07-05 LAB — CBC
HCT: 36.9 % (ref 36.0–46.0)
Hemoglobin: 12.1 g/dL (ref 12.0–15.0)
MCH: 27.4 pg (ref 26.0–34.0)
MCHC: 32.8 g/dL (ref 30.0–36.0)
MCV: 83.5 fL (ref 80.0–100.0)
Platelets: 230 10*3/uL (ref 150–400)
RBC: 4.42 MIL/uL (ref 3.87–5.11)
RDW: 13.1 % (ref 11.5–15.5)
WBC: 4.3 10*3/uL (ref 4.0–10.5)
nRBC: 0 % (ref 0.0–0.2)

## 2020-07-05 LAB — COMPREHENSIVE METABOLIC PANEL
ALT: 42 U/L (ref 0–44)
AST: 27 U/L (ref 15–41)
Albumin: 4.2 g/dL (ref 3.5–5.0)
Alkaline Phosphatase: 63 U/L (ref 38–126)
Anion gap: 9 (ref 5–15)
BUN: 15 mg/dL (ref 6–20)
CO2: 25 mmol/L (ref 22–32)
Calcium: 9.4 mg/dL (ref 8.9–10.3)
Chloride: 106 mmol/L (ref 98–111)
Creatinine, Ser: 0.87 mg/dL (ref 0.44–1.00)
GFR, Estimated: >60 mL/min (ref >60)
Glucose, Bld: 127 mg/dL — ABNORMAL HIGH (ref 70–99)
Potassium: 3.6 mmol/L (ref 3.5–5.1)
Sodium: 140 mmol/L (ref 135–145)
Total Bilirubin: 0.3 mg/dL (ref 0.3–1.2)
Total Protein: 7.5 g/dL (ref 6.5–8.1)

## 2020-07-05 LAB — LIPASE, BLOOD: Lipase: 44 U/L (ref 11–51)

## 2020-07-05 IMAGING — CT CT ABD-PELV W/O CM
2 of 4 series · 17 of 46 positions shown, 19 images · non-contrast
Comparison: [DATE]

CLINICAL DATA: Abdominal abscess or infection suspected. Abdominal
pain with rectal bleeding and intermittent nausea since [DATE].

EXAM:
CT ABDOMEN AND PELVIS WITHOUT CONTRAST
TECHNIQUE: Multidetector CT imaging of the abdomen and pelvis was performed
following the standard protocol without IV contrast.

[Series 2: axial st · axial · 0.69mm/px · z∈[+1101,+1476]mm · 14 of 85 slices shown, 16 images]
[im 5/85  soft-tissue]
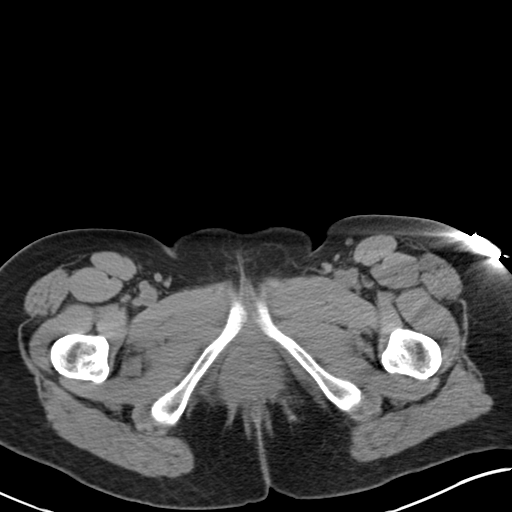
[im 5/85  bone]
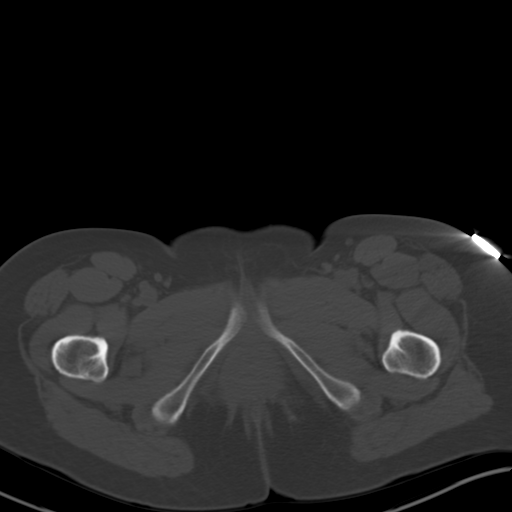
[im 13/85  soft-tissue]
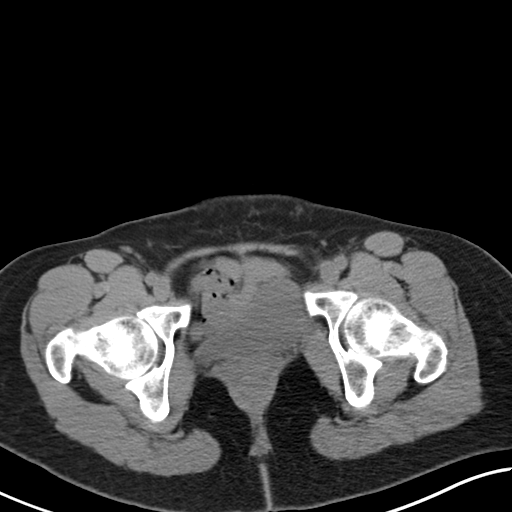
[im 17/85  soft-tissue]
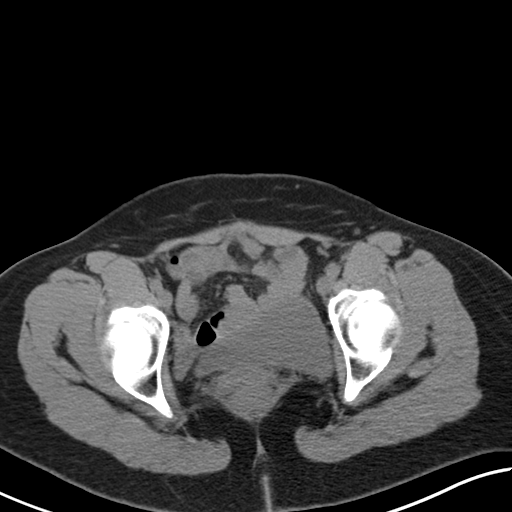
[im 22/85  soft-tissue]
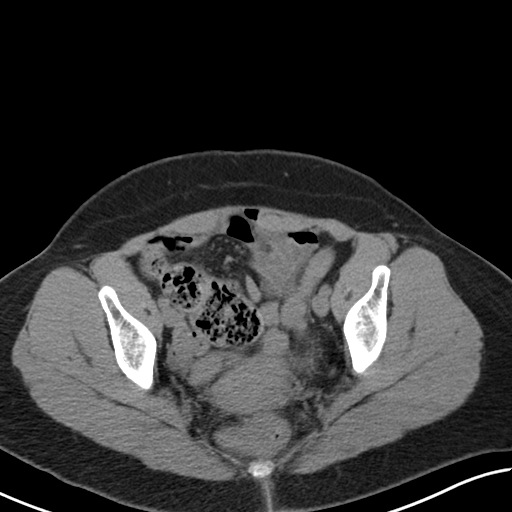
[im 30/85  soft-tissue]
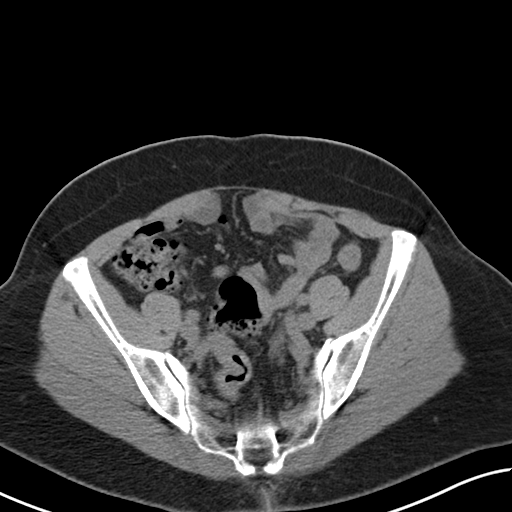
[im 34/85  soft-tissue]
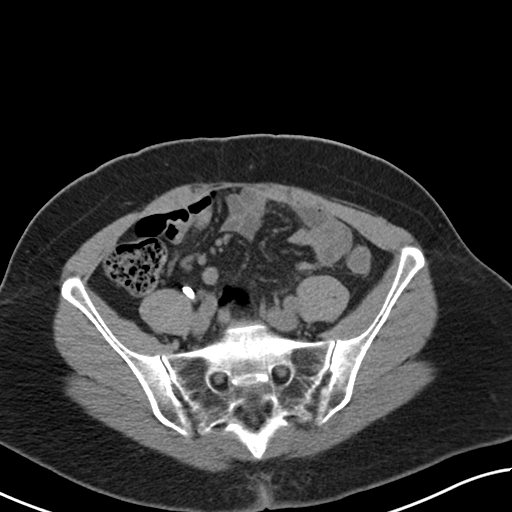
[im 38/85  soft-tissue]
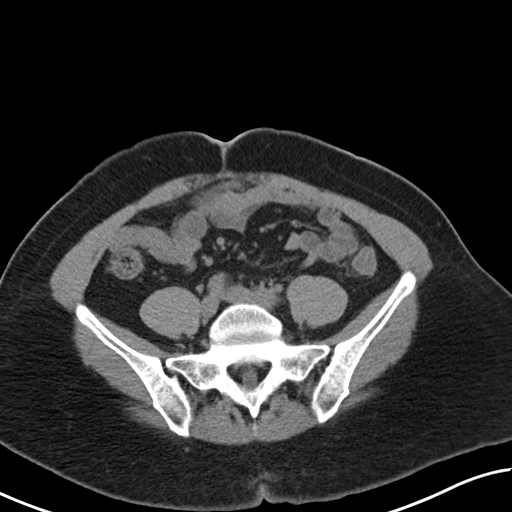
[im 47/85  soft-tissue]
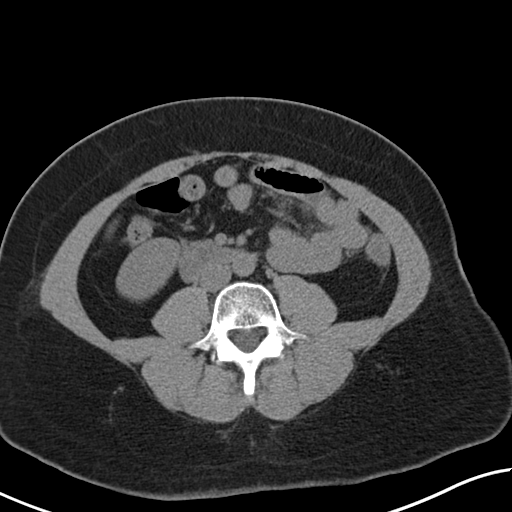
[im 51/85  soft-tissue]
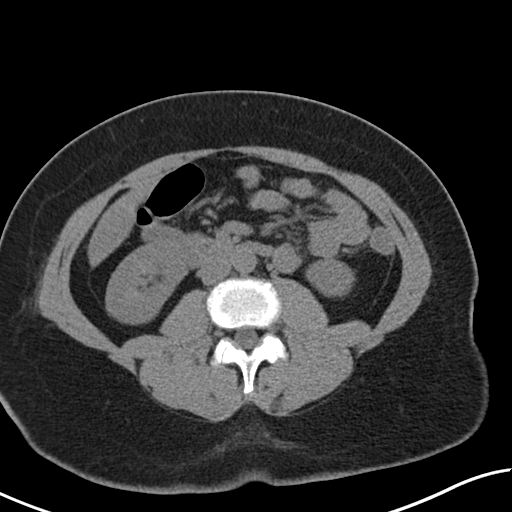
[im 51/85  bone]
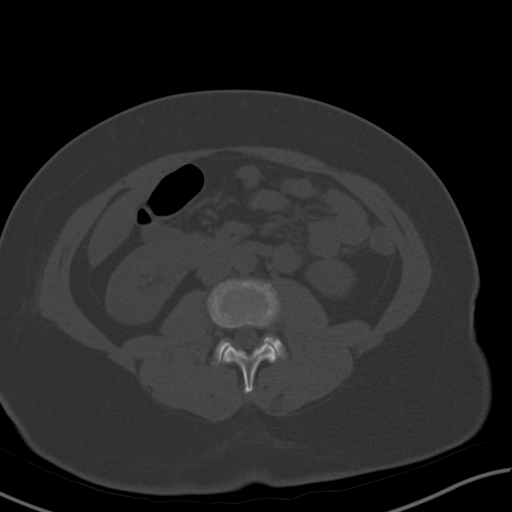
[im 55/85  soft-tissue]
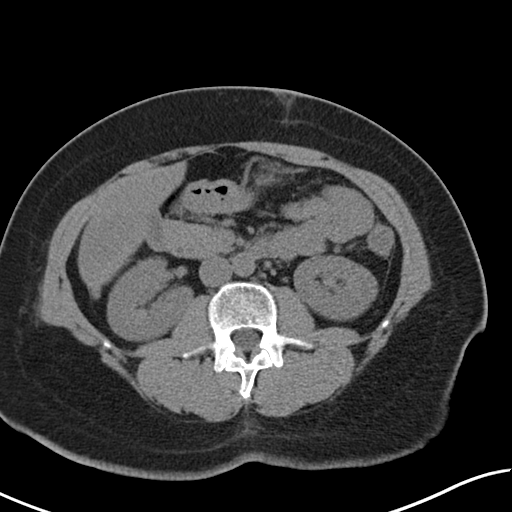
[im 64/85  soft-tissue]
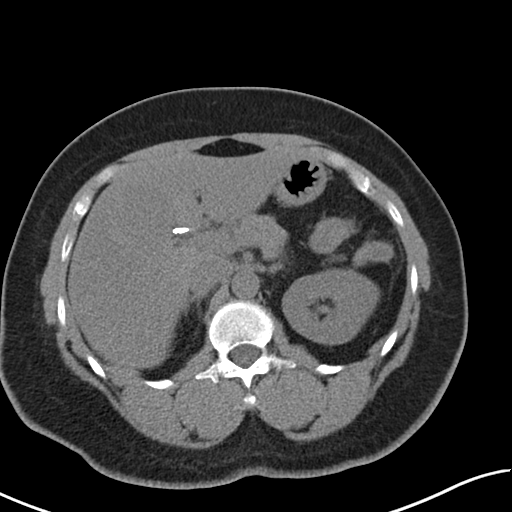
[im 68/85  soft-tissue]
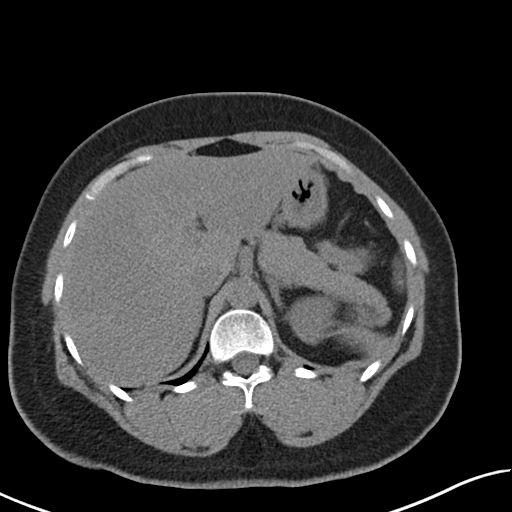
[im 72/85  soft-tissue]
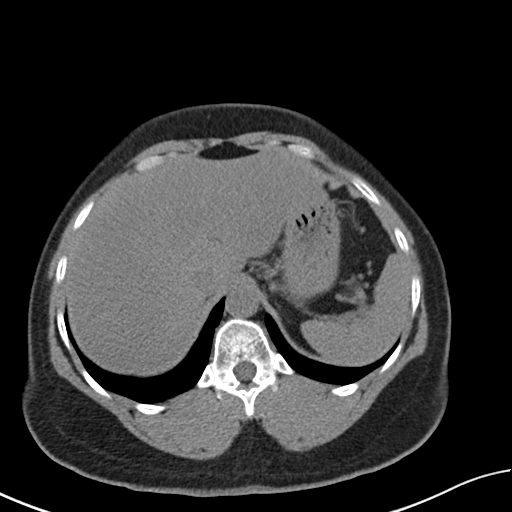
[im 80/85  soft-tissue]
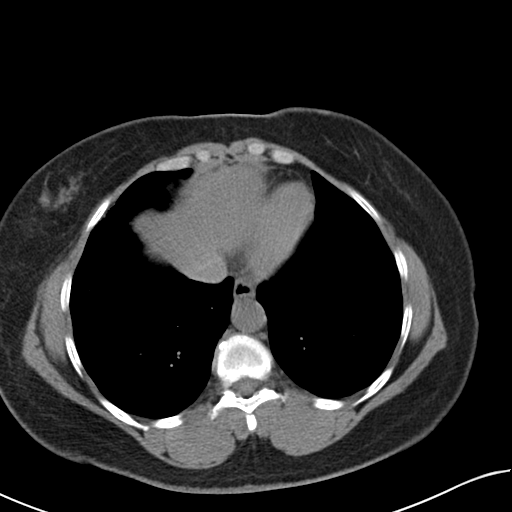

[Series 4: coronal st · coronal · 0.74mm/px · 3 of 138 slices shown]
[im 46/138  soft-tissue]
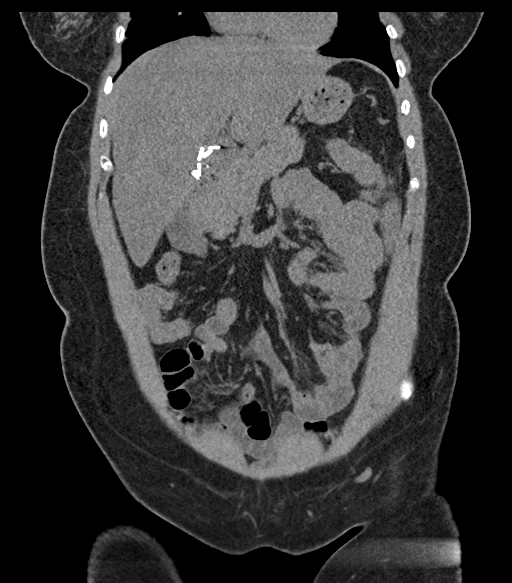
[im 61/138  soft-tissue]
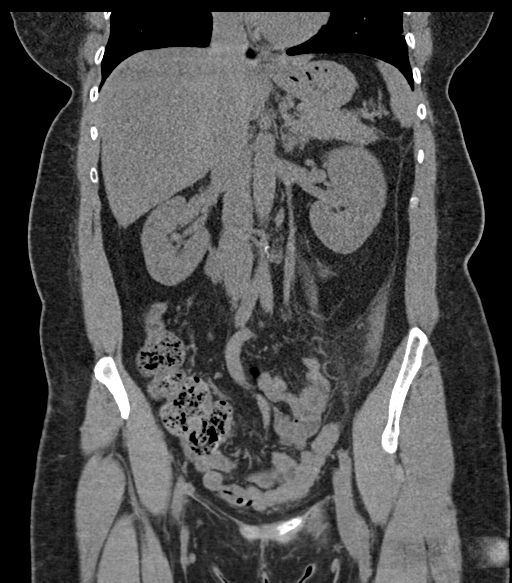
[im 77/138  soft-tissue]
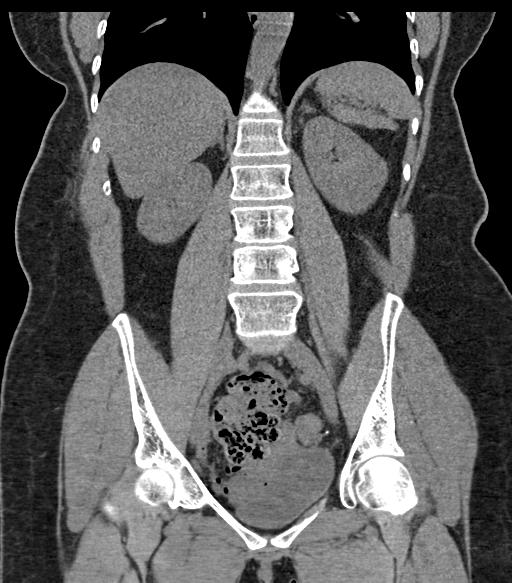

[17 of 46 positions shown; findings below may reference images not displayed]

FINDINGS: Lower chest:  Mild subpleural scarring in the anterior right lung.

Hepatobiliary: Hepatic steatosis. Mass in the lower central liver
which was previously characterized as hemangioma by multiphase CT, 4
cm in diameter today. No evidence of hemorrhage.Cholecystectomy.

Pancreas: Unremarkable.

Spleen: Unremarkable.

Adrenals/Urinary Tract: Negative adrenals. No hydronephrosis or
stone. Unremarkable bladder.

Stomach/Bowel: No obstruction. Appendectomy. Borderline low-density
descending colon but no thickening or convincing mesenteric edema.

Vascular/Lymphatic: No acute vascular abnormality. Mild atheromatous
calcification of the aorta. No mass or adenopathy.

Reproductive:No pathologic findings.

Other: No ascites or pneumoperitoneum.

Musculoskeletal: No acute abnormalities.
IMPRESSION: 1. No acute finding.
2. Hepatic steatosis.
3. Hepatic hemangioma without complicating features.

## 2020-07-05 MED ORDER — DICYCLOMINE HCL 20 MG PO TABS: 20.0000 mg | ORAL_TABLET | Freq: Two times a day (BID) | ORAL | 0 refills | Status: DC

## 2020-07-05 NOTE — Discharge Instructions (Addendum)
Results for orders placed or performed during the hospital encounter of 07/05/20  Lipase, blood  Result Value Ref Range   Lipase 44 11 - 51 U/L  Comprehensive metabolic panel  Result Value Ref Range   Sodium 140 135 - 145 mmol/L   Potassium 3.6 3.5 - 5.1 mmol/L   Chloride 106 98 - 111 mmol/L   CO2 25 22 - 32 mmol/L   Glucose, Bld 127 (H) 70 - 99 mg/dL   BUN 15 6 - 20 mg/dL   Creatinine, Ser 0.87 0.44 - 1.00 mg/dL   Calcium 9.4 8.9 - 10.3 mg/dL   Total Protein 7.5 6.5 - 8.1 g/dL   Albumin 4.2 3.5 - 5.0 g/dL   AST 27 15 - 41 U/L   ALT 42 0 - 44 U/L   Alkaline Phosphatase 63 38 - 126 U/L   Total Bilirubin 0.3 0.3 - 1.2 mg/dL   GFR, Estimated >60 >60 mL/min   Anion gap 9 5 - 15  CBC  Result Value Ref Range   WBC 4.3 4.0 - 10.5 K/uL   RBC 4.42 3.87 - 5.11 MIL/uL   Hemoglobin 12.1 12.0 - 15.0 g/dL   HCT 36.9 36.0 - 46.0 %   MCV 83.5 80.0 - 100.0 fL   MCH 27.4 26.0 - 34.0 pg   MCHC 32.8 30.0 - 36.0 g/dL   RDW 13.1 11.5 - 15.5 %   Platelets 230 150 - 400 K/uL   nRBC 0.0 0.0 - 0.2 %  CT ABDOMEN PELVIS WO CONTRAST  Result Date: 07/05/2020 CLINICAL DATA:  Abdominal abscess or infection suspected. Abdominal pain with rectal bleeding and intermittent nausea since 07/01/2020. EXAM: CT ABDOMEN AND PELVIS WITHOUT CONTRAST TECHNIQUE: Multidetector CT imaging of the abdomen and pelvis was performed following the standard protocol without IV contrast. COMPARISON:  12/09/2017 FINDINGS: Lower chest:  Mild subpleural scarring in the anterior right lung. Hepatobiliary: Hepatic steatosis. Mass in the lower central liver which was previously characterized as hemangioma by multiphase CT, 4 cm in diameter today. No evidence of hemorrhage.Cholecystectomy. Pancreas: Unremarkable. Spleen: Unremarkable. Adrenals/Urinary Tract: Negative adrenals. No hydronephrosis or stone. Unremarkable bladder. Stomach/Bowel: No obstruction. Appendectomy. Borderline low-density descending colon but no thickening or convincing  mesenteric edema. Vascular/Lymphatic: No acute vascular abnormality. Mild atheromatous calcification of the aorta. No mass or adenopathy. Reproductive:No pathologic findings. Other: No ascites or pneumoperitoneum. Musculoskeletal: No acute abnormalities. IMPRESSION: 1. No acute finding. 2. Hepatic steatosis. 3. Hepatic hemangioma without complicating features. Electronically Signed   By: Monte Fantasia M.D.   On: 07/05/2020 04:50    Follow up with Dr. Collene Mares for further evaluation of lower GI bleeding.

## 2020-07-05 NOTE — ED Triage Notes (Signed)
Pt with abd pain and rectal bleeding that started on 6/4. Pt initially had vomiting on the first day, now pt just has intermittent nausea.

## 2020-07-05 NOTE — Telephone Encounter (Signed)
Transition Care Management Follow-up Telephone Call  Date of discharge and from where: 07/05/20  How have you been since you were released from the hospital? TIRED  Any questions or concerns? NO  Items Reviewed:  Did the pt receive and understand the discharge instructions provided? Yes   Medications obtained and verified? Yes   Other? Yes   Any new allergies since your discharge? No   Dietary orders reviewed? No  Do you have support at home? No   Home Care and Equipment/Supplies: Were home health services ordered? no If so, what is the name of the agency? NA  Has the agency set up a time to come to the patient's home? not applicable Were any new equipment or medical supplies ordered?  No What is the name of the medical supply agency? NA Were you able to get the supplies/equipment? not applicable Do you have any questions related to the use of the equipment or supplies? No  Functional Questionnaire: (I = Independent and D = Dependent) ADLs: I  Bathing/Dressing- I  Meal Prep- I  Eating- I  Maintaining continence- I  Transferring/Ambulation- I  Managing Meds- I  Follow up appointments reviewed:   PCP Hospital f/u appt confirmed? No  PT DOES NOT WANT TO BE SEEN UNTIL 07/27/20 AT [facility] f/u appt confirmed? Yes  DOES NOT KNOW  Are transportation arrangements needed? No   If their condition worsens, is the pt aware to call PCP or go to the Emergency Dept.? Yes  Was the patient provided with contact information for the PCP's office or ED? Yes  Was to pt encouraged to call back with questions or concerns? Yes

## 2020-07-05 NOTE — ED Provider Notes (Signed)
MSE was initiated and I personally evaluated the patient and placed orders (if any) at  1:09 AM on July 05, 2020.  Abdominal pain x 5 days after possibly drinking mild accidentally. Subsequently she started seeing BRB in her stool. No melena. Not anticoagulated. No fever. Nausea with vomiting on Day 1, intermittent nausea since. GI - Dr. Collene Mares.  Today's Vitals   07/05/20 0040 07/05/20 0042  BP: (!) 134/98   Pulse: 97   Resp: 16   Temp: 98.4 F (36.9 C)   TempSrc: Oral   SpO2: 100%   Weight:  83.5 kg  Height:  5\' 6"  (1.676 m)  PainSc:  4    Body mass index is 29.7 kg/m.  VSS Abd soft, periumbilical tenderness.  The patient appears stable so that the remainder of the MSE may be completed by another provider.   Charlann Lange, PA-C 07/07/20 0737    Shanon Rosser, MD 07/07/20 2232

## 2020-07-07 NOTE — ED Provider Notes (Signed)
[facility] DEPT Provider Note   CSN: [id number] Arrival date & time: 07/05/20  0019     History Chief Complaint  Patient presents with   Abdominal Pain    Vanessa Allen is a 55 y.o. female.  Patient with history of lactose intolerance, breast cancer, GERD, presents with abdominal pain x 5 days after possibly drinking milk accidentally. After pain started she started seeing BRB in her stool. No melena. She is not taking anticoagulants. No fever. Nausea with vomiting on Day 1, intermittent nausea since. GI - Dr. Collene Mares.   The history is provided by the patient. No language interpreter was used.  Abdominal Pain Associated symptoms: nausea   Associated symptoms: no constipation and no fever       Past Medical History:  Diagnosis Date   Arthritis    knees and back    BRCA2 gene mutation positive in female    Cancer [facility])    Right Breast Cancer   Complication of anesthesia    Diabetes mellitus without complication ([place])    type 2    Endometriosis    Family history of adverse reaction to anesthesia    brother has issues with n/v after surgery    Family history of BRCA gene mutation    Family history of breast cancer    Family history of leukemia    Family history of ovarian cancer    Family history of stomach cancer    Fibroid uterus    GERD (gastroesophageal reflux disease)    Personal history of radiation therapy    PONV (postoperative nausea and vomiting)    Venous insufficiency of lower extremity     Patient Active Problem List   Diagnosis Date Noted   Endometriosis    PVD (peripheral vascular disease) ([place]) 02/28/2020   Breast cancer, BRCA2 positive, right ([place]) 12/08/2019   Central sleep apnea 09/20/2019   Genetic testing 09/10/2019   BRCA2 gene mutation positive in female 09/10/2019   Family history of BRCA gene mutation    Family history of breast cancer    Family history of ovarian cancer    Family history of stomach cancer     Family history of leukemia    Ductal carcinoma in situ (DCIS) of right breast 08/31/2019   Chronic insomnia 06/08/2019   Atypical chest pain 07/20/2018   Chest discomfort 05/21/2018   Anxiety 05/21/2018   Snoring 10/27/2017   Episodic circadian rhythm sleep disorder, shift work type 10/27/2017   Insomnia 10/27/2017   Type 2 diabetes mellitus without complication, without long-term current use of insulin ([place]) 10/27/2017    Past Surgical History:  Procedure Laterality Date   APPENDECTOMY     BREAST BIOPSY Right 08/25/2019   BREAST LUMPECTOMY Right 09/23/2019   BREAST LUMPECTOMY WITH RADIOACTIVE SEED AND SENTINEL LYMPH NODE BIOPSY Right 09/23/2019   Procedure: RIGHT BREAST LUMPECTOMY WITH RADIOACTIVE SEED AND SENTINEL LYMPH NODE MAPPING;  Surgeon: Erroll Luna, MD;  Location: [place];  Service: General;  Laterality: Right;  PEC BLOCK   CHOLECYSTECTOMY     lsc chole cystectomy and appendectomy 1 month after myomectomy   CYST EXCISION Left 12/13/2019   Procedure: Excision left dorsal wrist ganglion cyst;  Surgeon: Cindra Presume, MD;  Location: [place];  Service: Plastics;  Laterality: Left;  45 min   ENDOVENOUS ABLATION SAPHENOUS VEIN W/ LASER Left 12/30/2019   endovenous laser ablation left greater saphenous vein by Gae Gallop MD    Lipoma removal Left  Left Lower Back   MYOMECTOMY     ROBOTIC ASSISTED SALPINGO OOPHERECTOMY Bilateral 05/23/2020   Procedure: XI ROBOTIC ASSISTED SALPINGO OOPHORECTOMY ;ENTEROLYSIS;  Surgeon: Lafonda Mosses, MD;  Location: [facility];  Service: Gynecology;  Laterality: Bilateral;   WISDOM TOOTH EXTRACTION       OB History     Gravida  0   Para  0   Term  0   Preterm  0   AB  0   Living  0      SAB  0   IAB  0   Ectopic  0   Multiple  0   Live Births  0           Family History  Problem Relation Age of Onset   Hyperlipidemia Mother    Hypertension Mother    Varicose Veins Mother    Diabetes  Father    Heart disease Father    Stomach cancer Father 32   Hypertension Brother    Leukemia Paternal Aunt        dx. >50   Breast cancer Niece 22   BRCA 1/2 Niece    Ovarian cancer Cousin        d. <50 (maternal first cousin)   Ovarian cancer Cousin        d. <50 (maternal first cousin)   Ovarian cancer Cousin        d. late 31s (maternal first cousin)   Colon cancer Neg Hx    Pancreatic cancer Neg Hx    Prostate cancer Neg Hx     Social History   Tobacco Use   Smoking status: Never   Smokeless tobacco: Never  Vaping Use   Vaping Use: Never used  Substance Use Topics   Alcohol use: Yes    Alcohol/week: 0.0 standard drinks    Comment: rare    Drug use: Never    Home Medications Prior to Admission medications   Medication Sig Start Date End Date Taking? Authorizing Provider  dicyclomine (BENTYL) 20 MG tablet Take 1 tablet (20 mg total) by mouth 2 (two) times daily. 07/05/20  Yes Charlann Lange, PA-C  ACCU-CHEK GUIDE test strip USE TWICE DAILY TO CHECK BLOOD SUGAR BEFORE BREAKFAST AND DINNER 04/07/20   [name], FNP  acidophilus (RISAQUAD) CAPS capsule Take 1 capsule by mouth daily.    [provider]  ALPRAZolam (XANAX) 0.5 MG tablet TAKE 1 TABLET(0.5 MG) BY MOUTH THREE TIMES DAILY AS NEEDED FOR ANXIETY 04/07/20   [name], FNP  amitriptyline (ELAVIL) 25 MG tablet Take 25 mg by mouth at bedtime.    [provider]  aspirin EC 81 MG tablet Take 81 mg by mouth daily. Swallow whole.    [provider]  atorvastatin (LIPITOR) 10 MG tablet Take 1 tablet (10 mg total) by mouth every 7 (seven) days. 06/27/20   [name], FNP  BELSOMRA 10 MG TABS TAKE 1 TABLET BY MOUTH EVERY NIGHT AT BEDTIME AS NEEDED Patient taking differently: Take 10 mg by mouth at bedtime as needed (sleep). 01/11/20   [name], FNP  Biotin w/ Vitamins C & E (HAIR/SKIN/NAILS PO) Take 1 tablet by mouth daily.    [provider]  Coenzyme Q10 (CO Q-10) 100 MG  CAPS Take by mouth.    [provider]  Continuous Blood Gluc Receiver (FREESTYLE LIBRE 14 DAY READER) DEVI USE TO CHECK BLOOD SUGAR BEFORE MEALS AND AT BEDTIME 08/30/19   [name], FNP  Continuous  Blood Gluc Sensor (FREESTYLE LIBRE 14 DAY SENSOR) MISC USE TO MEASURE BLOOD GLUCOSE FOUR TIMES DAILY BEFORE MEALS AND AT BEDTIME 09/08/19   [name], FNP  cyclobenzaprine (FLEXERIL) 10 MG tablet Take 10 mg by mouth at bedtime as needed for muscle spasms. 08/20/19   [provider]  esomeprazole (NEXIUM) 20 MG capsule TAKE 1 CAPSULE(20 MG) BY MOUTH DAILY 04/24/20   [name], FNP  gabapentin (NEURONTIN) 300 MG capsule Take 1 capsule (300 mg total) by mouth at bedtime. 06/27/20   Magrinat, Virgie Dad, MD  Glucose Blood (GLUCOSE METER TEST VI) by In Vitro route 2 (two) times daily.    [provider]  HYDROcodone-acetaminophen (NORCO/VICODIN) 5-325 MG tablet Take 1 tablet by mouth every 4 (four) hours as needed for moderate pain or severe pain. For AFTER surgery only, do not take and drive 10/02/15   Cross, Lenna Sciara D, NP  ibuprofen (ADVIL) 800 MG tablet Take 1 tablet (800 mg total) by mouth every 8 (eight) hours as needed for moderate pain. For AFTER surgery 05/03/20   Joylene John D, NP  meloxicam (MOBIC) 15 MG tablet Take 15 mg by mouth daily as needed for pain. 08/20/19   [provider]  metFORMIN (GLUCOPHAGE) 500 MG tablet Take 1 tablet (500 mg total) by mouth daily with breakfast. TAKE 1 TABLET BY MOUTH EVERY DAY WITH BREAKFAST 05/22/20   [name], FNP  Multiple Vitamin (MULTIVITAMIN WITH MINERALS) TABS tablet Take 1 tablet by mouth daily.    [provider]  Omega-3 1000 MG CAPS Take 1,000 mg by mouth daily.    [provider]  Semaglutide (RYBELSUS) 14 MG TABS Take 1 tablet by mouth daily. Take 30 minutes before breakfast 06/12/20   [name], FNP  senna-docusate (SENOKOT-S) 8.6-50 MG tablet Take 2 tablets by mouth at bedtime. For AFTER  surgery, do not take if having diarrhea 05/03/20   Joylene John D, NP  tamoxifen (NOLVADEX) 20 MG tablet Take 20 mg by mouth daily.    [provider]  Turmeric (QC TUMERIC COMPLEX PO) Take 1,000 mg by mouth daily.    [provider]    Allergies    Patient has no known allergies.  Review of Systems   Review of Systems  Constitutional:  Negative for fever.  HENT: Negative.    Respiratory: Negative.    Cardiovascular: Negative.   Gastrointestinal:  Positive for abdominal pain, blood in stool and nausea. Negative for constipation.  Genitourinary:  Negative for decreased urine volume.  Musculoskeletal:  Negative for myalgias.  Neurological:  Negative for weakness and light-headedness.   Physical Exam Updated Vital Signs BP (!) 142/87   Pulse 88   Temp 98.8 F (37.1 C) (Oral)   Resp 16   Ht '5\' 6"'  (1.676 m)   Wt 83.5 kg   SpO2 100%   BMI 29.70 kg/m   Physical Exam Constitutional:      General: She is not in acute distress.    Appearance: She is well-developed.  Cardiovascular:     Rate and Rhythm: Normal rate.  Pulmonary:     Effort: Pulmonary effort is normal.  Abdominal:     General: Bowel sounds are normal. There is no distension.     Palpations: Abdomen is soft.     Tenderness: There is generalized abdominal tenderness (Mild). There is no guarding or rebound.  Musculoskeletal:     Cervical back: Normal range of motion.  Skin:    General: Skin is warm and  dry.  Neurological:     Mental Status: She is alert and oriented to person, place, and time.    ED Results / Procedures / Treatments   Labs (all labs ordered are listed, but only abnormal results are displayed) Labs Reviewed  COMPREHENSIVE METABOLIC PANEL - Abnormal; Notable for the following components:      Result Value   Glucose, Bld 127 (*)    All other components within normal limits  LIPASE, BLOOD  CBC   Results for orders placed or performed during the hospital encounter of  07/05/20  Lipase, blood  Result Value Ref Range   Lipase 44 11 - 51 U/L  Comprehensive metabolic panel  Result Value Ref Range   Sodium 140 135 - 145 mmol/L   Potassium 3.6 3.5 - 5.1 mmol/L   Chloride 106 98 - 111 mmol/L   CO2 25 22 - 32 mmol/L   Glucose, Bld 127 (H) 70 - 99 mg/dL   BUN 15 6 - 20 mg/dL   Creatinine, Ser 0.87 0.44 - 1.00 mg/dL   Calcium 9.4 8.9 - 10.3 mg/dL   Total Protein 7.5 6.5 - 8.1 g/dL   Albumin 4.2 3.5 - 5.0 g/dL   AST 27 15 - 41 U/L   ALT 42 0 - 44 U/L   Alkaline Phosphatase 63 38 - 126 U/L   Total Bilirubin 0.3 0.3 - 1.2 mg/dL   GFR, Estimated >60 >60 mL/min   Anion gap 9 5 - 15  CBC  Result Value Ref Range   WBC 4.3 4.0 - 10.5 K/uL   RBC 4.42 3.87 - 5.11 MIL/uL   Hemoglobin 12.1 12.0 - 15.0 g/dL   HCT 36.9 36.0 - 46.0 %   MCV 83.5 80.0 - 100.0 fL   MCH 27.4 26.0 - 34.0 pg   MCHC 32.8 30.0 - 36.0 g/dL   RDW 13.1 11.5 - 15.5 %   Platelets 230 150 - 400 K/uL   nRBC 0.0 0.0 - 0.2 %    EKG None  Radiology No results found. CT ABDOMEN PELVIS WO CONTRAST  Result Date: 07/05/2020 CLINICAL DATA:  Abdominal abscess or infection suspected. Abdominal pain with rectal bleeding and intermittent nausea since 07/01/2020. EXAM: CT ABDOMEN AND PELVIS WITHOUT CONTRAST TECHNIQUE: Multidetector CT imaging of the abdomen and pelvis was performed following the standard protocol without IV contrast. COMPARISON:  12/09/2017 FINDINGS: Lower chest:  Mild subpleural scarring in the anterior right lung. Hepatobiliary: Hepatic steatosis. Mass in the lower central liver which was previously characterized as hemangioma by multiphase CT, 4 cm in diameter today. No evidence of hemorrhage.Cholecystectomy. Pancreas: Unremarkable. Spleen: Unremarkable. Adrenals/Urinary Tract: Negative adrenals. No hydronephrosis or stone. Unremarkable bladder. Stomach/Bowel: No obstruction. Appendectomy. Borderline low-density descending colon but no thickening or convincing mesenteric edema.  Vascular/Lymphatic: No acute vascular abnormality. Mild atheromatous calcification of the aorta. No mass or adenopathy. Reproductive:No pathologic findings. Other: No ascites or pneumoperitoneum. Musculoskeletal: No acute abnormalities. IMPRESSION: 1. No acute finding. 2. Hepatic steatosis. 3. Hepatic hemangioma without complicating features. Electronically Signed   By: Monte Fantasia M.D.   On: 07/05/2020 04:50    Procedures Procedures   Medications Ordered in ED Medications - No data to display  ED Course  I have reviewed the triage vital signs and the nursing notes.  Pertinent labs & imaging results that were available during my care of the patient were reviewed by me and considered in my medical decision making (see chart for details).    MDM Rules/Calculators/A&P  Patient to ED with nausea, generalized abdominal pain, bloody stool without melena x 5 days. No fever. H/o lactose intolerance.  The patient is very well appearing. VSS, afebrile. No leukocytosis. No anemia. CT abd/pel unremarkable.   The patient was seen in triage. She is able to provide a photo of a bloody bowel movement. Rectal exam deferred due to triage exam.   Labs and imaging are resulted and reviewed with the patient. She has GI follow up with established doctor. She is in NAD and is comfortable with discharge from triage. She is felt reliable to follow up and to return with any signs of hemodynamic instability.   Will start Bentyl which she states has helped with past episodes.   Final Clinical Impression(s) / ED Diagnoses Final diagnoses:  Lower GI bleeding    Rx / DC Orders ED Discharge Orders          Ordered    dicyclomine (BENTYL) 20 MG tablet  2 times daily        07/05/20 0541             Charlann Lange, PA-C 07/07/20 0534    Molpus, Jenny Reichmann, MD 07/07/20 [phone]

## 2020-07-10 ENCOUNTER — Encounter: Payer: Self-pay | Admitting: Nurse Practitioner

## 2020-07-12 ENCOUNTER — Other Ambulatory Visit: Payer: Self-pay | Admitting: Nurse Practitioner

## 2020-07-12 DIAGNOSIS — Z23 Encounter for immunization: Secondary | ICD-10-CM

## 2020-07-12 MED ORDER — SHINGRIX 50 MCG/0.5ML IM SUSR: 0.5000 mL | Freq: Once | INTRAMUSCULAR | 0 refills | Status: AC

## 2020-07-13 DIAGNOSIS — K219 Gastro-esophageal reflux disease without esophagitis: Secondary | ICD-10-CM | POA: Diagnosis not present

## 2020-07-13 DIAGNOSIS — Z853 Personal history of malignant neoplasm of breast: Secondary | ICD-10-CM | POA: Diagnosis not present

## 2020-07-13 DIAGNOSIS — Z1211 Encounter for screening for malignant neoplasm of colon: Secondary | ICD-10-CM | POA: Diagnosis not present

## 2020-07-13 DIAGNOSIS — K625 Hemorrhage of anus and rectum: Secondary | ICD-10-CM | POA: Diagnosis not present

## 2020-07-15 DIAGNOSIS — G4733 Obstructive sleep apnea (adult) (pediatric): Secondary | ICD-10-CM | POA: Diagnosis not present

## 2020-07-24 DIAGNOSIS — K625 Hemorrhage of anus and rectum: Secondary | ICD-10-CM | POA: Diagnosis not present

## 2020-07-24 DIAGNOSIS — Z1211 Encounter for screening for malignant neoplasm of colon: Secondary | ICD-10-CM | POA: Diagnosis not present

## 2020-07-24 LAB — HM COLONOSCOPY

## 2020-07-25 ENCOUNTER — Encounter: Payer: Self-pay | Admitting: Nurse Practitioner

## 2020-07-26 ENCOUNTER — Encounter: Payer: Self-pay | Admitting: Nurse Practitioner

## 2020-07-27 ENCOUNTER — Encounter: Payer: Self-pay | Admitting: Nurse Practitioner

## 2020-07-27 ENCOUNTER — Ambulatory Visit (INDEPENDENT_AMBULATORY_CARE_PROVIDER_SITE_OTHER): Payer: BC Managed Care – PPO | Admitting: Nurse Practitioner

## 2020-07-27 ENCOUNTER — Other Ambulatory Visit: Payer: Self-pay

## 2020-07-27 VITALS — BP 122/86 | HR 96 | Temp 98.5°F | Ht 65.6 in | Wt 178.2 lb

## 2020-07-27 DIAGNOSIS — Z79899 Other long term (current) drug therapy: Secondary | ICD-10-CM | POA: Diagnosis not present

## 2020-07-27 DIAGNOSIS — E119 Type 2 diabetes mellitus without complications: Secondary | ICD-10-CM

## 2020-07-27 DIAGNOSIS — Z23 Encounter for immunization: Secondary | ICD-10-CM

## 2020-07-27 DIAGNOSIS — C50911 Malignant neoplasm of unspecified site of right female breast: Secondary | ICD-10-CM | POA: Diagnosis not present

## 2020-07-27 DIAGNOSIS — Z0000 Encounter for general adult medical examination without abnormal findings: Secondary | ICD-10-CM | POA: Diagnosis not present

## 2020-07-27 DIAGNOSIS — I739 Peripheral vascular disease, unspecified: Secondary | ICD-10-CM

## 2020-07-27 DIAGNOSIS — R000 Tachycardia, unspecified: Secondary | ICD-10-CM

## 2020-07-27 DIAGNOSIS — I700 Atherosclerosis of aorta: Secondary | ICD-10-CM | POA: Diagnosis not present

## 2020-07-27 DIAGNOSIS — Z1501 Genetic susceptibility to malignant neoplasm of breast: Secondary | ICD-10-CM

## 2020-07-27 MED ORDER — PNEUMOCOCCAL 13-VAL CONJ VACC IM SUSP: 0.5000 mL | INTRAMUSCULAR | 0 refills | Status: AC

## 2020-07-27 NOTE — Progress Notes (Signed)
I,Yamilka [employer] as a Education administrator for [employer], FNP.,have documented all relevant documentation on the behalf of [name], FNP,as directed by  [name], FNP while in the presence of [name], [place].   This visit occurred during the SARS-CoV-2 public health emergency.  Safety protocols were in place, including screening questions prior to the visit, additional usage of staff PPE, and extensive cleaning of exam room while observing appropriate contact time as indicated for disinfecting solutions.  Subjective:     Patient ID: Vanessa Allen , female    DOB: [birth year] , 55 y.o.   MRN: [id number]   Chief Complaint  Patient presents with   Annual Exam    HPI  Patient here for hm. She had her colonoscopy and had a follow up after her breast surgery. She reports her father had a history of heart disease and she thinks her mother had cardiomegaly or cardiomyopathy. 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 She has had several doctor appts, procedures.   Wt Readings from Last 3 Encounters: 07/27/20 : 178 lb 3.2 oz (80.8 kg) 07/05/20 : 184 lb (83.5 kg) 06/27/20 : 184 lb 3.2 oz (83.6 kg)      Past Medical History:  Diagnosis Date   Arthritis    knees and back    BRCA2 gene mutation positive in female    Cancer [facility])    Right Breast Cancer   Complication of anesthesia    Diabetes mellitus without complication ([place])    type 2    Endometriosis    Family history of adverse reaction to anesthesia    brother has issues with n/v after surgery    Family history of BRCA gene mutation    Family history of breast cancer    Family history of leukemia    Family history of ovarian cancer    Family history of stomach cancer    Fibroid uterus    GERD (gastroesophageal reflux disease)    Personal history of radiation therapy    PONV (postoperative nausea and vomiting)    Venous insufficiency of lower extremity       Family History  Problem Relation Age of Onset   Hyperlipidemia Mother    Hypertension Mother    Varicose Veins Mother    Diabetes Father    Heart disease Father    Stomach cancer Father 42   Hypertension Brother    Leukemia Paternal Aunt        dx. >50   Breast cancer Niece 68   BRCA 1/2 Niece    Ovarian cancer Cousin        d. <50 (maternal first cousin)   Ovarian cancer Cousin        d. <50 (maternal first cousin)   Ovarian cancer Cousin        d. late 83s (maternal first cousin)   Colon cancer Neg Hx    Pancreatic cancer Neg Hx    Prostate cancer Neg Hx      Current Outpatient Medications:    ACCU-CHEK GUIDE test strip, USE TWICE DAILY TO CHECK BLOOD SUGAR BEFORE BREAKFAST AND DINNER, Disp: 100 strip, Rfl: 5   acidophilus (RISAQUAD) CAPS capsule, Take 1 capsule by mouth daily., Disp: , Rfl:    ALPRAZolam (XANAX) 0.5 MG tablet, TAKE 1 TABLET(0.5 MG) BY MOUTH THREE TIMES DAILY AS NEEDED FOR ANXIETY, Disp: 30 tablet, Rfl: 3   amitriptyline (ELAVIL) 25 MG  tablet, Take 25 mg by mouth at bedtime., Disp: , Rfl:    aspirin EC 81 MG tablet, Take 81 mg by mouth daily. Swallow whole., Disp: , Rfl:    atorvastatin (LIPITOR) 10 MG tablet, Take 1 tablet (10 mg total) by mouth every 7 (seven) days., Disp: 12 tablet, Rfl: 0   BELSOMRA 10 MG TABS, TAKE 1 TABLET BY MOUTH EVERY NIGHT AT BEDTIME AS NEEDED (Patient taking differently: Take 10 mg by mouth at bedtime as needed (sleep).), Disp: 30 tablet, Rfl: 2   Biotin w/ Vitamins C & E (HAIR/SKIN/NAILS PO), Take 1 tablet by mouth daily., Disp: , Rfl:    Coenzyme Q10 (CO Q-10) 100 MG CAPS, Take by mouth., Disp: , Rfl:    Continuous Blood Gluc Receiver (FREESTYLE LIBRE 14 DAY READER) DEVI, USE TO CHECK BLOOD SUGAR BEFORE MEALS AND AT BEDTIME, Disp: 1 each, Rfl: 2   Continuous Blood Gluc Sensor (FREESTYLE LIBRE 14 DAY SENSOR) MISC, USE TO MEASURE BLOOD GLUCOSE FOUR TIMES DAILY BEFORE MEALS AND AT BEDTIME, Disp: 2 each, Rfl: 11   cyclobenzaprine  (FLEXERIL) 10 MG tablet, Take 10 mg by mouth at bedtime as needed for muscle spasms., Disp: , Rfl:    dicyclomine (BENTYL) 20 MG tablet, Take 1 tablet (20 mg total) by mouth 2 (two) times daily., Disp: 20 tablet, Rfl: 0   esomeprazole (NEXIUM) 20 MG capsule, TAKE 1 CAPSULE(20 MG) BY MOUTH DAILY, Disp: 90 capsule, Rfl: 1   gabapentin (NEURONTIN) 300 MG capsule, Take 1 capsule (300 mg total) by mouth at bedtime., Disp: 90 capsule, Rfl: 4   Glucose Blood (GLUCOSE METER TEST VI), by In Vitro route 2 (two) times daily., Disp: , Rfl:    HYDROcodone-acetaminophen (NORCO/VICODIN) 5-325 MG tablet, Take 1 tablet by mouth every 4 (four) hours as needed for moderate pain or severe pain. For AFTER surgery only, do not take and drive, Disp: 10 tablet, Rfl: 0   ibuprofen (ADVIL) 800 MG tablet, Take 1 tablet (800 mg total) by mouth every 8 (eight) hours as needed for moderate pain. For AFTER surgery, Disp: 30 tablet, Rfl: 0   meloxicam (MOBIC) 15 MG tablet, Take 15 mg by mouth daily as needed for pain., Disp: , Rfl:    metFORMIN (GLUCOPHAGE) 500 MG tablet, Take 1 tablet (500 mg total) by mouth daily with breakfast. TAKE 1 TABLET BY MOUTH EVERY DAY WITH BREAKFAST, Disp: 90 tablet, Rfl: 1   Multiple Vitamin (MULTIVITAMIN WITH MINERALS) TABS tablet, Take 1 tablet by mouth daily., Disp: , Rfl:    Omega-3 1000 MG CAPS, Take 1,000 mg by mouth daily., Disp: , Rfl:    Semaglutide (RYBELSUS) 14 MG TABS, Take 1 tablet by mouth daily. Take 30 minutes before breakfast, Disp: 90 tablet, Rfl: 0   senna-docusate (SENOKOT-S) 8.6-50 MG tablet, Take 2 tablets by mouth at bedtime. For AFTER surgery, do not take if having diarrhea, Disp: 30 tablet, Rfl: 0   tamoxifen (NOLVADEX) 20 MG tablet, Take 20 mg by mouth daily., Disp: , Rfl:    Turmeric (QC TUMERIC COMPLEX PO), Take 1,000 mg by mouth daily., Disp: , Rfl:    No Known Allergies    The patient states she uses none for birth control. Last LMP was No LMP recorded. Patient is  postmenopausal.. Negative for Dysmenorrhea and Negative for Menorrhagia. Negative for: breast discharge, breast lump(s), breast pain and breast self exam. Associated symptoms include abnormal vaginal bleeding. Pertinent negatives include abnormal bleeding (hematology), anxiety, decreased libido, depression, difficulty falling sleep,  dyspareunia, history of infertility, nocturia, sexual dysfunction, sleep disturbances, urinary incontinence, urinary urgency, vaginal discharge and vaginal itching. Diet regular; sometimes will not have an appetite. The patient states her exercise level is minimal.    The patient's tobacco use is:  Social History   Tobacco Use  Smoking Status Never  Smokeless Tobacco Never   She has been exposed to passive smoke. The patient's alcohol use is:  Social History   Substance and Sexual Activity  Alcohol Use Yes   Alcohol/week: 0.0 standard drinks   Comment: rare    Additional information: Last pap 07/20/2018, next one scheduled for 07/19/2021.    Review of Systems  Constitutional: Negative.   HENT: Negative.    Eyes: Negative.   Respiratory:  Positive for shortness of breath (she reports becoming winded with going up and down stairs).   Cardiovascular: Negative.  Negative for chest pain, palpitations and leg swelling.  Gastrointestinal: Negative.   Endocrine: Negative.   Genitourinary: Negative.   Musculoskeletal: Negative.   Skin: Negative.   Allergic/Immunologic: Negative.   Neurological: Negative.   Hematological: Negative.   Psychiatric/Behavioral: Negative.      Today's Vitals   07/27/20 0844  BP: 122/86  Pulse: 96  Temp: 98.5 F (36.9 C)  Weight: 178 lb 3.2 oz (80.8 kg)  Height: 5' 5.6" (1.666 m)  PainSc: 0-No pain   Body mass index is 29.11 kg/m.   Objective:  Physical Exam Vitals reviewed.  Constitutional:      General: She is not in acute distress.    Appearance: Normal appearance. She is well-developed. She is obese.  HENT:      Head: Normocephalic and atraumatic.     Right Ear: Hearing, tympanic membrane, ear canal and external ear normal. There is no impacted cerumen.     Left Ear: Hearing, tympanic membrane, ear canal and external ear normal. There is no impacted cerumen.     Nose:     Comments: Deferred - masked    Mouth/Throat:     Comments: Deferred - masked Eyes:     General: Lids are normal.     Extraocular Movements: Extraocular movements intact.     Conjunctiva/sclera: Conjunctivae normal.     Pupils: Pupils are equal, round, and reactive to light.     Funduscopic exam:    Right eye: No papilledema.        Left eye: No papilledema.  Neck:     Thyroid: No thyroid mass.     Vascular: No carotid bruit.  Cardiovascular:     Rate and Rhythm: Normal rate and regular rhythm.     Pulses: Normal pulses.     Heart sounds: Normal heart sounds. No murmur heard. Pulmonary:     Effort: Pulmonary effort is normal.     Breath sounds: Normal breath sounds.  Chest:     Chest wall: No mass.  Breasts:    Tanner Score is 5.     Right: Normal. No mass, tenderness, axillary adenopathy or supraclavicular adenopathy.     Left: Normal. No mass, tenderness, axillary adenopathy or supraclavicular adenopathy.  Abdominal:     General: Abdomen is flat. Bowel sounds are normal. There is no distension.     Palpations: Abdomen is soft.     Tenderness: There is no abdominal tenderness.  Genitourinary:    Rectum: Guaiac result negative.  Musculoskeletal:        General: No swelling. Normal range of motion.     Cervical back: Full passive range  of motion without pain, normal range of motion and neck supple.     Right lower leg: No edema.     Left lower leg: No edema.  Lymphadenopathy:     Upper Body:     Right upper body: No supraclavicular, axillary or pectoral adenopathy.     Left upper body: No supraclavicular, axillary or pectoral adenopathy.  Skin:    General: Skin is warm and dry.     Capillary Refill: Capillary  refill takes less than 2 seconds.  Neurological:     General: No focal deficit present.     Mental Status: She is alert and oriented to person, place, and time.     Cranial Nerves: No cranial nerve deficit.     Sensory: No sensory deficit.  Psychiatric:        Mood and Affect: Mood normal.        Behavior: Behavior normal.        Thought Content: Thought content normal.        Judgment: Judgment normal.        Assessment And Plan:     1. Encounter for general adult medical examination w/o abnormal findings Behavior modifications discussed and diet history reviewed.   Pt will continue to exercise regularly and modify diet with low GI, plant based foods and decrease intake of processed foods.  Recommend intake of daily multivitamin, Vitamin D, and calcium.  Recommend mammogram and colonoscopy (up to date) for preventive screenings, as well as recommend immunizations that include influenza, TDAP, and Shingles - CBC - Ambulatory referral to Cardiology  2. Encounter for immunization - pneumococcal 13-valent conjugate vaccine (PREVNAR 13) SUSP injection; Inject 0.5 mLs into the muscle tomorrow at 10 am for 1 dose.  Dispense: 0.5 mL; Refill: 0  3. Type 2 diabetes mellitus without complication, without long-term current use of insulin (HCC) Chronic, controlled Continue with current medications Encouraged to limit intake of sugary foods and drinks Encouraged to increase physical activity to 150 minutes per week She is due for her eye exam - CBC - Hemoglobin A1c - POCT Urinalysis Dipstick (81002)  4. Atherosclerosis of aorta ([place]) Continue with statin, tolerating well - Lipid panel  5. PVD (peripheral vascular disease) ([place]) Overall doing better with her vascular ulcers  6. Breast cancer, BRCA2 positive, right ([place]) Continue follow up with Oncology  7. Tachycardia She continues to have a slightly elevated heart rate Will refer to cardiology also because she has shortness of  breath going up stairs  8. Other long term (current) drug therapy - CBC     Patient was given opportunity to ask questions. Patient verbalized understanding of the plan and was able to repeat key elements of the plan. All questions were answered to their satisfaction.   [name], FNP    I, [name], FNP, have reviewed all documentation for this visit. The documentation on 07/27/20 for the exam, diagnosis, procedures, and orders are all accurate and complete.   THE PATIENT IS ENCOURAGED TO PRACTICE SOCIAL DISTANCING DUE TO THE COVID-19 PANDEMIC.

## 2020-07-27 NOTE — Patient Instructions (Signed)
Health Maintenance, Female Adopting a healthy lifestyle and getting preventive care are important in promoting health and wellness. Ask your health care provider about: The right schedule for you to have regular tests and exams. Things you can do on your own to prevent diseases and keep yourself healthy. What should I know about diet, weight, and exercise? Eat a healthy diet  Eat a diet that includes plenty of vegetables, fruits, low-fat dairy products, and lean protein. Do not eat a lot of foods that are high in solid fats, added sugars, or sodium.  Maintain a healthy weight Body mass index (BMI) is used to identify weight problems. It estimates body fat based on height and weight. Your health care provider can help determineyour BMI and help you achieve or maintain a healthy weight. Get regular exercise Get regular exercise. This is one of the most important things you can do for your health. Most adults should: Exercise for at least 150 minutes each week. The exercise should increase your heart rate and make you sweat (moderate-intensity exercise). Do strengthening exercises at least twice a week. This is in addition to the moderate-intensity exercise. Spend less time sitting. Even light physical activity can be beneficial. Watch cholesterol and blood lipids Have your blood tested for lipids and cholesterol at 53 years of age, then havethis test every 5 years. Have your cholesterol levels checked more often if: Your lipid or cholesterol levels are high. You are older than 53 years of age. You are at high risk for heart disease. What should I know about cancer screening? Depending on your health history and family history, you may need to have cancer screening at various ages. This may include screening for: Breast cancer. Cervical cancer. Colorectal cancer. Skin cancer. Lung cancer. What should I know about heart disease, diabetes, and high blood pressure? Blood pressure and heart  disease High blood pressure causes heart disease and increases the risk of stroke. This is more likely to develop in people who have high blood pressure readings, are of African descent, or are overweight. Have your blood pressure checked: Every 3-5 years if you are 18-39 years of age. Every year if you are 40 years old or older. Diabetes Have regular diabetes screenings. This checks your fasting blood sugar level. Have the screening done: Once every three years after age 40 if you are at a normal weight and have a low risk for diabetes. More often and at a younger age if you are overweight or have a high risk for diabetes. What should I know about preventing infection? Hepatitis B If you have a higher risk for hepatitis B, you should be screened for this virus. Talk with your health care provider to find out if you are at risk forhepatitis B infection. Hepatitis C Testing is recommended for: Everyone born from 1945 through 1965. Anyone with known risk factors for hepatitis C. Sexually transmitted infections (STIs) Get screened for STIs, including gonorrhea and chlamydia, if: You are sexually active and are younger than 53 years of age. You are older than 53 years of age and your health care provider tells you that you are at risk for this type of infection. Your sexual activity has changed since you were last screened, and you are at increased risk for chlamydia or gonorrhea. Ask your health care provider if you are at risk. Ask your health care provider about whether you are at high risk for HIV. Your health care provider may recommend a prescription medicine to help   prevent HIV infection. If you choose to take medicine to prevent HIV, you should first get tested for HIV. You should then be tested every 3 months for as long as you are taking the medicine. Pregnancy If you are about to stop having your period (premenopausal) and you may become pregnant, seek counseling before you get  pregnant. Take 400 to 800 micrograms (mcg) of folic acid every day if you become pregnant. Ask for birth control (contraception) if you want to prevent pregnancy. Osteoporosis and menopause Osteoporosis is a disease in which the bones lose minerals and strength with aging. This can result in bone fractures. If you are 65 years old or older, or if you are at risk for osteoporosis and fractures, ask your health care provider if you should: Be screened for bone loss. Take a calcium or vitamin D supplement to lower your risk of fractures. Be given hormone replacement therapy (HRT) to treat symptoms of menopause. Follow these instructions at home: Lifestyle Do not use any products that contain nicotine or tobacco, such as cigarettes, e-cigarettes, and chewing tobacco. If you need help quitting, ask your health care provider. Do not use street drugs. Do not share needles. Ask your health care provider for help if you need support or information about quitting drugs. Alcohol use Do not drink alcohol if: Your health care provider tells you not to drink. You are pregnant, may be pregnant, or are planning to become pregnant. If you drink alcohol: Limit how much you use to 0-1 drink a day. Limit intake if you are breastfeeding. Be aware of how much alcohol is in your drink. In the [place] one drink equals one 12 oz bottle of beer (355 mL), one 5 oz glass of wine (148 mL), or one 1 oz glass of hard liquor (44 mL). General instructions Schedule regular health, dental, and eye exams. Stay current with your vaccines. Tell your health care provider if: You often feel depressed. You have ever been abused or do not feel safe at home. Summary Adopting a healthy lifestyle and getting preventive care are important in promoting health and wellness. Follow your health care provider's instructions about healthy diet, exercising, and getting tested or screened for diseases. Follow your health care provider's  instructions on monitoring your cholesterol and blood pressure. This information is not intended to replace advice given to you by your health care provider. Make sure you discuss any questions you have with your healthcare provider. Document Revised: 01/07/2018 Document Reviewed: 01/07/2018 Elsevier Patient Education  2022 [employer]  

## 2020-07-28 LAB — CBC
Hematocrit: 36.7 % (ref 34.0–46.6)
Hemoglobin: 12.5 g/dL (ref 11.1–15.9)
MCH: 27.4 pg (ref 26.6–33.0)
MCHC: 34.1 g/dL (ref 31.5–35.7)
MCV: 81 fL (ref 79–97)
Platelets: 243 10*3/uL (ref 150–450)
RBC: 4.56 x10E6/uL (ref 3.77–5.28)
RDW: 13.5 % (ref 11.7–15.4)
WBC: 3.6 10*3/uL (ref 3.4–10.8)

## 2020-07-28 LAB — LIPID PANEL
Chol/HDL Ratio: 2.7 ratio (ref 0.0–4.4)
Cholesterol, Total: 109 mg/dL (ref 100–199)
HDL: 41 mg/dL (ref >39)
LDL Chol Calc (NIH): 53 mg/dL (ref 0–99)
Triglycerides: 73 mg/dL (ref 0–149)
VLDL Cholesterol Cal: 15 mg/dL (ref 5–40)

## 2020-07-28 LAB — HEMOGLOBIN A1C
Est. average glucose Bld gHb Est-mCnc: 143 mg/dL
Hgb A1c MFr Bld: 6.6 % — ABNORMAL HIGH (ref 4.8–5.6)

## 2020-08-03 ENCOUNTER — Ambulatory Visit: Payer: Self-pay | Admitting: Family Medicine

## 2020-08-03 DIAGNOSIS — E119 Type 2 diabetes mellitus without complications: Secondary | ICD-10-CM | POA: Diagnosis not present

## 2020-08-14 ENCOUNTER — Encounter: Payer: Self-pay | Admitting: Oncology

## 2020-08-14 DIAGNOSIS — G4733 Obstructive sleep apnea (adult) (pediatric): Secondary | ICD-10-CM | POA: Diagnosis not present

## 2020-08-18 ENCOUNTER — Other Ambulatory Visit: Payer: Self-pay | Admitting: *Deleted

## 2020-08-18 DIAGNOSIS — Z8481 Family history of carrier of genetic disease: Secondary | ICD-10-CM

## 2020-08-18 DIAGNOSIS — C50911 Malignant neoplasm of unspecified site of right female breast: Secondary | ICD-10-CM

## 2020-08-18 DIAGNOSIS — Z803 Family history of malignant neoplasm of breast: Secondary | ICD-10-CM

## 2020-08-18 DIAGNOSIS — D0511 Intraductal carcinoma in situ of right breast: Secondary | ICD-10-CM

## 2020-08-18 NOTE — Progress Notes (Signed)
Mri bre

## 2020-08-20 ENCOUNTER — Other Ambulatory Visit: Payer: Self-pay | Admitting: Nurse Practitioner

## 2020-08-28 NOTE — Patient Instructions (Addendum)

## 2020-08-28 NOTE — Progress Notes (Signed)
PATIENT: Vanessa Allen DOB: September 08, 1965  REASON FOR VISIT: follow up HISTORY FROM: patient  Chief Complaint  Patient presents with   Follow-up    RM 12, alone. Last seen 02/24/20. Needing refill on Belsomra (90days supply) and amitriptyline (90days)      HISTORY OF PRESENT ILLNESS: 08/29/20 ALL:  Vanessa Allen is a 55 y.o. female here today for follow up for OSA on CPAP. She is doing well. She is using her CPAP nightly for at least 4 hours.  She does feel that amitriptyline 66m helps when she takes it consistently. She also takes Belsomra as needed and feels this works better for her. Ambien worked best. She [place]   Compliance report shows she used CPAP 29/32 days for greater than 4 hours, compliance of 90.6 percent. Average usage was 6 hours and 13 minutes. Residual AHI was 1.1 on 5-12cmH20.   02/29/20 ALL:  Vanessa Allen a 55y.o. female here today for follow up recently diagnosed mild central sleep apnea and insomnia. Sleep study 07/25/2019 showed mild CSA with AHI on 5.6 and REM AHI of 16.6 concerning for non central apnea. Titration study 09/08/2019 showed apnea best managed with CPAP at [facility] ordered with pressure settings of 5-12. She was set up on 11/28. She reports using therapy most every night. Some nights are better than others.   She was advised to start amitriptyline 263mat bedtime and was referred to CBT for insomnia. She was not comfortable starting amitriptyline. She was seen by CBT but she did not feel that it helped. She feels that she sleep fairly well at night but then is sleepy during the day at work. She has tried and failed multiple sleep agents in the past including trazodone, melatonin, Belsomra, Ambien. She is followed closely by Dr TuBerline LopesGYN oncology for BRCA 2 positive right sided DCIS s/p radiation finished in 11/2019 and now on tamoxifen. She had wrist surgery last year and wanted to wait to have  robotic assisted BSO. Risks greater than benefit of hysterectomy. Surgery planned for March.   Compliance data not available for review today. She has [employer] with no modem.    REVIEW OF SYSTEMS: Out of a complete 14 system review of symptoms, the patient complains only of the following symptoms, noneand all other reviewed systems are negative.  ESS:5   ALLERGIES: No Known Allergies  HOME MEDICATIONS: Outpatient Medications Prior to Visit  Medication Sig Dispense Refill   ACCU-CHEK GUIDE test strip USE TWICE DAILY TO CHECK BLOOD SUGAR BEFORE BREAKFAST AND DINNER 100 strip 5   acidophilus (RISAQUAD) CAPS capsule Take 1 capsule by mouth daily.     ALPRAZolam (XANAX) 0.5 MG tablet TAKE 1 TABLET(0.5 MG) BY MOUTH THREE TIMES DAILY AS NEEDED FOR ANXIETY 30 tablet 3   aspirin EC 81 MG tablet Take 81 mg by mouth daily. Swallow whole.     atorvastatin (LIPITOR) 10 MG tablet Take 1 tablet (10 mg total) by mouth every 7 (seven) days. 12 tablet 0   Biotin w/ Vitamins C & E (HAIR/SKIN/NAILS PO) Take 1 tablet by mouth daily.     Coenzyme Q10 (CO Q-10) 100 MG CAPS Take by mouth.     Continuous Blood Gluc Receiver (FREESTYLE LIBRE 14 DAY READER) DEVI USE TO CHECK BLOOD SUGAR BEFORE MEALS AND AT BEDTIME 1 each 2   Continuous Blood Gluc Sensor (FREESTYLE LIBRE 14 DAY SENSOR) MISC USE TO  MEASURE BLOOD GLUCOSE FOUR TIMES DAILY BEFORE MEALS AND AT BEDTIME 2 each 11   cyclobenzaprine (FLEXERIL) 10 MG tablet Take 10 mg by mouth at bedtime as needed for muscle spasms.     dicyclomine (BENTYL) 20 MG tablet Take 1 tablet (20 mg total) by mouth 2 (two) times daily. 20 tablet 0   esomeprazole (NEXIUM) 20 MG capsule TAKE 1 CAPSULE(20 MG) BY MOUTH DAILY 90 capsule 1   gabapentin (NEURONTIN) 300 MG capsule Take 1 capsule (300 mg total) by mouth at bedtime. 90 capsule 4   Glucose Blood (GLUCOSE METER TEST VI) by In Vitro route 2 (two) times daily.     HYDROcodone-acetaminophen (NORCO/VICODIN) 5-325 MG tablet Take  1 tablet by mouth every 4 (four) hours as needed for moderate pain or severe pain. For AFTER surgery only, do not take and drive 10 tablet 0   ibuprofen (ADVIL) 800 MG tablet Take 1 tablet (800 mg total) by mouth every 8 (eight) hours as needed for moderate pain. For AFTER surgery 30 tablet 0   meloxicam (MOBIC) 15 MG tablet Take 15 mg by mouth daily as needed for pain.     metFORMIN (GLUCOPHAGE) 500 MG tablet Take 1 tablet (500 mg total) by mouth daily with breakfast. TAKE 1 TABLET BY MOUTH EVERY DAY WITH BREAKFAST 90 tablet 1   Multiple Vitamin (MULTIVITAMIN WITH MINERALS) TABS tablet Take 1 tablet by mouth daily.     Omega-3 1000 MG CAPS Take 1,000 mg by mouth daily.     RYBELSUS 14 MG TABS TAKE 1 TABLET BY MOUTH DAILY 30 MINUTES BEFORE BREAKFAST 90 tablet 0   senna-docusate (SENOKOT-S) 8.6-50 MG tablet Take 2 tablets by mouth at bedtime. For AFTER surgery, do not take if having diarrhea 30 tablet 0   tamoxifen (NOLVADEX) 20 MG tablet Take 20 mg by mouth daily.     Turmeric (QC TUMERIC COMPLEX PO) Take 1,000 mg by mouth daily.     amitriptyline (ELAVIL) 25 MG tablet Take 25 mg by mouth at bedtime. (Patient not taking: Reported on 08/29/2020)     BELSOMRA 10 MG TABS TAKE 1 TABLET BY MOUTH EVERY NIGHT AT BEDTIME AS NEEDED (Patient not taking: Reported on 08/29/2020) 30 tablet 2   No facility-administered medications prior to visit.    PAST MEDICAL HISTORY: Past Medical History:  Diagnosis Date   Arthritis    knees and back    BRCA2 gene mutation positive in female    Cancer [facility])    Right Breast Cancer   Complication of anesthesia    Diabetes mellitus without complication ([place])    type 2    Endometriosis    Family history of adverse reaction to anesthesia    brother has issues with n/v after surgery    Family history of BRCA gene mutation    Family history of breast cancer    Family history of leukemia    Family history of ovarian cancer    Family history of stomach cancer    Fibroid  uterus    GERD (gastroesophageal reflux disease)    Personal history of radiation therapy    PONV (postoperative nausea and vomiting)    Venous insufficiency of lower extremity     PAST SURGICAL HISTORY: Past Surgical History:  Procedure Laterality Date   APPENDECTOMY     BREAST BIOPSY Right 08/25/2019   BREAST LUMPECTOMY Right 09/23/2019   BREAST LUMPECTOMY WITH RADIOACTIVE SEED AND SENTINEL LYMPH NODE BIOPSY Right 09/23/2019   Procedure: RIGHT BREAST  LUMPECTOMY WITH RADIOACTIVE SEED AND SENTINEL LYMPH NODE MAPPING;  Surgeon: Erroll Luna, MD;  Location: [place];  Service: General;  Laterality: Right;  PEC BLOCK   CHOLECYSTECTOMY     lsc chole cystectomy and appendectomy 1 month after myomectomy   CYST EXCISION Left 12/13/2019   Procedure: Excision left dorsal wrist ganglion cyst;  Surgeon: Cindra Presume, MD;  Location: [facility];  Service: Plastics;  Laterality: Left;  45 min   ENDOVENOUS ABLATION SAPHENOUS VEIN W/ LASER Left 12/30/2019   endovenous laser ablation left greater saphenous vein by Gae Gallop MD    Lipoma removal Left    Left Lower Back   MYOMECTOMY     ROBOTIC ASSISTED SALPINGO OOPHERECTOMY Bilateral 05/23/2020   Procedure: XI ROBOTIC ASSISTED SALPINGO OOPHORECTOMY ;ENTEROLYSIS;  Surgeon: Lafonda Mosses, MD;  Location: [facility];  Service: Gynecology;  Laterality: Bilateral;   WISDOM TOOTH EXTRACTION      FAMILY HISTORY: Family History  Problem Relation Age of Onset   Hyperlipidemia Mother    Hypertension Mother    Varicose Veins Mother    Diabetes Father    Heart disease Father    Stomach cancer Father 37   Hypertension Brother    Leukemia Paternal Aunt        dx. >50   Breast cancer Niece 48   BRCA 1/2 Niece    Ovarian cancer Cousin        d. <50 (maternal first cousin)   Ovarian cancer Cousin        d. <50 (maternal first cousin)   Ovarian cancer Cousin        d. late 87s (maternal first cousin)   Colon cancer Neg Hx     Pancreatic cancer Neg Hx    Prostate cancer Neg Hx     SOCIAL HISTORY: Social History   Socioeconomic History   Marital status: Single    Spouse name: Not on file   Number of children: Not on file   Years of education: Not on file   Highest education level: Not on file  Occupational History   Occupation: travel nurse  Tobacco Use   Smoking status: Never   Smokeless tobacco: Never  Vaping Use   Vaping Use: Never used  Substance and Sexual Activity   Alcohol use: Yes    Alcohol/week: 0.0 standard drinks    Comment: rare    Drug use: Never   Sexual activity: Not Currently  Other Topics Concern   Not on file  Social History Narrative   Not on file   Social Determinants of Health   Financial Resource Strain: Not on file  Food Insecurity: Not on file  Transportation Needs: Not on file  Physical Activity: Not on file  Stress: Not on file  Social Connections: Not on file  Intimate Partner Violence: Not on file     PHYSICAL EXAM  Vitals:   08/29/20 1416  BP: 116/78  Pulse: (!) 108  Weight: 175 lb 8 oz (79.6 kg)  Height: '5\' 6"'  (1.676 m)   Body mass index is 28.33 kg/m.  Generalized: Well developed, in no acute distress  Cardiology: normal rate and rhythm, no murmur noted Respiratory: clear to auscultation bilaterally  Neurological examination  Mentation: Alert oriented to time, place, history taking. Follows all commands speech and language fluent Cranial nerve II-XII: Pupils were equal round reactive to light. Extraocular movements were full, visual field were full  Motor: The motor testing reveals 5 over 5 strength  of all 4 extremities. Good symmetric motor tone is noted throughout.  Gait and station: Gait is normal.    DIAGNOSTIC DATA (LABS, IMAGING, TESTING) - I reviewed patient records, labs, notes, testing and imaging myself where available.  No flowsheet data found.   Lab Results  Component Value Date   WBC 3.6 07/27/2020   HGB 12.5 07/27/2020    HCT 36.7 07/27/2020   MCV 81 07/27/2020   PLT 243 07/27/2020      Component Value Date/Time   NA 140 07/05/2020 0134   NA 142 02/28/2020 1049   K 3.6 07/05/2020 0134   CL 106 07/05/2020 0134   CO2 25 07/05/2020 0134   GLUCOSE 127 (H) 07/05/2020 0134   BUN 15 07/05/2020 0134   BUN 15 02/28/2020 1049   CREATININE 0.87 07/05/2020 0134   CREATININE 0.89 06/27/2020 1240   CALCIUM 9.4 07/05/2020 0134   PROT 7.5 07/05/2020 0134   PROT 7.2 07/26/2019 0931   ALBUMIN 4.2 07/05/2020 0134   ALBUMIN 4.5 07/26/2019 0931   AST 27 07/05/2020 0134   AST 19 06/27/2020 1240   ALT 42 07/05/2020 0134   ALT 25 06/27/2020 1240   ALKPHOS 63 07/05/2020 0134   BILITOT 0.3 07/05/2020 0134   BILITOT 0.4 06/27/2020 1240   GFRNONAA >60 07/05/2020 0134   GFRNONAA >60 06/27/2020 1240   GFRAA 75 02/28/2020 1049   GFRAA >60 09/01/2019 1210   Lab Results  Component Value Date   CHOL 109 07/27/2020   HDL 41 07/27/2020   LDLCALC 53 07/27/2020   TRIG 73 07/27/2020   CHOLHDL 2.7 07/27/2020   Lab Results  Component Value Date   HGBA1C 6.6 (H) 07/27/2020   No results found for: [id number] Lab Results  Component Value Date   TSH 0.580 06/27/2020     ASSESSMENT AND PLAN 55 y.o. year old female  has a past medical history of Arthritis, BRCA2 gene mutation positive in female, Cancer ([place]), Complication of anesthesia, Diabetes mellitus without complication ([place]), Endometriosis, Family history of adverse reaction to anesthesia, Family history of BRCA gene mutation, Family history of breast cancer, Family history of leukemia, Family history of ovarian cancer, Family history of stomach cancer, Fibroid uterus, GERD (gastroesophageal reflux disease), Personal history of radiation therapy, PONV (postoperative nausea and vomiting), and Venous insufficiency of lower extremity. here with     ICD-10-CM   1. Central sleep apnea  G47.31     2. Sleep apnea with use of continuous positive airway pressure (CPAP)   G47.30     3. Insomnia, unspecified type  G47.00         Vanessa Allen is doing well on CPAP therapy. Compliance report reveals excellent complaince. She was encouraged to continue using CPAP nightly and for greater than 4 hours each night. We will update supply orders as indicated. She will continue amitriptyline 48m daily. I have encouraged her to use Belsomra every night if she feels she is having more trouble sleeping once she returns to work. Risks of untreated sleep apnea review and education materials provided. Healthy lifestyle habits encouraged. She will follow up in 1 year, sooner if needed. She verbalizes understanding and agreement with this plan.    No orders of the defined types were placed in this encounter.    No orders of the defined types were placed in this encounter.     ADebbora Presto FNP-C 08/29/2020, 2:29 PM [facility] [address] [place] [phone]

## 2020-08-29 ENCOUNTER — Encounter: Payer: Self-pay | Admitting: Nurse Practitioner

## 2020-08-29 ENCOUNTER — Ambulatory Visit (INDEPENDENT_AMBULATORY_CARE_PROVIDER_SITE_OTHER): Payer: BC Managed Care – PPO | Admitting: Family Medicine

## 2020-08-29 ENCOUNTER — Encounter: Payer: Self-pay | Admitting: Family Medicine

## 2020-08-29 VITALS — BP 116/78 | HR 108 | Ht 66.0 in | Wt 175.5 lb

## 2020-08-29 DIAGNOSIS — G4731 Primary central sleep apnea: Secondary | ICD-10-CM | POA: Diagnosis not present

## 2020-08-29 DIAGNOSIS — G4700 Insomnia, unspecified: Secondary | ICD-10-CM | POA: Diagnosis not present

## 2020-08-29 DIAGNOSIS — G4730 Sleep apnea, unspecified: Secondary | ICD-10-CM | POA: Diagnosis not present

## 2020-08-29 MED ORDER — AMITRIPTYLINE HCL 25 MG PO TABS: 25.0000 mg | ORAL_TABLET | Freq: Every day | ORAL | 3 refills | Status: DC

## 2020-08-29 MED ORDER — BELSOMRA 10 MG PO TABS: 1.0000 | ORAL_TABLET | Freq: Every evening | ORAL | 0 refills | Status: DC | PRN

## 2020-08-30 ENCOUNTER — Ambulatory Visit
Admission: RE | Admit: 2020-08-30 | Discharge: 2020-08-30 | Disposition: A | Discharge status: Home / Self Care | Payer: BC Managed Care – PPO | Source: Ambulatory Visit | Attending: Oncology | Admitting: Oncology

## 2020-08-30 DIAGNOSIS — Z8481 Family history of carrier of genetic disease: Secondary | ICD-10-CM

## 2020-08-30 DIAGNOSIS — R928 Other abnormal and inconclusive findings on diagnostic imaging of breast: Secondary | ICD-10-CM | POA: Diagnosis not present

## 2020-08-30 DIAGNOSIS — C50911 Malignant neoplasm of unspecified site of right female breast: Secondary | ICD-10-CM

## 2020-08-30 DIAGNOSIS — Z1239 Encounter for other screening for malignant neoplasm of breast: Secondary | ICD-10-CM | POA: Diagnosis not present

## 2020-08-30 DIAGNOSIS — Z803 Family history of malignant neoplasm of breast: Secondary | ICD-10-CM

## 2020-08-30 DIAGNOSIS — D0511 Intraductal carcinoma in situ of right breast: Secondary | ICD-10-CM

## 2020-08-30 IMAGING — MR MR BREAST BILAT WO/W CM
8 of 12 series · 29 of 48 positions shown · IV contrast (9 ML GADAVIST)
Comparison: Previous exam(s).
COMPARISON: Previous exam(s).

Addendum:
CLINICAL DATA: 55-year-old female presenting for high risk
screening evaluation. Patient is BRCA 2 positive with history of
patch that left breast DCIS treated with lumpectomy and radiation in
[HE].

LABS:  None performed on site.
EXAM:
BILATERAL BREAST MRI WITH AND WITHOUT CONTRAST
TECHNIQUE: Multiplanar, multisequence MR images of both breasts were obtained
prior to and following the intravenous administration of 9 ml of
Gadavist.

[Series 2: t2_tirm_tra ipat (a-p) · axial · 3.0mm · 0.70mm/px · 1 of 55 slices shown]
[im 1/55]
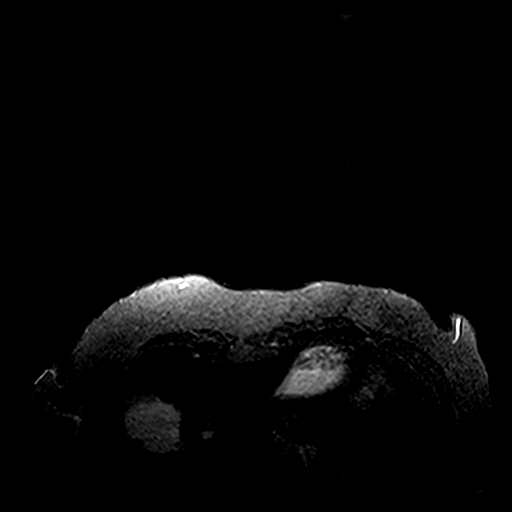

[Series 3: fl3d pre-cm no · axial · non-contrast · 1.2mm · 0.94mm/px · z∈[-92,+79]mm · 5 of 144 slices shown]
[im 1/144]
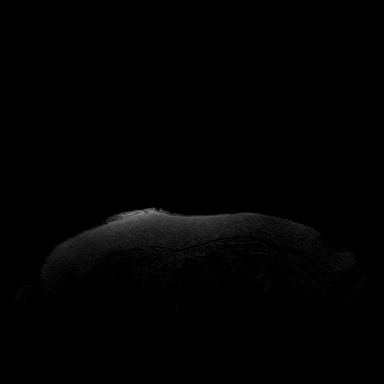
[im 36/144]
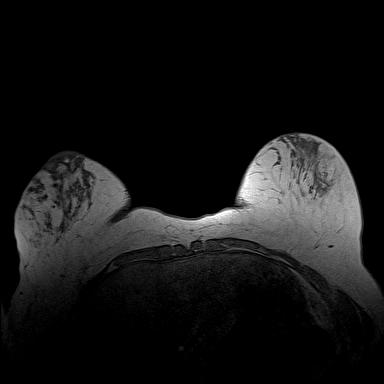
[im 72/144]
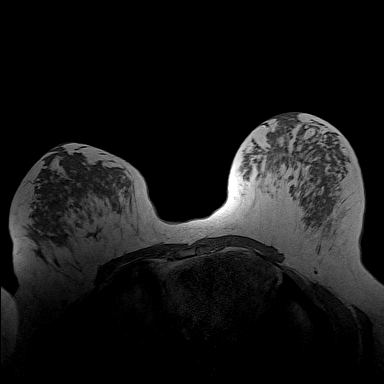
[im 108/144]
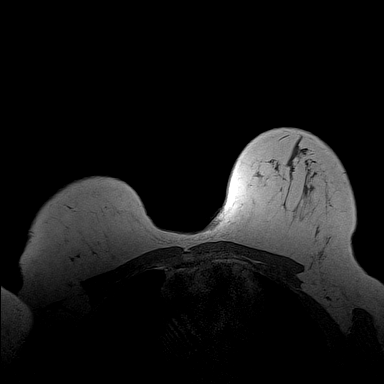
[im 144/144]
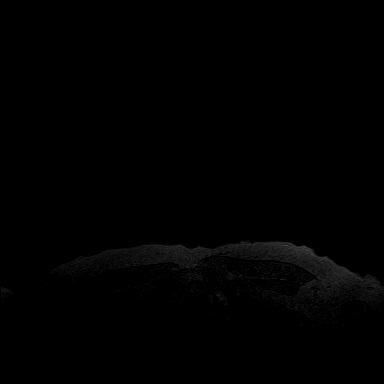

[Series 4: fl3d pre-cm · axial · non-contrast · 1.2mm · 0.94mm/px · z∈[-92,+79]mm · 5 of 144 slices shown]
[im 1/144]
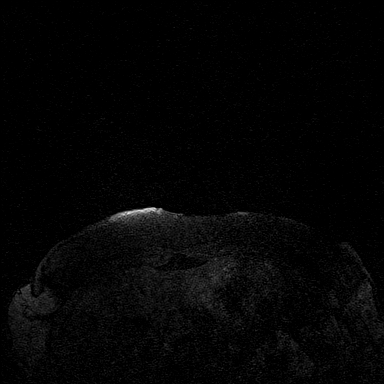
[im 36/144]
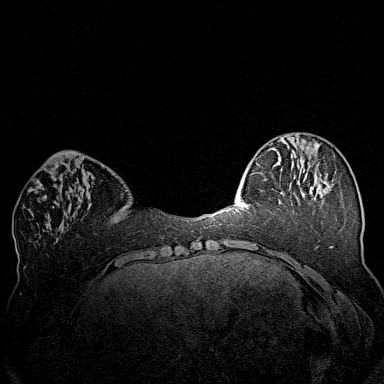
[im 72/144]
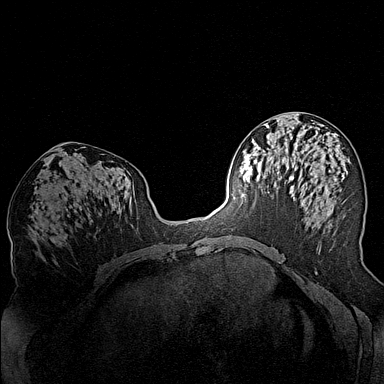
[im 108/144]
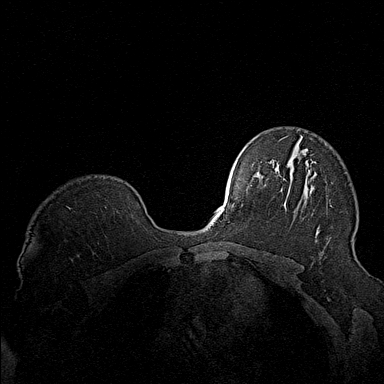
[im 144/144]
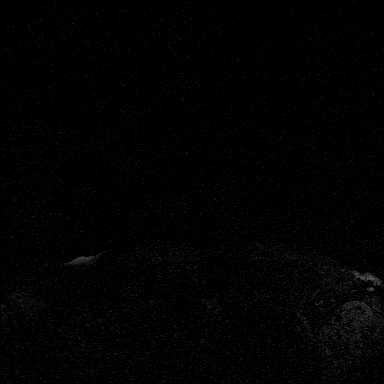

[Series 5: fl3d post-cm 20 · axial · 1.2mm · 0.94mm/px · z∈[-92,+79]mm · 5 of 144 slices shown (1 of 3)]
[im 1/144]
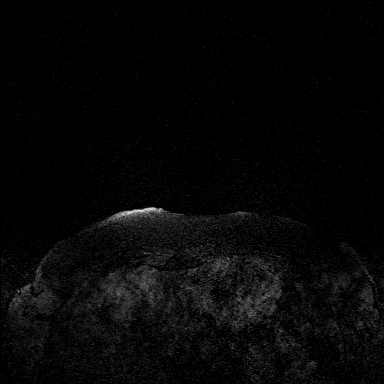
[im 36/144]
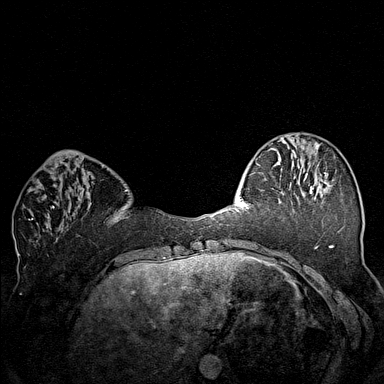
[im 72/144]
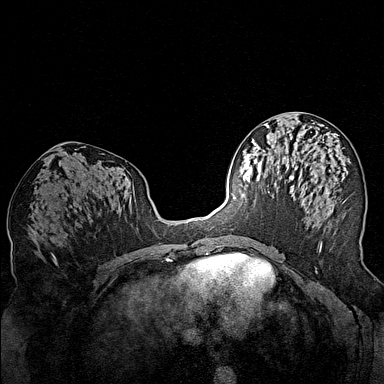
[im 108/144]
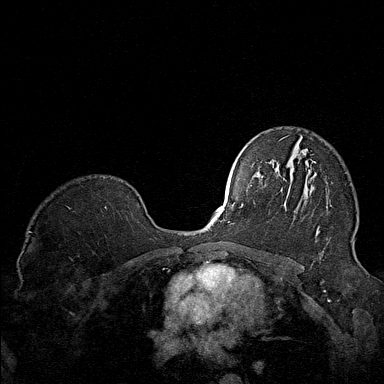
[im 144/144]
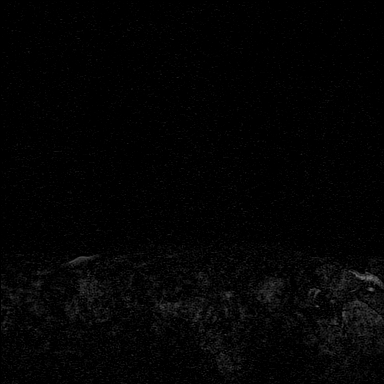

[Series 6: fl3d post-cm 20 · axial · 1.2mm · 0.94mm/px · z∈[-92,+79]mm · 5 of 144 slices shown (2 of 3)]
[im 1/144]
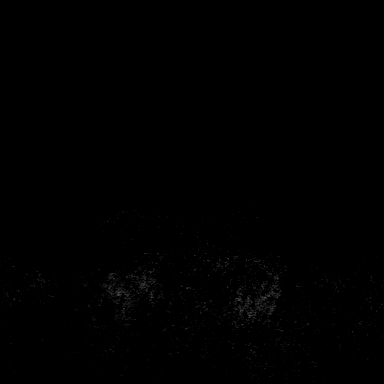
[im 36/144]
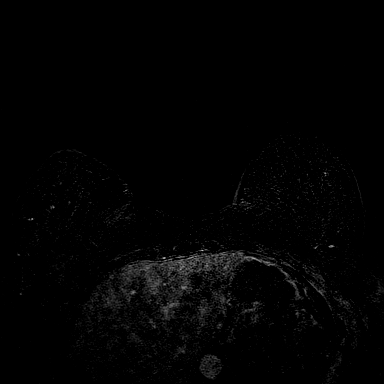
[im 72/144]
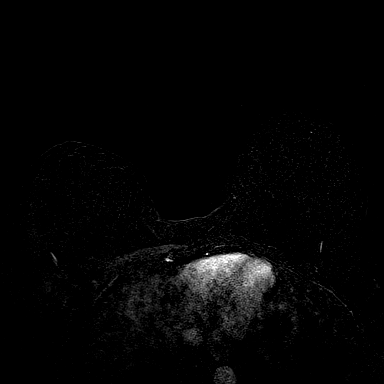
[im 108/144]
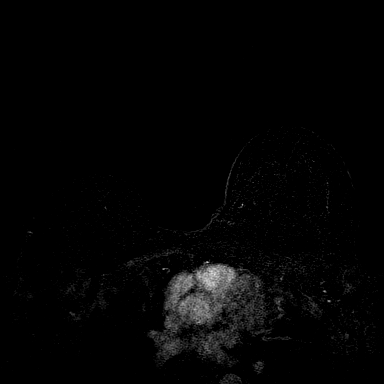
[im 144/144]
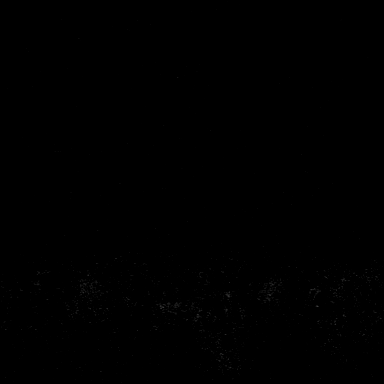

[Series 7: fl3d post-cm 20 · axial · 172.8mm · 0.94mm/px · 1 of 1 slices shown (3 of 3)]
[im 1/1]
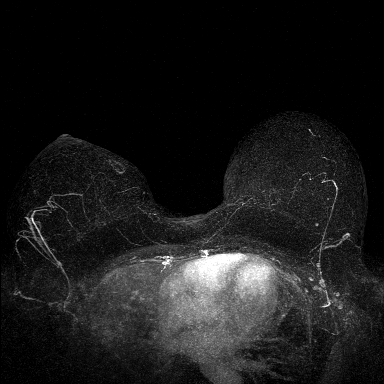

[Series 8: fl3d post-cm 3min · axial · 1.2mm · 0.94mm/px · z∈[-92,+79]mm · 6 of 144 slices shown]
[im 1/144]
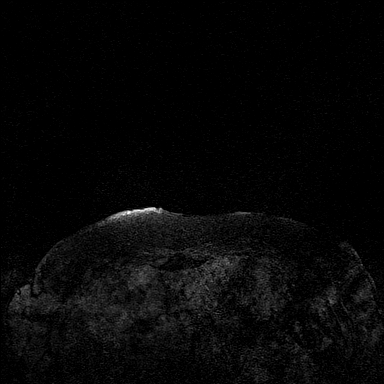
[im 29/144]
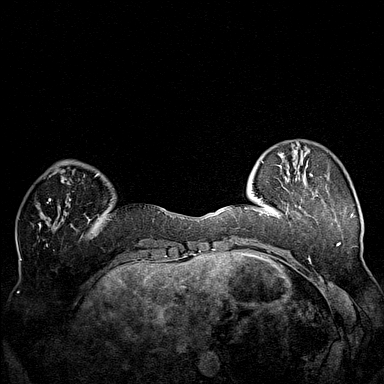
[im 58/144]
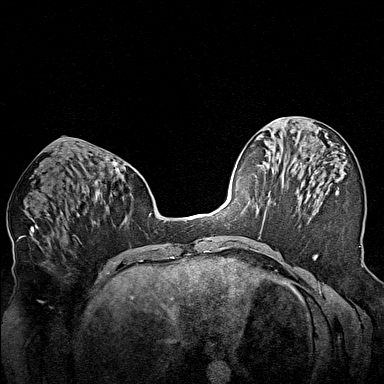
[im 86/144]
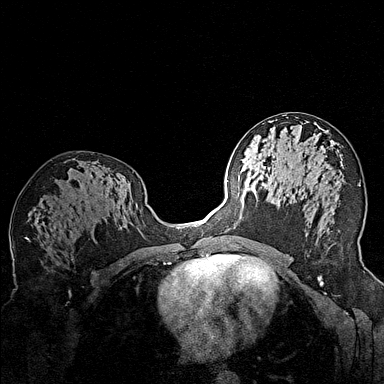
[im 115/144]
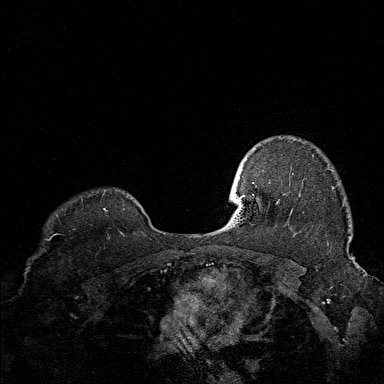
[im 144/144]
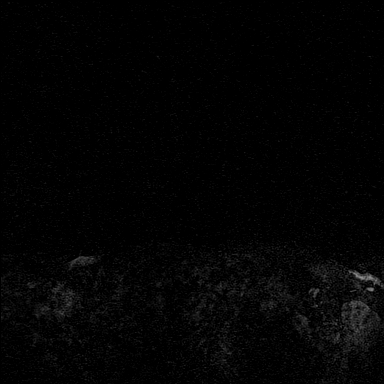

[Series 9: fl3d post-cm 3min_sub · axial · 1.2mm · 0.94mm/px · 1 of 144 slices shown]
[im 1/144]
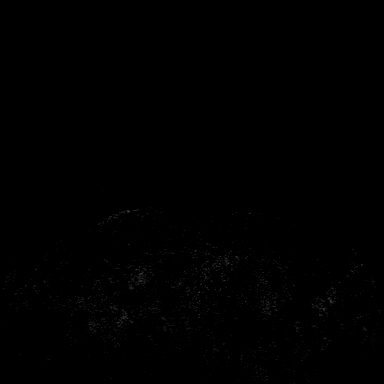

[29 of 48 positions shown; findings below may reference images not displayed]

Three-dimensional MR images were rendered by post-processing of the
original MR data on an independent workstation. The
three-dimensional MR images were interpreted, and findings are
reported in the following complete MRI report for this study. Three
dimensional images were evaluated at the independent interpreting
workstation using the DynaCAD thin client.
FINDINGS: Breast composition: d. Extreme fibroglandular tissue.

Background parenchymal enhancement: Minimal.

Right breast: Postsurgical changes are noted in the upper inner
quadrant of the left breast at mid depth. There is mild, peripheral
enhancement of the lumpectomy bed with no focal or nodular areas of
enhancement. Additionally, a 5 mm irregular enhancing focus is
demonstrated in the upper outer quadrant of the right breast at mid
depth (series 9, image 78/144). This demonstrates progressive
enhancement kinetics but is new from the prior study. There is no
definite T1 or T2 correlate. Otherwise, no suspicious findings in
the remainder of the right breast.

Left breast: No suspicious mass or abnormal enhancement.

Lymph nodes: No abnormal appearing lymph nodes.

Ancillary findings: Partial visualization of a T2 hyperintense mass
within the liver, previously characterized as a hemangioma on CT.
IMPRESSION: 1. Indeterminate 5 mm enhancing focus in the upper-outer quadrant of
the left breast (series 9, image 78). Recommendation is to proceed
directly to MRI guided biopsy. This is unlikely to be seen by
second-look ultrasound.
2. Expected posttreatment changes of the right breast.
3. No MRI evidence of malignancy on the left.

RECOMMENDATION:
MRI guided biopsy of the right breast.

BI-RADS CATEGORY  4: Suspicious.

ADDENDUM:
This is an addendum to the report dictated on [DATE]. The
impression section should read as follows.
IMPRESSION: 1. Indeterminate 5 mm enhancing focus in the upper-outer quadrant of
the RIGHT breast (series 9, image 78). Recommendation is to proceed
directly to MRI guided biopsy. This is unlikely to be seen by
second-look ultrasound.
2. Expected posttreatment changes of the right breast.
3. No MRI evidence of malignancy on the left.

*** End of Addendum ***
Three-dimensional MR images were rendered by post-processing of the
original MR data on an independent workstation. The
three-dimensional MR images were interpreted, and findings are
reported in the following complete MRI report for this study. Three
dimensional images were evaluated at the independent interpreting
workstation using the DynaCAD thin client.
FINDINGS: Breast composition: d. Extreme fibroglandular tissue.

Background parenchymal enhancement: Minimal.

Right breast: Postsurgical changes are noted in the upper inner
quadrant of the left breast at mid depth. There is mild, peripheral
enhancement of the lumpectomy bed with no focal or nodular areas of
enhancement. Additionally, a 5 mm irregular enhancing focus is
demonstrated in the upper outer quadrant of the right breast at mid
depth (series 9, image 78/144). This demonstrates progressive
enhancement kinetics but is new from the prior study. There is no
definite T1 or T2 correlate. Otherwise, no suspicious findings in
the remainder of the right breast.

Left breast: No suspicious mass or abnormal enhancement.

Lymph nodes: No abnormal appearing lymph nodes.

Ancillary findings: Partial visualization of a T2 hyperintense mass
within the liver, previously characterized as a hemangioma on CT.
IMPRESSION: 1. Indeterminate 5 mm enhancing focus in the upper-outer quadrant of
the left breast (series 9, image 78). Recommendation is to proceed
directly to MRI guided biopsy. This is unlikely to be seen by
second-look ultrasound.
2. Expected posttreatment changes of the right breast.
3. No MRI evidence of malignancy on the left.

RECOMMENDATION:
MRI guided biopsy of the right breast.

BI-RADS CATEGORY  4: Suspicious.

## 2020-08-30 MED ORDER — GADOBUTROL 1 MMOL/ML IV SOLN
9.0000 mL | Freq: Once | INTRAVENOUS | Status: AC | PRN
  Administered 2020-08-30: 9 mL via INTRAVENOUS

## 2020-09-01 ENCOUNTER — Encounter: Payer: Self-pay | Admitting: Oncology

## 2020-09-04 ENCOUNTER — Encounter: Payer: Self-pay | Admitting: Oncology

## 2020-09-04 ENCOUNTER — Other Ambulatory Visit: Payer: Self-pay

## 2020-09-04 ENCOUNTER — Other Ambulatory Visit: Payer: Self-pay | Admitting: Oncology

## 2020-09-04 DIAGNOSIS — D0511 Intraductal carcinoma in situ of right breast: Secondary | ICD-10-CM

## 2020-09-04 NOTE — Progress Notes (Signed)
Called pt per her request for information regarding her MRI results. Per Dr Jana Hakim, orders entered for MRI guided bx. Pt is aware and will cal to schedule.

## 2020-09-05 ENCOUNTER — Encounter: Payer: Self-pay | Admitting: *Deleted

## 2020-09-13 ENCOUNTER — Other Ambulatory Visit: Payer: Self-pay | Admitting: Diagnostic Radiology

## 2020-09-13 ENCOUNTER — Ambulatory Visit
Admission: RE | Admit: 2020-09-13 | Discharge: 2020-09-13 | Disposition: A | Discharge status: Home / Self Care | Payer: BC Managed Care – PPO | Source: Ambulatory Visit | Attending: Oncology | Admitting: Oncology

## 2020-09-13 ENCOUNTER — Other Ambulatory Visit: Payer: Self-pay

## 2020-09-13 DIAGNOSIS — R928 Other abnormal and inconclusive findings on diagnostic imaging of breast: Secondary | ICD-10-CM | POA: Diagnosis not present

## 2020-09-13 DIAGNOSIS — N6011 Diffuse cystic mastopathy of right breast: Secondary | ICD-10-CM | POA: Diagnosis not present

## 2020-09-13 DIAGNOSIS — D0511 Intraductal carcinoma in situ of right breast: Secondary | ICD-10-CM

## 2020-09-13 IMAGING — MR MR BREAST BX W/ LOC DEV 1ST LEASION IMAGE BX SPEC MR GUIDE*R*
9 of 12 series · 33 of 48 positions shown · IV contrast (gadavist)
Comparison: Previous exams.
COMPARISON: Previous exams.

Addendum:
CLINICAL DATA: Patient with indeterminate enhancing focus within
the upper-outer quadrant the RIGHT breast presents today for MRI
guided biopsy

EXAM:
MRI GUIDED CORE NEEDLE BIOPSY OF THE RIGHT BREAST
TECHNIQUE: Multiplanar, multisequence MR imaging of the RIGHT breast was
performed both before and after administration of intravenous
contrast.
CONTRAST:  10mL GADAVIST GADOBUTROL 1 MMOL/ML IV SOLN

[Series 2: fiducial unilateral · sagittal · 2.0mm · 1.33mm/px · 3 of 52 slices shown]
[im 1/52]
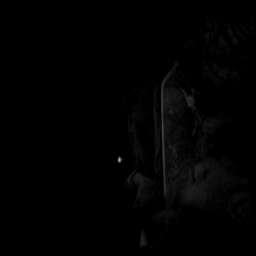
[im 26/52]
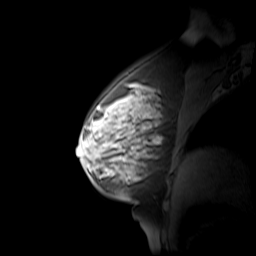
[im 52/52]
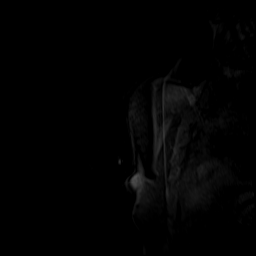

[Series 3: dynamic pre · axial · non-contrast · 1.3mm · 0.73mm/px · z∈[-92,+93]mm · 5 of 144 slices shown]
[im 1/144]
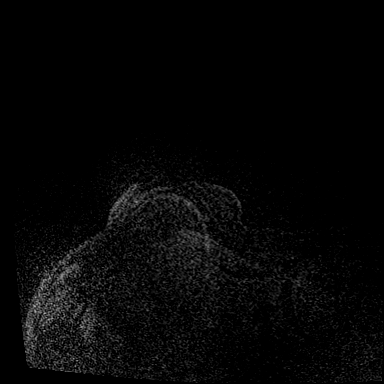
[im 36/144]
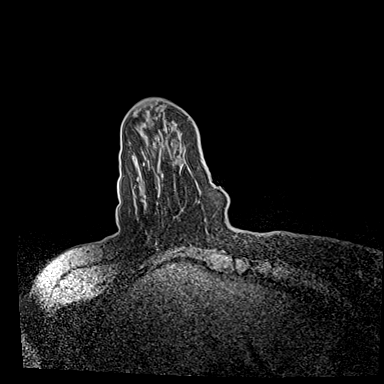
[im 72/144]
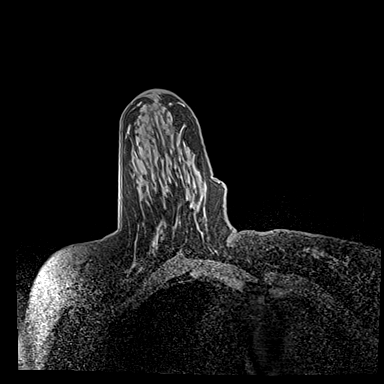
[im 108/144]
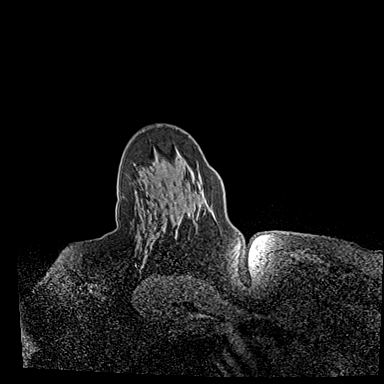
[im 144/144]
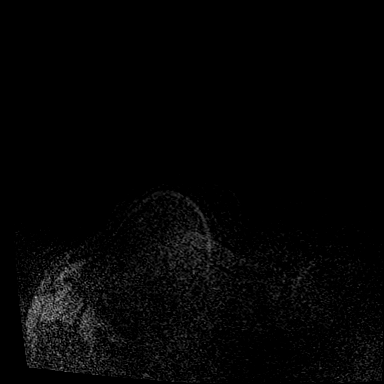

[Series 4: dynamic post 20 · axial · 1.3mm · 0.73mm/px · z∈[-92,+93]mm · 4 of 144 slices shown (1 of 2)]
[im 1/144]
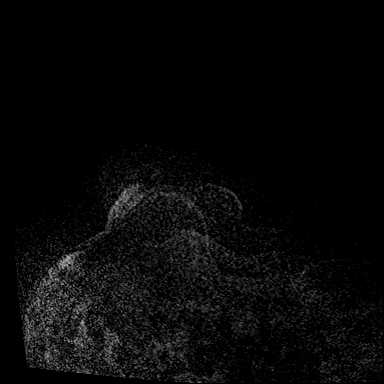
[im 48/144]
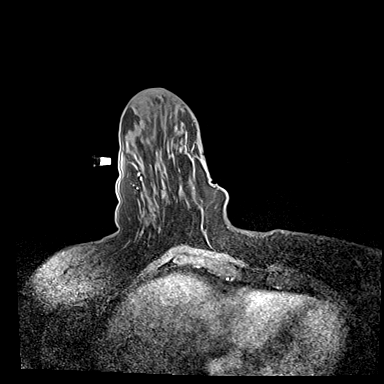
[im 96/144]
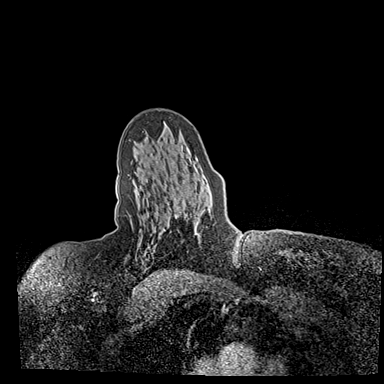
[im 144/144]
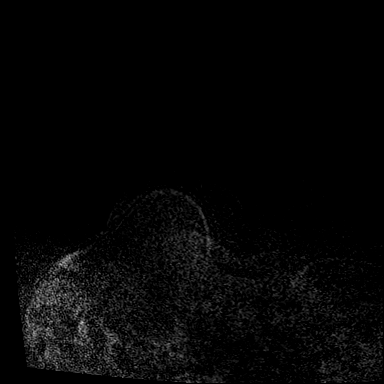

[Series 5: dynamic post 20 · axial · 1.3mm · 0.73mm/px · z∈[-92,+93]mm · 4 of 144 slices shown (2 of 2)]
[im 1/144]
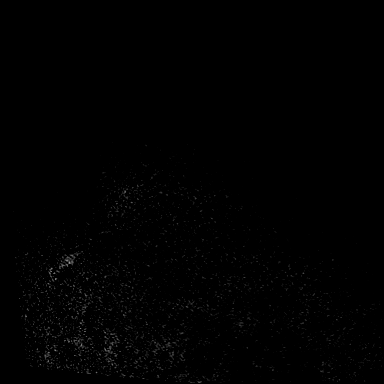
[im 48/144]
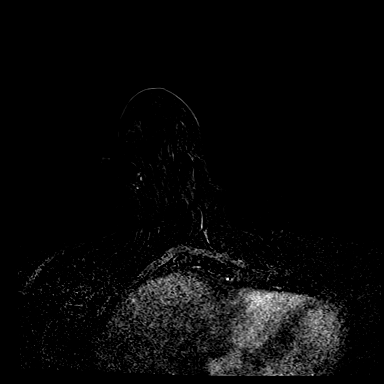
[im 96/144]
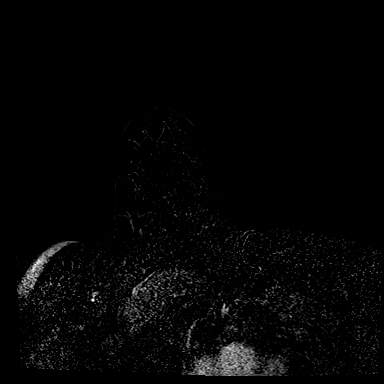
[im 144/144]
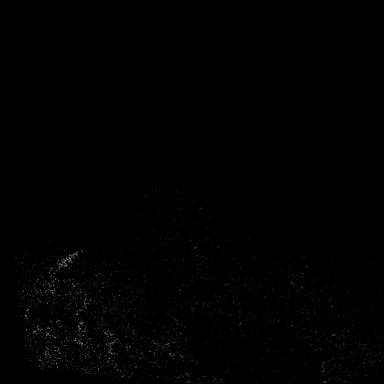

[Series 6: dynamic post 3 · axial · 1.3mm · 0.73mm/px · z∈[-92,+93]mm · 4 of 144 slices shown (1 of 2)]
[im 1/144]
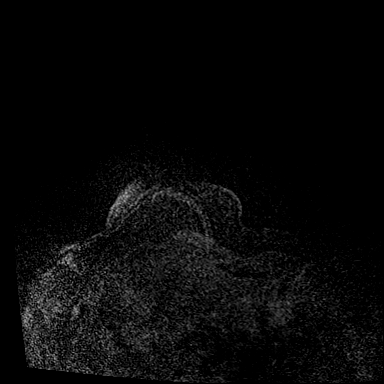
[im 48/144]
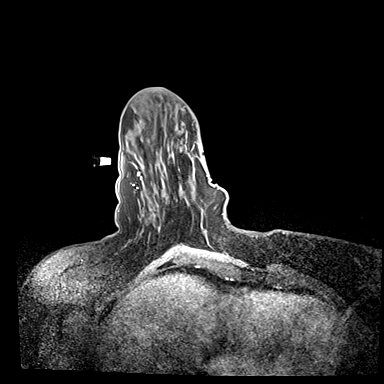
[im 96/144]
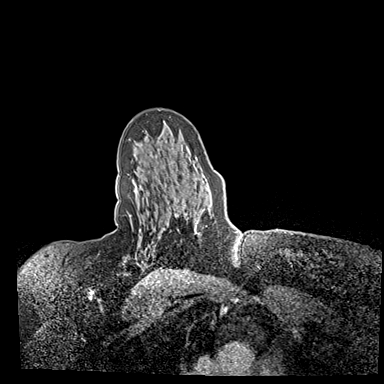
[im 144/144]
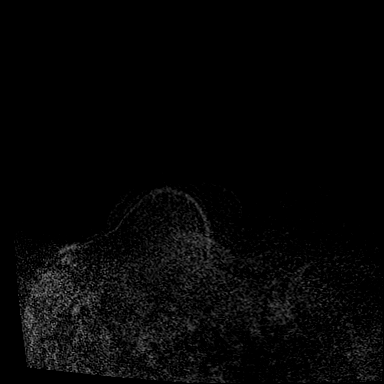

[Series 7: dynamic post 3 · axial · 1.3mm · 0.73mm/px · z∈[-92,+93]mm · 4 of 144 slices shown (2 of 2)]
[im 1/144]
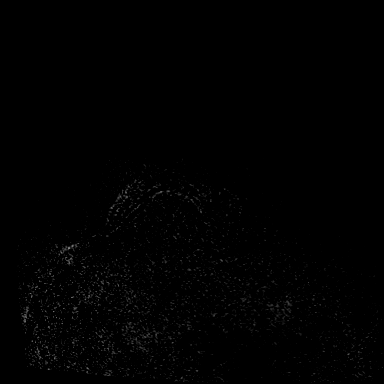
[im 48/144]
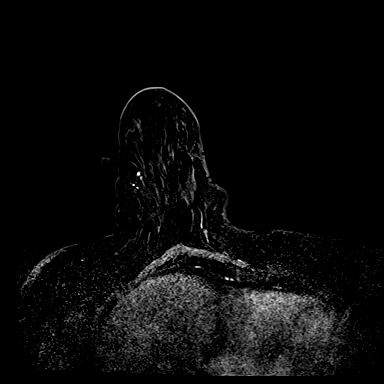
[im 96/144]
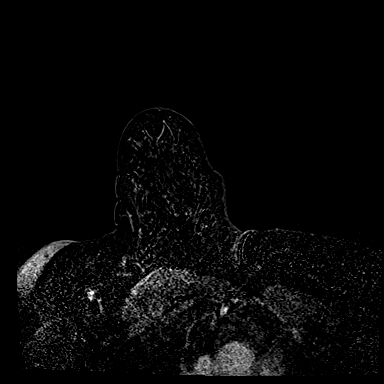
[im 144/144]
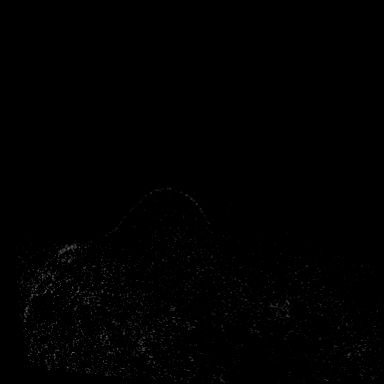

[Series 8: post bx · axial · 1.3mm · 0.73mm/px · z∈[-92,+93]mm · 4 of 144 slices shown]
[im 1/144]
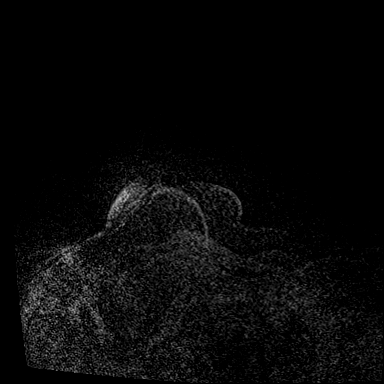
[im 48/144]
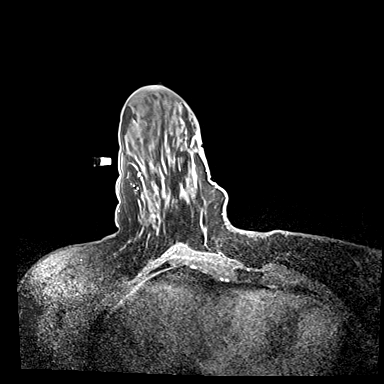
[im 96/144]
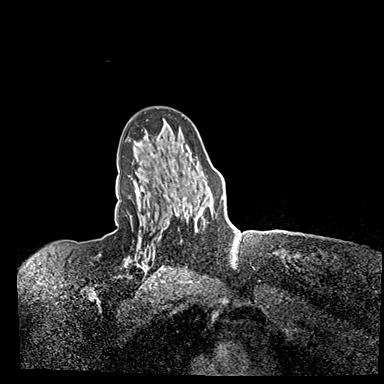
[im 144/144]
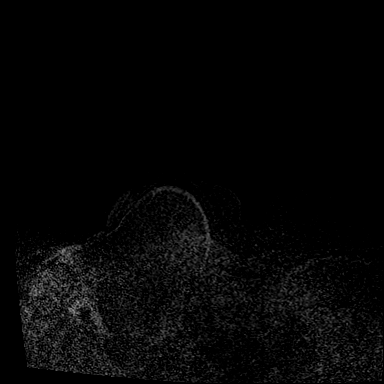

[Series 9: post bx_sub · axial · 1.3mm · 0.73mm/px · z∈[-92,+93]mm · 4 of 144 slices shown]
[im 1/144]
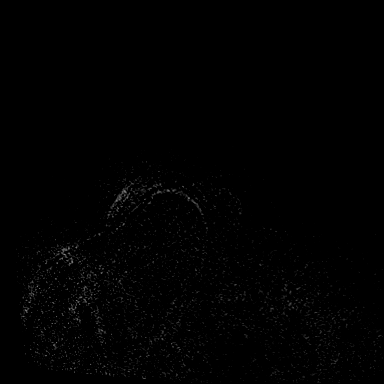
[im 48/144]
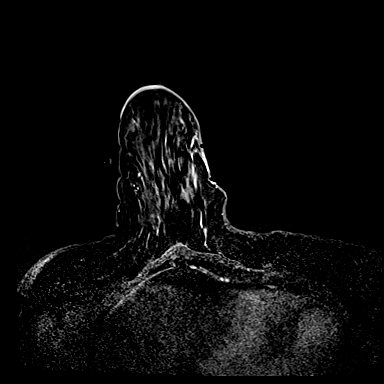
[im 96/144]
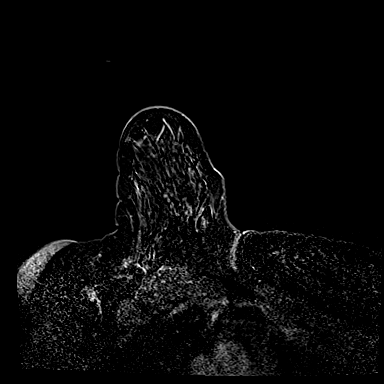
[im 144/144]
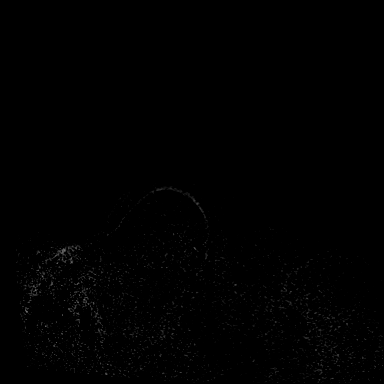

[Series 10: post clip · axial · 1.3mm · 0.73mm/px · 1 of 144 slices shown]
[im 1/144]
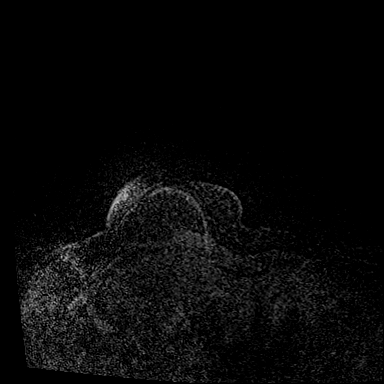

[33 of 48 positions shown; findings below may reference images not displayed]

FINDINGS: I met with the patient, and we discussed the procedure of MRI guided
biopsy, including risks, benefits, and alternatives. Specifically,
we discussed the risks of infection, bleeding, tissue injury, clip
migration, and inadequate sampling. Informed, written consent was
given. The usual time out protocol was performed immediately prior
to the procedure.

Using sterile technique, 1% Lidocaine, MRI guidance, and a 9 gauge
vacuum assisted device, biopsy was performed of the enhancing focus
within the upper-outer quadrant of the RIGHT breast using a lateral
approach. At the conclusion of the procedure, a barbell shaped
tissue marker clip was deployed into the biopsy cavity. Follow-up
2-view mammogram was performed and dictated separately.
IMPRESSION: MRI guided biopsy of the indeterminate enhancing focus within the
upper-outer quadrant of the RIGHT breast. No apparent complications.

ADDENDUM:
Pathology revealed FIBROCYSTIC CHANGE WITH HYALINE FIBROSIS-
NEGATIVE FOR CARCINOMA of the RIGHT breast, upper outer quadrant,
barbell clip. This was found to be concordant by Dr. MARYSIAULL.

Pathology results were discussed with the patient by telephone. The
patient reported doing well after the biopsy with tenderness at the
site. Post biopsy instructions and care were reviewed and questions
were answered. The patient was encouraged to call The [REDACTED]

Bilateral breast MRI recommended in 6 months per protocol.

Pathology results reported by MARYSIAULL RN on [DATE].

*** End of Addendum ***
FINDINGS: I met with the patient, and we discussed the procedure of MRI guided
biopsy, including risks, benefits, and alternatives. Specifically,
we discussed the risks of infection, bleeding, tissue injury, clip
migration, and inadequate sampling. Informed, written consent was
given. The usual time out protocol was performed immediately prior
to the procedure.

Using sterile technique, 1% Lidocaine, MRI guidance, and a 9 gauge
vacuum assisted device, biopsy was performed of the enhancing focus
within the upper-outer quadrant of the RIGHT breast using a lateral
approach. At the conclusion of the procedure, a barbell shaped
tissue marker clip was deployed into the biopsy cavity. Follow-up
2-view mammogram was performed and dictated separately.
IMPRESSION: MRI guided biopsy of the indeterminate enhancing focus within the
upper-outer quadrant of the RIGHT breast. No apparent complications.

## 2020-09-13 IMAGING — MG MM BREAST LOCALIZATION CLIP
4 series · 4 of 12 positions shown · non-contrast
Comparison: Previous exam(s).

CLINICAL DATA: Status post MRI guided biopsy of an indeterminate
enhancing focus within the upper-outer quadrant of the RIGHT breast.

EXAM:
3D DIAGNOSTIC RIGHT MAMMOGRAM POST MRI BIOPSY

[R CC synth-2D]
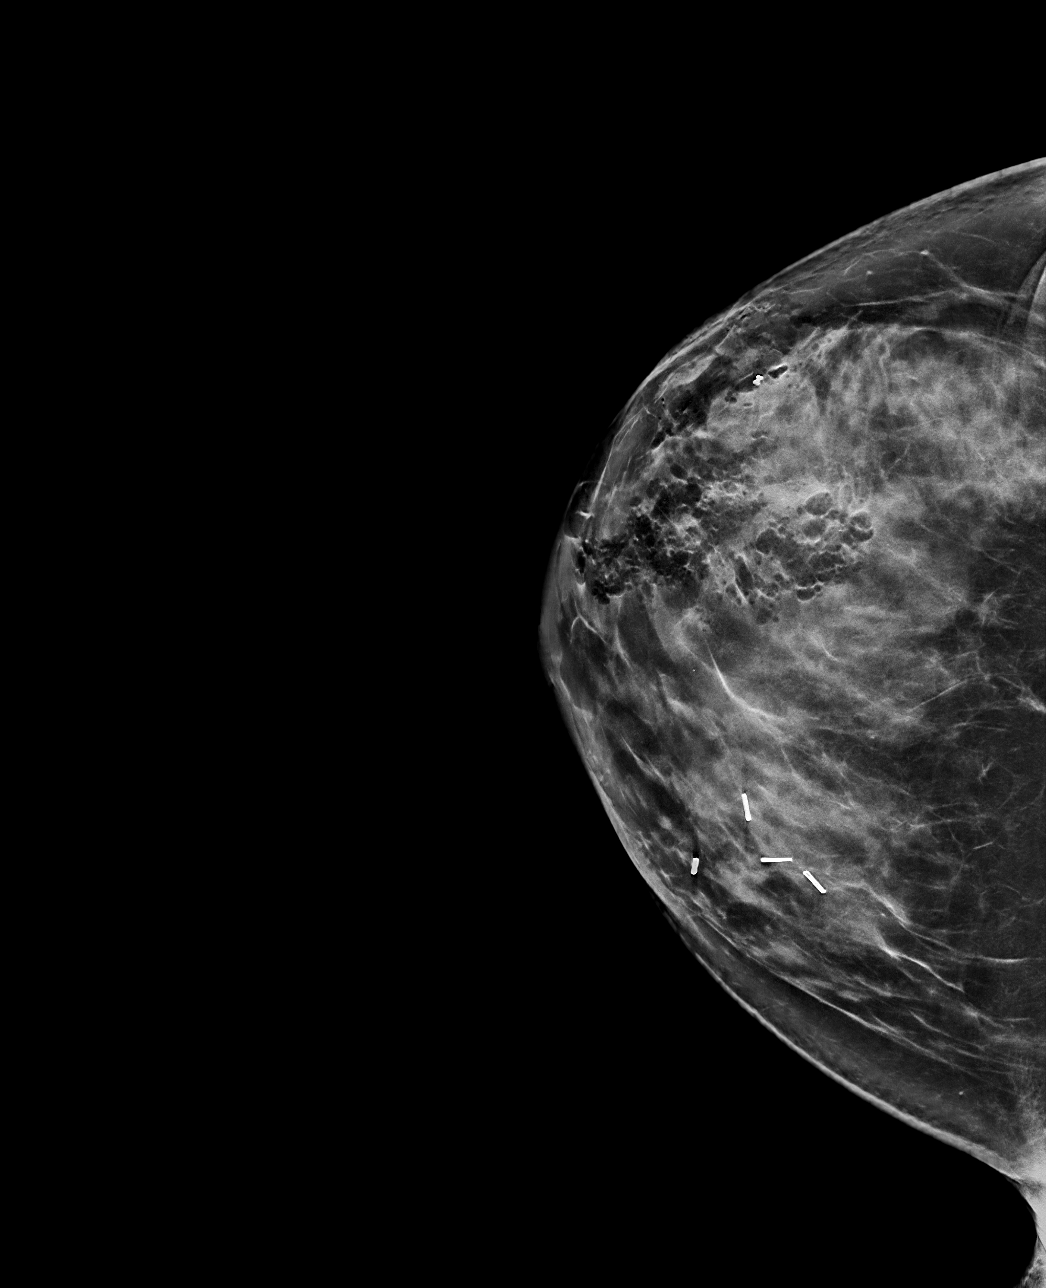

[R ML synth-2D]
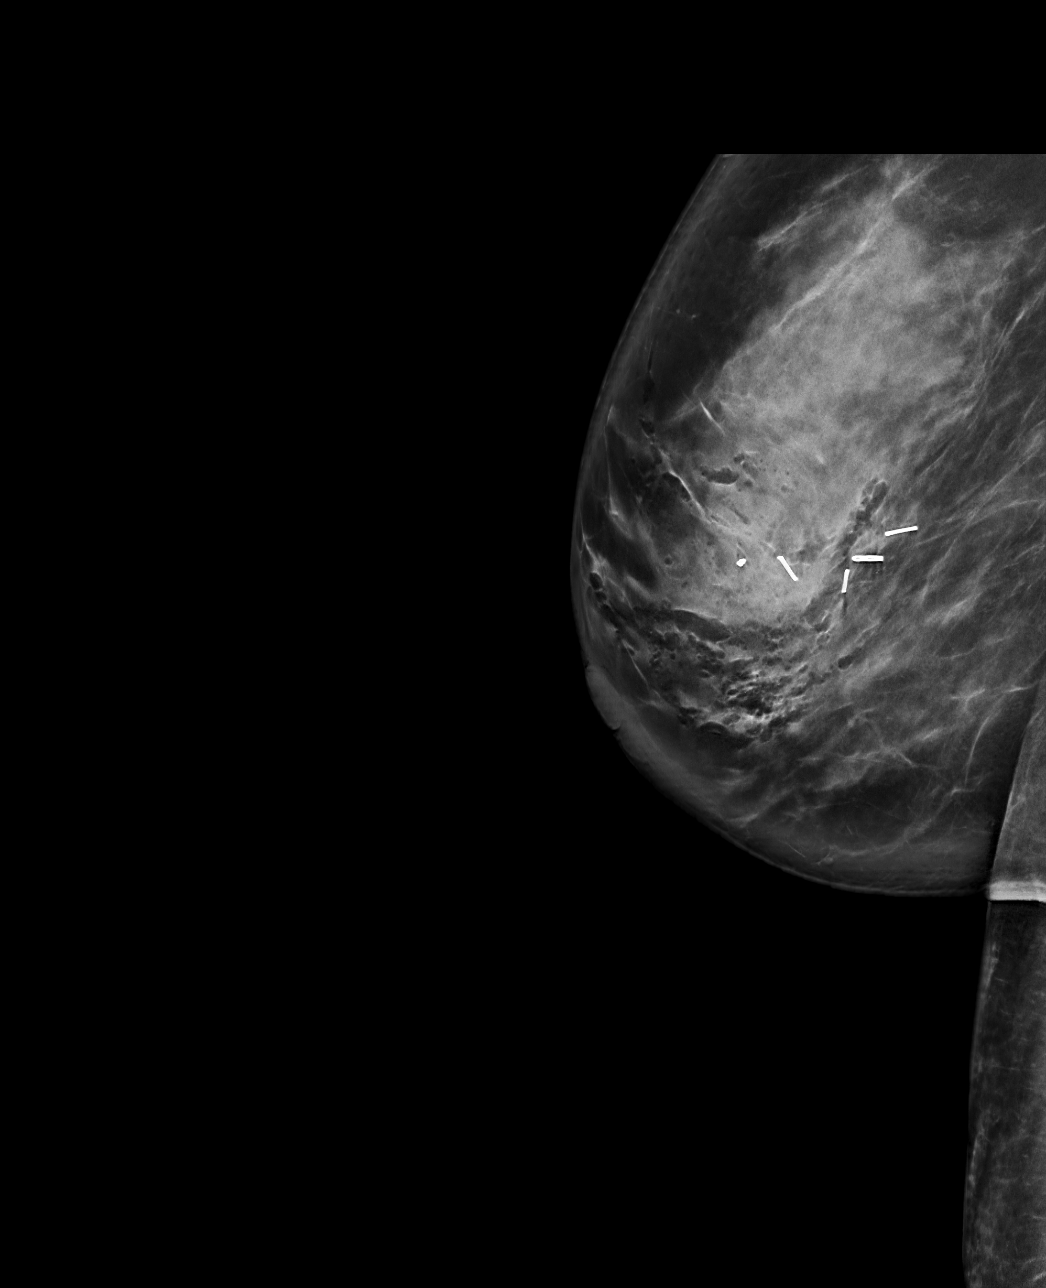

[R ML tomo · tomo slice 54/107.0]
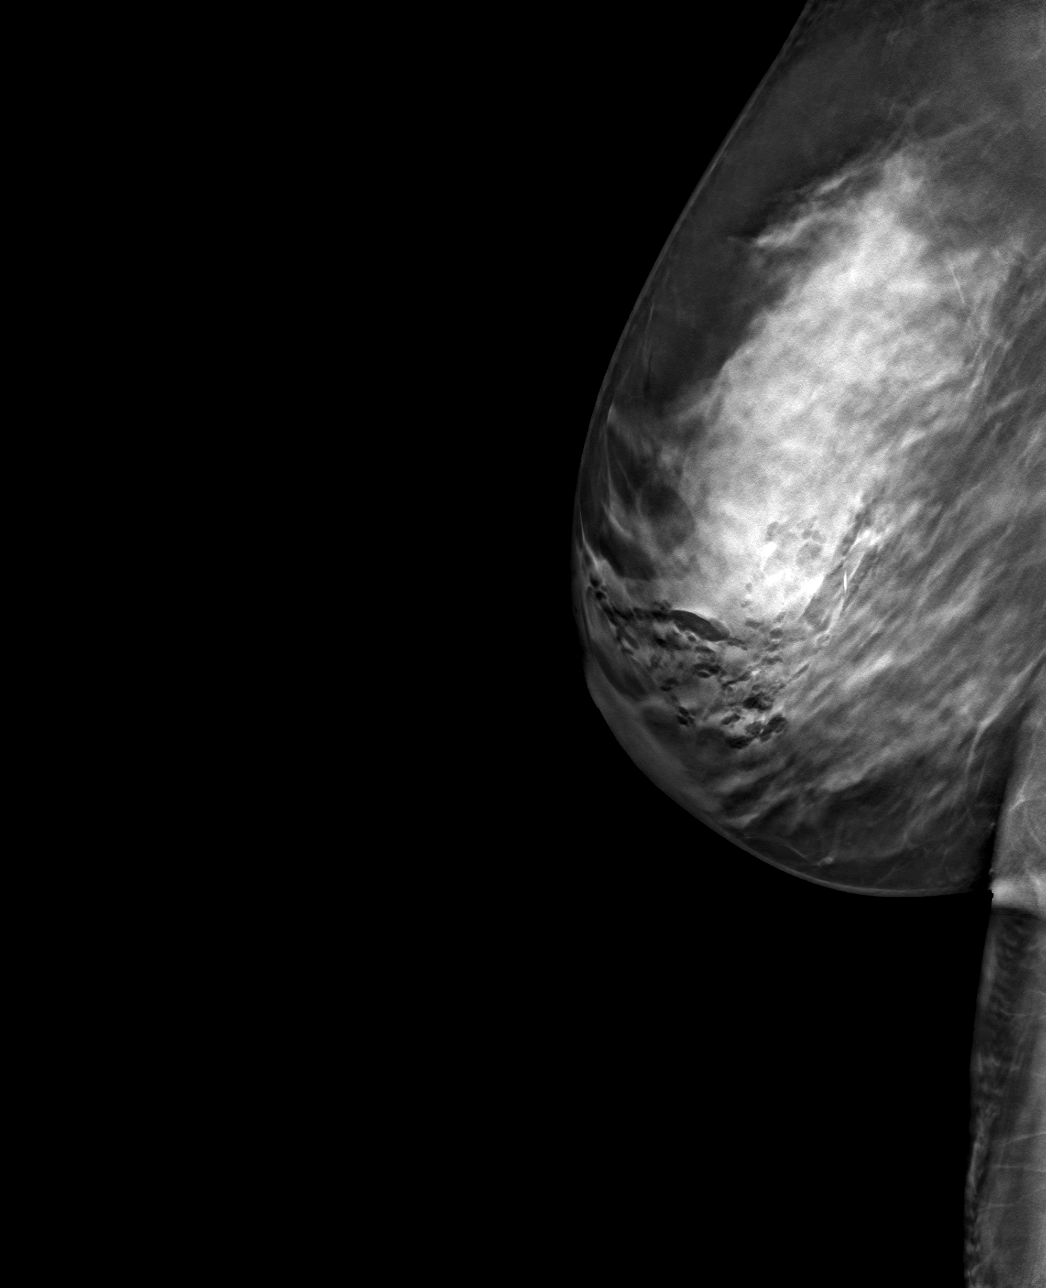

[R CC tomo · tomo slice 48/95.0]
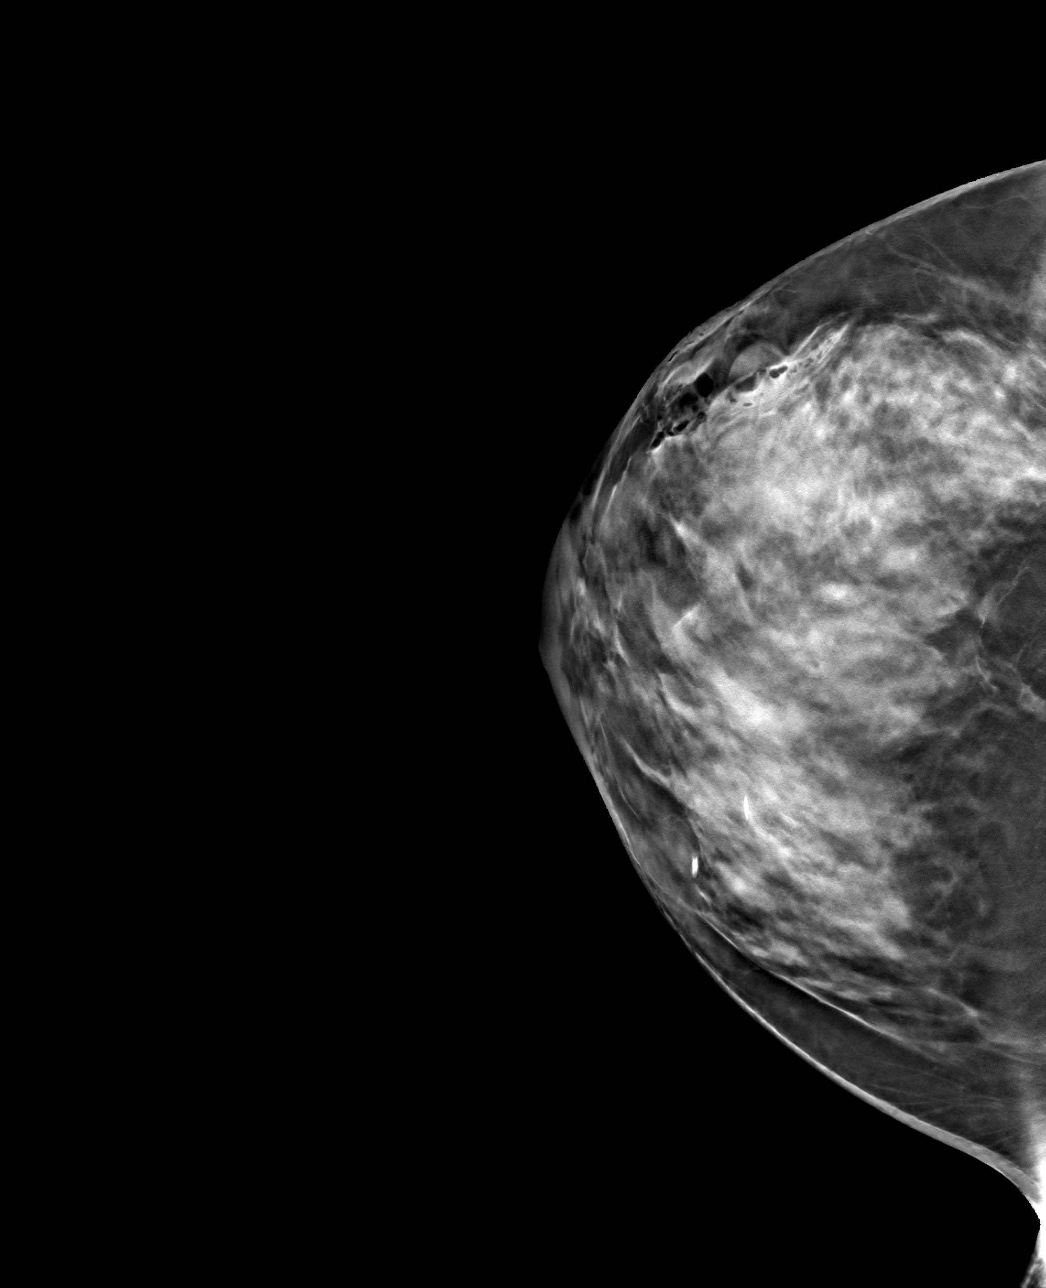

[4 of 12 positions shown; findings below may reference images not displayed]

FINDINGS: 3D Mammographic images were obtained following MRI guided biopsy of
the indeterminate enhancing focus within the upper-outer quadrant of
the RIGHT breast. The biopsy marking clip is in expected position at
the site of biopsy.
IMPRESSION: Appropriate positioning of the barbell shaped biopsy marking clip at
the site of biopsy in the upper-outer quadrant of the RIGHT breast.

Final Assessment: Post Procedure Mammograms for Marker Placement

## 2020-09-13 MED ORDER — GADOBUTROL 1 MMOL/ML IV SOLN
10.0000 mL | Freq: Once | INTRAVENOUS | Status: AC | PRN
  Administered 2020-09-13: 10 mL via INTRAVENOUS

## 2020-09-18 ENCOUNTER — Ambulatory Visit: Payer: BC Managed Care – PPO

## 2020-09-18 ENCOUNTER — Encounter: Payer: Self-pay | Admitting: Oncology

## 2020-09-25 ENCOUNTER — Other Ambulatory Visit: Payer: Self-pay | Admitting: Nurse Practitioner

## 2020-09-25 DIAGNOSIS — E119 Type 2 diabetes mellitus without complications: Secondary | ICD-10-CM

## 2020-09-26 DIAGNOSIS — H2513 Age-related nuclear cataract, bilateral: Secondary | ICD-10-CM | POA: Diagnosis not present

## 2020-09-26 DIAGNOSIS — H25013 Cortical age-related cataract, bilateral: Secondary | ICD-10-CM | POA: Diagnosis not present

## 2020-09-26 DIAGNOSIS — H25043 Posterior subcapsular polar age-related cataract, bilateral: Secondary | ICD-10-CM | POA: Diagnosis not present

## 2020-09-26 DIAGNOSIS — H2511 Age-related nuclear cataract, right eye: Secondary | ICD-10-CM | POA: Diagnosis not present

## 2020-09-26 DIAGNOSIS — H18413 Arcus senilis, bilateral: Secondary | ICD-10-CM | POA: Diagnosis not present

## 2020-10-16 DIAGNOSIS — G4733 Obstructive sleep apnea (adult) (pediatric): Secondary | ICD-10-CM | POA: Diagnosis not present

## 2020-10-17 ENCOUNTER — Other Ambulatory Visit: Payer: Self-pay | Admitting: Nurse Practitioner

## 2020-10-30 DIAGNOSIS — Z20822 Contact with and (suspected) exposure to covid-19: Secondary | ICD-10-CM | POA: Diagnosis not present

## 2020-11-09 DIAGNOSIS — Z20822 Contact with and (suspected) exposure to covid-19: Secondary | ICD-10-CM | POA: Diagnosis not present

## 2020-11-13 ENCOUNTER — Ambulatory Visit: Payer: BC Managed Care – PPO

## 2020-11-13 ENCOUNTER — Other Ambulatory Visit: Payer: Self-pay

## 2020-11-13 ENCOUNTER — Ambulatory Visit (INDEPENDENT_AMBULATORY_CARE_PROVIDER_SITE_OTHER): Payer: BC Managed Care – PPO | Admitting: Cardiovascular Disease

## 2020-11-13 ENCOUNTER — Encounter (HOSPITAL_BASED_OUTPATIENT_CLINIC_OR_DEPARTMENT_OTHER): Payer: Self-pay | Admitting: Cardiovascular Disease

## 2020-11-13 DIAGNOSIS — R0789 Other chest pain: Secondary | ICD-10-CM

## 2020-11-13 DIAGNOSIS — R000 Tachycardia, unspecified: Secondary | ICD-10-CM

## 2020-11-13 HISTORY — DX: Tachycardia, unspecified: R00.0

## 2020-11-13 NOTE — Progress Notes (Unsigned)
Patient enrolled for [employer] to mail a 3 day ZIO XT monitor to her address on file

## 2020-11-13 NOTE — Patient Instructions (Signed)
Medication Instructions:  Your physician recommends that you continue on your current medications as directed. Please refer to the Current Medication list given to you today.   *If you need a refill on your cardiac medications before your next appointment, please call your pharmacy*  Lab Work: NONE    Testing/Procedures: 3 DAY ZIO THIS WILL BE MAILED TO YOU   Your physician has requested that you have an echocardiogram. Echocardiography is a painless test that uses sound waves to create images of your heart. It provides your doctor with information about the size and shape of your heart and how well your heart's chambers and valves are working. This procedure takes approximately one hour. There are no restrictions for this procedure.  Follow-Up: At [facility], you and your health needs are our priority.  As part of our continuing mission to provide you with exceptional heart care, we have created designated Provider Care Teams.  These Care Teams include your primary Cardiologist (physician) and Advanced Practice Providers (APPs -  Physician Assistants and Nurse Practitioners) who all work together to provide you with the care you need, when you need it.  We recommend signing up for the patient portal called "MyChart".  Sign up information is provided on this After Visit Summary.  MyChart is used to connect with patients for Virtual Visits (Telemedicine).  Patients are able to view lab/test results, encounter notes, upcoming appointments, etc.  Non-urgent messages can be sent to your provider as well.   To learn more about what you can do with MyChart, go to [email or web address].    Your next appointment:   3 month(s)  The format for your next appointment:   In Person  Provider:   Skeet Latch, MD  Other Instructions  Bryn Gulling- Long Term Monitor Instructions  Your physician has requested you wear a ZIO patch monitor for 3 days.  This is a single patch monitor. [employer]  supplies one patch monitor per enrollment. Additional stickers are not available. Please do not apply patch if you will be having a Nuclear Stress Test,  Echocardiogram, Cardiac CT, MRI, or Chest Xray during the period you would be wearing the  monitor. The patch cannot be worn during these tests. You cannot remove and re-apply the  ZIO XT patch monitor.  Your ZIO patch monitor will be mailed 3 day USPS to your address on file. It may take 3-5 days  to receive your monitor after you have been enrolled.  Once you have received your monitor, please review the enclosed instructions. Your monitor  has already been registered assigning a specific monitor serial # to you.  Billing and Patient Assistance Program Information  We have supplied [employer] with any of your insurance information on file for billing purposes. [employer] offers a sliding scale Patient Assistance Program for patients that do not have  insurance, or whose insurance does not completely cover the cost of the ZIO monitor.  You must apply for the Patient Assistance Program to qualify for this discounted rate.  To apply, please call [employer] at [phone], select option 4, select option 2, ask to apply for  Patient Assistance Program. Theodore Demark will ask your household income, and how many people  are in your household. They will quote your out-of-pocket cost based on that information.  [employer] will also be able to set up a 62-month, interest-free payment plan if needed.  Applying the monitor   Shave hair from upper left chest.  Hold abrader disc by orange tab. Rub  abrader in 40 strokes over the upper left chest as  indicated in your monitor instructions.  Clean area with 4 enclosed alcohol pads. Let dry.  Apply patch as indicated in monitor instructions. Patch will be placed under collarbone on left  side of chest with arrow pointing upward.  Rub patch adhesive wings for 2 minutes. Remove white label marked "1". Remove the white   label marked "2". Rub patch adhesive wings for 2 additional minutes.  While looking in a mirror, press and release button in center of patch. A small green light will  flash 3-4 times. This will be your only indicator that the monitor has been turned on.  Do not shower for the first 24 hours. You may shower after the first 24 hours.  Press the button if you feel a symptom. You will hear a small click. Record Date, Time and  Symptom in the Patient Logbook.  When you are ready to remove the patch, follow instructions on the last 2 pages of Patient  Logbook. Stick patch monitor onto the last page of Patient Logbook.  Place Patient Logbook in the blue and white box. Use locking tab on box and tape box closed  securely. The blue and white box has prepaid postage on it. Please place it in the mailbox as  soon as possible. Your physician should have your test results approximately 7 days after the  monitor has been mailed back to [facility]  Call [place] at [phone] if you have questions regarding  your ZIO XT patch monitor. Call them immediately if you see an orange light blinking on your  monitor.  If your monitor falls off in less than 4 days, contact our Monitor department at [phone].  If your monitor becomes loose or falls off after 4 days call [employer] at [phone] for  suggestions on securing your monitor

## 2020-11-13 NOTE — Progress Notes (Signed)
Cardiology Office Note:    Date:  11/13/2020   ID:  Vanessa Allen, DOB [birth year], MRN [id number]  PCP:  [name], FNP   [facility] Providers Cardiologist:  None     Referring MD: [name], FNP   No chief complaint on file.   History of Present Illness:    Vanessa Allen is a 55 y.o. female with a hx of arthritis, diabetes type 2, and breast cancer, and OSA on CPAP, atherosclerosis of the aorta, and hyperlipidemia. She saw her PCP 06/2020 and was noted to be tachycardic. She was also reported to have exertional dyspnea and was referred to cardiology.  Today, she has been doing alright. Since a couple weeks from her previous appointment 2 months ago, she has been experiencing tachycardiac episodes where her heart rate rises to 140-150 bpm at rest. The increased heart rate does not worsen or improve with exertion. She has not noticed these episodes as much recently because she as been busy at work. When these episodes occur, sometimes she also experiences shortness of breath. However, she does not feel physically limited by her episodes. She believes these episodes have been related to her radiation treatments a year ago. She reports LLE edema that persists after her ablation 11/2019. Additionally, she also reports sporadic chest pain that occurs a couple times a year and last about 1 minute. This pain is not reproducible with palpation and could occur at work or while resting. 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 For her diet, she stays away from caffeine because it can cause her palpitations and occasionally drinks alcohol but limits consumption to a small unit. She also does not smoke. She denies lightheadedness, headaches, syncope, orthopnea, or PND. Also has no exertional symptoms.   Past Medical History:  Diagnosis Date   Arthritis    knees and back    BRCA2 gene mutation positive in female     Cancer [facility])    Right Breast Cancer   Complication of anesthesia    Diabetes mellitus without complication ([place])    type 2    Endometriosis    Family history of adverse reaction to anesthesia    brother has issues with n/v after surgery    Family history of BRCA gene mutation    Family history of breast cancer    Family history of leukemia    Family history of ovarian cancer    Family history of stomach cancer    Fibroid uterus    GERD (gastroesophageal reflux disease)    Personal history of radiation therapy    PONV (postoperative nausea and vomiting)    Tachycardia 11/13/2020   Venous insufficiency of lower extremity     Past Surgical History:  Procedure Laterality Date   APPENDECTOMY     BREAST BIOPSY Right 08/25/2019   BREAST LUMPECTOMY Right 09/23/2019   BREAST LUMPECTOMY WITH RADIOACTIVE SEED AND SENTINEL LYMPH NODE BIOPSY Right 09/23/2019   Procedure: RIGHT BREAST LUMPECTOMY WITH RADIOACTIVE SEED AND SENTINEL LYMPH NODE MAPPING;  Surgeon: Erroll Luna, MD;  Location: [place];  Service: General;  Laterality: Right;  PEC BLOCK   CHOLECYSTECTOMY     lsc chole cystectomy and appendectomy 1 month after myomectomy   CYST EXCISION Left 12/13/2019   Procedure: Excision left dorsal wrist ganglion cyst;  Surgeon: Cindra Presume, MD;  Location: [place];  Service: Plastics;  Laterality: Left;  45 min   ENDOVENOUS ABLATION SAPHENOUS VEIN W/ LASER Left 12/30/2019   endovenous laser ablation left greater saphenous vein by Gae Gallop MD    Lipoma removal Left    Left Lower Back   MYOMECTOMY     ROBOTIC ASSISTED SALPINGO OOPHERECTOMY Bilateral 05/23/2020   Procedure: XI ROBOTIC ASSISTED SALPINGO OOPHORECTOMY ;ENTEROLYSIS;  Surgeon: Lafonda Mosses, MD;  Location: [facility];  Service: Gynecology;  Laterality: Bilateral;   WISDOM TOOTH EXTRACTION      Current Medications: Current Meds  Medication Sig   ACCU-CHEK GUIDE test strip USE TWICE DAILY TO CHECK BLOOD  SUGAR BEFORE BREAKFAST AND DINNER   acidophilus (RISAQUAD) CAPS capsule Take 1 capsule by mouth daily.   ALPRAZolam (XANAX) 0.5 MG tablet TAKE 1 TABLET(0.5 MG) BY MOUTH THREE TIMES DAILY AS NEEDED FOR ANXIETY   amitriptyline (ELAVIL) 25 MG tablet Take 1 tablet (25 mg total) by mouth at bedtime.   aspirin EC 81 MG tablet Take 81 mg by mouth daily. Swallow whole.   atorvastatin (LIPITOR) 10 MG tablet TAKE 1 TABLET(10 MG) BY MOUTH EVERY 7 DAYS   Biotin w/ Vitamins C & E (HAIR/SKIN/NAILS PO) Take 1 tablet by mouth daily.   Coenzyme Q10 (CO Q-10) 100 MG CAPS Take by mouth.   Continuous Blood Gluc Sensor (FREESTYLE LIBRE 14 DAY SENSOR) MISC USE TO MEASURE BLOOD GLUCOSE FOUR TIMES DAILY BEFORE MEALS AND AT BEDTIME   esomeprazole (NEXIUM) 20 MG capsule TAKE 1 CAPSULE(20 MG) BY MOUTH DAILY   gabapentin (NEURONTIN) 300 MG capsule Take 1 capsule (300 mg total) by mouth at bedtime.   Glucose Blood (GLUCOSE METER TEST VI) by In Vitro route 2 (two) times daily.   metFORMIN (GLUCOPHAGE) 500 MG tablet Take 1 tablet (500 mg total) by mouth daily with breakfast. TAKE 1 TABLET BY MOUTH EVERY DAY WITH BREAKFAST   Multiple Vitamin (MULTIVITAMIN WITH MINERALS) TABS tablet Take 1 tablet by mouth daily.   Omega-3 1000 MG CAPS Take 1,000 mg by mouth daily.   RYBELSUS 14 MG TABS TAKE 1 TABLET BY MOUTH DAILY 30 MINUTES BEFORE BREAKFAST   Suvorexant (BELSOMRA) 10 MG TABS Take 1 tablet by mouth at bedtime as needed.   tamoxifen (NOLVADEX) 20 MG tablet Take 20 mg by mouth daily.   Turmeric (QC TUMERIC COMPLEX PO) Take 1,000 mg by mouth daily.     Allergies:   Patient has no known allergies.   Social History   Socioeconomic History   Marital status: Single    Spouse name: Not on file   Number of children: Not on file   Years of education: Not on file   Highest education level: Not on file  Occupational History   Occupation: travel nurse  Tobacco Use   Smoking status: Never   Smokeless tobacco: Never  Vaping Use    Vaping Use: Never used  Substance and Sexual Activity   Alcohol use: Yes    Alcohol/week: 0.0 standard drinks    Comment: rare    Drug use: Never   Sexual activity: Not Currently  Other Topics Concern   Not on file  Social History Narrative   Not on file   Social Determinants of Health   Financial Resource Strain: Low Risk    Difficulty of Paying Living Expenses: Not hard at all  Food Insecurity: No Food Insecurity   Worried About Charity fundraiser in the Last Year: Never true   House in the Last Year: Never true  Transportation Needs: No Data processing manager (Medical): No   Lack of Transportation (Non-Medical): No  Physical Activity: Inactive   Days of Exercise per Week: 0 days   Minutes of Exercise per Session: 0 min  Stress: Not on file  Social Connections: Not on file     Family History: The patient's family history includes BRCA 1/2 in her niece; Breast cancer (age of onset: 61) in her niece; CAD in her father; Diabetes in her father; Heart disease in her father; Heart failure in her mother; Hyperlipidemia in her mother; Hypertension in her brother and mother; Leukemia in her paternal aunt; Ovarian cancer in her cousin, cousin, and cousin; Stomach cancer (age of onset: 77) in her father; Varicose Veins in her mother. There is no history of Colon cancer, Pancreatic cancer, or Prostate cancer.  ROS:   Please see the history of present illness.   (+) Chest pain (+) Shortness of breath (+) Palpitations (+) LLE Edema   All other systems reviewed and are negative.  EKGs/Labs/Other Studies Reviewed:    The following studies were reviewed today:  Lower Venous DVT 02/17/2020 Post Ablation Summary:  - No evidence of deep vein thrombosis from the left common femoral through  the popliteal veins.  - Successful ablation of the right Great Saphenous Vein from the Knee to  1.05cm from the SFJ.   EKG:     11/13/2020: Sinus rhythm Rate 92  Non-Specific ST Changes  Recent Labs: 06/27/2020: TSH 0.580 07/05/2020: ALT 42; BUN 15; Creatinine, Ser 0.87; Potassium 3.6; Sodium 140 07/27/2020: Hemoglobin 12.5; Platelets 243  Recent Lipid Panel    Component Value Date/Time   CHOL 109 07/27/2020 0939   TRIG 73 07/27/2020 0939   HDL 41 07/27/2020 0939   CHOLHDL 2.7 07/27/2020 0939   LDLCALC 53 07/27/2020 0939        Physical Exam:    VS:  BP 128/90 (BP Location: Left Arm, Patient Position: Sitting, Cuff Size: Large)   Pulse 92   Ht '5\' 6"'  (1.676 m)   Wt 173 lb 1.6 oz (78.5 kg)   BMI 27.94 kg/m  , BMI Body mass index is 27.94 kg/m. GENERAL:  Well appearing HEENT: Pupils equal round and reactive, fundi not visualized, oral mucosa unremarkable NECK:  No jugular venous distention, waveform within normal limits, carotid upstroke brisk and symmetric, no bruits LUNGS:  Clear to auscultation bilaterally HEART:  RRR.  PMI not displaced or sustained,S1 and S2 within normal limits, no S3, no S4, no clicks, no rubs, no murmurs ABD:  Flat, positive bowel sounds normal in frequency in pitch, no bruits, no rebound, no guarding, no midline pulsatile mass, no hepatomegaly, no splenomegaly EXT:  2 plus pulses throughout, no edema, no cyanosis no clubbing SKIN:  No rashes no nodules NEURO:  Cranial nerves II through XII grossly intact, motor grossly intact throughout PSYCH:  Cognitively intact, oriented to person place and time   ASSESSMENT:    1. Atypical chest pain   2. Tachycardia    PLAN:   Atypical chest pain Symptoms are very atypical and never with exertion.  She does note that she has been told she has atherosclerosis but lipids are well-controlled on atorvastatin.  No changes for now and no ischemia work-up indicated at this time.  Tachycardia Ms. Hassey has been intermittently tachycardic at multiple visits over the last couple years.  She notes that this started after radiation therapy for breast cancer.  She did not receive any  chemotherapy.  She notes that when her heart rate starts to get very fast she is a little short of breath.  Thyroid testing and blood counts have been unremarkable.  She does not drink caffeine or alcohol.  She is not anxious.  I suspect this is inappropriate sinus tachycardia.  Her heart rate prior to being treated was in the 70s to 80s.  We did discuss trying beta-blockers, but given that she is minimally symptomatic she is not interested in any long-term medications.  We will get an echocardiogram to assess for structural heart disease.  We will also get a 3-day ZIO to make sure she does not have any other arrhythmias.   Disposition: FU with Ronica Vivian C. Oval Linsey, MD, [facility] in 2-3 months  Medication Adjustments/Labs and Tests Ordered: Current medicines are reviewed at length with the patient today.  Concerns regarding medicines are outlined above.  Orders Placed This Encounter  Procedures   LONG TERM MONITOR (3-14 DAYS)   EKG 12-Lead   ECHOCARDIOGRAM COMPLETE    No orders of the defined types were placed in this encounter.   Patient Instructions  Medication Instructions:  Your physician recommends that you continue on your current medications as directed. Please refer to the Current Medication list given to you today.   *If you need a refill on your cardiac medications before your next appointment, please call your pharmacy*  Lab Work: NONE    Testing/Procedures: 3 DAY ZIO THIS WILL BE MAILED TO YOU   Your physician has requested that you have an echocardiogram. Echocardiography is a painless test that uses sound waves to create images of your heart. It provides your doctor with information about the size and shape of your heart and how well your heart's chambers and valves are working. This procedure takes approximately one hour. There are no restrictions for this procedure.  Follow-Up: At [facility], you and your health needs are our priority.  As part of our continuing mission  to provide you with exceptional heart care, we have created designated Provider Care Teams.  These Care Teams include your primary Cardiologist (physician) and Advanced Practice Providers (APPs -  Physician Assistants and Nurse Practitioners) who all work together to provide you with the care you need, when you need it.  We recommend signing up for the patient portal called "MyChart".  Sign up information is provided on this After Visit Summary.  MyChart is used to connect with patients for Virtual Visits (Telemedicine).  Patients are able to view lab/test results, encounter notes, upcoming appointments, etc.  Non-urgent messages can be sent to your provider as well.   To learn more about what you can do with MyChart, go to [email or web address].    Your next appointment:   3 month(s)  The format for your next appointment:   In Person  Provider:   Skeet Latch, MD  Other Instructions  Bryn Gulling- Long Term Monitor Instructions  Your physician has requested you wear a ZIO patch monitor for 3 days.  This is a single patch monitor. [employer] supplies one patch monitor per enrollment. Additional stickers are not available. Please do not apply patch if you will be having a Nuclear Stress Test,  Echocardiogram, Cardiac CT, MRI, or Chest Xray during the period you would be wearing the  monitor. The patch cannot be worn during these tests. You cannot remove and re-apply the  ZIO XT patch monitor.  Your ZIO patch monitor will be mailed 3 day USPS to your address on  file. It may take 3-5 days  to receive your monitor after you have been enrolled.  Once you have received your monitor, please review the enclosed instructions. Your monitor  has already been registered assigning a specific monitor serial # to you.  Billing and Patient Assistance Program Information  We have supplied [employer] with any of your insurance information on file for billing purposes. [employer] offers a sliding scale Patient  Assistance Program for patients that do not have  insurance, or whose insurance does not completely cover the cost of the ZIO monitor.  You must apply for the Patient Assistance Program to qualify for this discounted rate.  To apply, please call [employer] at [phone], select option 4, select option 2, ask to apply for  Patient Assistance Program. Theodore Demark will ask your household income, and how many people  are in your household. They will quote your out-of-pocket cost based on that information.  [employer] will also be able to set up a 9-month interest-free payment plan if needed.  Applying the monitor   Shave hair from upper left chest.  Hold abrader disc by orange tab. Rub abrader in 40 strokes over the upper left chest as  indicated in your monitor instructions.  Clean area with 4 enclosed alcohol pads. Let dry.  Apply patch as indicated in monitor instructions. Patch will be placed under collarbone on left  side of chest with arrow pointing upward.  Rub patch adhesive wings for 2 minutes. Remove white label marked "1". Remove the white  label marked "2". Rub patch adhesive wings for 2 additional minutes.  While looking in a mirror, press and release button in center of patch. A small green light will  flash 3-4 times. This will be your only indicator that the monitor has been turned on.  Do not shower for the first 24 hours. You may shower after the first 24 hours.  Press the button if you feel a symptom. You will hear a small click. Record Date, Time and  Symptom in the Patient Logbook.  When you are ready to remove the patch, follow instructions on the last 2 pages of Patient  Logbook. Stick patch monitor onto the last page of Patient Logbook.  Place Patient Logbook in the blue and white box. Use locking tab on box and tape box closed  securely. The blue and white box has prepaid postage on it. Please place it in the mailbox as  soon as possible. Your physician should have your test  results approximately 7 days after the  monitor [facility]  Call [place] [phone] you have questions regarding  your ZIO XT patch monitor. Call them immediately if you see an orange light blinking on your  monitor.  If your monitor falls off in less than 4 days, contact our Monitor department at [phone]  If your monitor becomes loose or falls off after 4 days call [employer] at [phone]  suggestions on securing your monitor   I,Mykaella Javier,acting as a scribe for TSkeet Latch MD.,have documented all relevant documentation on the behalf of TSkeet Latch MD,as directed by  TSkeet Latch MD while in the presence of TSkeet Latch MD.  I, T[place]val Linsey MD have reviewed all documentation for this visit.  The documentation of the exam, diagnosis, procedures, and orders on 11/13/2020 are all accurate and complete.   Signed, TSkeet Latch MD  11/13/2020 5:34 PM    [facility]

## 2020-11-13 NOTE — Assessment & Plan Note (Signed)
Vanessa Allen has been intermittently tachycardic at multiple visits over the last couple years.  She notes that this started after radiation therapy for breast cancer.  She did not receive any chemotherapy.  She notes that when her heart rate starts to get very fast she is a little short of breath.  Thyroid testing and blood counts have been unremarkable.  She does not drink caffeine or alcohol.  She is not anxious.  I suspect this is inappropriate sinus tachycardia.  Her heart rate prior to being treated was in the 70s to 80s.  We did discuss trying beta-blockers, but given that she is minimally symptomatic she is not interested in any long-term medications.  We will get an echocardiogram to assess for structural heart disease.  We will also get a 3-day ZIO to make sure she does not have any other arrhythmias.

## 2020-11-13 NOTE — Assessment & Plan Note (Signed)
Symptoms are very atypical and never with exertion.  She does note that she has been told she has atherosclerosis but lipids are well-controlled on atorvastatin.  No changes for now and no ischemia work-up indicated at this time.

## 2020-11-15 DIAGNOSIS — G4733 Obstructive sleep apnea (adult) (pediatric): Secondary | ICD-10-CM | POA: Diagnosis not present

## 2020-11-17 DIAGNOSIS — H2512 Age-related nuclear cataract, left eye: Secondary | ICD-10-CM | POA: Diagnosis not present

## 2020-11-17 DIAGNOSIS — H2511 Age-related nuclear cataract, right eye: Secondary | ICD-10-CM | POA: Diagnosis not present

## 2020-11-20 DIAGNOSIS — D0511 Intraductal carcinoma in situ of right breast: Secondary | ICD-10-CM | POA: Diagnosis not present

## 2020-11-24 DIAGNOSIS — H2511 Age-related nuclear cataract, right eye: Secondary | ICD-10-CM | POA: Diagnosis not present

## 2020-11-25 ENCOUNTER — Other Ambulatory Visit: Payer: Self-pay | Admitting: Nurse Practitioner

## 2020-11-25 DIAGNOSIS — F419 Anxiety disorder, unspecified: Secondary | ICD-10-CM

## 2020-11-27 ENCOUNTER — Other Ambulatory Visit: Payer: Self-pay

## 2020-11-27 ENCOUNTER — Ambulatory Visit (HOSPITAL_COMMUNITY): Payer: BC Managed Care – PPO | Attending: Internal Medicine

## 2020-11-27 DIAGNOSIS — R0789 Other chest pain: Secondary | ICD-10-CM | POA: Diagnosis not present

## 2020-11-27 DIAGNOSIS — R000 Tachycardia, unspecified: Secondary | ICD-10-CM | POA: Insufficient documentation

## 2020-11-27 LAB — ECHOCARDIOGRAM COMPLETE
Area-P 1/2: 3.54 cm2
S' Lateral: 2 cm

## 2020-12-11 ENCOUNTER — Other Ambulatory Visit: Payer: Self-pay

## 2020-12-11 ENCOUNTER — Ambulatory Visit: Payer: BC Managed Care – PPO | Attending: Surgery

## 2020-12-11 VITALS — Wt 174.4 lb

## 2020-12-11 DIAGNOSIS — Z483 Aftercare following surgery for neoplasm: Secondary | ICD-10-CM | POA: Insufficient documentation

## 2020-12-11 NOTE — Therapy (Signed)
[facility] [address] [place], [place], [address] Phone: [phone]   Fax:  [phone]  Physical Therapy Treatment  Patient Details  Name: Vanessa Allen MRN: [id number] Date of Birth: 06/15/1965 No data recorded  Encounter Date: 12/11/2020   PT End of Session - 12/11/20 1508     Visit Number 2   # unchanged due to screen only   PT Start Time 1506    PT Stop Time 1510    PT Time Calculation (min) 4 min    Activity Tolerance Patient tolerated treatment well    Behavior During Therapy WFL for tasks assessed/performed             Past Medical History:  Diagnosis Date   Arthritis    knees and back    BRCA2 gene mutation positive in female    Cancer (HCC)    Right Breast Cancer   Complication of anesthesia    Diabetes mellitus without complication (HCC)    type 2    Endometriosis    Family history of adverse reaction to anesthesia    brother has issues with n/v after surgery    Family history of BRCA gene mutation    Family history of breast cancer    Family history of leukemia    Family history of ovarian cancer    Family history of stomach cancer    Fibroid uterus    GERD (gastroesophageal reflux disease)    Personal history of radiation therapy    PONV (postoperative nausea and vomiting)    Tachycardia 11/13/2020   Venous insufficiency of lower extremity     Past Surgical History:  Procedure Laterality Date   APPENDECTOMY     BREAST BIOPSY Right 08/25/2019   BREAST LUMPECTOMY Right 09/23/2019   BREAST LUMPECTOMY WITH RADIOACTIVE SEED AND SENTINEL LYMPH NODE BIOPSY Right 09/23/2019   Procedure: RIGHT BREAST LUMPECTOMY WITH RADIOACTIVE SEED AND SENTINEL LYMPH NODE MAPPING;  Surgeon: Cornett, Thomas, MD;  Location: [facility];  Service: General;  Laterality: Right;  PEC BLOCK   CHOLECYSTECTOMY     lsc chole cystectomy and appendectomy 1 month after myomectomy   CYST EXCISION Left 12/13/2019   Procedure:  Excision left dorsal wrist ganglion cyst;  Surgeon: Pace, Collier S, MD;  Location: [facility];  Service: Plastics;  Laterality: Left;  45 min   ENDOVENOUS ABLATION SAPHENOUS VEIN W/ LASER Left 12/30/2019   endovenous laser ablation left greater saphenous vein by Chris Dickson MD    Lipoma removal Left    Left Lower Back   MYOMECTOMY     ROBOTIC ASSISTED SALPINGO OOPHERECTOMY Bilateral 05/23/2020   Procedure: XI ROBOTIC ASSISTED SALPINGO OOPHORECTOMY ;ENTEROLYSIS;  Surgeon: Tucker, Katherine R, MD;  Location: [facility];  Service: Gynecology;  Laterality: Bilateral;   WISDOM TOOTH EXTRACTION      Vitals:   12/11/20 1507  Weight: 174 lb 6 oz (79.1 kg)     Subjective Assessment - 12/11/20 1508     Subjective Pt returns for 3 month L-Dex screen.    Pertinent History Patient was diagnosed on 07/23/2019 with right intermediate grade DCIS with concerns for possible invasive disease. It measures 1.9 cm and is located in the upper inner quadrant. It is ER/PR positive. On 09/23/2019, she had a right lumpectomy with 3 nodes removed.  Radiatation started 9/27 and is schedled til 11/9 she will have no chemo                      L-DEX FLOWSHEETS - 12/11/20 1500       L-DEX LYMPHEDEMA SCREENING   Measurement Type Unilateral    L-DEX MEASUREMENT EXTREMITY Upper Extremity    POSITION  Standing    DOMINANT SIDE Left    At Risk Side Right    BASELINE SCORE (UNILATERAL) 3.3    L-DEX SCORE (UNILATERAL) 2.9    VALUE CHANGE (UNILAT) -0.4                                     PT Long Term Goals - 11/01/19 1236       PT LONG TERM GOAL #1   Title Patient will demonstrate she has regained full shoulder ROM and function post operatively compared to baselines.    Baseline pt reports she has mild pain in right shoudler    Time 8    Period Weeks    Status Partially Met                   Plan - 12/11/20 1509     Clinical Impression Statement Pt  returns for her 3 month L-Dex screen. Her change from bsaeline of -0.4 is WNLs so no further treatment is required at this time except to cont every 3 month L-Dex screens which pt is agreeable to.    PT Next Visit Plan Cont every 3 month L-Dex screens for up to 2 years from her SLNB. (09/22/21)    Consulted and Agree with Plan of Care Patient             Patient will benefit from skilled therapeutic intervention in order to improve the following deficits and impairments:     Visit Diagnosis: Aftercare following surgery for neoplasm     Problem List Patient Active Problem List   Diagnosis Date Noted   Tachycardia 11/13/2020   Endometriosis    PVD (peripheral vascular disease) (HCC) 02/28/2020   Breast cancer, BRCA2 positive, right (HCC) 12/08/2019   Central sleep apnea 09/20/2019   Genetic testing 09/10/2019   BRCA2 gene mutation positive in female 09/10/2019   Family history of BRCA gene mutation    Family history of breast cancer    Family history of ovarian cancer    Family history of stomach cancer    Family history of leukemia    Ductal carcinoma in situ (DCIS) of right breast 08/31/2019   Chronic insomnia 06/08/2019   Atypical chest pain 07/20/2018   Chest discomfort 05/21/2018   Anxiety 05/21/2018   Snoring 10/27/2017   Episodic circadian rhythm sleep disorder, shift work type 10/27/2017   Insomnia 10/27/2017   Type 2 diabetes mellitus without complication, without long-term current use of insulin (HCC) 10/27/2017    Rosenberger, Valerie Ann, PTA 12/11/2020, 3:11 PM  [facility] [address] [place], [place], [address] Phone: [phone]   Fax:  [phone]  Name: Aidyn Raymond MRN: [id number] Date of Birth: 01/16/1966    

## 2020-12-12 ENCOUNTER — Other Ambulatory Visit: Payer: Self-pay

## 2020-12-12 DIAGNOSIS — G4700 Insomnia, unspecified: Secondary | ICD-10-CM

## 2020-12-12 MED ORDER — BELSOMRA 10 MG PO TABS: 1.0000 | ORAL_TABLET | Freq: Every evening | ORAL | 2 refills | Status: DC | PRN

## 2020-12-12 MED ORDER — BELSOMRA 10 MG PO TABS: 1.0000 | ORAL_TABLET | Freq: Every evening | ORAL | 1 refills | Status: DC | PRN

## 2020-12-12 NOTE — Addendum Note (Signed)
Addended by: Georgiann Cocker on: 12/12/2020 10:03 AM   Modules accepted: Orders

## 2020-12-12 NOTE — Addendum Note (Signed)
Addended byUbaldo Glassing, [place] L on: 12/12/2020 10:06 AM   Modules accepted: Orders

## 2020-12-12 NOTE — Addendum Note (Signed)
Addended byUbaldo Glassing, Kieren Ricci L on: 12/12/2020 10:07 AM   Modules accepted: Orders

## 2020-12-15 DIAGNOSIS — H2512 Age-related nuclear cataract, left eye: Secondary | ICD-10-CM | POA: Diagnosis not present

## 2020-12-16 DIAGNOSIS — G4733 Obstructive sleep apnea (adult) (pediatric): Secondary | ICD-10-CM | POA: Diagnosis not present

## 2020-12-19 ENCOUNTER — Encounter (HOSPITAL_BASED_OUTPATIENT_CLINIC_OR_DEPARTMENT_OTHER): Payer: Self-pay | Admitting: Cardiovascular Disease

## 2020-12-25 DIAGNOSIS — H2512 Age-related nuclear cataract, left eye: Secondary | ICD-10-CM | POA: Diagnosis not present

## 2020-12-28 ENCOUNTER — Inpatient Hospital Stay (HOSPITAL_BASED_OUTPATIENT_CLINIC_OR_DEPARTMENT_OTHER): Payer: BC Managed Care – PPO | Admitting: Oncology

## 2020-12-28 ENCOUNTER — Other Ambulatory Visit: Payer: Self-pay | Admitting: *Deleted

## 2020-12-28 ENCOUNTER — Other Ambulatory Visit: Payer: Self-pay

## 2020-12-28 ENCOUNTER — Inpatient Hospital Stay: Payer: BC Managed Care – PPO | Attending: Oncology

## 2020-12-28 VITALS — BP 136/92 | HR 109 | Temp 97.7°F | Resp 18 | Wt 175.9 lb

## 2020-12-28 DIAGNOSIS — N951 Menopausal and female climacteric states: Secondary | ICD-10-CM | POA: Insufficient documentation

## 2020-12-28 DIAGNOSIS — Z923 Personal history of irradiation: Secondary | ICD-10-CM | POA: Insufficient documentation

## 2020-12-28 DIAGNOSIS — C50911 Malignant neoplasm of unspecified site of right female breast: Secondary | ICD-10-CM | POA: Diagnosis not present

## 2020-12-28 DIAGNOSIS — Z1501 Genetic susceptibility to malignant neoplasm of breast: Secondary | ICD-10-CM

## 2020-12-28 DIAGNOSIS — Z1502 Genetic susceptibility to malignant neoplasm of ovary: Secondary | ICD-10-CM

## 2020-12-28 DIAGNOSIS — Z1509 Genetic susceptibility to other malignant neoplasm: Secondary | ICD-10-CM

## 2020-12-28 DIAGNOSIS — D0511 Intraductal carcinoma in situ of right breast: Secondary | ICD-10-CM | POA: Diagnosis not present

## 2020-12-28 LAB — CBC WITH DIFFERENTIAL (CANCER CENTER ONLY)
Abs Immature Granulocytes: 0.01 10*3/uL (ref 0.00–0.07)
Basophils Absolute: 0 10*3/uL (ref 0.0–0.1)
Basophils Relative: 0 %
Eosinophils Absolute: 0 10*3/uL (ref 0.0–0.5)
Eosinophils Relative: 1 %
HCT: 36.8 % (ref 36.0–46.0)
Hemoglobin: 12.8 g/dL (ref 12.0–15.0)
Immature Granulocytes: 0 %
Lymphocytes Relative: 40 %
Lymphs Abs: 1.5 10*3/uL (ref 0.7–4.0)
MCH: 27.8 pg (ref 26.0–34.0)
MCHC: 34.8 g/dL (ref 30.0–36.0)
MCV: 80 fL (ref 80.0–100.0)
Monocytes Absolute: 0.2 10*3/uL (ref 0.1–1.0)
Monocytes Relative: 6 %
Neutro Abs: 2 10*3/uL (ref 1.7–7.7)
Neutrophils Relative %: 53 %
Platelet Count: 256 10*3/uL (ref 150–400)
RBC: 4.6 MIL/uL (ref 3.87–5.11)
RDW: 12.9 % (ref 11.5–15.5)
WBC Count: 3.8 10*3/uL — ABNORMAL LOW (ref 4.0–10.5)
nRBC: 0 % (ref 0.0–0.2)

## 2020-12-28 LAB — CMP (CANCER CENTER ONLY)
ALT: 21 U/L (ref 0–44)
AST: 14 U/L — ABNORMAL LOW (ref 15–41)
Albumin: 4 g/dL (ref 3.5–5.0)
Alkaline Phosphatase: 62 U/L (ref 38–126)
Anion gap: 11 (ref 5–15)
BUN: 12 mg/dL (ref 6–20)
CO2: 21 mmol/L — ABNORMAL LOW (ref 22–32)
Calcium: 9 mg/dL (ref 8.9–10.3)
Chloride: 110 mmol/L (ref 98–111)
Creatinine: 0.86 mg/dL (ref 0.44–1.00)
GFR, Estimated: >60 mL/min (ref >60)
Glucose, Bld: 127 mg/dL — ABNORMAL HIGH (ref 70–99)
Potassium: 3.6 mmol/L (ref 3.5–5.1)
Sodium: 142 mmol/L (ref 135–145)
Total Bilirubin: 0.6 mg/dL (ref 0.3–1.2)
Total Protein: 7 g/dL (ref 6.5–8.1)

## 2020-12-28 MED ORDER — VENLAFAXINE HCL ER 75 MG PO CP24: 75.0000 mg | ORAL_CAPSULE | Freq: Every day | ORAL | 4 refills | Status: DC

## 2020-12-28 NOTE — Progress Notes (Signed)
[facility]  [phone] [phone]     ID: [name] DOB: Sep 09, 1965  MR#: [id number]  KMM#:[id number]  Patient Care Team: [name], Grayson as PCP - General (General Practice) Erroll Luna, MD as Consulting Physician (General Surgery) Tilley Faeth, Virgie Dad, MD as Consulting Physician (Oncology) Kyung Rudd, MD as Consulting Physician (Radiation Oncology) Mauro Kaufmann, RN as Oncology Nurse Navigator Rockwell Germany, RN as Oncology Nurse Navigator Lafonda Mosses, MD as Consulting Physician (Gynecologic Oncology) Chauncey Cruel, MD OTHER MD:  CHIEF COMPLAINT: Ductal carcinoma in situ; BRCA2+  CURRENT TREATMENT: tamoxifen   INTERVAL HISTORY: Aeon returns today for follow up of her noninvasive breast cancer.  She started tamoxifen on 12/08/2019.  She is generally tolerating this well.  She obtains it at a good price.  Since her last visit, she underwent breast MRI on 08/30/2020 showing: breast density category D; indeterminate 5 mm enhancing focus in upper-outer right breast; no evidence of malignancy in left breast.   She proceeded to biopsy of the right breast area in question on 09/13/2020. Pathology [phone]) showed fibrocystic change with hyaline fibrosis.  Of note, she met back with Dr. Brantley Stage on 11/20/2020 to discuss bilateral mastectomy for risk reduction.  She also underwent CT abdomen/pelvis on 07/05/2020 for abdominal pain with rectal bleeding and intermittent nausea. This showed no acute finding.   This prompted colonoscopy on 07/24/2020 under Dr. Collene Mares. The examination was normal.   REVIEW OF SYSTEMS: Mariena is tolerating tamoxifen generally well except for the hot flashes.  They do occasionally wake her up but the more problematic issue for her with sleep is stress from work.  When she is not working she sleeps fine she says.  At work she gets more than 10,000 steps a day just as part of her activities.  She  does not otherwise exercise.  Aside from that a detailed review of systems was stable.   COVID 19 VACCINATION STATUS: [place] x2, status post booster   HISTORY OF CURRENT ILLNESS: From the original intake note:  Chinaza E Cullens had routine screening mammography on 07/23/2019 showing a possible abnormality in the right breast. She underwent right diagnostic mammography with tomography at [facility] on 08/19/2019 showing: breast density category C; indeterminate 1.9 cm medial right breast calcifications.  Accordingly on 08/25/2019 she proceeded to biopsy of the right breast area in question. The pathology from this procedure ([id number]) showed: ductal carcinoma involving a papillary lesion. There is at least intermediate grade ductal carcinoma in situ, however, the lesion is fragmented hampering assessment and invasion can not be excluded. Lymph node sampling at the time of excision is recommended. Prognostic indicators significant for: estrogen receptor, 90% positive and progesterone receptor, 90% positive, both with strong staining intensity.  Right axilla ultrasound was performed on 08/31/2019 showing no abnormal-appearing lymph nodes.   The patient's subsequent history is as detailed below.   PAST MEDICAL HISTORY: Past Medical History:  Diagnosis Date   Arthritis    knees and back    BRCA2 gene mutation positive in female    Cancer [facility])    Right Breast Cancer   Complication of anesthesia    Diabetes mellitus without complication ([place])    type 2    Endometriosis    Family history of adverse reaction to anesthesia    brother has issues with n/v after surgery    Family history of BRCA gene mutation    Family history of breast cancer    Family  history of leukemia    Family history of ovarian cancer    Family history of stomach cancer    Fibroid uterus    GERD (gastroesophageal reflux disease)    Personal history of radiation therapy    PONV (postoperative nausea and  vomiting)    Tachycardia 11/13/2020   Venous insufficiency of lower extremity     PAST SURGICAL HISTORY: Past Surgical History:  Procedure Laterality Date   APPENDECTOMY     BREAST BIOPSY Right 08/25/2019   BREAST LUMPECTOMY Right 09/23/2019   BREAST LUMPECTOMY WITH RADIOACTIVE SEED AND SENTINEL LYMPH NODE BIOPSY Right 09/23/2019   Procedure: RIGHT BREAST LUMPECTOMY WITH RADIOACTIVE SEED AND SENTINEL LYMPH NODE MAPPING;  Surgeon: Erroll Luna, MD;  Location: [facility];  Service: General;  Laterality: Right;  PEC BLOCK   CHOLECYSTECTOMY     lsc chole cystectomy and appendectomy 1 month after myomectomy   CYST EXCISION Left 12/13/2019   Procedure: Excision left dorsal wrist ganglion cyst;  Surgeon: Cindra Presume, MD;  Location: [facility];  Service: Plastics;  Laterality: Left;  45 min   ENDOVENOUS ABLATION SAPHENOUS VEIN W/ LASER Left 12/30/2019   endovenous laser ablation left greater saphenous vein by Gae Gallop MD    Lipoma removal Left    Left Lower Back   MYOMECTOMY     ROBOTIC ASSISTED SALPINGO OOPHERECTOMY Bilateral 05/23/2020   Procedure: XI ROBOTIC ASSISTED SALPINGO OOPHORECTOMY ;ENTEROLYSIS;  Surgeon: Lafonda Mosses, MD;  Location[facility];  Service: Gynecology;  Laterality: Bilateral;   WISDOM TOOTH EXTRACTION      FAMILY HISTORY: Family History  Problem Relation Age of Onset   Heart failure Mother    Hyperlipidemia Mother    Hypertension Mother    Varicose Veins Mother    Diabetes Father    Heart disease Father    Stomach cancer Father 85   CAD Father    Hypertension Brother    Leukemia Paternal Aunt        dx. >50   Ovarian cancer Cousin        d. <50 (maternal first cousin)   Ovarian cancer Cousin        d. <50 (maternal first cousin)   Ovarian cancer Cousin        d. late 65s (maternal first cousin)   Breast cancer Niece 38   BRCA 1/2 Niece    Colon cancer Neg Hx    Pancreatic cancer Neg Hx    Prostate cancer Neg Hx    Her father  died at age 90 after being diagnosed with stomach cancer. Her mother is age 28 (as of 08/2019). Maite has 3 brothers and 2 sisters. She reports ovarian cancer in two maternal aunts and breast cancer in a niece (brother's daughter) at age 72.   GYNECOLOGIC HISTORY:  No LMP recorded. Patient is postmenopausal. Menarche: 54 years old GX P 0 LMP age [age] (early menopause) Contraceptive never used HRT used for <1 year  Hysterectomy? No, only myomectomy BSO? 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 She is single. She lives at home by herelf.    ADVANCED DIRECTIVES: not in place. She intends to name one of her sisters, Clent Demark or Insurance account manager as healthcare power of attorney.   HEALTH MAINTENANCE: Social History   Tobacco Use   Smoking status: Never   Smokeless tobacco: Never  Vaping Use   Vaping Use: Never used  Substance Use Topics   Alcohol use: Yes    Alcohol/week: 0.0 standard drinks    Comment: rare    Drug use: Never     Colonoscopy: 11/2017, repeat due 2029  PAP: 06/2018, negative  Bone density: never done   No Known Allergies  Current Outpatient Medications  Medication Sig Dispense Refill   ACCU-CHEK GUIDE test strip USE TWICE DAILY TO CHECK BLOOD SUGAR BEFORE BREAKFAST AND DINNER 100 strip 5   acidophilus (RISAQUAD) CAPS capsule Take 1 capsule by mouth daily.     ALPRAZolam (XANAX) 0.5 MG tablet TAKE 1 TABLET(0.5 MG) BY MOUTH THREE TIMES DAILY AS NEEDED FOR ANXIETY 30 tablet 2   amitriptyline (ELAVIL) 25 MG tablet Take 1 tablet (25 mg total) by mouth at bedtime. 90 tablet 3   aspirin EC 81 MG tablet Take 81 mg by mouth daily. Swallow whole.     atorvastatin (LIPITOR) 10 MG tablet TAKE 1 TABLET(10 MG) BY MOUTH EVERY 7 DAYS 12 tablet 0   Biotin w/ Vitamins C & E (HAIR/SKIN/NAILS PO) Take 1 tablet by mouth daily.     Coenzyme Q10 (CO Q-10) 100 MG CAPS Take by mouth.     Continuous  Blood Gluc Sensor (FREESTYLE LIBRE 14 DAY SENSOR) MISC USE TO MEASURE BLOOD GLUCOSE FOUR TIMES DAILY BEFORE MEALS AND AT BEDTIME 2 each 11   esomeprazole (NEXIUM) 20 MG capsule TAKE 1 CAPSULE(20 MG) BY MOUTH DAILY 90 capsule 1   gabapentin (NEURONTIN) 300 MG capsule Take 1 capsule (300 mg total) by mouth at bedtime. 90 capsule 4   Glucose Blood (GLUCOSE METER TEST VI) by In Vitro route 2 (two) times daily.     metFORMIN (GLUCOPHAGE) 500 MG tablet TAKE 1 TABLET(500 MG) BY MOUTH EVERY DAY WITH BREAKFAST 90 tablet 1   Multiple Vitamin (MULTIVITAMIN WITH MINERALS) TABS tablet Take 1 tablet by mouth daily.     Omega-3 1000 MG CAPS Take 1,000 mg by mouth daily.     RYBELSUS 14 MG TABS TAKE 1 TABLET BY MOUTH DAILY 30 MINUTES BEFORE BREAKFAST 90 tablet 0   Suvorexant (BELSOMRA) 10 MG TABS Take 1 tablet by mouth at bedtime as needed. 90 tablet 1   tamoxifen (NOLVADEX) 20 MG tablet Take 20 mg by mouth daily.     Turmeric (QC TUMERIC COMPLEX PO) Take 1,000 mg by mouth daily.     No current facility-administered medications for this visit.    OBJECTIVE: African-American woman who appears younger than stated age  32:   12/28/20 1356  BP: (!) 136/92  Pulse: (!) 109  Resp: 18  Temp: 97.7 F (36.5 C)  SpO2: 97%      Body mass index is 28.39 kg/m.   Wt Readings from Last 3 Encounters:  12/28/20 175 lb 14.4 oz (79.8 kg)  12/11/20 174 lb 6 oz (79.1 kg)  11/13/20 173 lb 1.6 oz (78.5 kg)     ECOG FS:2 - Symptomatic, <50% confined to bed  Sclerae unicteric, EOMs intact Wearing a mask No cervical or supraclavicular adenopathy Lungs no rales or rhonchi Heart regular rate and rhythm Abd soft, nontender, positive bowel sounds MSK no focal spinal tenderness, no upper extremity lymphedema Neuro: nonfocal, well oriented, appropriate affect Breasts: The right breast has undergone lumpectomy followed by radiation.  There is no evidence of disease recurrence.  The left breast and both axilla are  benign.  LAB RESULTS:  CMP     Component Value Date/Time   NA 140 07/05/2020 0134  NA 142 02/28/2020 1049   K 3.6 07/05/2020 0134   CL 106 07/05/2020 0134   CO2 25 07/05/2020 0134   GLUCOSE 127 (H) 07/05/2020 0134   BUN 15 07/05/2020 0134   BUN 15 02/28/2020 1049   CREATININE 0.87 07/05/2020 0134   CREATININE 0.89 06/27/2020 1240   CALCIUM 9.4 07/05/2020 0134   PROT 7.5 07/05/2020 0134   PROT 7.2 07/26/2019 0931   ALBUMIN 4.2 07/05/2020 0134   ALBUMIN 4.5 07/26/2019 0931   AST 27 07/05/2020 0134   AST 19 06/27/2020 1240   ALT 42 07/05/2020 0134   ALT 25 06/27/2020 1240   ALKPHOS 63 07/05/2020 0134   BILITOT 0.3 07/05/2020 0134   BILITOT 0.4 06/27/2020 1240   GFRNONAA >60 07/05/2020 0134   GFRNONAA >60 06/27/2020 1240   GFRAA 75 02/28/2020 1049   GFRAA >60 09/01/2019 1210    No results found for: TOTALPROTELP, ALBUMINELP, A1GS, A2GS, BETS, BETA2SER, GAMS, MSPIKE, SPEI  Lab Results  Component Value Date   WBC 3.8 (L) 12/28/2020   NEUTROABS 2.0 12/28/2020   HGB 12.8 12/28/2020   HCT 36.8 12/28/2020   MCV 80.0 12/28/2020   PLT 256 12/28/2020    No results found for: LABCA2  No components found for: JJOACZ660  No results for input(s): INR in the last 168 hours.  No results found for: LABCA2  No results found for: CAN199  Lab Results  Component Value Date   CAN125 35.5 02/07/2020    No results found for: YTK160  No results found for: CA2729  No components found for: HGQUANT  No results found for: CEA1 / No results found for: CEA1   No results found for: AFPTUMOR  No results found for: CHROMOGRNA  No results found for: KPAFRELGTCHN, LAMBDASER, KAPLAMBRATIO (kappa/lambda light chains)  No results found for: HGBA, HGBA2QUANT, HGBFQUANT, HGBSQUAN (Hemoglobinopathy evaluation)   No results found for: LDH  No results found for: IRON, TIBC, IRONPCTSAT (Iron and TIBC)  No results found for: FERRITIN  Urinalysis    Component Value Date/Time    COLORURINE YELLOW 04/21/2020 1318   APPEARANCEUR CLEAR 04/21/2020 1318   LABSPEC 1.019 04/21/2020 1318   PHURINE 6.0 04/21/2020 1318   GLUCOSEU NEGATIVE 04/21/2020 1318   HGBUR NEGATIVE 04/21/2020 1318   BILIRUBINUR NEGATIVE 04/21/2020 1318   BILIRUBINUR negative 07/20/2018 1108   KETONESUR NEGATIVE 04/21/2020 1318   PROTEINUR NEGATIVE 04/21/2020 1318   UROBILINOGEN 0.2 07/20/2018 1108   NITRITE NEGATIVE 04/21/2020 1318   LEUKOCYTESUR SMALL (A) 04/21/2020 1318    STUDIES: No results found.   ELIGIBLE FOR AVAILABLE RESEARCH PROTOCOL: AET  ASSESSMENT: 55 y.o. [place] woman status post right breast biopsy 08/25/2019 for ductal carcinoma in situ, grade 2 or 3, estrogen and progesterone receptor positive  (1) genetics testing 09/09/2019 found a heterozygous pathogenic variant  in the BRCA2 gene called c.[id number] (p.Ser1982Argfs*[id number])  (a) no additional deleterious mutations were noted through the [facility] Breast Cancer STAT panel in ATM, BRCA1, BRCA2, CDH1, CHEK2, PALB2, PTEN, STK11 and TP53.  (2) right lumpectomy and sentinel lymph node sampling 09/23/2019 showed ductal carcinoma in situ, largest section 0.5 cm, with negative margins: pTis pN0, stage 0  (a) a total of 2 axillary lymph nodes were removed  (3) adjuvant radiation completed 12/08/2019 (50.4 Gray to the right breast plus boost)  (4) tamoxifen started 12/08/2019  (5) bilateral salpingo-oophorectomy pendingS/p bilateral salpingo-oophorectomy 05/23/2020 with benign pathoogy  (6) intensified screening with mammography in February and breast MRI in August.   PLAN: Ketzia  is now a little over a year out from definitive surgery for her breast cancer with no evidence of disease recurrence.  This is favorable.  She is tolerating tamoxifen well quite aside from the issue of hot flashes.  The plan is to continue that a minimum of 5 years.  I think she will benefit from venlafaxine.  We will try 75 mg.  She was cautioned  not to suddenly discontinue this medication but call us if she wishes to come off it so we can give her an appropriate taper.  Otherwise she will have mammography in February and a breast MRI in August.  She will see [place] after the August MRI.  She knows to call for any issues that may develop before then  Total encounter time 25 minutes.Sarajane Jews C. Blaize Nipper, MD 12/28/2020 2:21 PM Medical Oncology and Hematology [facility] [place], [place] [address] Tel. [phone]    Fax. [phone]   This document serves as a record of services personally performed by Lurline Del, MD. It was created on his behalf by Wilburn Mylar, a trained medical scribe. The creation of this record is based on the scribe's personal observations and the provider's statements to them.   I, Lurline Del MD, have reviewed the above documentation for accuracy and completeness, and I agree with the above.   *Total Encounter Time as defined by the Centers for Medicare and Medicaid Services includes, in addition to the face-to-face time of a patient visit (documented in the note above) non-face-to-face time: obtaining and reviewing outside history, ordering and reviewing medications, tests or procedures, care coordination (communications with other health care professionals or caregivers) and documentation in the medical record.

## 2020-12-31 ENCOUNTER — Other Ambulatory Visit: Payer: Self-pay | Admitting: Nurse Practitioner

## 2020-12-31 DIAGNOSIS — E119 Type 2 diabetes mellitus without complications: Secondary | ICD-10-CM

## 2021-01-15 ENCOUNTER — Other Ambulatory Visit: Payer: Self-pay | Admitting: Nurse Practitioner

## 2021-01-15 DIAGNOSIS — E119 Type 2 diabetes mellitus without complications: Secondary | ICD-10-CM

## 2021-01-25 ENCOUNTER — Other Ambulatory Visit: Payer: Self-pay | Admitting: Nurse Practitioner

## 2021-01-25 DIAGNOSIS — E119 Type 2 diabetes mellitus without complications: Secondary | ICD-10-CM

## 2021-01-28 ENCOUNTER — Other Ambulatory Visit: Payer: Self-pay | Admitting: Oncology

## 2021-01-29 DIAGNOSIS — Z20822 Contact with and (suspected) exposure to covid-19: Secondary | ICD-10-CM | POA: Diagnosis not present

## 2021-01-29 NOTE — Telephone Encounter (Signed)
Per 12/22 note, continue for 5 years. Gardiner Rhyme, RN

## 2021-02-16 ENCOUNTER — Ambulatory Visit (HOSPITAL_BASED_OUTPATIENT_CLINIC_OR_DEPARTMENT_OTHER): Payer: BC Managed Care – PPO | Admitting: Cardiovascular Disease

## 2021-02-22 ENCOUNTER — Encounter (HOSPITAL_COMMUNITY): Payer: Self-pay

## 2021-03-12 ENCOUNTER — Ambulatory Visit: Payer: BC Managed Care – PPO

## 2021-03-13 ENCOUNTER — Other Ambulatory Visit: Payer: Self-pay | Admitting: Nurse Practitioner

## 2021-03-15 ENCOUNTER — Ambulatory Visit: Payer: BC Managed Care – PPO

## 2021-03-15 LAB — HM DIABETES EYE EXAM

## 2021-03-15 MED ORDER — RYBELSUS 14 MG PO TABS: 1.0000 | ORAL_TABLET | Freq: Every day | ORAL | 0 refills | Status: DC

## 2021-03-16 ENCOUNTER — Other Ambulatory Visit: Payer: Self-pay | Admitting: *Deleted

## 2021-03-16 ENCOUNTER — Ambulatory Visit: Payer: BC Managed Care – PPO | Attending: Surgery | Admitting: Rehabilitation

## 2021-03-16 ENCOUNTER — Other Ambulatory Visit: Payer: Self-pay | Admitting: Hematology and Oncology

## 2021-03-16 ENCOUNTER — Other Ambulatory Visit: Payer: Self-pay

## 2021-03-16 DIAGNOSIS — D0511 Intraductal carcinoma in situ of right breast: Secondary | ICD-10-CM

## 2021-03-16 DIAGNOSIS — Z483 Aftercare following surgery for neoplasm: Secondary | ICD-10-CM | POA: Insufficient documentation

## 2021-03-16 DIAGNOSIS — Z1501 Genetic susceptibility to malignant neoplasm of breast: Secondary | ICD-10-CM

## 2021-03-16 DIAGNOSIS — C50911 Malignant neoplasm of unspecified site of right female breast: Secondary | ICD-10-CM

## 2021-03-16 NOTE — Therapy (Addendum)
 [facility] @ [facility] [address] [place], [place], [address] Phone: [phone]   Fax:  [phone]  Physical Therapy Treatment  Patient Details  Name: Vanessa Allen MRN: [id number] Date of Birth: 07-28-1965 No data recorded  Encounter Date: 03/16/2021   PT End of Session - 03/16/21 1152     Visit Number 2   no change due to screen   Number of Visits 2    Activity Tolerance Patient tolerated treatment well             Past Medical History:  Diagnosis Date   Arthritis    knees and back    BRCA2 gene mutation positive in female    Cancer [facility])    Right Breast Cancer   Complication of anesthesia    Diabetes mellitus without complication (HCC)    type 2    Endometriosis    Family history of adverse reaction to anesthesia    brother has issues with n/v after surgery    Family history of BRCA gene mutation    Family history of breast cancer    Family history of leukemia    Family history of ovarian cancer    Family history of stomach cancer    Fibroid uterus    GERD (gastroesophageal reflux disease)    Personal history of radiation therapy    PONV (postoperative nausea and vomiting)    Tachycardia 11/13/2020   Venous insufficiency of lower extremity     Past Surgical History:  Procedure Laterality Date   APPENDECTOMY     BREAST BIOPSY Right 08/25/2019   BREAST LUMPECTOMY Right 09/23/2019   BREAST LUMPECTOMY WITH RADIOACTIVE SEED AND SENTINEL LYMPH NODE BIOPSY Right 09/23/2019   Procedure: RIGHT BREAST LUMPECTOMY WITH RADIOACTIVE SEED AND SENTINEL LYMPH NODE MAPPING;  Surgeon: Harriette Bouillon, MD;  Location: [facility];  Service: General;  Laterality: Right;  PEC BLOCK   CHOLECYSTECTOMY     lsc chole cystectomy and appendectomy 1 month after myomectomy   CYST EXCISION Left 12/13/2019   Procedure: Excision left dorsal wrist ganglion cyst;  Surgeon: Allena Napoleon, MD;  Location: [facility];  Service:  Plastics;  Laterality: Left;  45 min   ENDOVENOUS ABLATION SAPHENOUS VEIN W/ LASER Left 12/30/2019   endovenous laser ablation left greater saphenous vein by Cari Caraway MD    Lipoma removal Left    Left Lower Back   MYOMECTOMY     ROBOTIC ASSISTED SALPINGO OOPHERECTOMY Bilateral 05/23/2020   Procedure: XI ROBOTIC ASSISTED SALPINGO OOPHORECTOMY ;ENTEROLYSIS;  Surgeon: Carver Fila, MD;  Location: [facility];  Service: Gynecology;  Laterality: Bilateral;   WISDOM TOOTH EXTRACTION      There were no vitals filed for this visit.   Subjective Assessment - 03/16/21 1153     Subjective Doing well. No swelling    Pertinent History Sx: 09/23/19                    L-DEX FLOWSHEETS - 03/16/21 1100       L-DEX LYMPHEDEMA SCREENING   Measurement Type Unilateral    L-DEX MEASUREMENT EXTREMITY Upper Extremity    POSITION  Standing    DOMINANT SIDE Right    At Risk Side Right    BASELINE SCORE (UNILATERAL) 3.3    L-DEX SCORE (UNILATERAL) 2.9    VALUE CHANGE (UNILAT) -0.4    Comment 18 months out of surgery  PHYSICAL THERAPY DISCHARGE SUMMARY  Visits from Start of Care: 2 visits and 5 screens  No return after last screen                            Plan - 03/16/21 1156     Clinical Impression Statement SOZO score WNL no signs of subclinical lymphedema.  One more 3 month screen remaining.    PT Next Visit Plan SOZO for 21 months post surgery    Consulted and Agree with Plan of Care Patient             Patient will benefit from skilled therapeutic intervention in order to improve the following deficits and impairments:     Visit Diagnosis: Aftercare following surgery for neoplasm     Problem List Patient Active Problem List   Diagnosis Date Noted   Tachycardia 11/13/2020   Endometriosis    PVD (peripheral vascular disease) (HCC) 02/28/2020   Breast cancer, BRCA2 positive, right (HCC) 12/08/2019   Central  sleep apnea 09/20/2019   Genetic testing 09/10/2019   BRCA2 gene mutation positive in female 09/10/2019   Family history of BRCA gene mutation    Family history of breast cancer    Family history of ovarian cancer    Family history of stomach cancer    Family history of leukemia    Ductal carcinoma in situ (DCIS) of right breast 08/31/2019   Chronic insomnia 06/08/2019   Atypical chest pain 07/20/2018   Chest discomfort 05/21/2018   Anxiety 05/21/2018   Snoring 10/27/2017   Episodic circadian rhythm sleep disorder, shift work type 10/27/2017   Insomnia 10/27/2017   Type 2 diabetes mellitus without complication, without long-term current use of insulin (HCC) 10/27/2017    Idamae Lusher, PT 03/16/2021, 11:57 AM  [facility] @ [facility] [address] [place], [place], [address] Phone: [phone]   Fax:  [phone]  Name: Cathryne Mancebo MRN: [id number] Date of Birth: May 03, 1965

## 2021-03-17 ENCOUNTER — Ambulatory Visit
Admission: RE | Admit: 2021-03-17 | Discharge: 2021-03-17 | Disposition: A | Discharge status: Home / Self Care | Payer: BC Managed Care – PPO | Source: Ambulatory Visit | Attending: Hematology and Oncology | Admitting: Hematology and Oncology

## 2021-03-17 DIAGNOSIS — R922 Inconclusive mammogram: Secondary | ICD-10-CM | POA: Diagnosis not present

## 2021-03-17 DIAGNOSIS — D0511 Intraductal carcinoma in situ of right breast: Secondary | ICD-10-CM

## 2021-03-17 DIAGNOSIS — C50911 Malignant neoplasm of unspecified site of right female breast: Secondary | ICD-10-CM

## 2021-03-17 IMAGING — MG DIGITAL DIAGNOSTIC BILAT W/ TOMO W/ CAD
6 of 9 series · 6 of 25 positions shown · non-contrast
Comparison: Previous exam(s).

CLINICAL DATA: Right lumpectomy.  Annual mammography.

EXAM:
DIGITAL DIAGNOSTIC BILATERAL MAMMOGRAM WITH TOMOSYNTHESIS AND CAD
TECHNIQUE: Bilateral digital diagnostic mammography and breast tomosynthesis
was performed. The images were evaluated with computer-aided
detection.

[R CC]
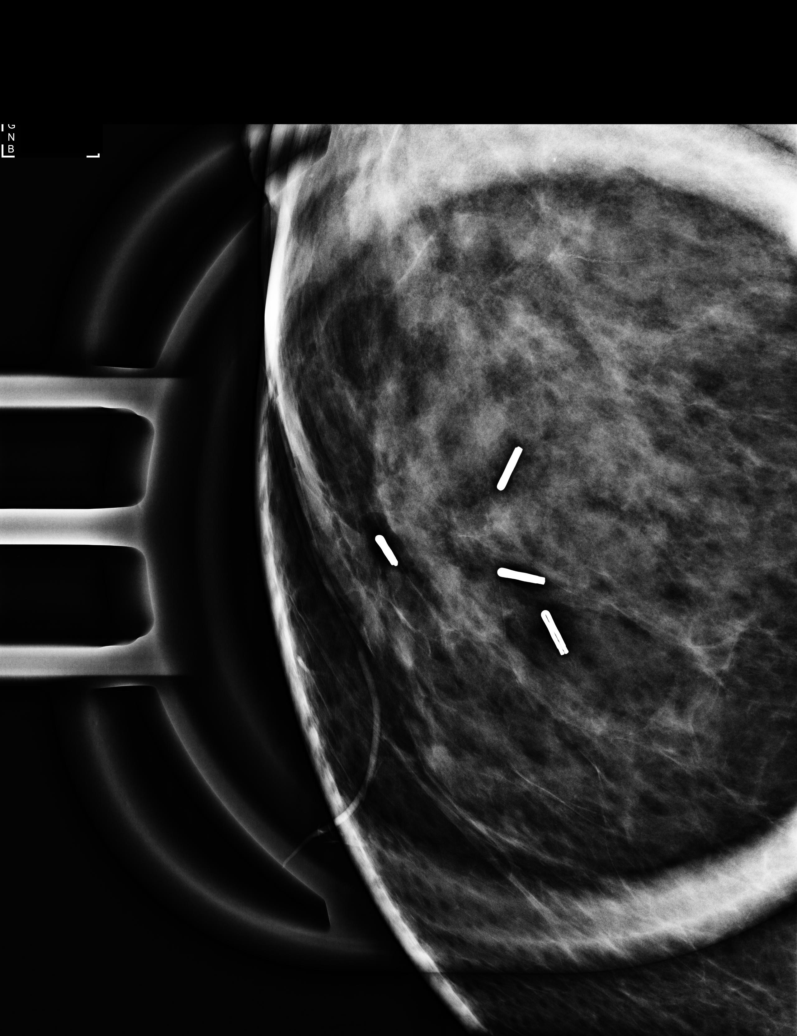

[L CC synth-2D]
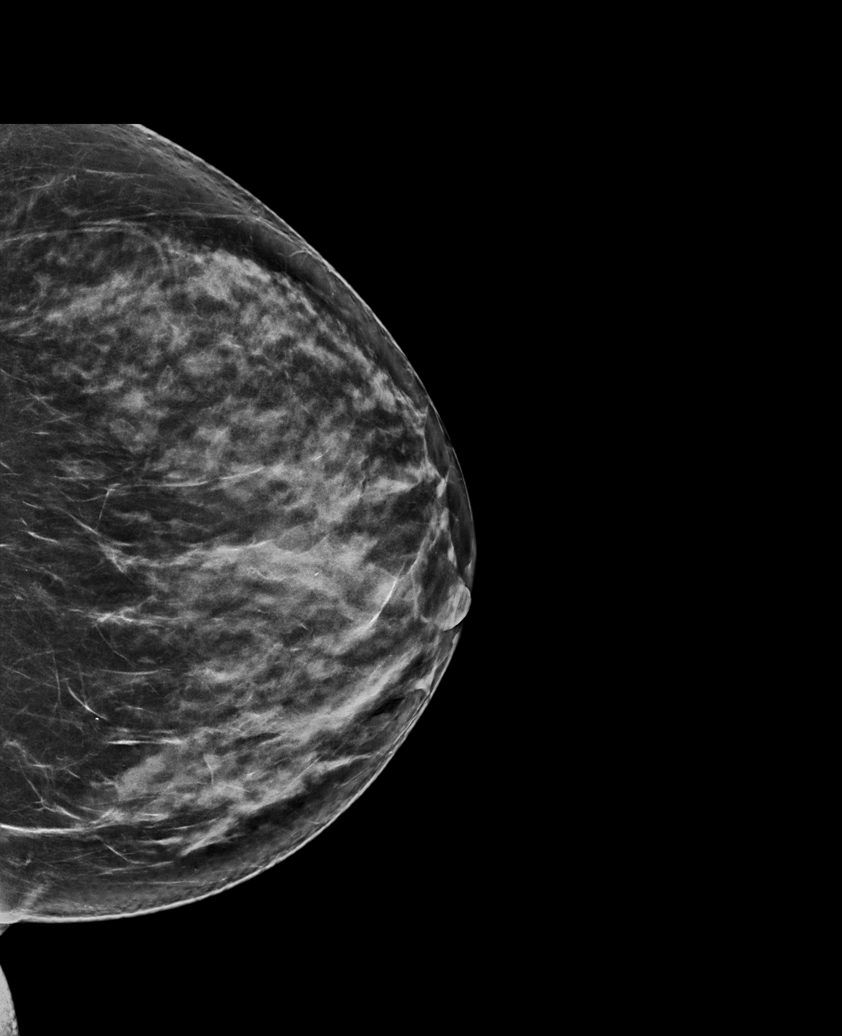

[R CC synth-2D]
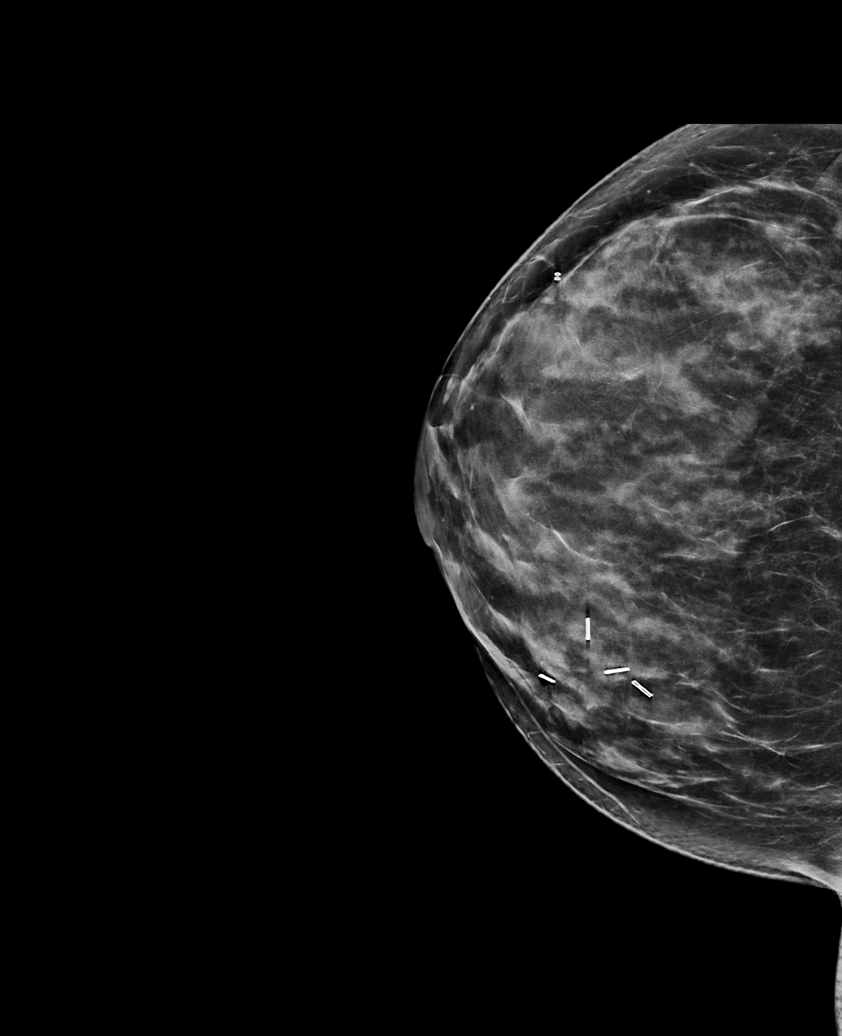

[L MLO synth-2D]
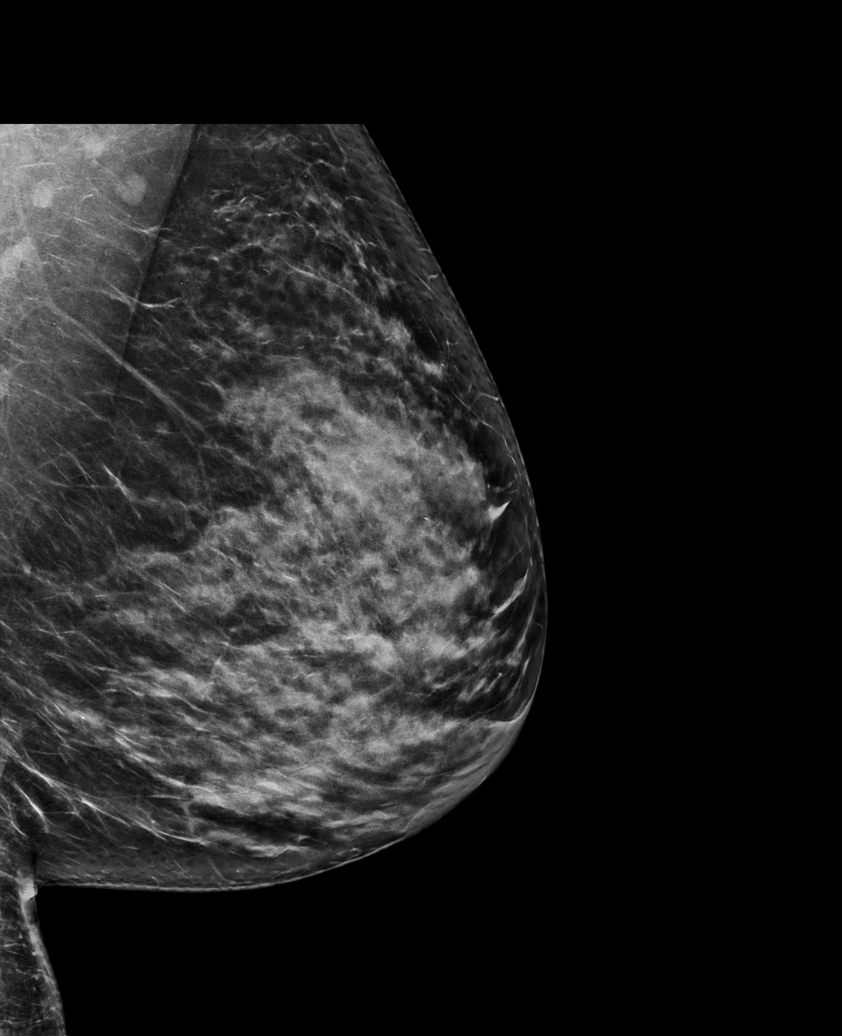

[R MLO synth-2D]
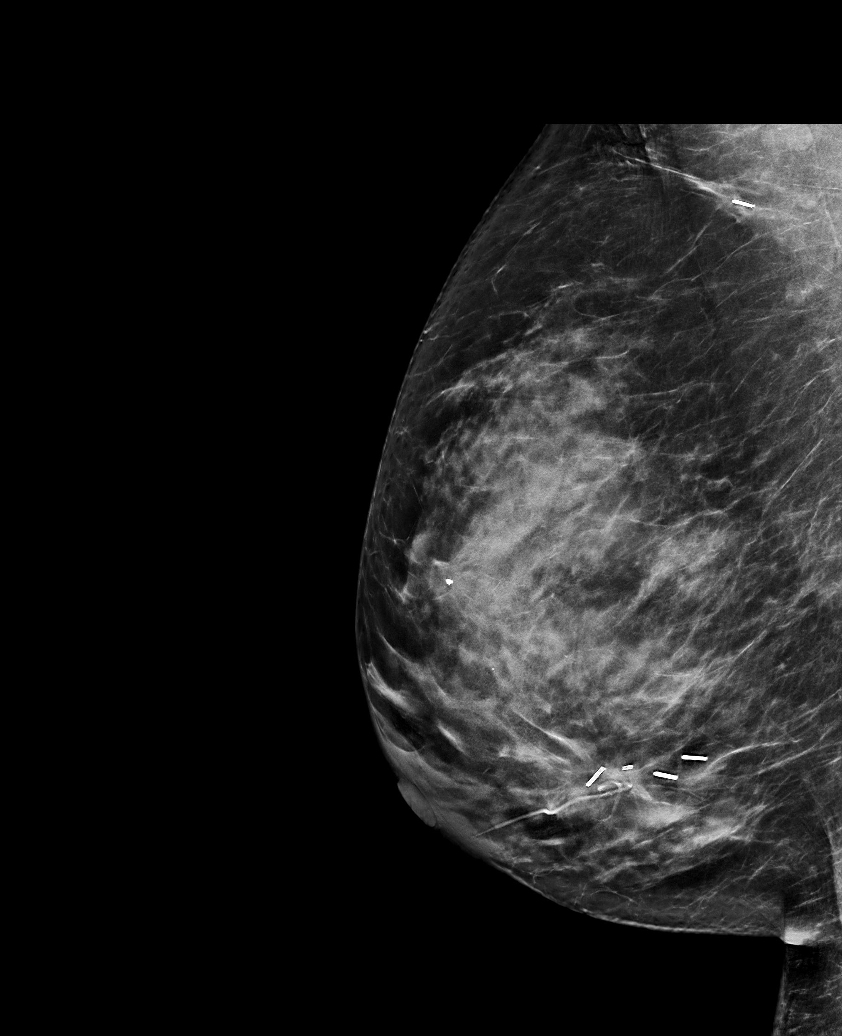

[R MLO tomo · tomo slice 41/82.0]
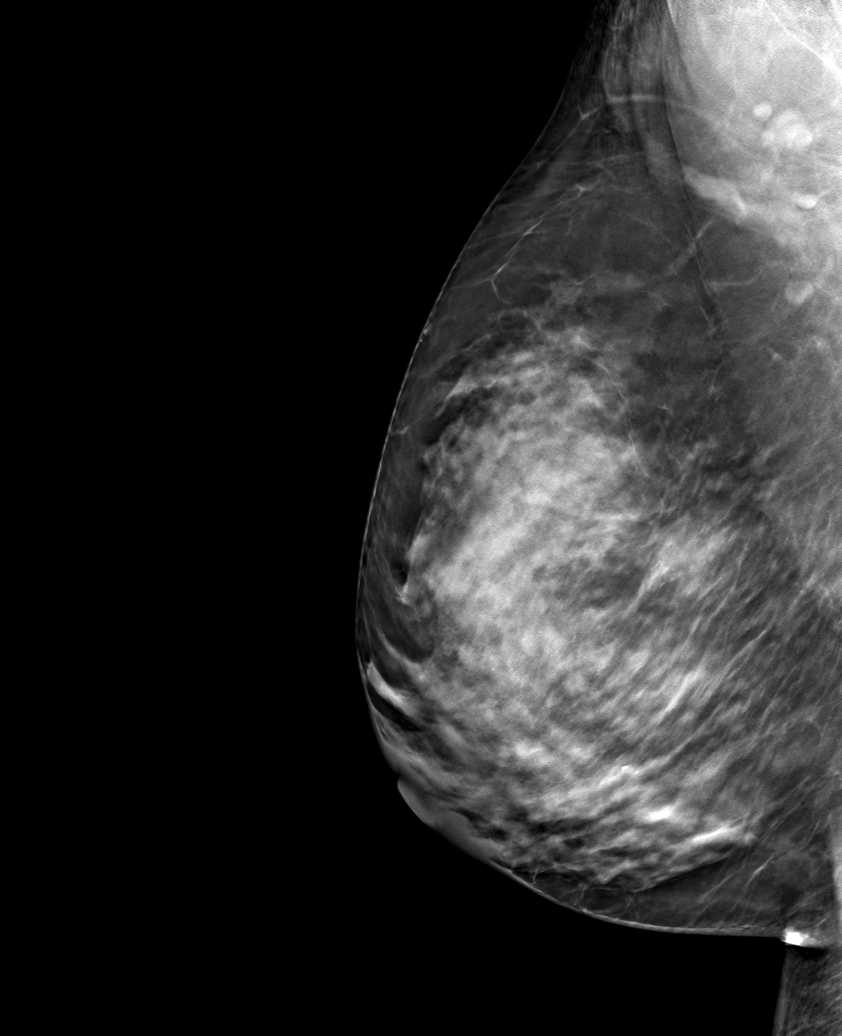

[6 of 25 positions shown; findings below may reference images not displayed]

ACR Breast Density Category c: The breast tissue is heterogeneously
dense, which may obscure small masses.
FINDINGS: The right lumpectomy site is stable. No suspicious masses,
calcifications, or distortion identified in either breast.
IMPRESSION: No mammographic evidence of malignancy.

RECOMMENDATION:
Per protocol, as the patient is now 2 or more years status post
lumpectomy, she may return to annual screening mammography in 1
year. However, given the history of breast cancer, the patient
remains eligible for annual diagnostic mammography if preferred.

I have discussed the findings and recommendations with the patient.
If applicable, a reminder letter will be sent to the patient
regarding the next appointment.

BI-RADS CATEGORY  2: Benign.

## 2021-03-26 ENCOUNTER — Encounter: Payer: Self-pay | Admitting: Hematology and Oncology

## 2021-03-26 ENCOUNTER — Other Ambulatory Visit: Payer: Self-pay | Admitting: *Deleted

## 2021-03-26 MED ORDER — TAMOXIFEN CITRATE 20 MG PO TABS: ORAL_TABLET | ORAL | 2 refills | Status: DC

## 2021-04-20 ENCOUNTER — Encounter: Payer: Self-pay | Admitting: Nurse Practitioner

## 2021-04-24 ENCOUNTER — Other Ambulatory Visit: Payer: Self-pay

## 2021-04-24 MED ORDER — ESOMEPRAZOLE MAGNESIUM 20 MG PO CPDR: DELAYED_RELEASE_CAPSULE | ORAL | 1 refills | Status: DC

## 2021-04-30 ENCOUNTER — Encounter (HOSPITAL_BASED_OUTPATIENT_CLINIC_OR_DEPARTMENT_OTHER): Payer: Self-pay | Admitting: Cardiovascular Disease

## 2021-04-30 ENCOUNTER — Ambulatory Visit (INDEPENDENT_AMBULATORY_CARE_PROVIDER_SITE_OTHER): Payer: BC Managed Care – PPO | Admitting: Cardiovascular Disease

## 2021-04-30 DIAGNOSIS — R0789 Other chest pain: Secondary | ICD-10-CM

## 2021-04-30 DIAGNOSIS — E7800 Pure hypercholesterolemia, unspecified: Secondary | ICD-10-CM | POA: Diagnosis not present

## 2021-04-30 DIAGNOSIS — R000 Tachycardia, unspecified: Secondary | ICD-10-CM | POA: Diagnosis not present

## 2021-04-30 HISTORY — DX: Pure hypercholesterolemia, unspecified: E78.00

## 2021-04-30 NOTE — Patient Instructions (Signed)
Medication Instructions:  ?Your physician recommends that you continue on your current medications as directed. Please refer to the Current Medication list given to you today.  ? ?Labwork: ?NONE ? ?Testing/Procedures: ?NONE ? ?Follow-Up: ?AS NEEDED  ? ?Any Other Special Instructions Will Be Listed Below (If Applicable). ? ?WILL REACH OUT TO MAKE SURE YOU DO  NOT BET BILLED FOR THE [place] ? ?LOOK INTO GETTING THE [place] MOBILE DEVICE  ? ?

## 2021-04-30 NOTE — Assessment & Plan Note (Signed)
Her heart rate seems to stabilize.  She has not had many episodes of tachycardia recently.  Unfortunately when she wore the monitor no data was captured.  Given that her symptoms are much less frequent we will not get monitoring for now.  She will purchase a [employer] mobile device and will send the recordings that she gets.  Echo was reassuring and she has not had any other cardiac symptoms. ?

## 2021-04-30 NOTE — Progress Notes (Signed)
?Cardiology Office Note:   ? ?Date:  04/30/2021  ? ?ID:  Vanessa Allen, DOB 05-Apr-1965, MRN [id number] ? ?PCP:  [name], FNP ?  ?[facility] Providers ?Cardiologist:  None    ? ?Referring MD: [name], FNP  ? ?No chief complaint on file. ? ?History of Present Illness:   ? ?Vanessa Allen is a 56 y.o. female with a hx of arthritis, diabetes type 2, and breast cancer, and OSA on CPAP, atherosclerosis of the aorta, and hyperlipidemia here for follow-up. She saw her PCP 06/2020 and was noted to be tachycardic. She was also reported to have exertional dyspnea and was referred to cardiology.   ? ?At her last appointment, she reported episodes of tachycardia with heart rates in the 140s to 150s at rest. The episodes make her short of breath. She associated them with radiation treatments. She works in the [employer] as a Marine scientist and is very active but does not exercise outside of work. She wore a 3-day monitor but the results have not been returned. There was reportedly no data on the monitor. Echo 10/2020 revealed LVEF 60-65% and was otherwise unremarkable.  ? ?Today, she is doing well. She reports she wore her monitor for the whole 2 weeks and it was functioning properly. Recently, her palpitations have been less frequent. Due to the stress of her job, she has not noticed recent episodes. However, her resting heart rate continues to be in the 90s and lower 100s. She denies any chest pain, shortness of breath, lightheadedness, headaches, syncope, orthopnea, PND, lower extremity edema or exertional symptoms. ? ?Past Medical History:  ?Diagnosis Date  ? Arthritis   ? knees and back   ? BRCA2 gene mutation positive in female   ? Cancer [facility])   ? Right Breast Cancer  ? Complication of anesthesia   ? Diabetes mellitus without complication ([place])   ? type 2   ? Endometriosis   ? Family history of adverse reaction to anesthesia   ? brother has issues with n/v after surgery   ? Family history of BRCA gene mutation   ? Family  history of breast cancer   ? Family history of leukemia   ? Family history of ovarian cancer   ? Family history of stomach cancer   ? Fibroid uterus   ? GERD (gastroesophageal reflux disease)   ? Personal history of radiation therapy   ? PONV (postoperative nausea and vomiting)   ? Pure hypercholesterolemia 04/30/2021  ? Tachycardia 11/13/2020  ? Venous insufficiency of lower extremity   ? ? ?Past Surgical History:  ?Procedure Laterality Date  ? APPENDECTOMY    ? BREAST BIOPSY Right 08/25/2019  ? BREAST LUMPECTOMY Right 09/23/2019  ? BREAST LUMPECTOMY WITH RADIOACTIVE SEED AND SENTINEL LYMPH NODE BIOPSY Right 09/23/2019  ? Procedure: RIGHT BREAST LUMPECTOMY WITH RADIOACTIVE SEED AND SENTINEL LYMPH NODE MAPPING;  Surgeon: Erroll Luna, MD;  Location: [facility];  Service: General;  Laterality: Right;  PEC BLOCK  ? CHOLECYSTECTOMY    ? lsc chole cystectomy and appendectomy 1 month after myomectomy  ? CYST EXCISION Left 12/13/2019  ? Procedure: Excision left dorsal wrist ganglion cyst;  Surgeon: Cindra Presume, MD;  Location: [facility];  Service: Plastics;  Laterality: Left;  45 min  ? ENDOVENOUS ABLATION SAPHENOUS VEIN W/ LASER Left 12/30/2019  ? endovenous laser ablation left greater saphenous vein by Gae Gallop MD   ? Lipoma removal Left   ? Left Lower Back  ? MYOMECTOMY    ?  ROBOTIC ASSISTED SALPINGO OOPHERECTOMY Bilateral 05/23/2020  ? Procedure: XI ROBOTIC ASSISTED SALPINGO OOPHORECTOMY ;ENTEROLYSIS;  Surgeon: Lafonda Mosses, MD;  Location: [facility];  Service: Gynecology;  Laterality: Bilateral;  ? WISDOM TOOTH EXTRACTION    ? ? ?Current Medications: ?Current Meds  ?Medication Sig  ? ACCU-CHEK GUIDE test strip USE TWICE DAILY TO CHECK BLOOD SUGAR BEFORE BREAKFAST AND DINNER  ? acidophilus (RISAQUAD) CAPS capsule Take 1 capsule by mouth daily.  ? ALPRAZolam (XANAX) 0.5 MG tablet TAKE 1 TABLET(0.5 MG) BY MOUTH THREE TIMES DAILY AS NEEDED FOR ANXIETY  ? aspirin EC 81 MG tablet Take 81 mg by mouth  daily. Swallow whole.  ? atorvastatin (LIPITOR) 10 MG tablet TAKE 1 TABLET(10 MG) BY MOUTH EVERY 7 DAYS  ? Biotin w/ Vitamins C & E (HAIR/SKIN/NAILS PO) Take 1 tablet by mouth daily.  ? Coenzyme Q10 (CO Q-10) 100 MG CAPS Take by mouth.  ? Continuous Blood Gluc Receiver (FREESTYLE LIBRE 14 DAY READER) DEVI USE TO CHECK BLOOD SUGAR BEFORE MEALS AND AT BEDTIME  ? Continuous Blood Gluc Sensor (FREESTYLE LIBRE 14 DAY SENSOR) MISC USE TO MEASURE BLOOD GLUCOSE FOUR TIMES DAILY BEFORE MEALS AND AT BEDTIME  ? esomeprazole (NEXIUM) 20 MG capsule TAKE 1 CAPSULE(20 MG) BY MOUTH DAILY  ? gabapentin (NEURONTIN) 300 MG capsule Take 1 capsule (300 mg total) by mouth at bedtime.  ? Glucose Blood (GLUCOSE METER TEST VI) by In Vitro route 2 (two) times daily.  ? metFORMIN (GLUCOPHAGE) 500 MG tablet TAKE 1 TABLET(500 MG) BY MOUTH EVERY DAY WITH BREAKFAST  ? Multiple Vitamin (MULTIVITAMIN WITH MINERALS) TABS tablet Take 1 tablet by mouth daily.  ? Omega-3 1000 MG CAPS Take 1,000 mg by mouth daily.  ? Semaglutide (RYBELSUS) 14 MG TABS Take 1 tablet (14 mg total) by mouth daily before breakfast.  ? Suvorexant (BELSOMRA) 10 MG TABS Take 1 tablet by mouth at bedtime as needed.  ? tamoxifen (NOLVADEX) 20 MG tablet TAKE 1 TABLET(20 MG) BY MOUTH DAILY  ? Turmeric (QC TUMERIC COMPLEX PO) Take 1,000 mg by mouth daily.  ? venlafaxine XR (EFFEXOR-XR) 75 MG 24 hr capsule Take 1 capsule (75 mg total) by mouth daily with breakfast.  ?  ? ?Allergies:   Patient has no known allergies.  ? ?Social History  ? ?Socioeconomic History  ? Marital status: Single  ?  Spouse name: Not on file  ? Number of children: Not on file  ? Years of education: Not on file  ? Highest education level: Not on file  ?Occupational History  ? Occupation: travel nurse  ?Tobacco Use  ? Smoking status: Never  ? Smokeless tobacco: Never  ?Vaping Use  ? Vaping Use: Never used  ?Substance and Sexual Activity  ? Alcohol use: Yes  ?  Alcohol/week: 0.0 standard drinks  ?  Comment: rare    ? Drug use: Never  ? Sexual activity: Not Currently  ?Other Topics Concern  ? Not on file  ?Social History Narrative  ? Not on file  ? ?Social Determinants of Health  ? ?Financial Resource Strain: Low Risk   ? Difficulty of Paying Living Expenses: Not hard at all  ?Food Insecurity: No Food Insecurity  ? Worried About Charity fundraiser in the Last Year: Never true  ? Ran Out of Food in the Last Year: Never true  ?Transportation Needs: No Transportation Needs  ? Lack of Transportation (Medical): No  ? Lack of Transportation (Non-Medical): No  ?Physical Activity: Inactive  ?  Days of Exercise per Week: 0 days  ? Minutes of Exercise per Session: 0 min  ?Stress: Not on file  ?Social Connections: Not on file  ?  ? ?Family History: ?The patient's family history includes BRCA 1/2 in her niece; Breast cancer (age of onset: 60) in her niece; CAD in her father; Diabetes in her father; Heart disease in her father; Heart failure in her mother; Hyperlipidemia in her mother; Hypertension in her brother and mother; Leukemia in her paternal aunt; Ovarian cancer in her cousin, cousin, and cousin; Stomach cancer (age of onset: 14) in her father; Varicose Veins in her mother. There is no history of Colon cancer, Pancreatic cancer, or Prostate cancer. ? ?ROS:   ?Please see the history of present illness.   ?(+) Palpitations ?All other systems reviewed and are negative. ? ?EKGs/Labs/Other Studies Reviewed:   ? ?The following studies were reviewed today: ?Echo 10/2020 ?1. Left ventricular ejection fraction, by estimation, is 60 to 65%. The left ventricle has normal function. The left ventricle has no regional wall motion abnormalities. Indeterminate diastolic filling due to E-A fusion. The average left ventricular  ?global longitudinal strain is -25.9 %. The global longitudinal strain is normal.  ? 2. Right ventricular systolic function is normal. The right ventricular size is normal. Tricuspid regurgitation signal is inadequate for  assessing PA pressure.  ? 3. The mitral valve is normal in structure. No evidence of mitral valve regurgitation. No evidence of mitral stenosis.  ? 4. The aortic valve is tricuspid. Aortic valve regurgitation

## 2021-04-30 NOTE — Assessment & Plan Note (Signed)
Resolved.  No further testing indicated at this time. ?

## 2021-04-30 NOTE — Assessment & Plan Note (Signed)
Lipids are very well controlled.  Continue atorvastatin. ?

## 2021-05-01 ENCOUNTER — Encounter: Payer: Self-pay | Admitting: Nurse Practitioner

## 2021-05-01 ENCOUNTER — Ambulatory Visit (INDEPENDENT_AMBULATORY_CARE_PROVIDER_SITE_OTHER): Payer: BC Managed Care – PPO | Admitting: Nurse Practitioner

## 2021-05-01 VITALS — BP 122/84 | HR 100 | Temp 98.1°F | Ht 66.0 in | Wt 164.0 lb

## 2021-05-01 DIAGNOSIS — E119 Type 2 diabetes mellitus without complications: Secondary | ICD-10-CM | POA: Diagnosis not present

## 2021-05-01 DIAGNOSIS — E1151 Type 2 diabetes mellitus with diabetic peripheral angiopathy without gangrene: Secondary | ICD-10-CM

## 2021-05-01 DIAGNOSIS — I739 Peripheral vascular disease, unspecified: Secondary | ICD-10-CM

## 2021-05-01 MED ORDER — RYBELSUS 14 MG PO TABS: 1.0000 | ORAL_TABLET | Freq: Every day | ORAL | 1 refills | Status: DC

## 2021-05-01 NOTE — Patient Instructions (Signed)
Type 2 Diabetes Mellitus, Diagnosis, Adult °Type 2 diabetes (type 2 diabetes mellitus) is a long-term (chronic) disease. It may happen when there is one or both of these problems: °The pancreas does not make enough insulin. °The body does not react in a normal way to insulin that it makes. °Insulin lets sugars go into cells in your body. If you have type 2 diabetes, sugars cannot get into your cells. Sugars build up in the blood. This causes high blood sugar. °What are the causes? °The exact cause of this condition is not known. °What increases the risk? °Having type 2 diabetes in your family. °Being overweight or very overweight. °Not being active. °Your body not reacting in a normal way to the insulin it makes. °Having higher than normal blood sugar over time. °Having a type of diabetes when you were pregnant. °Having a condition that causes small fluid-filled sacs on your ovaries. °What are the signs or symptoms? °At first, you may have no symptoms. You will get symptoms slowly. They may include: °More thirst than normal. °More hunger than normal. °Needing to pee more than normal. °Losing weight without trying. °Feeling tired. °Feeling weak. °Seeing things blurry. °Dark patches on your skin. °How is this treated? °This condition may be treated by a diabetes expert. You may need to: °Follow an eating plan made by a food expert (dietitian). °Get regular exercise. °Find ways to deal with stress. °Check blood sugar as often as told. °Take medicines. °Your doctor will set treatment goals for you. Your blood sugar should be at these levels: °Before meals: 80-130 mg/dL (4.4-7.2 mmol/L). °After meals: below 180 mg/dL (10 mmol/L). °Over the last 2-3 months: less than 7%. °Follow these instructions at home: °Medicines °Take your diabetes medicines or insulin every day. °Take medicines as told to help you prevent other problems caused by this condition. You may need: °Aspirin. °Medicine to lower cholesterol. °Medicine to  control blood pressure. °Questions to ask your doctor °Should I meet with a diabetes educator? °What medicines do I need, and when should I take them? °What will I need to treat my condition at home? °When should I check my blood sugar? °Where can I find a support group? °Who can I call if I have questions? °When is my next doctor visit? °General instructions °Take over-the-counter and prescription medicines only as told by your doctor. °Keep all follow-up visits. °Where to find more information °For help and guidance and more information about diabetes, please go to: °American Diabetes Association (ADA): www.diabetes.org °American Association of Diabetes Care and Education Specialists (ADCES): www.diabeteseducator.org °International Diabetes Federation (IDF): www.idf.org °Contact a doctor if: °Your blood sugar is at or above 240 mg/dL (13.3 mmol/L) for 2 days in a row. °You have been sick for 2 days or more, and you are not getting better. °You have had a fever for 2 days or more, and you are not getting better. °You have any of these problems for more than 6 hours: °You cannot eat or drink. °You feel like you may vomit. °You vomit. °You have watery poop (diarrhea). °Get help right away if: °Your blood sugar is lower than 54 mg/dL (3 mmol/L). °You feel mixed up (confused). °You have trouble thinking clearly. °You have trouble breathing. °You have medium or large ketone levels in your pee. °These symptoms may be an emergency. Get help right away. Call your local emergency services (911 in the [place] °Do not wait to see if the symptoms will go away. °Do not drive yourself   to the hospital. °Summary °Type 2 diabetes is a long-term disease. Your pancreas may not make enough insulin, or your body may not react in a normal way to insulin that it makes. °This condition is treated with an eating plan, lifestyle changes, and medicines. °Your doctor will set treatment goals for you. These will help you keep your blood sugar  in a healthy range. °Keep all follow-up visits. °This information is not intended to replace advice given to you by your health care provider. Make sure you discuss any questions you have with your health care provider. °Document Revised: 04/10/2020 Document Reviewed: 04/10/2020 °Elsevier Patient Education © 2022 [employer] ° °

## 2021-05-01 NOTE — Progress Notes (Signed)
?I,[name],acting as a scribe for [name], FNP.,have documented all relevant documentation on the behalf of [name], FNP,as directed by  [name], FNP while in the presence of [name], [place].  ? ?This visit occurred during the SARS-CoV-2 public health emergency.  Safety protocols were in place, including screening questions prior to the visit, additional usage of staff PPE, and extensive cleaning of exam room while observing appropriate contact time as indicated for disinfecting solutions. ? ?Subjective:  ?  ? Patient ID: Vanessa Allen , female    DOB: 07-Sep-1965 , 56 y.o.   MRN: [id number] ? ? ?Chief Complaint  ?Patient presents with  ? Diabetes  ? ? ?HPI ? ?Pt presents today for DM f/I, she has not been seen since 07/27/2020. She has no specific questions or concerns.  ?She reports having her eye exam this year, [facility].  She is back working in [place].  She seen Cardiology - no data crossed over, she has had an Echo, she does not feel the palpitations as much. She got a device to check if her heart rate is abnormal from [place]. She has left her rybelsus in [place] and will not return until 4/15.  ? ?Wt Readings from Last 3 Encounters: ?05/01/21 : 164 lb (74.4 kg) ?04/30/21 : 167 lb 6.4 oz (75.9 kg) ?12/28/20 : 175 lb 14.4 oz (79.8 kg) ? ? ? ?  ? ?Past Medical History:  ?Diagnosis Date  ? Arthritis   ? knees and back   ? Atypical chest pain 07/20/2018  ? BRCA2 gene mutation positive in female   ? Cancer [facility])   ? Right Breast Cancer  ? Chest discomfort 05/21/2018  ? Complication of anesthesia   ? Diabetes mellitus without complication ([place])   ? type 2   ? Endometriosis   ? Family history of adverse reaction to anesthesia   ? brother has issues with n/v after surgery   ? Family history of BRCA gene mutation   ? Family history of breast cancer   ? Family history of leukemia   ? Family history of ovarian cancer   ? Family history of stomach cancer   ? Fibroid uterus   ?  GERD (gastroesophageal reflux disease)   ? Personal history of radiation therapy   ? PONV (postoperative nausea and vomiting)   ? Pure hypercholesterolemia 04/30/2021  ? Tachycardia 11/13/2020  ? Venous insufficiency of lower extremity   ?  ? ?Family History  ?Problem Relation Age of Onset  ? Heart failure Mother   ? Hyperlipidemia Mother   ? Hypertension Mother   ? Varicose Veins Mother   ? Diabetes Father   ? Heart disease Father   ? Stomach cancer Father 70  ? CAD Father   ? Hypertension Brother   ? Leukemia Paternal Aunt   ?     dx. >50  ? Ovarian cancer Cousin   ?     d. <50 (maternal first cousin)  ? Ovarian cancer Cousin   ?     d. <50 (maternal first cousin)  ? Ovarian cancer Cousin   ?     d. late 19s (maternal first cousin)  ? Breast cancer Niece 60  ? BRCA 1/2 Niece   ? Colon cancer Neg Hx   ? Pancreatic cancer Neg Hx   ? Prostate cancer Neg Hx   ? ? ? ?Current Outpatient Medications:  ?  ACCU-CHEK GUIDE test strip, USE TWICE DAILY TO CHECK BLOOD SUGAR  BEFORE BREAKFAST AND DINNER, Disp: 100 strip, Rfl: 5 ?  acidophilus (RISAQUAD) CAPS capsule, Take 1 capsule by mouth daily., Disp: , Rfl:  ?  ALPRAZolam (XANAX) 0.5 MG tablet, TAKE 1 TABLET(0.5 MG) BY MOUTH THREE TIMES DAILY AS NEEDED FOR ANXIETY, Disp: 30 tablet, Rfl: 2 ?  aspirin EC 81 MG tablet, Take 81 mg by mouth daily. Swallow whole., Disp: , Rfl:  ?  atorvastatin (LIPITOR) 10 MG tablet, TAKE 1 TABLET(10 MG) BY MOUTH EVERY 7 DAYS, Disp: 12 tablet, Rfl: 0 ?  Biotin w/ Vitamins C & E (HAIR/SKIN/NAILS PO), Take 1 tablet by mouth daily., Disp: , Rfl:  ?  Coenzyme Q10 (CO Q-10) 100 MG CAPS, Take by mouth., Disp: , Rfl:  ?  Continuous Blood Gluc Receiver (FREESTYLE LIBRE 14 DAY READER) DEVI, USE TO CHECK BLOOD SUGAR BEFORE MEALS AND AT BEDTIME, Disp: 1 each, Rfl: 2 ?  Continuous Blood Gluc Sensor (FREESTYLE LIBRE 14 DAY SENSOR) MISC, USE TO MEASURE BLOOD GLUCOSE FOUR TIMES DAILY BEFORE MEALS AND AT BEDTIME, Disp: 2 each, Rfl: 11 ?  esomeprazole (NEXIUM) 20 MG  capsule, TAKE 1 CAPSULE(20 MG) BY MOUTH DAILY, Disp: 90 capsule, Rfl: 1 ?  gabapentin (NEURONTIN) 300 MG capsule, Take 1 capsule (300 mg total) by mouth at bedtime., Disp: 90 capsule, Rfl: 4 ?  Glucose Blood (GLUCOSE METER TEST VI), by In Vitro route 2 (two) times daily., Disp: , Rfl:  ?  metFORMIN (GLUCOPHAGE) 500 MG tablet, TAKE 1 TABLET(500 MG) BY MOUTH EVERY DAY WITH BREAKFAST, Disp: 90 tablet, Rfl: 1 ?  Multiple Vitamin (MULTIVITAMIN WITH MINERALS) TABS tablet, Take 1 tablet by mouth daily., Disp: , Rfl:  ?  Omega-3 1000 MG CAPS, Take 1,000 mg by mouth daily., Disp: , Rfl:  ?  Suvorexant (BELSOMRA) 10 MG TABS, Take 1 tablet by mouth at bedtime as needed., Disp: 90 tablet, Rfl: 1 ?  tamoxifen (NOLVADEX) 20 MG tablet, TAKE 1 TABLET(20 MG) BY MOUTH DAILY, Disp: 90 tablet, Rfl: 2 ?  Turmeric (QC TUMERIC COMPLEX PO), Take 1,000 mg by mouth daily., Disp: , Rfl:  ?  venlafaxine XR (EFFEXOR-XR) 75 MG 24 hr capsule, Take 1 capsule (75 mg total) by mouth daily with breakfast., Disp: 90 capsule, Rfl: 4 ?  Semaglutide (RYBELSUS) 14 MG TABS, Take 1 tablet (14 mg total) by mouth daily before breakfast., Disp: 90 tablet, Rfl: 1  ? ?No Known Allergies  ? ?Review of Systems  ?Constitutional: Negative.   ?HENT: Negative.    ?Eyes: Negative.   ?Respiratory: Negative.    ?Cardiovascular: Negative.  Negative for chest pain, palpitations and leg swelling.  ?Gastrointestinal: Negative.   ?Endocrine: Negative for polydipsia, polyphagia and polyuria.  ?Genitourinary: Negative.   ?Musculoskeletal: Negative.   ?Skin: Negative.   ?Neurological: Negative.   ?Hematological: Negative.   ?Psychiatric/Behavioral:  Negative for sleep disturbance.    ? ?Today's Vitals  ? 05/01/21 1511  ?BP: 122/84  ?Pulse: 100  ?Temp: 98.1 ?F (36.7 ?C)  ?SpO2: 98%  ?Weight: 164 lb (74.4 kg)  ?Height: _0  (1.676 m)  ? ?Body mass index is 26.47 kg/m?.  ? ?Objective:  ?Physical Exam ?Vitals reviewed.  ?Constitutional:   ?   General: She is not in acute  distress. ?   Appearance: Normal appearance.  ?Cardiovascular:  ?   Rate and Rhythm: Normal rate and regular rhythm.  ?   Pulses: Normal pulses.  ?   Heart sounds: Normal heart sounds. No murmur heard. ?Pulmonary:  ?   Effort:  Pulmonary effort is normal. No respiratory distress.  ?   Breath sounds: Normal breath sounds. No wheezing.  ?Skin: ?   General: Skin is warm and dry.  ?   Capillary Refill: Capillary refill takes less than 2 seconds.  ?   Comments: Darkened medial aspect left lower extremity  ?Neurological:  ?   General: No focal deficit present.  ?   Mental Status: She is alert and oriented to person, place, and time.  ?   Cranial Nerves: No cranial nerve deficit.  ?   Motor: No weakness.  ?Psychiatric:     ?   Mood and Affect: Mood normal.     ?   Behavior: Behavior normal.     ?   Thought Content: Thought content normal.     ?   Judgment: Judgment normal.  ?  ? ?   ?Assessment And Plan:  ?   ?1. Type 2 diabetes mellitus with diabetic peripheral angiopathy without gangrene, without long-term current use of insulin ([place]) ?Comments: HgbA1c has been improving continue Rybelsus, continue current medications. Given sample of Rybelsus lower dose until she returns to [place] ? ?- Hemoglobin A1c ?- Microalbumin / creatinine urine ratio ?- Comprehensive metabolic panel with eGFR ? ?2. PVD (peripheral vascular disease) ([place]) ?Comments: Continues to have a darkened area to left medial lower extremity ?  ? ? ?Patient was given opportunity to ask questions. Patient verbalized understanding of the plan and was able to repeat key elements of the plan. All questions were answered to their satisfaction.  ?[name], FNP  ? ?I, [name], FNP, have reviewed all documentation for this visit. The documentation on 05/01/21 for the exam, diagnosis, procedures, and orders are all accurate and complete.  ? ?IF YOU HAVE BEEN REFERRED TO A SPECIALIST, IT MAY TAKE 1-2 WEEKS TO SCHEDULE/PROCESS THE REFERRAL. IF YOU HAVE NOT  HEARD FROM US/SPECIALIST IN TWO WEEKS, PLEASE GIVE [place] A CALL AT [phone].  ? ?THE PATIENT IS ENCOURAGED TO PRACTICE SOCIAL DISTANCING DUE TO THE COVID-19 PANDEMIC.   ?

## 2021-05-02 LAB — COMPREHENSIVE METABOLIC PANEL
ALT: 33 IU/L — ABNORMAL HIGH (ref 0–32)
AST: 28 IU/L (ref 0–40)
Albumin/Globulin Ratio: 1.9 (ref 1.2–2.2)
Albumin: 5 g/dL — ABNORMAL HIGH (ref 3.8–4.9)
Alkaline Phosphatase: 76 IU/L (ref 44–121)
BUN/Creatinine Ratio: 15 (ref 9–23)
BUN: 13 mg/dL (ref 6–24)
Bilirubin Total: 0.4 mg/dL (ref 0.0–1.2)
CO2: 19 mmol/L — ABNORMAL LOW (ref 20–29)
Calcium: 9.8 mg/dL (ref 8.7–10.2)
Chloride: 106 mmol/L (ref 96–106)
Creatinine, Ser: 0.89 mg/dL (ref 0.57–1.00)
Globulin, Total: 2.6 g/dL (ref 1.5–4.5)
Glucose: 118 mg/dL — ABNORMAL HIGH (ref 70–99)
Potassium: 3.9 mmol/L (ref 3.5–5.2)
Sodium: 144 mmol/L (ref 134–144)
Total Protein: 7.6 g/dL (ref 6.0–8.5)
eGFR: 77 mL/min/{1.73_m2} (ref >59)

## 2021-05-02 LAB — HEMOGLOBIN A1C
Est. average glucose Bld gHb Est-mCnc: 131 mg/dL
Hgb A1c MFr Bld: 6.2 % — ABNORMAL HIGH (ref 4.8–5.6)

## 2021-05-07 ENCOUNTER — Ambulatory Visit: Payer: BC Managed Care – PPO | Admitting: Nurse Practitioner

## 2021-06-12 ENCOUNTER — Other Ambulatory Visit: Payer: Self-pay

## 2021-06-12 MED ORDER — RYBELSUS 14 MG PO TABS: 1.0000 | ORAL_TABLET | Freq: Every day | ORAL | 1 refills | Status: DC

## 2021-06-18 ENCOUNTER — Ambulatory Visit: Payer: Self-pay

## 2021-07-10 ENCOUNTER — Telehealth: Payer: Self-pay | Admitting: Hematology and Oncology

## 2021-07-10 NOTE — Telephone Encounter (Signed)
Rescheduled appointment per providers. Patient aware.  ? ?

## 2021-07-11 ENCOUNTER — Encounter: Payer: Self-pay | Admitting: Hematology and Oncology

## 2021-07-12 ENCOUNTER — Other Ambulatory Visit: Payer: Self-pay | Admitting: Internal Medicine

## 2021-07-12 ENCOUNTER — Other Ambulatory Visit: Payer: Self-pay

## 2021-07-12 DIAGNOSIS — F419 Anxiety disorder, unspecified: Secondary | ICD-10-CM

## 2021-07-12 MED ORDER — ALPRAZOLAM 0.5 MG PO TABS: ORAL_TABLET | ORAL | 0 refills | Status: DC

## 2021-07-12 MED ORDER — ALPRAZOLAM 0.5 MG PO TABS: ORAL_TABLET | ORAL | 2 refills | Status: DC

## 2021-07-12 MED ORDER — VENLAFAXINE HCL ER 75 MG PO CP24: 75.0000 mg | ORAL_CAPSULE | Freq: Every day | ORAL | 0 refills | Status: DC

## 2021-08-02 ENCOUNTER — Other Ambulatory Visit: Payer: Self-pay | Admitting: Oncology

## 2021-08-02 ENCOUNTER — Other Ambulatory Visit: Payer: Self-pay | Admitting: *Deleted

## 2021-08-02 DIAGNOSIS — Z8481 Family history of carrier of genetic disease: Secondary | ICD-10-CM

## 2021-08-02 DIAGNOSIS — Z803 Family history of malignant neoplasm of breast: Secondary | ICD-10-CM

## 2021-08-02 DIAGNOSIS — C50911 Malignant neoplasm of unspecified site of right female breast: Secondary | ICD-10-CM

## 2021-08-02 DIAGNOSIS — D0511 Intraductal carcinoma in situ of right breast: Secondary | ICD-10-CM

## 2021-08-02 DIAGNOSIS — Z1502 Genetic susceptibility to malignant neoplasm of ovary: Secondary | ICD-10-CM

## 2021-08-03 ENCOUNTER — Other Ambulatory Visit: Payer: Self-pay | Admitting: *Deleted

## 2021-08-03 DIAGNOSIS — Z1501 Genetic susceptibility to malignant neoplasm of breast: Secondary | ICD-10-CM

## 2021-08-06 ENCOUNTER — Encounter: Payer: Self-pay | Admitting: Hematology and Oncology

## 2021-08-06 ENCOUNTER — Other Ambulatory Visit (HOSPITAL_COMMUNITY)
Admission: RE | Admit: 2021-08-06 | Discharge: 2021-08-06 | Disposition: A | Discharge status: Home / Self Care | Payer: BC Managed Care – PPO | Source: Ambulatory Visit

## 2021-08-06 ENCOUNTER — Other Ambulatory Visit: Payer: Self-pay

## 2021-08-06 ENCOUNTER — Inpatient Hospital Stay: Payer: BC Managed Care – PPO | Attending: Hematology and Oncology

## 2021-08-06 ENCOUNTER — Encounter: Payer: Self-pay | Admitting: Nurse Practitioner

## 2021-08-06 ENCOUNTER — Ambulatory Visit (INDEPENDENT_AMBULATORY_CARE_PROVIDER_SITE_OTHER): Payer: BC Managed Care – PPO | Admitting: Nurse Practitioner

## 2021-08-06 ENCOUNTER — Inpatient Hospital Stay (HOSPITAL_BASED_OUTPATIENT_CLINIC_OR_DEPARTMENT_OTHER): Payer: BC Managed Care – PPO | Admitting: Hematology and Oncology

## 2021-08-06 VITALS — BP 120/78 | HR 78 | Temp 98.0°F | Ht 66.0 in | Wt 168.2 lb

## 2021-08-06 VITALS — BP 126/88 | HR 80 | Temp 97.5°F | Resp 17 | Wt 168.4 lb

## 2021-08-06 DIAGNOSIS — D0511 Intraductal carcinoma in situ of right breast: Secondary | ICD-10-CM

## 2021-08-06 DIAGNOSIS — Z1501 Genetic susceptibility to malignant neoplasm of breast: Secondary | ICD-10-CM

## 2021-08-06 DIAGNOSIS — Z1509 Genetic susceptibility to other malignant neoplasm: Secondary | ICD-10-CM

## 2021-08-06 DIAGNOSIS — Z124 Encounter for screening for malignant neoplasm of cervix: Secondary | ICD-10-CM

## 2021-08-06 DIAGNOSIS — Z923 Personal history of irradiation: Secondary | ICD-10-CM | POA: Diagnosis not present

## 2021-08-06 DIAGNOSIS — N951 Menopausal and female climacteric states: Secondary | ICD-10-CM | POA: Diagnosis not present

## 2021-08-06 DIAGNOSIS — I700 Atherosclerosis of aorta: Secondary | ICD-10-CM | POA: Diagnosis not present

## 2021-08-06 DIAGNOSIS — Z79810 Long term (current) use of selective estrogen receptor modulators (SERMs): Secondary | ICD-10-CM | POA: Insufficient documentation

## 2021-08-06 DIAGNOSIS — Z0000 Encounter for general adult medical examination without abnormal findings: Secondary | ICD-10-CM | POA: Diagnosis not present

## 2021-08-06 DIAGNOSIS — Z79899 Other long term (current) drug therapy: Secondary | ICD-10-CM | POA: Insufficient documentation

## 2021-08-06 DIAGNOSIS — F419 Anxiety disorder, unspecified: Secondary | ICD-10-CM | POA: Diagnosis not present

## 2021-08-06 DIAGNOSIS — Z1502 Genetic susceptibility to malignant neoplasm of ovary: Secondary | ICD-10-CM

## 2021-08-06 DIAGNOSIS — G4709 Other insomnia: Secondary | ICD-10-CM

## 2021-08-06 DIAGNOSIS — E1151 Type 2 diabetes mellitus with diabetic peripheral angiopathy without gangrene: Secondary | ICD-10-CM

## 2021-08-06 LAB — POCT URINALYSIS DIPSTICK
Bilirubin, UA: NEGATIVE
Glucose, UA: NEGATIVE
Ketones, UA: NEGATIVE
Nitrite, UA: NEGATIVE
Protein, UA: NEGATIVE
Spec Grav, UA: 1.015 (ref 1.010–1.025)
Urobilinogen, UA: 0.2 E.U./dL
pH, UA: 6 (ref 5.0–8.0)

## 2021-08-06 LAB — CMP14+EGFR
ALT: 38 IU/L — ABNORMAL HIGH (ref 0–32)
AST: 29 IU/L (ref 0–40)
Albumin/Globulin Ratio: 1.8 (ref 1.2–2.2)
Albumin: 4.5 g/dL (ref 3.8–4.9)
Alkaline Phosphatase: 62 IU/L (ref 44–121)
BUN/Creatinine Ratio: 11 (ref 9–23)
BUN: 9 mg/dL (ref 6–24)
Bilirubin Total: 0.4 mg/dL (ref 0.0–1.2)
CO2: 23 mmol/L (ref 20–29)
Calcium: 9.4 mg/dL (ref 8.7–10.2)
Chloride: 105 mmol/L (ref 96–106)
Creatinine, Ser: 0.85 mg/dL (ref 0.57–1.00)
Globulin, Total: 2.5 g/dL (ref 1.5–4.5)
Glucose: 106 mg/dL — ABNORMAL HIGH (ref 70–99)
Potassium: 4.2 mmol/L (ref 3.5–5.2)
Sodium: 142 mmol/L (ref 134–144)
Total Protein: 7 g/dL (ref 6.0–8.5)
eGFR: 82 mL/min/{1.73_m2} (ref >59)

## 2021-08-06 LAB — CBC WITH DIFFERENTIAL/PLATELET
Basophils Absolute: 0 10*3/uL (ref 0.0–0.2)
Basos: 1 %
EOS (ABSOLUTE): 0.1 10*3/uL (ref 0.0–0.4)
Eos: 2 %
Hematocrit: 40.3 % (ref 34.0–46.6)
Hemoglobin: 13.3 g/dL (ref 11.1–15.9)
Immature Grans (Abs): 0 10*3/uL (ref 0.0–0.1)
Immature Granulocytes: 0 %
Lymphocytes Absolute: 1.5 10*3/uL (ref 0.7–3.1)
Lymphs: 42 %
MCH: 26.7 pg (ref 26.6–33.0)
MCHC: 33 g/dL (ref 31.5–35.7)
MCV: 81 fL (ref 79–97)
Monocytes Absolute: 0.2 10*3/uL (ref 0.1–0.9)
Monocytes: 7 %
Neutrophils Absolute: 1.8 10*3/uL (ref 1.4–7.0)
Neutrophils: 48 %
Platelets: 245 10*3/uL (ref 150–450)
RBC: 4.99 x10E6/uL (ref 3.77–5.28)
RDW: 12.6 % (ref 11.7–15.4)
WBC: 3.6 10*3/uL (ref 3.4–10.8)

## 2021-08-06 LAB — LIPID PANEL
Chol/HDL Ratio: 2.7 ratio (ref 0.0–4.4)
Cholesterol, Total: 132 mg/dL (ref 100–199)
HDL: 49 mg/dL (ref >39)
LDL Chol Calc (NIH): 69 mg/dL (ref 0–99)
Triglycerides: 68 mg/dL (ref 0–149)
VLDL Cholesterol Cal: 14 mg/dL (ref 5–40)

## 2021-08-06 LAB — HEMOGLOBIN A1C
Est. average glucose Bld gHb Est-mCnc: 134 mg/dL
Hgb A1c MFr Bld: 6.3 % — ABNORMAL HIGH (ref 4.8–5.6)

## 2021-08-06 MED ORDER — ALPRAZOLAM 0.5 MG PO TABS: ORAL_TABLET | ORAL | 0 refills | Status: DC

## 2021-08-06 NOTE — Patient Instructions (Signed)

## 2021-08-06 NOTE — Progress Notes (Signed)
[place]  [phone] [phone]     ID: Vanessa Allen DOB: 1965/11/22  MR#: [id number]  JJK#:[id number]  Patient Care Team: Vanessa Allen, Vanessa Allen as PCP - General (General Practice) Vanessa Luna, MD as Consulting Physician (General Surgery) Vanessa Rudd, MD as Consulting Physician (Radiation Oncology) Vanessa Kaufmann, RN as Oncology Nurse Navigator Vanessa Germany, RN as Oncology Nurse Navigator Vanessa Mosses, MD as Consulting Physician (Gynecologic Oncology) Vanessa Pike, MD as Consulting Physician (Hematology and Oncology) Vanessa Pike, MD OTHER MD:  CHIEF COMPLAINT: Ductal carcinoma in situ; BRCA2+  CURRENT TREATMENT: tamoxifen  INTERVAL HISTORY:  Vanessa Allen returns today for follow up of her noninvasive breast cancer. She started tamoxifen on 12/08/2019.  She is generally tolerating this well.   She is also taking venlafaxine 75 mg daily and this has been helping her hot flashes.  She denies any other new complaints today. She underwent BSO because of her BRCA2 mutation.  She follows up with mammogram alternating with MRI for breast cancer screening. She is up-to-date with her colon cancer screening.  She does not follow-up with dermatology regularly. Rest of the pertinent 10 point ROS reviewed and negative  COVID 19 VACCINATION STATUS: [place] x2, status post booster   HISTORY OF CURRENT ILLNESS: From the original intake note:  Vanessa Allen had routine screening mammography on 07/23/2019 showing a possible abnormality in the right breast. She underwent right diagnostic mammography with tomography at [facility] on 08/19/2019 showing: breast density category C; indeterminate 1.9 cm medial right breast calcifications.  Accordingly on 08/25/2019 she proceeded to biopsy of the right breast area in question. The pathology from this procedure ([id number]) showed: ductal carcinoma involving a papillary lesion. There is at  least intermediate grade ductal carcinoma in situ, however, the lesion is fragmented hampering assessment and invasion can not be excluded. Lymph node sampling at the time of excision is recommended. Prognostic indicators significant for: estrogen receptor, 90% positive and progesterone receptor, 90% positive, both with strong staining intensity.  Right axilla ultrasound was performed on 08/31/2019 showing no abnormal-appearing lymph nodes.   The patient's subsequent history is as detailed below.   PAST MEDICAL HISTORY: Past Medical History:  Diagnosis Date   Arthritis    knees and back    Atypical chest pain 07/20/2018   BRCA2 gene mutation positive in female    Cancer [facility])    Right Breast Cancer   Chest discomfort 3/81/0175   Complication of anesthesia    Diabetes mellitus without complication ([place])    type 2    Endometriosis    Family history of adverse reaction to anesthesia    brother has issues with n/v after surgery    Family history of BRCA gene mutation    Family history of breast cancer    Family history of leukemia    Family history of ovarian cancer    Family history of stomach cancer    Fibroid uterus    GERD (gastroesophageal reflux disease)    Personal history of radiation therapy    PONV (postoperative nausea and vomiting)    Pure hypercholesterolemia 04/30/2021   Tachycardia 11/13/2020   Venous insufficiency of lower extremity     PAST SURGICAL HISTORY: Past Surgical History:  Procedure Laterality Date   APPENDECTOMY     BREAST BIOPSY Right 08/25/2019   BREAST LUMPECTOMY Right 09/23/2019   BREAST LUMPECTOMY WITH RADIOACTIVE SEED AND SENTINEL LYMPH NODE BIOPSY Right 09/23/2019   Procedure: RIGHT BREAST LUMPECTOMY WITH  RADIOACTIVE SEED AND SENTINEL LYMPH NODE MAPPING;  Surgeon: Vanessa Luna, MD;  Location: [facility];  Service: General;  Laterality: Right;  PEC BLOCK   CHOLECYSTECTOMY     lsc chole cystectomy and appendectomy 1 month after myomectomy   CYST  EXCISION Left 12/13/2019   Procedure: Excision left dorsal wrist ganglion cyst;  Surgeon: Vanessa Presume, MD;  Location: [facility];  Service: Plastics;  Laterality: Left;  45 min   ENDOVENOUS ABLATION SAPHENOUS VEIN W/ LASER Left 12/30/2019   endovenous laser ablation left greater saphenous vein by Vanessa Gallop MD    Lipoma removal Left    Left Lower Back   MYOMECTOMY     ROBOTIC ASSISTED SALPINGO OOPHERECTOMY Bilateral 05/23/2020   Procedure: XI ROBOTIC ASSISTED SALPINGO OOPHORECTOMY ;ENTEROLYSIS;  Surgeon: Vanessa Mosses, MD;  Location: [facility];  Service: Gynecology;  Laterality: Bilateral;   WISDOM TOOTH EXTRACTION      FAMILY HISTORY: Family History  Problem Relation Age of Onset   Heart failure Mother    Hyperlipidemia Mother    Hypertension Mother    Varicose Veins Mother    Diabetes Father    Heart disease Father    Stomach cancer Father 17   CAD Father    Hypertension Brother    Leukemia Paternal Aunt        dx. >50   Ovarian cancer Cousin        d. <50 (maternal first cousin)   Ovarian cancer Cousin        d. <50 (maternal first cousin)   Ovarian cancer Cousin        d. late 40s (maternal first cousin)   Breast cancer Niece 55   BRCA 1/2 Niece    Colon cancer Neg Hx    Pancreatic cancer Neg Hx    Prostate cancer Neg Hx    Her father died at age 64 after being diagnosed with stomach cancer. Her mother is age 69 (as of 08/2019). Vanessa Allen has 3 brothers and 2 sisters. She reports ovarian cancer in two maternal aunts and breast cancer in a niece (brother's daughter) at age 12.   GYNECOLOGIC HISTORY:  No LMP recorded. Patient is postmenopausal. Menarche: 56 years old GX P 0 LMP age [age] (early menopause) Contraceptive never used HRT used for <1 year  Hysterectomy? No, only myomectomy BSO? 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 She is single. She lives at  home by herelf.    ADVANCED DIRECTIVES: not in place. She intends to name one of her sisters, Vanessa Allen or Vanessa Allen as healthcare power of attorney.   HEALTH MAINTENANCE: Social History   Tobacco Use   Smoking status: Never   Smokeless tobacco: Never  Vaping Use   Vaping Use: Never used  Substance Use Topics   Alcohol use: Yes    Alcohol/week: 0.0 standard drinks of alcohol    Comment: rare    Drug use: Never     Colonoscopy: 11/2017, repeat due 2029  PAP: 06/2018, negative  Bone density: never done   No Known Allergies  Current Outpatient Medications  Medication Sig Dispense Refill   ACCU-CHEK GUIDE test strip USE TWICE DAILY TO CHECK BLOOD SUGAR BEFORE BREAKFAST AND DINNER 100 strip 5   acidophilus (RISAQUAD) CAPS capsule Take 1 capsule by mouth daily.     ALPRAZolam (XANAX) 0.5 MG tablet TAKE 1 TABLET(0.5 MG) BY MOUTH THREE TIMES DAILY  AS NEEDED FOR ANXIETY 30 tablet 0   aspirin EC 81 MG tablet Take 81 mg by mouth daily. Swallow whole.     atorvastatin (LIPITOR) 10 MG tablet TAKE 1 TABLET(10 MG) BY MOUTH EVERY 7 DAYS 12 tablet 0   Biotin w/ Vitamins C & E (HAIR/SKIN/NAILS PO) Take 1 tablet by mouth daily.     Coenzyme Q10 (CO Q-10) 100 MG CAPS Take by mouth.     Continuous Blood Gluc Receiver (FREESTYLE LIBRE 14 DAY READER) DEVI USE TO CHECK BLOOD SUGAR BEFORE MEALS AND AT BEDTIME 1 each 2   Continuous Blood Gluc Sensor (FREESTYLE LIBRE 14 DAY SENSOR) MISC USE TO MEASURE BLOOD GLUCOSE FOUR TIMES DAILY BEFORE MEALS AND AT BEDTIME 2 each 11   esomeprazole (NEXIUM) 20 MG capsule TAKE 1 CAPSULE(20 MG) BY MOUTH DAILY 90 capsule 1   gabapentin (NEURONTIN) 300 MG capsule Take 1 capsule (300 mg total) by mouth at bedtime. 90 capsule 4   Glucose Blood (GLUCOSE METER TEST VI) by In Vitro route 2 (two) times daily.     metFORMIN (GLUCOPHAGE) 500 MG tablet TAKE 1 TABLET(500 MG) BY MOUTH EVERY DAY WITH BREAKFAST 90 tablet 1   Multiple Vitamin (MULTIVITAMIN WITH MINERALS) TABS tablet Take  1 tablet by mouth daily.     Omega-3 1000 MG CAPS Take 1,000 mg by mouth daily.     Semaglutide (RYBELSUS) 14 MG TABS Take 1 tablet (14 mg total) by mouth daily before breakfast. 90 tablet 1   Suvorexant (BELSOMRA) 10 MG TABS Take 1 tablet by mouth at bedtime as needed. 90 tablet 1   tamoxifen (NOLVADEX) 20 MG tablet TAKE 1 TABLET(20 MG) BY MOUTH DAILY 90 tablet 2   Turmeric (QC TUMERIC COMPLEX PO) Take 1,000 mg by mouth daily.     venlafaxine XR (EFFEXOR-XR) 75 MG 24 hr capsule Take 1 capsule (75 mg total) by mouth daily with breakfast. 90 capsule 0   No current facility-administered medications for this visit.    OBJECTIVE: African-American woman who appears younger than stated age  30:   08/06/21 1152  BP: 126/88  Pulse: 80  Resp: 17  Temp: (!) 97.5 F (36.4 C)  SpO2: 100%      Body mass index is 27.19 kg/m.   Wt Readings from Last 3 Encounters:  08/06/21 168 lb 7 oz (76.4 kg)  08/06/21 168 lb 3.2 oz (76.3 kg)  05/01/21 164 lb (74.4 kg)     ECOG FS:2 - Symptomatic, <50% confined to bed  Physical Exam Constitutional:      Appearance: Normal appearance.  Chest:     Comments: Bilateral breast examined.  No palpable masses or regional adenopathy. Musculoskeletal:        General: No swelling.     Cervical back: Normal range of motion and neck supple. No rigidity.  Lymphadenopathy:     Cervical: No cervical adenopathy.  Neurological:     Mental Status: She is alert.      LAB RESULTS:  CMP     Component Value Date/Time   NA 144 05/01/2021 1547   K 3.9 05/01/2021 1547   CL 106 05/01/2021 1547   CO2 19 (L) 05/01/2021 1547   GLUCOSE 118 (H) 05/01/2021 1547   GLUCOSE 127 (H) 12/28/2020 1344   BUN 13 05/01/2021 1547   CREATININE 0.89 05/01/2021 1547   CREATININE 0.86 12/28/2020 1344   CALCIUM 9.8 05/01/2021 1547   PROT 7.6 05/01/2021 1547   ALBUMIN 5.0 (H) 05/01/2021 1547  AST 28 05/01/2021 1547   AST 14 (L) 12/28/2020 1344   ALT 33 (H) 05/01/2021 1547    ALT 21 12/28/2020 1344   ALKPHOS 76 05/01/2021 1547   BILITOT 0.4 05/01/2021 1547   BILITOT 0.6 12/28/2020 1344   GFRNONAA >60 12/28/2020 1344   GFRAA 75 02/28/2020 1049   GFRAA >60 09/01/2019 1210    No results found for: "TOTALPROTELP", "ALBUMINELP", "A1GS", "A2GS", "BETS", "BETA2SER", "GAMS", "MSPIKE", "SPEI"  Lab Results  Component Value Date   WBC 3.8 (L) 12/28/2020   NEUTROABS 2.0 12/28/2020   HGB 12.8 12/28/2020   HCT 36.8 12/28/2020   MCV 80.0 12/28/2020   PLT 256 12/28/2020    No results found for: "LABCA2"  No components found for: "XNATFT732"  No results for input(s): "INR" in the last 168 hours.  No results found for: "LABCA2"  No results found for: "CAN199"  Lab Results  Component Value Date   CAN125 35.5 02/07/2020    No results found for: "KGU542"  No results found for: "CA2729"  No components found for: "HGQUANT"  No results found for: "CEA1", "CEA" / No results found for: "CEA1", "CEA"   No results found for: "AFPTUMOR"  No results found for: "CHROMOGRNA"  No results found for: "KPAFRELGTCHN", "LAMBDASER", "KAPLAMBRATIO" (kappa/lambda light chains)  No results found for: "HGBA", "HGBA2QUANT", "HGBFQUANT", "HGBSQUAN" (Hemoglobinopathy evaluation)   No results found for: "LDH"  No results found for: "IRON", "TIBC", "IRONPCTSAT" (Iron and TIBC)  No results found for: "FERRITIN"  Urinalysis    Component Value Date/Time   COLORURINE YELLOW 04/21/2020 1318   APPEARANCEUR CLEAR 04/21/2020 1318   LABSPEC 1.019 04/21/2020 1318   PHURINE 6.0 04/21/2020 1318   GLUCOSEU NEGATIVE 04/21/2020 1318   HGBUR NEGATIVE 04/21/2020 1318   BILIRUBINUR NEGATIVE 04/21/2020 1318   BILIRUBINUR negative 07/20/2018 1108   KETONESUR NEGATIVE 04/21/2020 1318   PROTEINUR NEGATIVE 04/21/2020 1318   UROBILINOGEN 0.2 07/20/2018 1108   NITRITE NEGATIVE 04/21/2020 1318   LEUKOCYTESUR SMALL (A) 04/21/2020 1318    STUDIES: No results found.   ELIGIBLE FOR  AVAILABLE RESEARCH PROTOCOL: AET  ASSESSMENT: 56 y.o. [place] woman status post right breast biopsy 08/25/2019 for ductal carcinoma in situ, grade 2 or 3, estrogen and progesterone receptor positive  (1) genetics testing 09/09/2019 found a heterozygous pathogenic variant  in the BRCA2 gene called c.[id number] (p.[id number])  (a) no additional deleterious mutations were noted through the [facility] Breast [facility] STAT panel in ATM, BRCA1, BRCA2, CDH1, CHEK2, PALB2, PTEN, STK11 and TP53.  (2) right lumpectomy and sentinel lymph node sampling 09/23/2019 showed ductal carcinoma in situ, largest section 0.5 cm, with negative margins: pTis pN0, stage 0  (a) a total of 2 axillary lymph nodes were removed  (3) adjuvant radiation completed 12/08/2019 (50.4 Gray to the right breast plus boost)  (4) tamoxifen started 12/08/2019  (5) bilateral salpingo-oophorectomy pendingS/p bilateral salpingo-oophorectomy 05/23/2020 with benign pathoogy  (6) intensified screening with mammography in February and breast MRI in August.   PLAN:  Patient is here for follow-up given her history of DCIS, currently on tamoxifen as well as because of the BRCA2 mutation. No concerning breast changes today.  She is due for an MRI which is to be done in August 2023.  Phone number given to her to call and schedule. No side effects from tamoxifen except for hot flashes which are controlled with venlafaxine. She is up-to-date with her colon cancer screening.  No family history of pancreatic cancer reported.  She was recommended to  schedule with a dermatologist annually to follow-up on any skin changes and also because there is a small risk of oatmeal melanoma with BRCA2 She expressed understanding of the recommendations.  She should return to clinic in 6 months or sooner as needed.  Total time spent: 30 minutes  *Total Encounter Time as defined by the Centers for Medicare and Medicaid Services includes, in addition to the  face-to-face time of a patient visit (documented in the note above) non-face-to-face time: obtaining and reviewing outside history, ordering and reviewing medications, tests or procedures, care coordination (communications with other health care professionals or caregivers) and documentation in the medical record.

## 2021-08-06 NOTE — Progress Notes (Signed)
I,Vanessa Allen,acting as a Education administrator for [employer], FNP.,have documented all relevant documentation on the behalf of Vanessa Brine, FNP,as directed by  Vanessa Brine, FNP while in the presence of Vanessa Allen, Vanessa Allen.  Subjective:     Patient ID: Vanessa Allen , female    DOB: 06-03-1965 , 56 y.o.   MRN: [id number]   Chief Complaint  Patient presents with   Annual Exam    HPI  Patient here for hm. Patient has no other issues. She will be seeing a new Oncologist due to Dr. Jana Hakim retiring. She is due to see the Neurologist in Aug/Sept.      Past Medical History:  Diagnosis Date   Arthritis    knees and back    Atypical chest pain 07/20/2018   BRCA2 gene mutation positive in female    Cancer [facility])    Right Breast Cancer   Chest discomfort 05/06/8117   Complication of anesthesia    Diabetes mellitus without complication ([place])    type 2    Endometriosis    Family history of adverse reaction to anesthesia    brother has issues with n/v after surgery    Family history of BRCA gene mutation    Family history of breast cancer    Family history of leukemia    Family history of ovarian cancer    Family history of stomach cancer    Fibroid uterus    GERD (gastroesophageal reflux disease)    Personal history of radiation therapy    PONV (postoperative nausea and vomiting)    Pure hypercholesterolemia 04/30/2021   Tachycardia 11/13/2020   Venous insufficiency of lower extremity      Family History  Problem Relation Age of Onset   Heart failure Mother    Hyperlipidemia Mother    Hypertension Mother    Varicose Veins Mother    Diabetes Father    Heart disease Father    Stomach cancer Father 22   CAD Father    Hypertension Brother    Leukemia Paternal Aunt        dx. >50   Ovarian cancer Cousin        d. <50 (maternal first cousin)   Ovarian cancer Cousin        d. <50 (maternal first cousin)   Ovarian cancer Cousin        d. late 59s (maternal first cousin)   Breast  cancer Niece 67   BRCA 1/2 Niece    Colon cancer Neg Hx    Pancreatic cancer Neg Hx    Prostate cancer Neg Hx      Current Outpatient Medications:    ACCU-CHEK GUIDE test strip, USE TWICE DAILY TO CHECK BLOOD SUGAR BEFORE BREAKFAST AND DINNER, Disp: 100 strip, Rfl: 5   acidophilus (RISAQUAD) CAPS capsule, Take 1 capsule by mouth daily., Disp: , Rfl:    aspirin EC 81 MG tablet, Take 81 mg by mouth daily. Swallow whole., Disp: , Rfl:    atorvastatin (LIPITOR) 10 MG tablet, TAKE 1 TABLET(10 MG) BY MOUTH EVERY 7 DAYS, Disp: 12 tablet, Rfl: 0   Biotin w/ Vitamins C & E (HAIR/SKIN/NAILS PO), Take 1 tablet by mouth daily., Disp: , Rfl:    Coenzyme Q10 (CO Q-10) 100 MG CAPS, Take by mouth., Disp: , Rfl:    Continuous Blood Gluc Receiver (FREESTYLE LIBRE 14 DAY READER) DEVI, USE TO CHECK BLOOD SUGAR BEFORE MEALS AND AT BEDTIME, Disp: 1 each, Rfl: 2   Continuous Blood Gluc Sensor (  FREESTYLE LIBRE 14 DAY SENSOR) MISC, USE TO MEASURE BLOOD GLUCOSE FOUR TIMES DAILY BEFORE MEALS AND AT BEDTIME, Disp: 2 each, Rfl: 11   esomeprazole (NEXIUM) 20 MG capsule, TAKE 1 CAPSULE(20 MG) BY MOUTH DAILY, Disp: 90 capsule, Rfl: 1   gabapentin (NEURONTIN) 300 MG capsule, Take 1 capsule (300 mg total) by mouth at bedtime., Disp: 90 capsule, Rfl: 4   Glucose Blood (GLUCOSE METER TEST VI), by In Vitro route 2 (two) times daily., Disp: , Rfl:    metFORMIN (GLUCOPHAGE) 500 MG tablet, TAKE 1 TABLET(500 MG) BY MOUTH EVERY DAY WITH BREAKFAST, Disp: 90 tablet, Rfl: 1   Multiple Vitamin (MULTIVITAMIN WITH MINERALS) TABS tablet, Take 1 tablet by mouth daily., Disp: , Rfl:    Omega-3 1000 MG CAPS, Take 1,000 mg by mouth daily., Disp: , Rfl:    Semaglutide (RYBELSUS) 14 MG TABS, Take 1 tablet (14 mg total) by mouth daily before breakfast., Disp: 90 tablet, Rfl: 1   Suvorexant (BELSOMRA) 10 MG TABS, Take 1 tablet by mouth at bedtime as needed., Disp: 90 tablet, Rfl: 1   tamoxifen (NOLVADEX) 20 MG tablet, TAKE 1 TABLET(20 MG) BY MOUTH  DAILY, Disp: 90 tablet, Rfl: 2   Turmeric (QC TUMERIC COMPLEX PO), Take 1,000 mg by mouth daily., Disp: , Rfl:    venlafaxine XR (EFFEXOR-XR) 75 MG 24 hr capsule, Take 1 capsule (75 mg total) by mouth daily with breakfast., Disp: 90 capsule, Rfl: 0   ALPRAZolam (XANAX) 0.5 MG tablet, TAKE 1 TABLET(0.5 MG) BY MOUTH THREE TIMES DAILY AS NEEDED FOR ANXIETY, Disp: 30 tablet, Rfl: 0   No Known Allergies    The patient states she uses post menopausal status.   No LMP recorded. Patient is postmenopausal.. Negative for Dysmenorrhea and Negative for Menorrhagia. Negative for: breast discharge, breast lump(s), breast pain and breast self exam. Associated symptoms include abnormal vaginal bleeding. Pertinent negatives include abnormal bleeding (hematology), anxiety, decreased libido, depression, difficulty falling sleep, dyspareunia, history of infertility, nocturia, sexual dysfunction, sleep disturbances, urinary incontinence, urinary urgency, vaginal discharge and vaginal itching. Diet regular. Limits eating out.  The patient states her exercise level is minimal she walks at work - has 20,000 steps per day at work.   The patient's tobacco use is:  Social History   Tobacco Use  Smoking Status Never  Smokeless Tobacco Never   She has been exposed to passive smoke. The patient's alcohol use is:  Social History   Substance and Sexual Activity  Alcohol Use Yes   Alcohol/week: 0.0 standard drinks of alcohol   Comment: rare    Additional information: Last pap 06/2018, next one scheduled for 06/2021.    Review of Systems  Constitutional: Negative.   HENT: Negative.    Eyes: Negative.   Respiratory: Negative.    Cardiovascular: Negative.   Gastrointestinal: Negative.   Endocrine: Negative.   Genitourinary: Negative.   Musculoskeletal: Negative.   Skin: Negative.   Allergic/Immunologic: Negative.   Neurological: Negative.   Hematological: Negative.   Psychiatric/Behavioral: Negative.        Today's Vitals   08/06/21 1000  BP: 120/78  Pulse: 78  Temp: 98 F (36.7 C)  TempSrc: Oral  Weight: 168 lb 3.2 oz (76.3 kg)  Height: '5\' 6"'  (1.676 m)  PainSc: 0-No pain   Body mass index is 27.15 kg/m.  Wt Readings from Last 3 Encounters:  08/06/21 168 lb 7 oz (76.4 kg)  08/06/21 168 lb 3.2 oz (76.3 kg)  05/01/21 164 lb (74.4 kg)  Objective:  Physical Exam Vitals reviewed.  Constitutional:      General: She is not in acute distress.    Appearance: Normal appearance. She is well-developed. She is obese.  HENT:     Head: Normocephalic and atraumatic.     Right Ear: Hearing, tympanic membrane, ear canal and external ear normal. There is no impacted cerumen.     Left Ear: Hearing, tympanic membrane, ear canal and external ear normal. There is no impacted cerumen.     Nose:     Comments: Deferred - masked    Mouth/Throat:     Comments: Deferred - masked Eyes:     General: Lids are normal.     Extraocular Movements: Extraocular movements intact.     Conjunctiva/sclera: Conjunctivae normal.     Pupils: Pupils are equal, round, and reactive to light.     Funduscopic exam:    Right eye: No papilledema.        Left eye: No papilledema.  Neck:     Thyroid: No thyroid mass.     Vascular: No carotid bruit.  Cardiovascular:     Rate and Rhythm: Normal rate and regular rhythm.     Pulses: Normal pulses.     Heart sounds: Normal heart sounds. No murmur heard. Pulmonary:     Effort: Pulmonary effort is normal.     Breath sounds: Normal breath sounds.  Chest:     Chest wall: No mass.  Breasts:    Tanner Score is 5.     Right: Normal. No mass or tenderness.     Left: Normal. No mass or tenderness.  Abdominal:     General: Abdomen is flat. Bowel sounds are normal. There is no distension.     Palpations: Abdomen is soft.     Tenderness: There is no abdominal tenderness.  Genitourinary:    Rectum: Guaiac result negative.  Musculoskeletal:        General: No swelling.  Normal range of motion.     Cervical back: Full passive range of motion without pain, normal range of motion and neck supple.     Right lower leg: No edema.     Left lower leg: No edema.  Lymphadenopathy:     Upper Body:     Right upper body: No supraclavicular, axillary or pectoral adenopathy.     Left upper body: No supraclavicular, axillary or pectoral adenopathy.  Skin:    General: Skin is warm and dry.     Capillary Refill: Capillary refill takes less than 2 seconds.  Neurological:     General: No focal deficit present.     Mental Status: She is alert and oriented to person, place, and time.     Cranial Nerves: No cranial nerve deficit.     Sensory: No sensory deficit.  Psychiatric:        Mood and Affect: Mood normal.        Behavior: Behavior normal.        Thought Content: Thought content normal.        Judgment: Judgment normal.         Assessment And Plan:     1. Encounter for general adult medical examination w/o abnormal findings Behavior modifications discussed and diet history reviewed.   Pt will continue to exercise regularly and modify diet with low GI, plant based foods and decrease intake of processed foods.  Recommend intake of daily multivitamin, Vitamin D, and calcium.  Recommend mammogram and colonoscopy for preventive screenings,  as well as recommend immunizations that include influenza, TDAP, and Shingles (1st dose done in June)  2. Type 2 diabetes mellitus with diabetic peripheral angiopathy without gangrene, without long-term current use of insulin (HCC) Comments: HgbA1c is stable, tolerating medications well.  - POCT Urinalysis Dipstick (81002) - EKG 12-Lead - Hemoglobin A1c - CMP14+EGFR - Lipid panel - Urine microalbumin-creatinine with uACR  3. Anxiety Comments: Stable, continues to intermittently take antianxiety medications (Xanax) - ALPRAZolam (XANAX) 0.5 MG tablet; TAKE 1 TABLET(0.5 MG) BY MOUTH THREE TIMES DAILY AS NEEDED FOR ANXIETY   Dispense: 30 tablet; Refill: 0  4. Atherosclerosis of aorta (HCC) Comments: Continue statin tolerating well  5. Other insomnia Comments: Stable, continue current medications  6. Ductal carcinoma in situ (DCIS) of right breast Comments: Continue follow up with Oncology - CBC with Differential/Platelet  7. Encounter for Papanicolaou smear of cervix Comments: PAP done, no abnormal physical findings. - Cytology -Pap Smear  8. Other long term (current) drug therapy    Patient was given opportunity to ask questions. Patient verbalized understanding of the plan and was able to repeat key elements of the plan. All questions were answered to their satisfaction.   Vanessa Brine, FNP   I, Vanessa Brine, FNP, have reviewed all documentation for this visit. The documentation on 08/06/21 for the exam, diagnosis, procedures, and orders are all accurate and complete.   THE PATIENT IS ENCOURAGED TO PRACTICE SOCIAL DISTANCING DUE TO THE COVID-19 PANDEMIC.

## 2021-08-07 ENCOUNTER — Ambulatory Visit: Payer: BC Managed Care – PPO | Admitting: Rehabilitation

## 2021-08-07 LAB — MICROALBUMIN / CREATININE URINE RATIO
Creatinine, Urine: 132.5 mg/dL
Microalb/Creat Ratio: 7 mg/g creat (ref 0–29)
Microalbumin, Urine: 9 ug/mL

## 2021-08-08 ENCOUNTER — Encounter: Payer: Self-pay | Admitting: Hematology and Oncology

## 2021-08-09 LAB — CYTOLOGY - PAP: Diagnosis: NEGATIVE

## 2021-08-15 ENCOUNTER — Ambulatory Visit
Admission: RE | Admit: 2021-08-15 | Discharge: 2021-08-15 | Disposition: A | Discharge status: Home / Self Care | Payer: BC Managed Care – PPO | Source: Ambulatory Visit | Attending: Hematology and Oncology | Admitting: Hematology and Oncology

## 2021-08-15 DIAGNOSIS — Z9889 Other specified postprocedural states: Secondary | ICD-10-CM | POA: Diagnosis not present

## 2021-08-15 DIAGNOSIS — Z803 Family history of malignant neoplasm of breast: Secondary | ICD-10-CM

## 2021-08-15 DIAGNOSIS — K769 Liver disease, unspecified: Secondary | ICD-10-CM | POA: Diagnosis not present

## 2021-08-15 DIAGNOSIS — Z1501 Genetic susceptibility to malignant neoplasm of breast: Secondary | ICD-10-CM

## 2021-08-15 DIAGNOSIS — Z853 Personal history of malignant neoplasm of breast: Secondary | ICD-10-CM | POA: Diagnosis not present

## 2021-08-15 DIAGNOSIS — D0511 Intraductal carcinoma in situ of right breast: Secondary | ICD-10-CM

## 2021-08-15 DIAGNOSIS — C50911 Malignant neoplasm of unspecified site of right female breast: Secondary | ICD-10-CM

## 2021-08-15 DIAGNOSIS — Z8481 Family history of carrier of genetic disease: Secondary | ICD-10-CM

## 2021-08-15 MED ORDER — GADOBUTROL 1 MMOL/ML IV SOLN
7.0000 mL | Freq: Once | INTRAVENOUS | Status: AC | PRN
  Administered 2021-08-15: 7 mL via INTRAVENOUS

## 2021-08-29 ENCOUNTER — Ambulatory Visit: Payer: BC Managed Care – PPO | Admitting: Family Medicine

## 2021-08-31 ENCOUNTER — Encounter: Payer: Self-pay | Admitting: Nurse Practitioner

## 2021-09-21 ENCOUNTER — Ambulatory Visit: Payer: BC Managed Care – PPO | Admitting: Hematology and Oncology

## 2021-09-21 ENCOUNTER — Other Ambulatory Visit: Payer: BC Managed Care – PPO

## 2021-10-12 ENCOUNTER — Other Ambulatory Visit: Payer: Self-pay | Admitting: Hematology and Oncology

## 2021-10-12 ENCOUNTER — Telehealth: Payer: Self-pay | Admitting: *Deleted

## 2021-10-12 ENCOUNTER — Encounter: Payer: Self-pay | Admitting: Hematology and Oncology

## 2021-10-12 ENCOUNTER — Other Ambulatory Visit: Payer: Self-pay | Admitting: *Deleted

## 2021-10-12 MED ORDER — TAMOXIFEN CITRATE 20 MG PO TABS: ORAL_TABLET | ORAL | 2 refills | Status: DC

## 2021-10-12 MED ORDER — VENLAFAXINE HCL ER 75 MG PO CP24: 75.0000 mg | ORAL_CAPSULE | Freq: Every day | ORAL | 0 refills | Status: DC

## 2021-10-12 NOTE — Telephone Encounter (Signed)
Refills sent

## 2021-10-13 ENCOUNTER — Other Ambulatory Visit: Payer: Self-pay | Admitting: Hematology and Oncology

## 2021-10-13 MED ORDER — TAMOXIFEN CITRATE 20 MG PO TABS: ORAL_TABLET | ORAL | 2 refills | Status: DC

## 2021-10-13 NOTE — Progress Notes (Signed)
Tamoxifen refilled.

## 2021-10-17 ENCOUNTER — Encounter: Payer: Self-pay | Admitting: Nurse Practitioner

## 2021-10-18 ENCOUNTER — Other Ambulatory Visit: Payer: Self-pay

## 2021-10-18 MED ORDER — METFORMIN HCL 500 MG PO TABS: ORAL_TABLET | ORAL | 1 refills | Status: DC

## 2021-10-24 ENCOUNTER — Other Ambulatory Visit: Payer: Self-pay

## 2021-10-24 DIAGNOSIS — E119 Type 2 diabetes mellitus without complications: Secondary | ICD-10-CM

## 2021-10-24 MED ORDER — FREESTYLE LIBRE 14 DAY READER DEVI: 2 refills | Status: AC

## 2021-10-24 MED ORDER — FREESTYLE LIBRE 14 DAY SENSOR MISC: 11 refills | Status: DC

## 2021-11-12 ENCOUNTER — Ambulatory Visit: Payer: BC Managed Care – PPO | Admitting: Nurse Practitioner

## 2021-11-14 ENCOUNTER — Ambulatory Visit: Payer: BC Managed Care – PPO | Admitting: Nurse Practitioner

## 2022-01-07 ENCOUNTER — Encounter: Payer: Self-pay | Admitting: Nurse Practitioner

## 2022-01-09 DIAGNOSIS — L821 Other seborrheic keratosis: Secondary | ICD-10-CM | POA: Diagnosis not present

## 2022-01-09 DIAGNOSIS — L814 Other melanin hyperpigmentation: Secondary | ICD-10-CM | POA: Diagnosis not present

## 2022-01-09 DIAGNOSIS — D225 Melanocytic nevi of trunk: Secondary | ICD-10-CM | POA: Diagnosis not present

## 2022-02-04 NOTE — Progress Notes (Unsigned)
[place]  [phone] [phone]     ID: Vanessa Allen DOB: 1965/09/22  MR#: [id number]  ZTI#:[id number]  Patient Care Team: [name], Rogers as PCP - General (General Practice) Erroll Luna, MD as Consulting Physician (General Surgery) Kyung Rudd, MD as Consulting Physician (Radiation Oncology) Mauro Kaufmann, RN as Oncology Nurse Navigator Rockwell Germany, RN as Oncology Nurse Navigator Lafonda Mosses, MD as Consulting Physician (Gynecologic Oncology) Benay Pike, MD as Consulting Physician (Hematology and Oncology) Benay Pike, MD  CHIEF COMPLAINT:  Ductal carcinoma in situ; BRCA2+  CURRENT TREATMENT: tamoxifen  INTERVAL HISTORY:  Vanessa Allen returns today for follow up of her noninvasive breast cancer. She started tamoxifen on 12/08/2019.  She is generally tolerating this well.   She is also taking venlafaxine 75 mg daily and this has been helping her hot flashes.  She denies any other new complaints today. She underwent BSO because of her BRCA2 mutation.  She follows up with mammogram alternating with MRI for breast cancer screening. She is up-to-date with her colon cancer screening.  She does not follow-up with dermatology regularly. Rest of the pertinent 10 point ROS reviewed and negative  COVID 19 VACCINATION STATUS: [place] x2, status post booster   HISTORY OF CURRENT ILLNESS: From the original intake note:  Vanessa Allen had routine screening mammography on 07/23/2019 showing a possible abnormality in the right breast. She underwent right diagnostic mammography with tomography at [facility] on 08/19/2019 showing: breast density category C; indeterminate 1.9 cm medial right breast calcifications.  Accordingly on 08/25/2019 she proceeded to biopsy of the right breast area in question. The pathology from this procedure ([id number]) showed: ductal carcinoma involving a papillary lesion. There is at least  intermediate grade ductal carcinoma in situ, however, the lesion is fragmented hampering assessment and invasion can not be excluded. Lymph node sampling at the time of excision is recommended. Prognostic indicators significant for: estrogen receptor, 90% positive and progesterone receptor, 90% positive, both with strong staining intensity.  Right axilla ultrasound was performed on 08/31/2019 showing no abnormal-appearing lymph nodes.   The patient's subsequent history is as detailed below.   PAST MEDICAL HISTORY: Past Medical History:  Diagnosis Date   Arthritis    knees and back    Atypical chest pain 07/20/2018   BRCA2 gene mutation positive in female    Cancer [facility])    Right Breast Cancer   Chest discomfort 7/34/1937   Complication of anesthesia    Diabetes mellitus without complication ([place])    type 2    Endometriosis    Family history of adverse reaction to anesthesia    brother has issues with n/v after surgery    Family history of BRCA gene mutation    Family history of breast cancer    Family history of leukemia    Family history of ovarian cancer    Family history of stomach cancer    Fibroid uterus    GERD (gastroesophageal reflux disease)    Personal history of radiation therapy    PONV (postoperative nausea and vomiting)    Pure hypercholesterolemia 04/30/2021   Tachycardia 11/13/2020   Venous insufficiency of lower extremity     PAST SURGICAL HISTORY: Past Surgical History:  Procedure Laterality Date   APPENDECTOMY     BREAST BIOPSY Right 08/25/2019   BREAST LUMPECTOMY Right 09/23/2019   BREAST LUMPECTOMY WITH RADIOACTIVE SEED AND SENTINEL LYMPH NODE BIOPSY Right 09/23/2019   Procedure: RIGHT BREAST LUMPECTOMY WITH RADIOACTIVE  SEED AND SENTINEL LYMPH NODE MAPPING;  Surgeon: Erroll Luna, MD;  Location: [facility];  Service: General;  Laterality: Right;  PEC BLOCK   CHOLECYSTECTOMY     lsc chole cystectomy and appendectomy 1 month after myomectomy   CYST EXCISION  Left 12/13/2019   Procedure: Excision left dorsal wrist ganglion cyst;  Surgeon: Cindra Presume, MD;  Location: [facility];  Service: Plastics;  Laterality: Left;  45 min   ENDOVENOUS ABLATION SAPHENOUS VEIN W/ LASER Left 12/30/2019   endovenous laser ablation left greater saphenous vein by Gae Gallop MD    Lipoma removal Left    Left Lower Back   MYOMECTOMY     ROBOTIC ASSISTED SALPINGO OOPHERECTOMY Bilateral 05/23/2020   Procedure: XI ROBOTIC ASSISTED SALPINGO OOPHORECTOMY ;ENTEROLYSIS;  Surgeon: Lafonda Mosses, MD;  Location: [facility];  Service: Gynecology;  Laterality: Bilateral;   WISDOM TOOTH EXTRACTION      FAMILY HISTORY: Family History  Problem Relation Age of Onset   Heart failure Mother    Hyperlipidemia Mother    Hypertension Mother    Varicose Veins Mother    Diabetes Father    Heart disease Father    Stomach cancer Father 64   CAD Father    Hypertension Brother    Leukemia Paternal Aunt        dx. >50   Ovarian cancer Cousin        d. <50 (maternal first cousin)   Ovarian cancer Cousin        d. <50 (maternal first cousin)   Ovarian cancer Cousin        d. late 77s (maternal first cousin)   Breast cancer Niece 96   BRCA 1/2 Niece    Colon cancer Neg Hx    Pancreatic cancer Neg Hx    Prostate cancer Neg Hx    Her father died at age 74 after being diagnosed with stomach cancer. Her mother is age 48 (as of 08/2019). Vanessa Allen has 3 brothers and 2 sisters. She reports ovarian cancer in two maternal aunts and breast cancer in a niece (brother's daughter) at age 26.   GYNECOLOGIC HISTORY:  No LMP recorded. Patient is postmenopausal. Menarche: 57 years old GX P 0 LMP age [age] (early menopause) Contraceptive never used HRT used for <1 year  Hysterectomy? No, only myomectomy BSO? 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 She is single. She lives at home by  herelf.    ADVANCED DIRECTIVES: not in place. She intends to name one of her sisters, Vanessa Allen or Vanessa Allen as healthcare power of attorney.   HEALTH MAINTENANCE: Social History   Tobacco Use   Smoking status: Never   Smokeless tobacco: Never  Vaping Use   Vaping Use: Never used  Substance Use Topics   Alcohol use: Yes    Alcohol/week: 0.0 standard drinks of alcohol    Comment: rare    Drug use: Never     Colonoscopy: 11/2017, repeat due 2029  PAP: 06/2018, negative  Bone density: never done   No Known Allergies  Current Outpatient Medications  Medication Sig Dispense Refill   ACCU-CHEK GUIDE test strip USE TWICE DAILY TO CHECK BLOOD SUGAR BEFORE BREAKFAST AND DINNER 100 strip 5   acidophilus (RISAQUAD) CAPS capsule Take 1 capsule by mouth daily.     ALPRAZolam (XANAX) 0.5 MG tablet TAKE 1 TABLET(0.5 MG) BY MOUTH THREE TIMES DAILY AS  NEEDED FOR ANXIETY 30 tablet 0   aspirin EC 81 MG tablet Take 81 mg by mouth daily. Swallow whole.     atorvastatin (LIPITOR) 10 MG tablet TAKE 1 TABLET(10 MG) BY MOUTH EVERY 7 DAYS 12 tablet 0   Biotin w/ Vitamins C & E (HAIR/SKIN/NAILS PO) Take 1 tablet by mouth daily.     Coenzyme Q10 (CO Q-10) 100 MG CAPS Take by mouth.     Continuous Blood Gluc Receiver (FREESTYLE LIBRE 14 DAY READER) DEVI USE TO CHECK BLOOD SUGAR BEFORE MEALS AND AT BEDTIME 1 each 2   Continuous Blood Gluc Sensor (FREESTYLE LIBRE 14 DAY SENSOR) MISC USE TO MEASURE BLOOD GLUCOSE FOUR TIMES DAILY BEFORE MEALS AND AT BEDTIME 2 each 11   esomeprazole (NEXIUM) 20 MG capsule TAKE 1 CAPSULE(20 MG) BY MOUTH DAILY 90 capsule 1   gabapentin (NEURONTIN) 300 MG capsule Take 1 capsule (300 mg total) by mouth at bedtime. 90 capsule 4   Glucose Blood (GLUCOSE METER TEST VI) by In Vitro route 2 (two) times daily.     metFORMIN (GLUCOPHAGE) 500 MG tablet TAKE 1 TABLET(500 MG) BY MOUTH EVERY DAY WITH BREAKFAST 90 tablet 1   Multiple Vitamin (MULTIVITAMIN WITH MINERALS) TABS tablet Take 1 tablet  by mouth daily.     Omega-3 1000 MG CAPS Take 1,000 mg by mouth daily.     Semaglutide (RYBELSUS) 14 MG TABS Take 1 tablet (14 mg total) by mouth daily before breakfast. 90 tablet 1   Suvorexant (BELSOMRA) 10 MG TABS Take 1 tablet by mouth at bedtime as needed. 90 tablet 1   tamoxifen (NOLVADEX) 20 MG tablet TAKE 1 TABLET(20 MG) BY MOUTH DAILY 90 tablet 2   Turmeric (QC TUMERIC COMPLEX PO) Take 1,000 mg by mouth daily.     venlafaxine XR (EFFEXOR-XR) 75 MG 24 hr capsule Take 1 capsule (75 mg total) by mouth daily with breakfast. 90 capsule 0   No current facility-administered medications for this visit.    OBJECTIVE: African-American woman who appears younger than stated age  There were no vitals filed for this visit.     There is no height or weight on file to calculate BMI.   Wt Readings from Last 3 Encounters:  08/06/21 168 lb 7 oz (76.4 kg)  08/06/21 168 lb 3.2 oz (76.3 kg)  05/01/21 164 lb (74.4 kg)     ECOG FS:2 - Symptomatic, <50% confined to bed  Physical Exam Constitutional:      Appearance: Normal appearance.  Chest:     Comments: Bilateral breast examined.  No palpable masses or regional adenopathy. Musculoskeletal:        General: No swelling.     Cervical back: Normal range of motion and neck supple. No rigidity.  Lymphadenopathy:     Cervical: No cervical adenopathy.  Neurological:     Mental Status: She is alert.      LAB RESULTS:  CMP     Component Value Date/Time   NA 142 08/06/2021 1116   K 4.2 08/06/2021 1116   CL 105 08/06/2021 1116   CO2 23 08/06/2021 1116   GLUCOSE 106 (H) 08/06/2021 1116   GLUCOSE 127 (H) 12/28/2020 1344   BUN 9 08/06/2021 1116   CREATININE 0.85 08/06/2021 1116   CREATININE 0.86 12/28/2020 1344   CALCIUM 9.4 08/06/2021 1116   PROT 7.0 08/06/2021 1116   ALBUMIN 4.5 08/06/2021 1116   AST 29 08/06/2021 1116   AST 14 (L) 12/28/2020 1344   ALT 38 (  H) 08/06/2021 1116   ALT 21 12/28/2020 1344   ALKPHOS 62 08/06/2021 1116    BILITOT 0.4 08/06/2021 1116   BILITOT 0.6 12/28/2020 1344   GFRNONAA >60 12/28/2020 1344   GFRAA 75 02/28/2020 1049   GFRAA >60 09/01/2019 1210    No results found for: "TOTALPROTELP", "ALBUMINELP", "A1GS", "A2GS", "BETS", "BETA2SER", "GAMS", "MSPIKE", "SPEI"  Lab Results  Component Value Date   WBC 3.6 08/06/2021   NEUTROABS 1.8 08/06/2021   HGB 13.3 08/06/2021   HCT 40.3 08/06/2021   MCV 81 08/06/2021   PLT 245 08/06/2021    No results found for: "LABCA2"  No components found for: "SNKNLZ767"  No results for input(s): "INR" in the last 168 hours.  No results found for: "LABCA2"  No results found for: "CAN199"  Lab Results  Component Value Date   CAN125 35.5 02/07/2020    No results found for: "HAL937"  No results found for: "CA2729"  No components found for: "HGQUANT"  No results found for: "CEA1", "CEA" / No results found for: "CEA1", "CEA"   No results found for: "AFPTUMOR"  No results found for: "CHROMOGRNA"  No results found for: "KPAFRELGTCHN", "LAMBDASER", "KAPLAMBRATIO" (kappa/lambda light chains)  No results found for: "HGBA", "HGBA2QUANT", "HGBFQUANT", "HGBSQUAN" (Hemoglobinopathy evaluation)   No results found for: "LDH"  No results found for: "IRON", "TIBC", "IRONPCTSAT" (Iron and TIBC)  No results found for: "FERRITIN"  Urinalysis    Component Value Date/Time   COLORURINE YELLOW 04/21/2020 1318   APPEARANCEUR CLEAR 04/21/2020 1318   LABSPEC 1.019 04/21/2020 1318   PHURINE 6.0 04/21/2020 1318   GLUCOSEU NEGATIVE 04/21/2020 1318   HGBUR NEGATIVE 04/21/2020 1318   BILIRUBINUR Negative 08/06/2021 1720   KETONESUR NEGATIVE 04/21/2020 1318   PROTEINUR Negative 08/06/2021 1720   PROTEINUR NEGATIVE 04/21/2020 1318   UROBILINOGEN 0.2 08/06/2021 1720   NITRITE Negative 08/06/2021 1720   NITRITE NEGATIVE 04/21/2020 1318   LEUKOCYTESUR Trace (A) 08/06/2021 1720   LEUKOCYTESUR SMALL (A) 04/21/2020 1318    STUDIES: No results  found.   ELIGIBLE FOR AVAILABLE RESEARCH PROTOCOL: AET  ASSESSMENT: 56 y.o. [place] woman status post right breast biopsy 08/25/2019 for ductal carcinoma in situ, grade 2 or 3, estrogen and progesterone receptor positive  (1) genetics testing 09/09/2019 found a heterozygous pathogenic variant  in the BRCA2 gene called c.[id number] (p.Ser1982Argfs*[id number])  (a) no additional deleterious mutations were noted through the [facility] Breast Cancer STAT panel in ATM, BRCA1, BRCA2, CDH1, CHEK2, PALB2, PTEN, STK11 and TP53.  (2) right lumpectomy and sentinel lymph node sampling 09/23/2019 showed ductal carcinoma in situ, largest section 0.5 cm, with negative margins: pTis pN0, stage 0  (a) a total of 2 axillary lymph nodes were removed  (3) adjuvant radiation completed 12/08/2019 (50.4 [place] to the right breast plus boost)  (4) tamoxifen started 12/08/2019  (5) bilateral salpingo-oophorectomy pendingS/p bilateral salpingo-oophorectomy 05/23/2020 with benign pathoogy  (6) intensified screening with mammography in February and breast MRI in August.   PLAN:  Patient is here for follow-up given her history of DCIS, currently on tamoxifen as well as because of the BRCA2 mutation. No concerning breast changes today.  She is due for an MRI which is to be done in August 2023.  Phone number given to her to call and schedule. Most recent MRI in July 2023 negative for malignancy. She is up-to-date with her colon cancer screening.  No family history of pancreatic cancer reported.  She was recommended to schedule with a dermatologist annually to follow-up on  any skin changes and also because there is a small risk of oatmeal melanoma with BRCA2 She expressed understanding of the recommendations.  She should return to clinic in 6 months or sooner as needed.  Total time spent: 30 minutes  *Total Encounter Time as defined by the Centers for Medicare and Medicaid Services includes, in addition to the face-to-face time  of a patient visit (documented in the note above) non-face-to-face time: obtaining and reviewing outside history, ordering and reviewing medications, tests or procedures, care coordination (communications with other health care professionals or caregivers) and documentation in the medical record.

## 2022-02-05 ENCOUNTER — Other Ambulatory Visit: Payer: Self-pay | Admitting: Nurse Practitioner

## 2022-02-05 ENCOUNTER — Inpatient Hospital Stay: Payer: BC Managed Care – PPO | Attending: Hematology and Oncology | Admitting: Hematology and Oncology

## 2022-02-05 ENCOUNTER — Ambulatory Visit (INDEPENDENT_AMBULATORY_CARE_PROVIDER_SITE_OTHER): Payer: BC Managed Care – PPO | Admitting: Nurse Practitioner

## 2022-02-05 ENCOUNTER — Encounter: Payer: Self-pay | Admitting: Nurse Practitioner

## 2022-02-05 ENCOUNTER — Encounter: Payer: Self-pay | Admitting: Hematology and Oncology

## 2022-02-05 VITALS — BP 118/82 | HR 82 | Temp 98.1°F | Ht 66.0 in | Wt 176.4 lb

## 2022-02-05 VITALS — BP 128/91 | HR 96 | Temp 97.8°F | Resp 18 | Ht 66.0 in | Wt 177.2 lb

## 2022-02-05 DIAGNOSIS — F419 Anxiety disorder, unspecified: Secondary | ICD-10-CM | POA: Diagnosis not present

## 2022-02-05 DIAGNOSIS — Z1509 Genetic susceptibility to other malignant neoplasm: Secondary | ICD-10-CM

## 2022-02-05 DIAGNOSIS — E1151 Type 2 diabetes mellitus with diabetic peripheral angiopathy without gangrene: Secondary | ICD-10-CM | POA: Diagnosis not present

## 2022-02-05 DIAGNOSIS — G4709 Other insomnia: Secondary | ICD-10-CM

## 2022-02-05 DIAGNOSIS — D0511 Intraductal carcinoma in situ of right breast: Secondary | ICD-10-CM | POA: Insufficient documentation

## 2022-02-05 DIAGNOSIS — Z1501 Genetic susceptibility to malignant neoplasm of breast: Secondary | ICD-10-CM | POA: Diagnosis not present

## 2022-02-05 DIAGNOSIS — Z923 Personal history of irradiation: Secondary | ICD-10-CM | POA: Insufficient documentation

## 2022-02-05 DIAGNOSIS — Z79899 Other long term (current) drug therapy: Secondary | ICD-10-CM | POA: Diagnosis not present

## 2022-02-05 DIAGNOSIS — G4700 Insomnia, unspecified: Secondary | ICD-10-CM

## 2022-02-05 DIAGNOSIS — Z23 Encounter for immunization: Secondary | ICD-10-CM | POA: Diagnosis not present

## 2022-02-05 DIAGNOSIS — C50911 Malignant neoplasm of unspecified site of right female breast: Secondary | ICD-10-CM

## 2022-02-05 DIAGNOSIS — I700 Atherosclerosis of aorta: Secondary | ICD-10-CM

## 2022-02-05 DIAGNOSIS — Z6828 Body mass index (BMI) 28.0-28.9, adult: Secondary | ICD-10-CM

## 2022-02-05 DIAGNOSIS — Z1502 Genetic susceptibility to malignant neoplasm of ovary: Secondary | ICD-10-CM

## 2022-02-05 MED ORDER — MOUNJARO 5 MG/0.5ML ~~LOC~~ SOAJ: 5.0000 mg | SUBCUTANEOUS | 0 refills | Status: DC

## 2022-02-05 MED ORDER — VENLAFAXINE HCL ER 75 MG PO CP24: 75.0000 mg | ORAL_CAPSULE | Freq: Every day | ORAL | 1 refills | Status: DC

## 2022-02-05 MED ORDER — RYBELSUS 14 MG PO TABS
1.0000 | ORAL_TABLET | Freq: Every day | ORAL | 1 refills | Status: DC
Start: 2022-02-05 — End: 2022-02-05

## 2022-02-05 MED ORDER — ALPRAZOLAM 0.5 MG PO TABS: ORAL_TABLET | ORAL | 0 refills | Status: DC

## 2022-02-05 NOTE — Patient Instructions (Signed)

## 2022-02-05 NOTE — Progress Notes (Signed)
I,Vanessa Allen,acting as a Education administrator for Vanessa Brine, FNP.,have documented all relevant documentation on the behalf of Vanessa Brine, FNP,as directed by  Vanessa Brine, FNP while in the presence of Vanessa Allen, [place].    Subjective:     Patient ID: Vanessa Allen , female    DOB: Jun 28, 1965 , 57 y.o.   MRN: [id number]   Chief Complaint  Patient presents with   Diabetes    HPI  Pt presents today for DM f/u, rescheduled appointment. She has no specific questions or concerns. Reports the more tired she is there more difficult it is for her to fall asleep.   She would like her Xanax & Nexium refilled.   Wt Readings from Last 3 Encounters: 02/05/22 : 176 lb 6.4 oz (80 kg) 08/06/21 : 168 lb 7 oz (76.4 kg) 08/06/21 : 168 lb 3.2 oz (76.3 kg)       Diabetes She presents for her follow-up diabetic visit. She has type 2 diabetes mellitus. There are no hypoglycemic associated symptoms. There are no diabetic associated symptoms. Pertinent negatives for diabetes include no chest pain, no polydipsia, no polyphagia and no polyuria. There are no hypoglycemic complications. Symptoms are stable. There are no diabetic complications. Risk factors for coronary artery disease include obesity and sedentary lifestyle. Current diabetic treatment includes oral agent (monotherapy). She is compliant with treatment all of the time. She is following a generally healthy diet. When asked about meal planning, she reported none. She has not had a previous visit with a dietitian. There is no change in her home blood glucose trend. (Her blood sugar has overall been doing okay. ) An ACE inhibitor/angiotensin II receptor blocker is not being taken.  Insomnia Primary symptoms: no sleep disturbance.   The onset quality is gradual. The problem occurs nightly. Past treatments include medication (belsomra - has not seen significant difference but not taking consistently). The treatment provided no relief. Prior workup: she  had a home sleep study was not approved for sleep study in person.     Past Medical History:  Diagnosis Date   Arthritis    knees and back    Atypical chest pain 07/20/2018   BRCA2 gene mutation positive in female    Cancer [facility])    Right Breast Cancer   Chest discomfort 9/73/5329   Complication of anesthesia    Diabetes mellitus without complication ([place])    type 2    Endometriosis    Family history of adverse reaction to anesthesia    brother has issues with n/v after surgery    Family history of BRCA gene mutation    Family history of breast cancer    Family history of leukemia    Family history of ovarian cancer    Family history of stomach cancer    Fibroid uterus    GERD (gastroesophageal reflux disease)    Personal history of radiation therapy    PONV (postoperative nausea and vomiting)    Pure hypercholesterolemia 04/30/2021   Tachycardia 11/13/2020   Venous insufficiency of lower extremity      Family History  Problem Relation Age of Onset   Heart failure Mother    Hyperlipidemia Mother    Hypertension Mother    Varicose Veins Mother    Diabetes Father    Heart disease Father    Stomach cancer Father 68   CAD Father    Hypertension Brother    Leukemia Paternal Aunt        dx. >50  Ovarian cancer Cousin        d. <50 (maternal first cousin)   Ovarian cancer Cousin        d. <50 (maternal first cousin)   Ovarian cancer Cousin        d. late 33s (maternal first cousin)   Breast cancer Niece 66   BRCA 1/2 Niece    Colon cancer Neg Hx    Pancreatic cancer Neg Hx    Prostate cancer Neg Hx      Current Outpatient Medications:    ACCU-CHEK GUIDE test strip, USE TWICE DAILY TO CHECK BLOOD SUGAR BEFORE BREAKFAST AND DINNER, Disp: 100 strip, Rfl: 5   acidophilus (RISAQUAD) CAPS capsule, Take 1 capsule by mouth daily., Disp: , Rfl:    aspirin EC 81 MG tablet, Take 81 mg by mouth daily. Swallow whole., Disp: , Rfl:    atorvastatin (LIPITOR) 10 MG tablet, TAKE  1 TABLET(10 MG) BY MOUTH EVERY 7 DAYS, Disp: 12 tablet, Rfl: 0   Biotin w/ Vitamins C & E (HAIR/SKIN/NAILS PO), Take 1 tablet by mouth daily., Disp: , Rfl:    Coenzyme Q10 (CO Q-10) 100 MG CAPS, Take by mouth., Disp: , Rfl:    Continuous Blood Gluc Receiver (FREESTYLE LIBRE 14 DAY READER) DEVI, USE TO CHECK BLOOD SUGAR BEFORE MEALS AND AT BEDTIME, Disp: 1 each, Rfl: 2   Continuous Blood Gluc Sensor (FREESTYLE LIBRE 14 DAY SENSOR) MISC, USE TO MEASURE BLOOD GLUCOSE FOUR TIMES DAILY BEFORE MEALS AND AT BEDTIME, Disp: 2 each, Rfl: 11   esomeprazole (NEXIUM) 20 MG capsule, TAKE 1 CAPSULE(20 MG) BY MOUTH DAILY, Disp: 90 capsule, Rfl: 1   gabapentin (NEURONTIN) 300 MG capsule, Take 1 capsule (300 mg total) by mouth at bedtime., Disp: 90 capsule, Rfl: 4   Glucose Blood (GLUCOSE METER TEST VI), by In Vitro route 2 (two) times daily., Disp: , Rfl:    metFORMIN (GLUCOPHAGE) 500 MG tablet, TAKE 1 TABLET(500 MG) BY MOUTH EVERY DAY WITH BREAKFAST, Disp: 90 tablet, Rfl: 1   Multiple Vitamin (MULTIVITAMIN WITH MINERALS) TABS tablet, Take 1 tablet by mouth daily., Disp: , Rfl:    Omega-3 1000 MG CAPS, Take 1,000 mg by mouth daily., Disp: , Rfl:    tamoxifen (NOLVADEX) 20 MG tablet, TAKE 1 TABLET(20 MG) BY MOUTH DAILY, Disp: 90 tablet, Rfl: 2   tirzepatide (MOUNJARO) 5 MG/0.5ML Pen, Inject 5 mg into the skin once a week., Disp: 2 mL, Rfl: 0   Turmeric (QC TUMERIC COMPLEX PO), Take 1,000 mg by mouth daily., Disp: , Rfl:    ALPRAZolam (XANAX) 0.5 MG tablet, TAKE 1 TABLET(0.5 MG) BY MOUTH THREE TIMES DAILY AS NEEDED FOR ANXIETY, Disp: 30 tablet, Rfl: 0   venlafaxine XR (EFFEXOR-XR) 75 MG 24 hr capsule, Take 1 capsule (75 mg total) by mouth daily with breakfast., Disp: 90 capsule, Rfl: 1   No Known Allergies   Review of Systems  Constitutional: Negative.   Respiratory: Negative.    Cardiovascular: Negative.  Negative for chest pain, palpitations and leg swelling.  Endocrine: Negative for polydipsia, polyphagia and  polyuria.  Neurological: Negative.   Psychiatric/Behavioral: Negative.  Negative for sleep disturbance. The patient has insomnia.      Today's Vitals   02/05/22 1008  BP: 118/82  Pulse: 82  Temp: 98.1 F (36.7 C)  SpO2: 98%  Weight: 176 lb 6.4 oz (80 kg)  Height: '5\' 6"'$  (1.676 m)   Body mass index is 28.47 kg/m.  Wt Readings from Last 3 Encounters:  02/05/22 177 lb 3.2 oz (80.4 kg)  02/05/22 176 lb 6.4 oz (80 kg)  08/06/21 168 lb 7 oz (76.4 kg)    Objective:  Physical Exam Vitals reviewed.  Constitutional:      General: She is not in acute distress.    Appearance: Normal appearance.  Cardiovascular:     Rate and Rhythm: Normal rate and regular rhythm.     Pulses: Normal pulses.     Heart sounds: Normal heart sounds. No murmur heard. Pulmonary:     Effort: Pulmonary effort is normal. No respiratory distress.     Breath sounds: Normal breath sounds. No wheezing.  Skin:    General: Skin is warm and dry.     Capillary Refill: Capillary refill takes less than 2 seconds.     Comments: Darkened medial aspect left lower extremity  Neurological:     General: No focal deficit present.     Mental Status: She is alert and oriented to person, place, and time.     Cranial Nerves: No cranial nerve deficit.     Motor: No weakness.  Psychiatric:        Mood and Affect: Mood normal.        Behavior: Behavior normal.        Thought Content: Thought content normal.        Judgment: Judgment normal.         Assessment And Plan:     1. Type 2 diabetes mellitus with diabetic peripheral angiopathy without gangrene, without long-term current use of insulin (HCC) Comments: she is paying $2700 for a 3 month supply of rybelsus.  Will send prescription for [facility].  Sample given in office and explained how to administer. - CMP14+EGFR - Hemoglobin A1c - tirzepatide (MOUNJARO) 5 MG/0.5ML Pen; Inject 5 mg into the skin once a week.  Dispense: 2 mL; Refill: 0  2. Other insomnia Comments:  Overall doing well.  3. Anxiety Comments: Overall doing well, taking benzodiazepine intermittently - ALPRAZolam (XANAX) 0.5 MG tablet; TAKE 1 TABLET(0.5 MG) BY MOUTH THREE TIMES DAILY AS NEEDED FOR ANXIETY  Dispense: 30 tablet; Refill: 0  4. Atherosclerosis of aorta (HCC) Comments: Continue statin, tolerating well. - Lipid panel  5. Breast cancer, BRCA2 positive, right ([place]) Comments: Continue f/u with Oncology  6. Need for Tdap vaccination Will give tetanus vaccine today while in office. Refer to order management. TDAP will be administered to adults 33-88 years old every 10 years. - Tdap vaccine greater than or equal to 7yo IM  7. Body mass index (BMI) 28.0-28.9, adult  8. Other long term (current) drug therapy - CBC     Patient was given opportunity to ask questions. Patient verbalized understanding of the plan and was able to repeat key elements of the plan. All questions were answered to their satisfaction.  Vanessa Brine, FNP   I, Vanessa Brine, FNP, have reviewed all documentation for this visit. The documentation on 02/05/22 for the exam, diagnosis, procedures, and orders are all accurate and complete.   IF YOU HAVE BEEN REFERRED TO A SPECIALIST, IT MAY TAKE 1-2 WEEKS TO SCHEDULE/PROCESS THE REFERRAL. IF YOU HAVE NOT HEARD FROM US/SPECIALIST IN TWO WEEKS, PLEASE GIVE [place] A CALL AT [phone].   THE PATIENT IS ENCOURAGED TO PRACTICE SOCIAL DISTANCING DUE TO THE COVID-19 PANDEMIC.

## 2022-02-06 LAB — CBC
Hematocrit: 41 % (ref 34.0–46.6)
Hemoglobin: 13.6 g/dL (ref 11.1–15.9)
MCH: 27 pg (ref 26.6–33.0)
MCHC: 33.2 g/dL (ref 31.5–35.7)
MCV: 81 fL (ref 79–97)
Platelets: 243 10*3/uL (ref 150–450)
RBC: 5.04 x10E6/uL (ref 3.77–5.28)
RDW: 12.9 % (ref 11.7–15.4)
WBC: 4.2 10*3/uL (ref 3.4–10.8)

## 2022-02-06 LAB — CMP14+EGFR
ALT: 27 IU/L (ref 0–32)
AST: 21 IU/L (ref 0–40)
Albumin/Globulin Ratio: 2.2 (ref 1.2–2.2)
Albumin: 4.7 g/dL (ref 3.8–4.9)
Alkaline Phosphatase: 64 IU/L (ref 44–121)
BUN/Creatinine Ratio: 13 (ref 9–23)
BUN: 12 mg/dL (ref 6–24)
Bilirubin Total: 0.5 mg/dL (ref 0.0–1.2)
CO2: 21 mmol/L (ref 20–29)
Calcium: 9.3 mg/dL (ref 8.7–10.2)
Chloride: 106 mmol/L (ref 96–106)
Creatinine, Ser: 0.89 mg/dL (ref 0.57–1.00)
Globulin, Total: 2.1 g/dL (ref 1.5–4.5)
Glucose: 123 mg/dL — ABNORMAL HIGH (ref 70–99)
Potassium: 3.9 mmol/L (ref 3.5–5.2)
Sodium: 141 mmol/L (ref 134–144)
Total Protein: 6.8 g/dL (ref 6.0–8.5)
eGFR: 77 mL/min/{1.73_m2} (ref >59)

## 2022-02-06 LAB — LIPID PANEL
Chol/HDL Ratio: 3 ratio (ref 0.0–4.4)
Cholesterol, Total: 142 mg/dL (ref 100–199)
HDL: 48 mg/dL (ref >39)
LDL Chol Calc (NIH): 73 mg/dL (ref 0–99)
Triglycerides: 119 mg/dL (ref 0–149)
VLDL Cholesterol Cal: 21 mg/dL (ref 5–40)

## 2022-02-06 LAB — HEMOGLOBIN A1C
Est. average glucose Bld gHb Est-mCnc: 140 mg/dL
Hgb A1c MFr Bld: 6.5 % — ABNORMAL HIGH (ref 4.8–5.6)

## 2022-02-08 ENCOUNTER — Encounter: Payer: Self-pay | Admitting: Hematology and Oncology

## 2022-02-08 ENCOUNTER — Other Ambulatory Visit: Payer: Self-pay | Admitting: Hematology and Oncology

## 2022-02-08 DIAGNOSIS — Z1501 Genetic susceptibility to malignant neoplasm of breast: Secondary | ICD-10-CM

## 2022-02-11 ENCOUNTER — Other Ambulatory Visit: Payer: Self-pay | Admitting: Hematology and Oncology

## 2022-02-11 NOTE — Progress Notes (Signed)
Received below message via inbasket:  "I received a call from Edison at the [facility].  They needed to change her orders to accommodate her insurance.  They are in [facility] for you to sign off on.  If you have any questions, her number is [phone] Option 1 then Option 2. "  Called and LVM, voicemail stated closed for holiday.

## 2022-02-14 ENCOUNTER — Encounter: Payer: Self-pay | Admitting: Nurse Practitioner

## 2022-02-14 ENCOUNTER — Other Ambulatory Visit: Payer: Self-pay | Admitting: *Deleted

## 2022-02-14 ENCOUNTER — Other Ambulatory Visit: Payer: Self-pay | Admitting: Hematology and Oncology

## 2022-02-14 ENCOUNTER — Ambulatory Visit
Admission: RE | Admit: 2022-02-14 | Discharge: 2022-02-14 | Disposition: A | Discharge status: Home / Self Care | Payer: BC Managed Care – PPO | Source: Ambulatory Visit | Attending: Hematology and Oncology | Admitting: Hematology and Oncology

## 2022-02-14 ENCOUNTER — Encounter: Payer: Self-pay | Admitting: Hematology and Oncology

## 2022-02-14 DIAGNOSIS — Z1501 Genetic susceptibility to malignant neoplasm of breast: Secondary | ICD-10-CM

## 2022-02-14 DIAGNOSIS — Z853 Personal history of malignant neoplasm of breast: Secondary | ICD-10-CM | POA: Diagnosis not present

## 2022-02-14 DIAGNOSIS — D0511 Intraductal carcinoma in situ of right breast: Secondary | ICD-10-CM

## 2022-02-15 DIAGNOSIS — M25511 Pain in right shoulder: Secondary | ICD-10-CM | POA: Diagnosis not present

## 2022-02-18 ENCOUNTER — Other Ambulatory Visit: Payer: BC Managed Care – PPO

## 2022-02-27 ENCOUNTER — Inpatient Hospital Stay (HOSPITAL_BASED_OUTPATIENT_CLINIC_OR_DEPARTMENT_OTHER): Payer: BC Managed Care – PPO | Admitting: Hematology and Oncology

## 2022-02-27 ENCOUNTER — Encounter: Payer: Self-pay | Admitting: Hematology and Oncology

## 2022-02-27 DIAGNOSIS — Z1501 Genetic susceptibility to malignant neoplasm of breast: Secondary | ICD-10-CM

## 2022-02-27 DIAGNOSIS — Z1502 Genetic susceptibility to malignant neoplasm of ovary: Secondary | ICD-10-CM

## 2022-02-27 DIAGNOSIS — Z1509 Genetic susceptibility to other malignant neoplasm: Secondary | ICD-10-CM

## 2022-02-27 DIAGNOSIS — D0511 Intraductal carcinoma in situ of right breast: Secondary | ICD-10-CM

## 2022-02-27 NOTE — Progress Notes (Signed)
[place]  [phone] [phone]     ID: Vanessa Allen DOB: December 17, 1966  MR#: [id number]  EHU#:[id number]  Patient Care Team: [name], [place] as PCP - General (General Practice) Erroll Luna, MD as Consulting Physician (General Surgery) Kyung Rudd, MD as Consulting Physician (Radiation Oncology) Mauro Kaufmann, RN as Oncology Nurse Navigator Rockwell Germany, RN as Oncology Nurse Navigator Lafonda Mosses, MD as Consulting Physician (Gynecologic Oncology) Benay Pike, MD as Consulting Physician (Hematology and Oncology) Benay Pike, MD  CHIEF COMPLAINT:  Ductal carcinoma in situ; BRCA2+  CURRENT TREATMENT: tamoxifen  INTERVAL HISTORY:  [name] returns today for follow up of her noninvasive breast cancer. She started tamoxifen on 12/08/2019.  She is here for a telephone visit to review mammogram and [place] results. Rest of the pertinent 10 point ROS reviewed and negative  COVID 19 VACCINATION STATUS: [place] x2, status post booster   HISTORY OF CURRENT ILLNESS: From the original intake note:  Vanessa Allen had routine screening mammography on 07/23/2019 showing a possible abnormality in the right breast. She underwent right diagnostic mammography with tomography at [place] on 08/19/2019 showing: breast density category C; indeterminate 1.9 cm medial right breast calcifications.  Accordingly on 08/25/2019 she proceeded to biopsy of the right breast area in question. The pathology from this procedure ([id number]) showed: ductal carcinoma involving a papillary lesion. There is at least intermediate grade ductal carcinoma in situ, however, the lesion is fragmented hampering assessment and invasion can not be excluded. Lymph node sampling at the time of excision is recommended. Prognostic indicators significant for: estrogen receptor, 90% positive and progesterone receptor, 90% positive, both with strong staining  intensity.  Right axilla ultrasound was performed on 08/31/2019 showing no abnormal-appearing lymph nodes.   The patient's subsequent history is as detailed below.   PAST MEDICAL HISTORY: Past Medical History:  Diagnosis Date   Arthritis    knees and back    Atypical chest pain 07/20/2018   BRCA2 gene mutation positive in female    Cancer [facility])    Right Breast Cancer   Chest discomfort 7/41/2878   Complication of anesthesia    Diabetes mellitus without complication ([place])    type 2    Endometriosis    Family history of adverse reaction to anesthesia    brother has issues with n/v after surgery    Family history of BRCA gene mutation    Family history of breast cancer    Family history of leukemia    Family history of ovarian cancer    Family history of stomach cancer    Fibroid uterus    GERD (gastroesophageal reflux disease)    Personal history of radiation therapy    PONV (postoperative nausea and vomiting)    Pure hypercholesterolemia 04/30/2021   Tachycardia 11/13/2020   Venous insufficiency of lower extremity     PAST SURGICAL HISTORY: Past Surgical History:  Procedure Laterality Date   APPENDECTOMY     BREAST BIOPSY Right 08/25/2019   BREAST LUMPECTOMY Right 09/23/2019   BREAST LUMPECTOMY WITH RADIOACTIVE SEED AND SENTINEL LYMPH NODE BIOPSY Right 09/23/2019   Procedure: RIGHT BREAST LUMPECTOMY WITH RADIOACTIVE SEED AND SENTINEL LYMPH NODE MAPPING;  Surgeon: Erroll Luna, MD;  Location: [place];  Service: General;  Laterality: Right;  PEC BLOCK   CHOLECYSTECTOMY     lsc chole cystectomy and appendectomy 1 month after myomectomy   CYST EXCISION Left 12/13/2019   Procedure: Excision left dorsal wrist ganglion cyst;  Surgeon: Cindra Presume, MD;  Location: [place];  Service: Plastics;  Laterality: Left;  45 min   ENDOVENOUS ABLATION SAPHENOUS VEIN W/ LASER Left 12/30/2019   endovenous laser ablation left greater saphenous vein by Gae Gallop MD     Lipoma removal Left    Left Lower Back   MYOMECTOMY     ROBOTIC ASSISTED SALPINGO OOPHERECTOMY Bilateral 05/23/2020   Procedure: XI ROBOTIC ASSISTED SALPINGO OOPHORECTOMY ;ENTEROLYSIS;  Surgeon: Lafonda Mosses, MD;  Location: [facility];  Service: Gynecology;  Laterality: Bilateral;   WISDOM TOOTH EXTRACTION      FAMILY HISTORY: Family History  Problem Relation Age of Onset   Heart failure Mother    Hyperlipidemia Mother    Hypertension Mother    Varicose Veins Mother    Diabetes Father    Heart disease Father    Stomach cancer Father 15   CAD Father    Hypertension Brother    Leukemia Paternal Aunt        dx. >50   Ovarian cancer Cousin        d. <50 (maternal first cousin)   Ovarian cancer Cousin        d. <50 (maternal first cousin)   Ovarian cancer Cousin        d. late 20s (maternal first cousin)   Breast cancer Niece 42   BRCA 1/2 Niece    Colon cancer Neg Hx    Pancreatic cancer Neg Hx    Prostate cancer Neg Hx    Her father died at age 91 after being diagnosed with stomach cancer. Her mother is age 30 (as of 08/2019). Vanessa Allen has 3 brothers and 2 sisters. She reports ovarian cancer in two maternal aunts and breast cancer in a niece (brother's daughter) at age 71.   GYNECOLOGIC HISTORY:  No LMP recorded. Patient is postmenopausal. Menarche: 57 years old GX P 0 LMP age [age] (early menopause) Contraceptive never used HRT used for <1 year  Hysterectomy? No, only myomectomy BSO? 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 She is single. She lives at home by herelf.    ADVANCED DIRECTIVES: not in place. She intends to name one of her sisters, Clent Demark or Insurance account manager as healthcare power of attorney.   HEALTH MAINTENANCE: Social History   Tobacco Use   Smoking status: Never   Smokeless tobacco: Never  Vaping Use   Vaping Use: Never used  Substance Use Topics   Alcohol use: Yes     Alcohol/week: 0.0 standard drinks of alcohol    Comment: rare    Drug use: Never     Colonoscopy: 11/2017, repeat due 2029  PAP: 06/2018, negative  Bone density: never done   No Known Allergies  Current Outpatient Medications  Medication Sig Dispense Refill   ACCU-CHEK GUIDE test strip USE TWICE DAILY TO CHECK BLOOD SUGAR BEFORE BREAKFAST AND DINNER 100 strip 5   acidophilus (RISAQUAD) CAPS capsule Take 1 capsule by mouth daily.     ALPRAZolam (XANAX) 0.5 MG tablet TAKE 1 TABLET(0.5 MG) BY MOUTH THREE TIMES DAILY AS NEEDED FOR ANXIETY 30 tablet 0   aspirin EC 81 MG tablet Take 81 mg by mouth daily. Swallow whole.     atorvastatin (LIPITOR) 10 MG tablet TAKE 1 TABLET(10 MG) BY MOUTH EVERY 7 DAYS 12 tablet 0   Biotin w/ Vitamins C & E (HAIR/SKIN/NAILS PO) Take 1 tablet by  mouth daily.     Coenzyme Q10 (CO Q-10) 100 MG CAPS Take by mouth.     Continuous Blood Gluc Receiver (FREESTYLE LIBRE 14 DAY READER) DEVI USE TO CHECK BLOOD SUGAR BEFORE MEALS AND AT BEDTIME 1 each 2   Continuous Blood Gluc Sensor (FREESTYLE LIBRE 14 DAY SENSOR) MISC USE TO MEASURE BLOOD GLUCOSE FOUR TIMES DAILY BEFORE MEALS AND AT BEDTIME 2 each 11   esomeprazole (NEXIUM) 20 MG capsule TAKE 1 CAPSULE(20 MG) BY MOUTH DAILY 90 capsule 1   gabapentin (NEURONTIN) 300 MG capsule Take 1 capsule (300 mg total) by mouth at bedtime. 90 capsule 4   Glucose Blood (GLUCOSE METER TEST VI) by In Vitro route 2 (two) times daily.     metFORMIN (GLUCOPHAGE) 500 MG tablet TAKE 1 TABLET(500 MG) BY MOUTH EVERY DAY WITH BREAKFAST 90 tablet 1   Multiple Vitamin (MULTIVITAMIN WITH MINERALS) TABS tablet Take 1 tablet by mouth daily.     Omega-3 1000 MG CAPS Take 1,000 mg by mouth daily.     tamoxifen (NOLVADEX) 20 MG tablet TAKE 1 TABLET(20 MG) BY MOUTH DAILY 90 tablet 2   tirzepatide (MOUNJARO) 5 MG/0.5ML Pen Inject 5 mg into the skin once a week. 2 mL 0   Turmeric (QC TUMERIC COMPLEX PO) Take 1,000 mg by mouth daily.     venlafaxine XR  (EFFEXOR-XR) 75 MG 24 hr capsule Take 1 capsule (75 mg total) by mouth daily with breakfast. 90 capsule 1   No current facility-administered medications for this visit.    OBJECTIVE: African-American woman who appears younger than stated age  There were no vitals filed for this visit.     There is no height or weight on file to calculate BMI.   Wt Readings from Last 3 Encounters:  02/05/22 177 lb 3.2 oz (80.4 kg)  02/05/22 176 lb 6.4 oz (80 kg)  08/06/21 168 lb 7 oz (76.4 kg)     ECOG FS:2 - Symptomatic, <50% confined to bed  PE not done, telephone visit.  LAB RESULTS:  CMP     Component Value Date/Time   NA 141 02/05/2022 1049   K 3.9 02/05/2022 1049   CL 106 02/05/2022 1049   CO2 21 02/05/2022 1049   GLUCOSE 123 (H) 02/05/2022 1049   GLUCOSE 127 (H) 12/28/2020 1344   BUN 12 02/05/2022 1049   CREATININE 0.89 02/05/2022 1049   CREATININE 0.86 12/28/2020 1344   CALCIUM 9.3 02/05/2022 1049   PROT 6.8 02/05/2022 1049   ALBUMIN 4.7 02/05/2022 1049   AST 21 02/05/2022 1049   AST 14 (L) 12/28/2020 1344   ALT 27 02/05/2022 1049   ALT 21 12/28/2020 1344   ALKPHOS 64 02/05/2022 1049   BILITOT 0.5 02/05/2022 1049   BILITOT 0.6 12/28/2020 1344   GFRNONAA >60 12/28/2020 1344   GFRAA 75 02/28/2020 1049   GFRAA >60 09/01/2019 1210    No results found for: "TOTALPROTELP", "ALBUMINELP", "A1GS", "A2GS", "BETS", "BETA2SER", "GAMS", "MSPIKE", "SPEI"  Lab Results  Component Value Date   WBC 4.2 02/05/2022   NEUTROABS 1.8 08/06/2021   HGB 13.6 02/05/2022   HCT 41.0 02/05/2022   MCV 81 02/05/2022   PLT 243 02/05/2022    No results found for: "LABCA2"  No components found for: "XBJYNW295"  No results for input(s): "INR" in the last 168 hours.  No results found for: "LABCA2"  No results found for: "AOZ308"  Lab Results  Component Value Date   MVH846 35.5 02/07/2020  No results found for: "CAN153"  No results found for: "CA2729"  No components found for:  "HGQUANT"  No results found for: "CEA1", "CEA" / No results found for: "CEA1", "CEA"   No results found for: "AFPTUMOR"  No results found for: "CHROMOGRNA"  No results found for: "KPAFRELGTCHN", "LAMBDASER", "KAPLAMBRATIO" (kappa/lambda light chains)  No results found for: "HGBA", "HGBA2QUANT", "HGBFQUANT", "HGBSQUAN" (Hemoglobinopathy evaluation)   No results found for: "LDH"  No results found for: "IRON", "TIBC", "IRONPCTSAT" (Iron and TIBC)  No results found for: "FERRITIN"  Urinalysis    Component Value Date/Time   COLORURINE YELLOW 04/21/2020 1318   APPEARANCEUR CLEAR 04/21/2020 1318   LABSPEC 1.019 04/21/2020 1318   PHURINE 6.0 04/21/2020 1318   GLUCOSEU NEGATIVE 04/21/2020 1318   HGBUR NEGATIVE 04/21/2020 1318   BILIRUBINUR Negative 08/06/2021 1720   KETONESUR NEGATIVE 04/21/2020 1318   PROTEINUR Negative 08/06/2021 1720   PROTEINUR NEGATIVE 04/21/2020 1318   UROBILINOGEN 0.2 08/06/2021 1720   NITRITE Negative 08/06/2021 1720   NITRITE NEGATIVE 04/21/2020 1318   LEUKOCYTESUR Trace (A) 08/06/2021 1720   LEUKOCYTESUR SMALL (A) 04/21/2020 1318    STUDIES: MM DIAG BREAST TOMO BILATERAL  Result Date: 02/14/2022 CLINICAL DATA:  Ordering physician describes a palpable lump within the upper-outer quadrant of the RIGHT breast. History of RIGHT breast cancer status post lumpectomy in 2021. EXAM: DIGITAL DIAGNOSTIC BILATERAL MAMMOGRAM WITH TOMOSYNTHESIS; ULTRASOUND RIGHT BREAST LIMITED TECHNIQUE: Bilateral digital diagnostic mammography and breast tomosynthesis was performed.; Targeted ultrasound examination of the right breast was performed COMPARISON:  Previous exam(s). ACR Breast Density Category c: The breast tissue is heterogeneously dense, which may obscure small masses. FINDINGS: There are stable postsurgical changes within the inner RIGHT breast. Biopsy clip within the outer RIGHT breast corresponds to an earlier benign MRI-guided biopsy site. There are no new dominant  masses, suspicious calcifications or secondary signs of malignancy within either breast. Specifically, there is no mammographic abnormality identified within the upper-outer quadrant of the RIGHT breast corresponding to the area of clinical concern. Targeted ultrasound is performed, evaluating the entire upper-outer quadrant of the RIGHT breast, showing only normal fibroglandular tissues and fat lobules throughout. No solid or cystic mass. IMPRESSION: 1. No evidence of malignancy within either breast. 2. Normal dense fibroglandular tissues within the upper-outer quadrant of the RIGHT breast, corresponding to the area of clinical concern. 3. Stable postsurgical changes within the inner RIGHT breast. RECOMMENDATION: 1.  Screening mammogram in one year.(Code:SM-B-01Y) 2. Per protocol, as the patient is now 2 or more years status post lumpectomy, she may return to annual screening mammography in 1 year. However, given the history of breast cancer, the patient remains eligible for annual diagnostic mammography if preferred. I have discussed the findings and recommendations with the patient. If applicable, a reminder letter will be sent to the patient regarding the next appointment. BI-RADS CATEGORY  2: Benign. Electronically Signed   By: Franki Cabot M.D.   On: 02/14/2022 13:12   US BREAST LTD UNI RIGHT INC AXILLA  Result Date: 02/14/2022 CLINICAL DATA:  Ordering physician describes a palpable lump within the upper-outer quadrant of the RIGHT breast. History of RIGHT breast cancer status post lumpectomy in 2021. EXAM: DIGITAL DIAGNOSTIC BILATERAL MAMMOGRAM WITH TOMOSYNTHESIS; ULTRASOUND RIGHT BREAST LIMITED TECHNIQUE: Bilateral digital diagnostic mammography and breast tomosynthesis was performed.; Targeted ultrasound examination of the right breast was performed COMPARISON:  Previous exam(s). ACR Breast Density Category c: The breast tissue is heterogeneously dense, which may obscure small masses. FINDINGS: There  are stable  postsurgical changes within the inner RIGHT breast. Biopsy clip within the outer RIGHT breast corresponds to an earlier benign MRI-guided biopsy site. There are no new dominant masses, suspicious calcifications or secondary signs of malignancy within either breast. Specifically, there is no mammographic abnormality identified within the upper-outer quadrant of the RIGHT breast corresponding to the area of clinical concern. Targeted ultrasound is performed, evaluating the entire upper-outer quadrant of the RIGHT breast, showing only normal fibroglandular tissues and fat lobules throughout. No solid or cystic mass. IMPRESSION: 1. No evidence of malignancy within either breast. 2. Normal dense fibroglandular tissues within the upper-outer quadrant of the RIGHT breast, corresponding to the area of clinical concern. 3. Stable postsurgical changes within the inner RIGHT breast. RECOMMENDATION: 1.  Screening mammogram in one year.(Code:SM-B-01Y) 2. Per protocol, as the patient is now 2 or more years status post lumpectomy, she may return to annual screening mammography in 1 year. However, given the history of breast cancer, the patient remains eligible for annual diagnostic mammography if preferred. I have discussed the findings and recommendations with the patient. If applicable, a reminder letter will be sent to the patient regarding the next appointment. BI-RADS CATEGORY  2: Benign. Electronically Signed   By: Franki Cabot M.D.   On: 02/14/2022 13:12    ELIGIBLE FOR AVAILABLE RESEARCH PROTOCOL: AET  ASSESSMENT: 57 y.o. [place] woman status post right breast biopsy 08/25/2019 for ductal carcinoma in situ, grade 2 or 3, estrogen and progesterone receptor positive  (1) genetics testing 09/09/2019 found a heterozygous pathogenic variant  in the BRCA2 gene called c.[id number] (p.[id number])  (a) no additional deleterious mutations were noted through the [facility] Breast Cancer STAT panel in ATM, BRCA1,  BRCA2, CDH1, CHEK2, PALB2, PTEN, STK11 and TP53.  (2) right lumpectomy and sentinel lymph node sampling 09/23/2019 showed ductal carcinoma in situ, largest section 0.5 cm, with negative margins: pTis pN0, stage 0  (a) a total of 2 axillary lymph nodes were removed  (3) adjuvant radiation completed 12/08/2019 (50.4 Gray to the right breast plus boost)  (4) tamoxifen started 12/08/2019  (5) bilateral salpingo-oophorectomy pendingS/p bilateral salpingo-oophorectomy 05/23/2020 with benign pathoogy  (6) intensified screening with mammography in February and breast MRI in August.   PLAN:  Patient is here for follow-up given her history of DCIS, currently on tamoxifen as well as because of the BRCA2 mutation. She is up-to-date with her colon cancer screening and skin cancer screening. Most recent mammogram and [place] with no concerning findings. I have reassured her. She will continue tamoxifen, follow up with Dr Barry Dienes and RTC in one yr with me.   Total time spent: 6 min  I connected with  Dala Macek on 02/27/22 by a telephone application and verified that I am speaking with the correct person using two identifiers.   I discussed the limitations of evaluation and management by telemedicine. The patient expressed understanding and agreed to proceed.   *Total Encounter Time as defined by the Centers for Medicare and Medicaid Services includes, in addition to the face-to-face time of a patient visit (documented in the note above) non-face-to-face time: obtaining and reviewing outside history, ordering and reviewing medications, tests or procedures, care coordination (communications with other health care professionals or caregivers) and documentation in the medical record.

## 2022-03-05 ENCOUNTER — Other Ambulatory Visit: Payer: Self-pay | Admitting: Nurse Practitioner

## 2022-03-09 DIAGNOSIS — M25511 Pain in right shoulder: Secondary | ICD-10-CM | POA: Diagnosis not present

## 2022-03-13 DIAGNOSIS — M25511 Pain in right shoulder: Secondary | ICD-10-CM | POA: Diagnosis not present

## 2022-03-26 DIAGNOSIS — M6281 Muscle weakness (generalized): Secondary | ICD-10-CM | POA: Diagnosis not present

## 2022-03-26 DIAGNOSIS — M7541 Impingement syndrome of right shoulder: Secondary | ICD-10-CM | POA: Diagnosis not present

## 2022-03-26 DIAGNOSIS — M542 Cervicalgia: Secondary | ICD-10-CM | POA: Diagnosis not present

## 2022-03-27 ENCOUNTER — Other Ambulatory Visit: Payer: Self-pay

## 2022-03-27 DIAGNOSIS — E1151 Type 2 diabetes mellitus with diabetic peripheral angiopathy without gangrene: Secondary | ICD-10-CM

## 2022-03-27 MED ORDER — MOUNJARO 5 MG/0.5ML ~~LOC~~ SOAJ: 5.0000 mg | SUBCUTANEOUS | 1 refills | Status: DC

## 2022-03-28 DIAGNOSIS — M542 Cervicalgia: Secondary | ICD-10-CM | POA: Diagnosis not present

## 2022-03-28 DIAGNOSIS — M7541 Impingement syndrome of right shoulder: Secondary | ICD-10-CM | POA: Diagnosis not present

## 2022-03-28 DIAGNOSIS — M6281 Muscle weakness (generalized): Secondary | ICD-10-CM | POA: Diagnosis not present

## 2022-04-02 DIAGNOSIS — M542 Cervicalgia: Secondary | ICD-10-CM | POA: Diagnosis not present

## 2022-04-02 DIAGNOSIS — M7541 Impingement syndrome of right shoulder: Secondary | ICD-10-CM | POA: Diagnosis not present

## 2022-04-02 DIAGNOSIS — M6281 Muscle weakness (generalized): Secondary | ICD-10-CM | POA: Diagnosis not present

## 2022-04-10 DIAGNOSIS — Z1509 Genetic susceptibility to other malignant neoplasm: Secondary | ICD-10-CM | POA: Diagnosis not present

## 2022-04-10 DIAGNOSIS — Z1501 Genetic susceptibility to malignant neoplasm of breast: Secondary | ICD-10-CM | POA: Diagnosis not present

## 2022-04-10 DIAGNOSIS — Z421 Encounter for breast reconstruction following mastectomy: Secondary | ICD-10-CM | POA: Diagnosis not present

## 2022-04-10 DIAGNOSIS — Z853 Personal history of malignant neoplasm of breast: Secondary | ICD-10-CM | POA: Diagnosis not present

## 2022-04-18 DIAGNOSIS — D0511 Intraductal carcinoma in situ of right breast: Secondary | ICD-10-CM | POA: Diagnosis not present

## 2022-04-19 DIAGNOSIS — Z86000 Personal history of in-situ neoplasm of breast: Secondary | ICD-10-CM | POA: Diagnosis not present

## 2022-04-19 DIAGNOSIS — Z803 Family history of malignant neoplasm of breast: Secondary | ICD-10-CM | POA: Diagnosis not present

## 2022-04-19 DIAGNOSIS — E119 Type 2 diabetes mellitus without complications: Secondary | ICD-10-CM | POA: Diagnosis not present

## 2022-04-19 DIAGNOSIS — Z7984 Long term (current) use of oral hypoglycemic drugs: Secondary | ICD-10-CM | POA: Diagnosis not present

## 2022-04-19 DIAGNOSIS — Z7982 Long term (current) use of aspirin: Secondary | ICD-10-CM | POA: Diagnosis not present

## 2022-04-19 DIAGNOSIS — Z7183 Encounter for nonprocreative genetic counseling: Secondary | ICD-10-CM | POA: Diagnosis not present

## 2022-04-19 DIAGNOSIS — Z1509 Genetic susceptibility to other malignant neoplasm: Secondary | ICD-10-CM | POA: Diagnosis not present

## 2022-04-19 DIAGNOSIS — Z1501 Genetic susceptibility to malignant neoplasm of breast: Secondary | ICD-10-CM | POA: Diagnosis not present

## 2022-04-24 DIAGNOSIS — Z421 Encounter for breast reconstruction following mastectomy: Secondary | ICD-10-CM | POA: Diagnosis not present

## 2022-04-24 DIAGNOSIS — Z1509 Genetic susceptibility to other malignant neoplasm: Secondary | ICD-10-CM | POA: Diagnosis not present

## 2022-04-24 DIAGNOSIS — Z1501 Genetic susceptibility to malignant neoplasm of breast: Secondary | ICD-10-CM | POA: Diagnosis not present

## 2022-04-24 DIAGNOSIS — Z853 Personal history of malignant neoplasm of breast: Secondary | ICD-10-CM | POA: Diagnosis not present

## 2022-04-30 ENCOUNTER — Encounter: Payer: Self-pay | Admitting: Nurse Practitioner

## 2022-05-01 ENCOUNTER — Other Ambulatory Visit: Payer: Self-pay | Admitting: Nurse Practitioner

## 2022-05-01 DIAGNOSIS — E1151 Type 2 diabetes mellitus with diabetic peripheral angiopathy without gangrene: Secondary | ICD-10-CM

## 2022-05-01 MED ORDER — MOUNJARO 7.5 MG/0.5ML ~~LOC~~ SOAJ: 7.5000 mg | SUBCUTANEOUS | 1 refills | Status: DC

## 2022-05-22 ENCOUNTER — Telehealth: Payer: Self-pay

## 2022-05-22 NOTE — Telephone Encounter (Signed)
Per pharmacy [facility] requires PA.  PA started via [facility] Key: [id number]

## 2022-05-23 NOTE — Telephone Encounter (Signed)
Per CMM: Canceled. Please note member with paid claims due to contingent therapy, this medication does not require review at this time .Please contact pharmacy to process prescription. Thank you

## 2022-05-27 ENCOUNTER — Other Ambulatory Visit: Payer: Self-pay

## 2022-05-27 DIAGNOSIS — E1151 Type 2 diabetes mellitus with diabetic peripheral angiopathy without gangrene: Secondary | ICD-10-CM

## 2022-05-27 MED ORDER — MOUNJARO 7.5 MG/0.5ML ~~LOC~~ SOAJ: 7.5000 mg | SUBCUTANEOUS | 1 refills | Status: DC

## 2022-08-12 DIAGNOSIS — Z86000 Personal history of in-situ neoplasm of breast: Secondary | ICD-10-CM | POA: Diagnosis not present

## 2022-08-12 DIAGNOSIS — Z7984 Long term (current) use of oral hypoglycemic drugs: Secondary | ICD-10-CM | POA: Diagnosis not present

## 2022-08-12 DIAGNOSIS — K219 Gastro-esophageal reflux disease without esophagitis: Secondary | ICD-10-CM | POA: Diagnosis not present

## 2022-08-12 DIAGNOSIS — Z780 Asymptomatic menopausal state: Secondary | ICD-10-CM | POA: Diagnosis not present

## 2022-08-12 DIAGNOSIS — Z7982 Long term (current) use of aspirin: Secondary | ICD-10-CM | POA: Diagnosis not present

## 2022-08-12 DIAGNOSIS — E119 Type 2 diabetes mellitus without complications: Secondary | ICD-10-CM | POA: Diagnosis not present

## 2022-08-12 DIAGNOSIS — Z1509 Genetic susceptibility to other malignant neoplasm: Secondary | ICD-10-CM | POA: Diagnosis not present

## 2022-08-12 DIAGNOSIS — E7800 Pure hypercholesterolemia, unspecified: Secondary | ICD-10-CM | POA: Diagnosis not present

## 2022-08-12 DIAGNOSIS — Z7985 Long-term (current) use of injectable non-insulin antidiabetic drugs: Secondary | ICD-10-CM | POA: Diagnosis not present

## 2022-08-12 DIAGNOSIS — Z79899 Other long term (current) drug therapy: Secondary | ICD-10-CM | POA: Diagnosis not present

## 2022-08-12 DIAGNOSIS — M1990 Unspecified osteoarthritis, unspecified site: Secondary | ICD-10-CM | POA: Diagnosis not present

## 2022-08-12 DIAGNOSIS — Z7183 Encounter for nonprocreative genetic counseling: Secondary | ICD-10-CM | POA: Diagnosis not present

## 2022-08-12 DIAGNOSIS — Z853 Personal history of malignant neoplasm of breast: Secondary | ICD-10-CM | POA: Diagnosis not present

## 2022-08-12 DIAGNOSIS — R9230 Dense breasts, unspecified: Secondary | ICD-10-CM | POA: Diagnosis not present

## 2022-08-12 DIAGNOSIS — E785 Hyperlipidemia, unspecified: Secondary | ICD-10-CM | POA: Diagnosis not present

## 2022-08-12 DIAGNOSIS — Z1501 Genetic susceptibility to malignant neoplasm of breast: Secondary | ICD-10-CM | POA: Diagnosis not present

## 2022-08-22 ENCOUNTER — Encounter: Payer: Self-pay | Admitting: Nurse Practitioner

## 2022-08-22 ENCOUNTER — Ambulatory Visit (INDEPENDENT_AMBULATORY_CARE_PROVIDER_SITE_OTHER): Payer: BC Managed Care – PPO | Admitting: Nurse Practitioner

## 2022-08-22 VITALS — BP 124/80 | HR 88 | Temp 99.4°F | Ht 66.0 in | Wt 178.2 lb

## 2022-08-22 DIAGNOSIS — E7800 Pure hypercholesterolemia, unspecified: Secondary | ICD-10-CM

## 2022-08-22 DIAGNOSIS — E119 Type 2 diabetes mellitus without complications: Secondary | ICD-10-CM | POA: Diagnosis not present

## 2022-08-22 DIAGNOSIS — M25551 Pain in right hip: Secondary | ICD-10-CM | POA: Diagnosis not present

## 2022-08-22 DIAGNOSIS — Z0001 Encounter for general adult medical examination with abnormal findings: Secondary | ICD-10-CM

## 2022-08-22 DIAGNOSIS — Z6828 Body mass index (BMI) 28.0-28.9, adult: Secondary | ICD-10-CM

## 2022-08-22 DIAGNOSIS — E1151 Type 2 diabetes mellitus with diabetic peripheral angiopathy without gangrene: Secondary | ICD-10-CM

## 2022-08-22 DIAGNOSIS — M25552 Pain in left hip: Secondary | ICD-10-CM | POA: Diagnosis not present

## 2022-08-22 DIAGNOSIS — I739 Peripheral vascular disease, unspecified: Secondary | ICD-10-CM

## 2022-08-22 DIAGNOSIS — E663 Overweight: Secondary | ICD-10-CM

## 2022-08-22 DIAGNOSIS — C50911 Malignant neoplasm of unspecified site of right female breast: Secondary | ICD-10-CM

## 2022-08-22 DIAGNOSIS — Z1501 Genetic susceptibility to malignant neoplasm of breast: Secondary | ICD-10-CM

## 2022-08-22 DIAGNOSIS — Z0000 Encounter for general adult medical examination without abnormal findings: Secondary | ICD-10-CM

## 2022-08-22 DIAGNOSIS — G4709 Other insomnia: Secondary | ICD-10-CM | POA: Diagnosis not present

## 2022-08-22 DIAGNOSIS — Z79899 Other long term (current) drug therapy: Secondary | ICD-10-CM | POA: Diagnosis not present

## 2022-08-22 DIAGNOSIS — F419 Anxiety disorder, unspecified: Secondary | ICD-10-CM

## 2022-08-22 LAB — HM DIABETES EYE EXAM

## 2022-08-22 MED ORDER — MELOXICAM 7.5 MG PO TABS: 7.5000 mg | ORAL_TABLET | Freq: Every day | ORAL | 2 refills | Status: DC

## 2022-08-22 MED ORDER — ALPRAZOLAM 0.5 MG PO TABS: ORAL_TABLET | ORAL | 0 refills | Status: DC

## 2022-08-22 MED ORDER — MELOXICAM 7.5 MG PO TABS: ORAL_TABLET | ORAL | 2 refills | Status: DC

## 2022-08-22 NOTE — Progress Notes (Unsigned)
Madelaine Bhat, CMA,acting as a Neurosurgeon for Arnette Felts, FNP.,have documented all relevant documentation on the behalf of Arnette Felts, FNP,as directed by  Arnette Felts, FNP while in the presence of Arnette Felts, FNP.  Subjective:    Patient ID: Vanessa Allen , female    DOB: 07-Nov-1965 , 57 y.o.   MRN: [id number]  Chief Complaint  Patient presents with   Annual Exam    HPI  Patient presents today for HM, patient reports compliance with medications. Patient denies any chest pain, SOB, or headaches. Patient has no other concerns today. She had a mammogram done last week which was normal. She has seen the Oncology NP. She has an eye appt this afternoon at [facility]. She does not have a GYN she had a salpingo oopherectomy due to BRCA2 cancer history. She is planning to have a planned bilateral mastectomy next year. Her niece at the age of 43 was diagnosed with BRCA2 breast cancer. Her sisters have also tested positive.  She was having problems with getting her Mounjaro filled but is now back on it 7.5 mg.   BP Readings from Last 3 Encounters: 08/22/22 : 124/80 02/05/22 : (!) 128/91 02/05/22 : 118/82   Wt Readings from Last 3 Encounters: 08/22/22 : 178 lb 3.2 oz (80.8 kg) 02/05/22 : 177 lb 3.2 oz (80.4 kg) 02/05/22 : 176 lb 6.4 oz (80 kg)       Past Medical History:  Diagnosis Date   Arthritis    knees and back    Atypical chest pain 07/20/2018   BRCA2 gene mutation positive in female    Cancer [facility])    Right Breast Cancer   Chest discomfort 05/21/2018   Complication of anesthesia    Diabetes mellitus without complication (HCC)    type 2    Endometriosis    Family history of adverse reaction to anesthesia    brother has issues with n/v after surgery    Family history of BRCA gene mutation    Family history of breast cancer    Family history of leukemia    Family history of ovarian cancer    Family history of stomach cancer    Fibroid uterus    GERD  (gastroesophageal reflux disease)    Personal history of radiation therapy    PONV (postoperative nausea and vomiting)    Pure hypercholesterolemia 04/30/2021   Tachycardia 11/13/2020   Venous insufficiency of lower extremity      Family History  Problem Relation Age of Onset   Heart failure Mother    Hyperlipidemia Mother    Hypertension Mother    Varicose Veins Mother    Diabetes Father    Heart disease Father    Stomach cancer Father 59   CAD Father    Hypertension Brother    Leukemia Paternal Aunt        dx. >50   Ovarian cancer Cousin        d. <50 (maternal first cousin)   Ovarian cancer Cousin        d. <50 (maternal first cousin)   Ovarian cancer Cousin        d. late 33s (maternal first cousin)   Breast cancer Niece 34   BRCA 1/2 Niece    Colon cancer Neg Hx    Pancreatic cancer Neg Hx    Prostate cancer Neg Hx      Current Outpatient Medications:    ACCU-CHEK GUIDE test strip, USE TWICE DAILY TO CHECK  BLOOD SUGAR BEFORE BREAKFAST AND DINNER, Disp: 100 strip, Rfl: 5   acidophilus (RISAQUAD) CAPS capsule, Take 1 capsule by mouth daily., Disp: , Rfl:    aspirin EC 81 MG tablet, Take 81 mg by mouth daily. Swallow whole., Disp: , Rfl:    atorvastatin (LIPITOR) 10 MG tablet, TAKE 1 TABLET(10 MG) BY MOUTH EVERY 7 DAYS, Disp: 12 tablet, Rfl: 0   Biotin w/ Vitamins C & E (HAIR/SKIN/NAILS PO), Take 1 tablet by mouth daily., Disp: , Rfl:    Coenzyme Q10 (CO Q-10) 100 MG CAPS, Take by mouth., Disp: , Rfl:    Continuous Blood Gluc Receiver (FREESTYLE LIBRE 14 DAY READER) DEVI, USE TO CHECK BLOOD SUGAR BEFORE MEALS AND AT BEDTIME, Disp: 1 each, Rfl: 2   Continuous Blood Gluc Sensor (FREESTYLE LIBRE 14 DAY SENSOR) MISC, USE TO MEASURE BLOOD GLUCOSE FOUR TIMES DAILY BEFORE MEALS AND AT BEDTIME, Disp: 2 each, Rfl: 11   esomeprazole (NEXIUM) 20 MG capsule, TAKE 1 CAPSULE( 20MG ) BY MOUTH EVERY DAY, Disp: 90 capsule, Rfl: 1   gabapentin (NEURONTIN) 300 MG capsule, Take 1 capsule (300  mg total) by mouth at bedtime., Disp: 90 capsule, Rfl: 4   Glucose Blood (GLUCOSE METER TEST VI), by In Vitro route 2 (two) times daily., Disp: , Rfl:    metFORMIN (GLUCOPHAGE) 500 MG tablet, TAKE 1 TABLET(500 MG) BY MOUTH EVERY DAY WITH BREAKFAST, Disp: 90 tablet, Rfl: 1   Multiple Vitamin (MULTIVITAMIN WITH MINERALS) TABS tablet, Take 1 tablet by mouth daily., Disp: , Rfl:    Omega-3 1000 MG CAPS, Take 1,000 mg by mouth daily., Disp: , Rfl:    tamoxifen (NOLVADEX) 20 MG tablet, TAKE 1 TABLET(20 MG) BY MOUTH DAILY, Disp: 90 tablet, Rfl: 2   tirzepatide (MOUNJARO) 7.5 MG/0.5ML Pen, Inject 7.5 mg into the skin once a week., Disp: 2 mL, Rfl: 1   Turmeric (QC TUMERIC COMPLEX PO), Take 1,000 mg by mouth daily., Disp: , Rfl:    venlafaxine XR (EFFEXOR-XR) 75 MG 24 hr capsule, Take 1 capsule (75 mg total) by mouth daily with breakfast., Disp: 90 capsule, Rfl: 1   ALPRAZolam (XANAX) 0.5 MG tablet, TAKE 1 TABLET(0.5 MG) BY MOUTH DAILY AS NEEDED FOR ANXIETY, Disp: 30 tablet, Rfl: 0   meloxicam (MOBIC) 7.5 MG tablet, Take 1 tablet by mouth daily x 5 days then daily as needed, Disp: 30 tablet, Rfl: 2   No Known Allergies    The patient states she uses post menopausal status for birth control. No LMP recorded. Patient is postmenopausal.. Negative for Dysmenorrhea and Negative for Menorrhagia. Negative for: breast discharge, breast lump(s), breast pain and breast self exam. Associated symptoms include abnormal vaginal bleeding. Pertinent negatives include abnormal bleeding (hematology), anxiety, decreased libido, depression, difficulty falling sleep, dyspareunia, history of infertility, nocturia, sexual dysfunction, sleep disturbances, urinary incontinence, urinary urgency, vaginal discharge and vaginal itching. Diet regular.The patient states her exercise level is minimal - has an active job.   The patient's tobacco use is:  Social History   Tobacco Use  Smoking Status Never  Smokeless Tobacco Never    She has been exposed to passive smoke. The patient's alcohol use is:  Social History   Substance and Sexual Activity  Alcohol Use Yes   Alcohol/week: 0.0 standard drinks of alcohol   Comment: rare   Additional information: Last pap 08/06/2021, next one scheduled for 08/06/2024.    Review of Systems  Constitutional: Negative.   HENT: Negative.    Eyes: Negative.  Respiratory: Negative.    Cardiovascular: Negative.   Gastrointestinal: Negative.   Endocrine: Negative.   Genitourinary: Negative.   Musculoskeletal:  Positive for joint swelling (bilateral hip pain sometimes worse at night).  Skin: Negative.   Allergic/Immunologic: Negative.   Neurological: Negative.   Hematological: Negative.   Psychiatric/Behavioral: Negative.       Today's Vitals   08/22/22 0938  BP: 124/80  Pulse: 88  Temp: 99.4 F (37.4 C)  SpO2: 97%  Weight: 178 lb 3.2 oz (80.8 kg)  Height: 5\' 6"  (1.676 m)  PainSc: 0-No pain   Body mass index is 28.76 kg/m.  Wt Readings from Last 3 Encounters:  08/22/22 178 lb 3.2 oz (80.8 kg)  02/05/22 177 lb 3.2 oz (80.4 kg)  02/05/22 176 lb 6.4 oz (80 kg)     Objective:  Physical Exam Vitals reviewed.  Constitutional:      General: She is not in acute distress.    Appearance: Normal appearance. She is well-developed. She is obese.  HENT:     Head: Normocephalic and atraumatic.     Right Ear: Hearing, tympanic membrane, ear canal and external ear normal. There is no impacted cerumen.     Left Ear: Hearing, tympanic membrane, ear canal and external ear normal. There is no impacted cerumen.     Nose: Nose normal.     Mouth/Throat:     Mouth: Mucous membranes are moist.  Eyes:     General: Lids are normal.     Extraocular Movements: Extraocular movements intact.     Conjunctiva/sclera: Conjunctivae normal.     Pupils: Pupils are equal, round, and reactive to light.     Funduscopic exam:    Right eye: No papilledema.        Left eye: No papilledema.   Neck:     Thyroid: No thyroid mass.     Vascular: No carotid bruit.  Cardiovascular:     Rate and Rhythm: Normal rate and regular rhythm.     Pulses: Normal pulses.     Heart sounds: Normal heart sounds. No murmur heard. Pulmonary:     Effort: Pulmonary effort is normal. No respiratory distress.     Breath sounds: Normal breath sounds. No wheezing.  Chest:     Chest wall: No mass.  Breasts:    Tanner Score is 5.     Right: Normal. No mass or tenderness.     Left: Normal. No mass or tenderness.  Abdominal:     General: Abdomen is flat. Bowel sounds are normal. There is no distension.     Palpations: Abdomen is soft.     Tenderness: There is no abdominal tenderness.  Genitourinary:    Rectum: Guaiac result negative.  Musculoskeletal:        General: No swelling. Normal range of motion.     Cervical back: Full passive range of motion without pain, normal range of motion and neck supple.     Right lower leg: No edema.     Left lower leg: No edema.  Lymphadenopathy:     Upper Body:     Right upper body: No supraclavicular, axillary or pectoral adenopathy.     Left upper body: No supraclavicular, axillary or pectoral adenopathy.  Skin:    General: Skin is warm and dry.     Capillary Refill: Capillary refill takes less than 2 seconds.     Findings: Lesion (left media calf with hyperpigmented) present.  Neurological:     General: No focal  deficit present.     Mental Status: She is alert and oriented to person, place, and time.     Cranial Nerves: No cranial nerve deficit.     Sensory: No sensory deficit.  Psychiatric:        Mood and Affect: Mood normal.        Behavior: Behavior normal.        Thought Content: Thought content normal.        Judgment: Judgment normal.         Assessment And Plan:     Encounter for annual health examination Assessment & Plan: Behavior modifications discussed and diet history reviewed.   Pt will continue to exercise regularly and modify  diet with low GI, plant based foods and decrease intake of processed foods.  Recommend intake of daily multivitamin, Vitamin D, and calcium.  Recommend mammogram and colonoscopy for preventive screenings, as well as recommend immunizations that include influenza, TDAP, and Shingles    Overweight with body mass index (BMI) of 28 to 28.9 in adult  Type 2 diabetes mellitus with diabetic peripheral angiopathy without gangrene, without long-term current use of insulin (HCC) Assessment & Plan: HgbA1c has been stable. Continue Mounjaro. Was having some challenges with stock but has recently been able to get medication. EKG done with SR HR 81  Orders: -     EKG 12-Lead -     Microalbumin / creatinine urine ratio -     POCT URINALYSIS DIP (CLINITEK) -     CMP14+EGFR -     Hemoglobin A1c  Other insomnia Assessment & Plan: Continue alprazolam, other sleep aids have been ineffective.  Orders: -     Drug Screen (9), Urine  Pure hypercholesterolemia Assessment & Plan: Continue statin,     Orders: -     Lipid panel  Bilateral hip pain Assessment & Plan: Encouraged to stretch regularly and to do low impact walking   Orders: -     Meloxicam; Take 1 tablet by mouth daily x 5 days then daily as needed  Dispense: 30 tablet; Refill: 2  Anxiety Assessment & Plan: Continue as needed alprazolam  Orders: -     ALPRAZolam; TAKE 1 TABLET(0.5 MG) BY MOUTH DAILY AS NEEDED FOR ANXIETY  Dispense: 30 tablet; Refill: 0  PVD (peripheral vascular disease) (HCC) Assessment & Plan: No further issues at this time. Continue statin and aspirin   Breast cancer, BRCA2 positive, right (HCC) Assessment & Plan: Continue f/u with Oncologist.    Other long term (current) drug therapy -     CBC with Differential/Platelet -     Drug Screen (9), Urine    Return for 1 year physical. Patient was given opportunity to ask questions. Patient verbalized understanding of the plan and was able to repeat key  elements of the plan. All questions were answered to their satisfaction.   Arnette Felts, FNP  I, Arnette Felts, FNP, have reviewed all documentation for this visit. The documentation on 07/2522 for the exam, diagnosis, procedures, and orders are all accurate and complete.

## 2022-08-23 LAB — HEMOGLOBIN A1C: Est. average glucose Bld gHb Est-mCnc: 169 mg/dL

## 2022-09-01 NOTE — Assessment & Plan Note (Signed)
Encouraged to stretch regularly and to do low impact walking

## 2022-09-01 NOTE — Assessment & Plan Note (Signed)
No further issues at this time. Continue statin and aspirin

## 2022-09-01 NOTE — Assessment & Plan Note (Signed)
Continue alprazolam, other sleep aids have been ineffective.

## 2022-09-01 NOTE — Assessment & Plan Note (Signed)
Continue f/u with Oncologist.

## 2022-09-01 NOTE — Assessment & Plan Note (Addendum)
HgbA1c has been stable. Continue Mounjaro. Was having some challenges with stock but has recently been able to get medication. EKG done with SR HR 81

## 2022-09-01 NOTE — Assessment & Plan Note (Signed)
Continue as needed alprazolam 

## 2022-09-01 NOTE — Assessment & Plan Note (Signed)
Continue statin,

## 2022-09-01 NOTE — Assessment & Plan Note (Signed)
Behavior modifications discussed and diet history reviewed.   Pt will continue to exercise regularly and modify diet with low GI, plant based foods and decrease intake of processed foods.  Recommend intake of daily multivitamin, Vitamin D, and calcium.  Recommend mammogram and colonoscopy for preventive screenings, as well as recommend immunizations that include influenza, TDAP, and Shingles  

## 2022-09-02 ENCOUNTER — Other Ambulatory Visit: Payer: Self-pay | Admitting: Hematology and Oncology

## 2022-09-02 ENCOUNTER — Encounter: Payer: Self-pay | Admitting: Hematology and Oncology

## 2022-09-02 MED ORDER — VENLAFAXINE HCL ER 75 MG PO CP24: 75.0000 mg | ORAL_CAPSULE | Freq: Every day | ORAL | 1 refills | Status: DC

## 2022-09-02 MED ORDER — GABAPENTIN 300 MG PO CAPS: 300.0000 mg | ORAL_CAPSULE | Freq: Every day | ORAL | 4 refills | Status: AC

## 2022-09-09 ENCOUNTER — Other Ambulatory Visit: Payer: Self-pay

## 2022-09-09 MED ORDER — METFORMIN HCL 500 MG PO TABS: ORAL_TABLET | ORAL | 1 refills | Status: DC

## 2022-11-08 ENCOUNTER — Other Ambulatory Visit: Payer: Self-pay | Admitting: Nurse Practitioner

## 2022-11-08 ENCOUNTER — Other Ambulatory Visit: Payer: Self-pay

## 2022-11-08 DIAGNOSIS — F419 Anxiety disorder, unspecified: Secondary | ICD-10-CM

## 2022-11-08 DIAGNOSIS — E119 Type 2 diabetes mellitus without complications: Secondary | ICD-10-CM

## 2022-11-08 MED ORDER — FREESTYLE LIBRE 14 DAY SENSOR MISC: 11 refills | Status: AC

## 2022-11-14 ENCOUNTER — Encounter: Payer: Self-pay | Admitting: Nurse Practitioner

## 2022-11-18 ENCOUNTER — Telehealth (INDEPENDENT_AMBULATORY_CARE_PROVIDER_SITE_OTHER): Payer: BC Managed Care – PPO | Admitting: Nurse Practitioner

## 2022-11-18 ENCOUNTER — Encounter: Payer: Self-pay | Admitting: Nurse Practitioner

## 2022-11-18 VITALS — BP 134/97 | HR 102 | Ht 66.0 in | Wt 176.7 lb

## 2022-11-18 DIAGNOSIS — E7800 Pure hypercholesterolemia, unspecified: Secondary | ICD-10-CM | POA: Diagnosis not present

## 2022-11-18 DIAGNOSIS — M25551 Pain in right hip: Secondary | ICD-10-CM

## 2022-11-18 DIAGNOSIS — E1151 Type 2 diabetes mellitus with diabetic peripheral angiopathy without gangrene: Secondary | ICD-10-CM | POA: Diagnosis not present

## 2022-11-18 DIAGNOSIS — I739 Peripheral vascular disease, unspecified: Secondary | ICD-10-CM

## 2022-11-18 DIAGNOSIS — M25552 Pain in left hip: Secondary | ICD-10-CM

## 2022-11-18 DIAGNOSIS — G4709 Other insomnia: Secondary | ICD-10-CM | POA: Diagnosis not present

## 2022-11-18 MED ORDER — ATORVASTATIN CALCIUM 10 MG PO TABS
10.0000 mg | ORAL_TABLET | Freq: Every day | ORAL | 0 refills | Status: DC
Start: 2022-11-18 — End: 2023-02-21

## 2022-11-18 MED ORDER — MELOXICAM 7.5 MG PO TABS
ORAL_TABLET | ORAL | 2 refills | Status: DC
Start: 2022-11-18 — End: 2023-08-05

## 2022-11-18 MED ORDER — MOUNJARO 10 MG/0.5ML ~~LOC~~ SOAJ: 10.0000 mg | SUBCUTANEOUS | 1 refills | Status: DC

## 2022-11-18 NOTE — Assessment & Plan Note (Signed)
None currently however will take meloxicam as needed.

## 2022-11-18 NOTE — Assessment & Plan Note (Signed)
Continue statin, tolerating well 

## 2022-11-18 NOTE — Assessment & Plan Note (Signed)
Continue statin, tolerating well only taking weekly

## 2022-11-18 NOTE — Assessment & Plan Note (Signed)
HgbA1c increased at last visit. Will increase dose of Mounjaro to 10 mg weekly.

## 2022-11-18 NOTE — Assessment & Plan Note (Signed)
Continue alprazolam, no significant changes with her sleep

## 2022-11-18 NOTE — Progress Notes (Signed)
Virtual Visit via MyChart   This visit type was conducted due to national recommendations for restrictions regarding the COVID-19 Pandemic (e.g. social distancing) in an effort to limit this patient's exposure and mitigate transmission in our community.  Due to her co-morbid illnesses, this patient is at least at moderate risk for complications without adequate follow up.  This format is felt to be most appropriate for this patient at this time.  All issues noted in this document were discussed and addressed.  A limited physical exam was performed with this format.    This visit type was conducted due to national recommendations for restrictions regarding the COVID-19 Pandemic (e.g. social distancing) in an effort to limit this patient's exposure and mitigate transmission in our community.  Patients identity confirmed using two different identifiers.  This format is felt to be most appropriate for this patient at this time.  All issues noted in this document were discussed and addressed.  No physical exam was performed (except for noted visual exam findings with Video Visits).    Date:  11/18/2022   ID:  Vanessa Allen, DOB [birth year], MRN [id number]  Patient Location:  Work - spoke with Vanessa Allen  Provider location:   Office    Chief Complaint:  diabetes f/u   History of Present Illness:    Vanessa Allen is a 57 y.o. female who presents via video conferencing for a telehealth visit today.    The patient does not have symptoms concerning for COVID-19 infection (fever, chills, cough, or new shortness of breath).   Virtual visit for diabetes f/u. She is currently working at [facility] so will be in town for a few more weeks.    Diabetes She presents for her follow-up diabetic visit. She has type 2 diabetes mellitus. There are no hypoglycemic associated symptoms. There are no diabetic associated symptoms. Pertinent negatives for diabetes include no chest pain, no  polydipsia, no polyphagia and no polyuria. There are no hypoglycemic complications. Symptoms are stable. Diabetic complications include PVD. Risk factors for coronary artery disease include sedentary lifestyle, diabetes mellitus and post-menopausal. Current diabetic treatment includes oral agent (dual therapy). She is compliant with treatment all of the time. She is following a generally healthy diet. When asked about meal planning, she reported none. She has not had a previous visit with a dietitian. She rarely participates in exercise. There is no change in her home blood glucose trend. (Blood sugars feels like has not been "bad") An ACE inhibitor/angiotensin II receptor blocker is not being taken. She does not see a podiatrist.Eye exam current: 08/22/2022.  Insomnia Primary symptoms: no sleep disturbance.   The onset quality is gradual. The problem occurs nightly. Past treatments include medication (belsomra - has not seen significant difference but not taking consistently). The treatment provided no relief. Prior workup: she had a home sleep study was not approved for sleep study in person.     Past Medical History:  Diagnosis Date   Arthritis    knees and back    Atypical chest pain 07/20/2018   BRCA2 gene mutation positive in female    Cancer [facility])    Right Breast Cancer   Chest discomfort 05/21/2018   Complication of anesthesia    Diabetes mellitus without complication (HCC)    type 2    Endometriosis    Family history of adverse reaction to anesthesia    brother has issues with n/v after surgery    Family history of BRCA gene mutation  Family history of breast cancer    Family history of leukemia    Family history of ovarian cancer    Family history of stomach cancer    Fibroid uterus    GERD (gastroesophageal reflux disease)    Personal history of radiation therapy    PONV (postoperative nausea and vomiting)    Pure hypercholesterolemia 04/30/2021   Tachycardia 11/13/2020    Venous insufficiency of lower extremity    Past Surgical History:  Procedure Laterality Date   APPENDECTOMY     BREAST BIOPSY Right 08/25/2019   BREAST LUMPECTOMY Right 09/23/2019   BREAST LUMPECTOMY WITH RADIOACTIVE SEED AND SENTINEL LYMPH NODE BIOPSY Right 09/23/2019   Procedure: RIGHT BREAST LUMPECTOMY WITH RADIOACTIVE SEED AND SENTINEL LYMPH NODE MAPPING;  Surgeon: Harriette Bouillon, MD;  Location: [facility] OR;  Service: General;  Laterality: Right;  PEC BLOCK   CHOLECYSTECTOMY     lsc chole cystectomy and appendectomy 1 month after myomectomy   CYST EXCISION Left 12/13/2019   Procedure: Excision left dorsal wrist ganglion cyst;  Surgeon: Allena Napoleon, MD;  Location: [facility];  Service: Plastics;  Laterality: Left;  45 min   ENDOVENOUS ABLATION SAPHENOUS VEIN W/ LASER Left 12/30/2019   endovenous laser ablation left greater saphenous vein by Cari Caraway MD    Lipoma removal Left    Left Lower Back   MYOMECTOMY     ROBOTIC ASSISTED SALPINGO OOPHERECTOMY Bilateral 05/23/2020   Procedure: XI ROBOTIC ASSISTED SALPINGO OOPHORECTOMY ;ENTEROLYSIS;  Surgeon: Carver Fila, MD;  Location: [facility];  Service: Gynecology;  Laterality: Bilateral;   WISDOM TOOTH EXTRACTION       Current Meds  Medication Sig   ACCU-CHEK GUIDE test strip USE TWICE DAILY TO CHECK BLOOD SUGAR BEFORE BREAKFAST AND DINNER   acidophilus (RISAQUAD) CAPS capsule Take 1 capsule by mouth daily.   ALPRAZolam (XANAX) 0.5 MG tablet TAKE 1 TABLET(0.5 MG) BY MOUTH DAILY AS NEEDED FOR ANXIETY   aspirin EC 81 MG tablet Take 81 mg by mouth daily. Swallow whole.   Biotin w/ Vitamins C & E (HAIR/SKIN/NAILS PO) Take 1 tablet by mouth daily.   Coenzyme Q10 (CO Q-10) 100 MG CAPS Take by mouth.   Continuous Blood Gluc Receiver (FREESTYLE LIBRE 14 DAY READER) DEVI USE TO CHECK BLOOD SUGAR BEFORE MEALS AND AT BEDTIME   Continuous Glucose Sensor (FREESTYLE LIBRE 14 DAY SENSOR) MISC USE TO MEASURE BLOOD GLUCOSE FOUR TIMES  DAILY BEFORE MEALS AND AT BEDTIME   esomeprazole (NEXIUM) 20 MG capsule TAKE 1 CAPSULE( 20MG ) BY MOUTH EVERY DAY   gabapentin (NEURONTIN) 300 MG capsule Take 1 capsule (300 mg total) by mouth at bedtime.   Glucose Blood (GLUCOSE METER TEST VI) by In Vitro route 2 (two) times daily.   metFORMIN (GLUCOPHAGE) 500 MG tablet TAKE 1 TABLET(500 MG) BY MOUTH EVERY DAY WITH BREAKFAST   Multiple Vitamin (MULTIVITAMIN WITH MINERALS) TABS tablet Take 1 tablet by mouth daily.   Omega-3 1000 MG CAPS Take 1,000 mg by mouth daily.   tamoxifen (NOLVADEX) 20 MG tablet TAKE 1 TABLET(20 MG) BY MOUTH DAILY   tirzepatide (MOUNJARO) 10 MG/0.5ML Pen Inject 10 mg into the skin once a week.   Turmeric (QC TUMERIC COMPLEX PO) Take 1,000 mg by mouth daily.   venlafaxine XR (EFFEXOR-XR) 75 MG 24 hr capsule Take 1 capsule (75 mg total) by mouth daily with breakfast.   [DISCONTINUED] atorvastatin (LIPITOR) 10 MG tablet TAKE 1 TABLET(10 MG) BY MOUTH EVERY 7 DAYS   [  DISCONTINUED] meloxicam (MOBIC) 7.5 MG tablet Take 1 tablet by mouth daily x 5 days then daily as needed   [DISCONTINUED] tirzepatide (MOUNJARO) 7.5 MG/0.5ML Pen Inject 7.5 mg into the skin once a week.     Allergies:   Patient has no known allergies.   Social History   Tobacco Use   Smoking status: Never   Smokeless tobacco: Never  Vaping Use   Vaping status: Never Used  Substance Use Topics   Alcohol use: Yes    Alcohol/week: 0.0 standard drinks of alcohol    Comment: rare    Drug use: Never     Family Hx: The patient's family history includes BRCA 1/2 in her niece; Breast cancer (age of onset: 36) in her niece; CAD in her father; Diabetes in her father; Heart disease in her father; Heart failure in her mother; Hyperlipidemia in her mother; Hypertension in her brother and mother; Leukemia in her paternal aunt; Ovarian cancer in her cousin, cousin, and cousin; Stomach cancer (age of onset: 56) in her father; Varicose Veins in her mother. There is no  history of Colon cancer, Pancreatic cancer, or Prostate cancer.  ROS:   Please see the history of present illness.    Review of Systems  Constitutional: Negative.   Respiratory: Negative.    Cardiovascular:  Negative for chest pain.  Musculoskeletal: Negative.   Neurological: Negative.   Endo/Heme/Allergies:  Negative for polydipsia and polyphagia.  Psychiatric/Behavioral: Negative.  Negative for sleep disturbance.     All other systems reviewed and are negative.   Labs/Other Tests and Data Reviewed:    Recent Labs: 08/22/2022: ALT 24; BUN 15; Creatinine, Ser 0.84; Hemoglobin 13.3; Platelets 260; Potassium 4.2; Sodium 138   Recent Lipid Panel Lab Results  Component Value Date/Time   CHOL 132 08/22/2022 10:59 AM   TRIG 126 08/22/2022 10:59 AM   HDL 42 08/22/2022 10:59 AM   CHOLHDL 3.1 08/22/2022 10:59 AM   LDLCALC 68 08/22/2022 10:59 AM    Wt Readings from Last 3 Encounters:  11/18/22 176 lb 11.2 oz (80.2 kg)  08/22/22 178 lb 3.2 oz (80.8 kg)  02/05/22 177 lb 3.2 oz (80.4 kg)     Exam:    Vital Signs:  BP (!) 134/97   Pulse (!) 102   Ht 5\' 6"  (1.676 m)   Wt 176 lb 11.2 oz (80.2 kg)   BMI 28.52 kg/m     Physical Exam Vitals reviewed.  Constitutional:      General: She is not in acute distress.    Appearance: Normal appearance.  Pulmonary:     Effort: Pulmonary effort is normal. No respiratory distress.  Neurological:     General: No focal deficit present.     Mental Status: She is alert and oriented to person, place, and time. Mental status is at baseline.     Cranial Nerves: No cranial nerve deficit.  Psychiatric:        Mood and Affect: Mood and affect normal.        Behavior: Behavior normal.        Thought Content: Thought content normal.        Cognition and Memory: Memory normal.        Judgment: Judgment normal.     ASSESSMENT & PLAN:    Type 2 diabetes mellitus with diabetic peripheral angiopathy without gangrene, without long-term current use  of insulin (HCC) Assessment & Plan: HgbA1c increased at last visit. Will increase dose of Mounjaro to 10 mg  weekly.   Orders: -     Atorvastatin Calcium; Take 1 tablet (10 mg total) by mouth daily.  Dispense: 12 tablet; Refill: 0 -     CMP14+EGFR; Future -     Hemoglobin A1c; Future  Other insomnia Assessment & Plan: Continue alprazolam, no significant changes with her sleep   Pure hypercholesterolemia Assessment & Plan: Continue statin, tolerating well only taking weekly    Orders: -     Lipid panel; Future  Bilateral hip pain Assessment & Plan: None currently however will take meloxicam as needed.   Orders: -     Meloxicam; Take 1 tablet by mouth daily x 5 days then daily as needed  Dispense: 30 tablet; Refill: 2  PVD (peripheral vascular disease) (HCC) Assessment & Plan: Continue statin, tolerating well.    Other orders -     Mounjaro; Inject 10 mg into the skin once a week.  Dispense: 6 mL; Refill: 1   COVID-19 Education: The signs and symptoms of COVID-19 were discussed with the patient and how to seek care for testing (follow up with PCP or arrange E-visit).  The importance of social distancing was discussed today.  Patient Risk:   After full review of this patients clinical status, I feel that they are at least moderate risk at this time.  Time:   Today, I have spent 9.30 minutes/ seconds with the patient with telehealth technology discussing above diagnoses.     Medication Adjustments/Labs and Tests Ordered: Current medicines are reviewed at length with the patient today.  Concerns regarding medicines are outlined above.   Tests Ordered: Orders Placed This Encounter  Procedures   CMP14+EGFR   Hemoglobin A1c   Lipid panel    Medication Changes: Meds ordered this encounter  Medications   atorvastatin (LIPITOR) 10 MG tablet    Sig: Take 1 tablet (10 mg total) by mouth daily.    Dispense:  12 tablet    Refill:  0   meloxicam (MOBIC) 7.5 MG tablet     Sig: Take 1 tablet by mouth daily x 5 days then daily as needed    Dispense:  30 tablet    Refill:  2   tirzepatide (MOUNJARO) 10 MG/0.5ML Pen    Sig: Inject 10 mg into the skin once a week.    Dispense:  6 mL    Refill:  1    Disposition:  Follow up in 3 month(s), will come next Wednesday for labs  Signed, Arnette Felts, FNP

## 2022-11-19 ENCOUNTER — Other Ambulatory Visit: Payer: BC Managed Care – PPO

## 2022-11-19 DIAGNOSIS — E1151 Type 2 diabetes mellitus with diabetic peripheral angiopathy without gangrene: Secondary | ICD-10-CM

## 2022-11-19 DIAGNOSIS — E7800 Pure hypercholesterolemia, unspecified: Secondary | ICD-10-CM | POA: Diagnosis not present

## 2022-11-20 LAB — CMP14+EGFR
ALT: 28 [IU]/L (ref 0–32)
AST: 22 [IU]/L (ref 0–40)
Albumin: 4.6 g/dL (ref 3.8–4.9)
Alkaline Phosphatase: 63 [IU]/L (ref 44–121)
BUN/Creatinine Ratio: 23 (ref 9–23)
BUN: 16 mg/dL (ref 6–24)
Bilirubin Total: 0.3 mg/dL (ref 0.0–1.2)
CO2: 21 mmol/L (ref 20–29)
Calcium: 9.7 mg/dL (ref 8.7–10.2)
Chloride: 104 mmol/L (ref 96–106)
Creatinine, Ser: 0.7 mg/dL (ref 0.57–1.00)
Globulin, Total: 2.6 g/dL (ref 1.5–4.5)
Glucose: 93 mg/dL (ref 70–99)
Potassium: 4 mmol/L (ref 3.5–5.2)
Sodium: 142 mmol/L (ref 134–144)
Total Protein: 7.2 g/dL (ref 6.0–8.5)
eGFR: 102 mL/min/{1.73_m2} (ref >59)

## 2022-11-20 LAB — HEMOGLOBIN A1C
Est. average glucose Bld gHb Est-mCnc: 140 mg/dL
Hgb A1c MFr Bld: 6.5 % — ABNORMAL HIGH (ref 4.8–5.6)

## 2022-11-20 LAB — LIPID PANEL
Chol/HDL Ratio: 3.8 ratio (ref 0.0–4.4)
Cholesterol, Total: 140 mg/dL (ref 100–199)
HDL: 37 mg/dL — ABNORMAL LOW (ref >39)
LDL Chol Calc (NIH): 72 mg/dL (ref 0–99)
Triglycerides: 182 mg/dL — ABNORMAL HIGH (ref 0–149)
VLDL Cholesterol Cal: 31 mg/dL (ref 5–40)

## 2022-12-02 DIAGNOSIS — K219 Gastro-esophageal reflux disease without esophagitis: Secondary | ICD-10-CM | POA: Diagnosis not present

## 2022-12-02 DIAGNOSIS — Z86000 Personal history of in-situ neoplasm of breast: Secondary | ICD-10-CM | POA: Diagnosis not present

## 2022-12-02 DIAGNOSIS — Z1509 Genetic susceptibility to other malignant neoplasm: Secondary | ICD-10-CM | POA: Diagnosis not present

## 2022-12-02 DIAGNOSIS — Z780 Asymptomatic menopausal state: Secondary | ICD-10-CM | POA: Diagnosis not present

## 2022-12-02 DIAGNOSIS — Z79899 Other long term (current) drug therapy: Secondary | ICD-10-CM | POA: Diagnosis not present

## 2022-12-02 DIAGNOSIS — Z7984 Long term (current) use of oral hypoglycemic drugs: Secondary | ICD-10-CM | POA: Diagnosis not present

## 2022-12-02 DIAGNOSIS — Z1231 Encounter for screening mammogram for malignant neoplasm of breast: Secondary | ICD-10-CM | POA: Diagnosis not present

## 2022-12-02 DIAGNOSIS — E7800 Pure hypercholesterolemia, unspecified: Secondary | ICD-10-CM | POA: Diagnosis not present

## 2022-12-02 DIAGNOSIS — Z8041 Family history of malignant neoplasm of ovary: Secondary | ICD-10-CM | POA: Diagnosis not present

## 2022-12-02 DIAGNOSIS — Z1501 Genetic susceptibility to malignant neoplasm of breast: Secondary | ICD-10-CM | POA: Diagnosis not present

## 2022-12-02 DIAGNOSIS — Z79810 Long term (current) use of selective estrogen receptor modulators (SERMs): Secondary | ICD-10-CM | POA: Diagnosis not present

## 2022-12-02 DIAGNOSIS — Z7982 Long term (current) use of aspirin: Secondary | ICD-10-CM | POA: Diagnosis not present

## 2022-12-02 DIAGNOSIS — Z803 Family history of malignant neoplasm of breast: Secondary | ICD-10-CM | POA: Diagnosis not present

## 2022-12-02 DIAGNOSIS — E119 Type 2 diabetes mellitus without complications: Secondary | ICD-10-CM | POA: Diagnosis not present

## 2023-01-01 DIAGNOSIS — Z803 Family history of malignant neoplasm of breast: Secondary | ICD-10-CM | POA: Diagnosis not present

## 2023-01-01 DIAGNOSIS — Z1509 Genetic susceptibility to other malignant neoplasm: Secondary | ICD-10-CM | POA: Diagnosis not present

## 2023-01-01 DIAGNOSIS — Z1501 Genetic susceptibility to malignant neoplasm of breast: Secondary | ICD-10-CM | POA: Diagnosis not present

## 2023-01-27 DIAGNOSIS — Z79810 Long term (current) use of selective estrogen receptor modulators (SERMs): Secondary | ICD-10-CM | POA: Diagnosis not present

## 2023-01-27 DIAGNOSIS — Z1501 Genetic susceptibility to malignant neoplasm of breast: Secondary | ICD-10-CM | POA: Diagnosis not present

## 2023-01-27 DIAGNOSIS — K219 Gastro-esophageal reflux disease without esophagitis: Secondary | ICD-10-CM | POA: Diagnosis not present

## 2023-01-27 DIAGNOSIS — N6032 Fibrosclerosis of left breast: Secondary | ICD-10-CM | POA: Diagnosis not present

## 2023-01-27 DIAGNOSIS — N6022 Fibroadenosis of left breast: Secondary | ICD-10-CM | POA: Diagnosis not present

## 2023-01-27 DIAGNOSIS — Z86000 Personal history of in-situ neoplasm of breast: Secondary | ICD-10-CM | POA: Diagnosis not present

## 2023-01-27 DIAGNOSIS — N642 Atrophy of breast: Secondary | ICD-10-CM | POA: Diagnosis not present

## 2023-01-27 DIAGNOSIS — Z7984 Long term (current) use of oral hypoglycemic drugs: Secondary | ICD-10-CM | POA: Diagnosis not present

## 2023-01-27 DIAGNOSIS — Z1509 Genetic susceptibility to other malignant neoplasm: Secondary | ICD-10-CM | POA: Diagnosis not present

## 2023-01-27 DIAGNOSIS — Z853 Personal history of malignant neoplasm of breast: Secondary | ICD-10-CM | POA: Diagnosis not present

## 2023-01-27 DIAGNOSIS — E119 Type 2 diabetes mellitus without complications: Secondary | ICD-10-CM | POA: Diagnosis not present

## 2023-01-27 DIAGNOSIS — Z7982 Long term (current) use of aspirin: Secondary | ICD-10-CM | POA: Diagnosis not present

## 2023-01-27 DIAGNOSIS — Z79899 Other long term (current) drug therapy: Secondary | ICD-10-CM | POA: Diagnosis not present

## 2023-01-27 DIAGNOSIS — E785 Hyperlipidemia, unspecified: Secondary | ICD-10-CM | POA: Diagnosis not present

## 2023-01-27 DIAGNOSIS — Z923 Personal history of irradiation: Secondary | ICD-10-CM | POA: Diagnosis not present

## 2023-02-06 ENCOUNTER — Inpatient Hospital Stay: Payer: BC Managed Care – PPO | Admitting: Hematology and Oncology

## 2023-02-17 ENCOUNTER — Encounter: Payer: Self-pay | Admitting: Nurse Practitioner

## 2023-02-21 ENCOUNTER — Encounter: Payer: Self-pay | Admitting: Family Medicine

## 2023-02-21 ENCOUNTER — Ambulatory Visit: Payer: BC Managed Care – PPO | Admitting: Family Medicine

## 2023-02-21 VITALS — BP 120/82 | HR 98 | Temp 98.8°F | Ht 66.0 in | Wt 174.8 lb

## 2023-02-21 DIAGNOSIS — K219 Gastro-esophageal reflux disease without esophagitis: Secondary | ICD-10-CM

## 2023-02-21 DIAGNOSIS — Z23 Encounter for immunization: Secondary | ICD-10-CM

## 2023-02-21 DIAGNOSIS — E7800 Pure hypercholesterolemia, unspecified: Secondary | ICD-10-CM | POA: Diagnosis not present

## 2023-02-21 DIAGNOSIS — E1151 Type 2 diabetes mellitus with diabetic peripheral angiopathy without gangrene: Secondary | ICD-10-CM

## 2023-02-21 MED ORDER — METFORMIN HCL 500 MG PO TABS: ORAL_TABLET | ORAL | 1 refills | Status: DC

## 2023-02-21 MED ORDER — ATORVASTATIN CALCIUM 10 MG PO TABS: 10.0000 mg | ORAL_TABLET | Freq: Every day | ORAL | 1 refills | Status: AC

## 2023-02-21 MED ORDER — MOUNJARO 10 MG/0.5ML ~~LOC~~ SOAJ: 10.0000 mg | SUBCUTANEOUS | 1 refills | Status: DC

## 2023-02-21 MED ORDER — ESOMEPRAZOLE MAGNESIUM 20 MG PO CPDR: DELAYED_RELEASE_CAPSULE | ORAL | 1 refills | Status: AC

## 2023-02-21 NOTE — Assessment & Plan Note (Signed)
Continue current tx regimen; mounjaro 10 mg SQ Q 7 days.

## 2023-02-21 NOTE — Progress Notes (Signed)
I,Vanessa Allen, CMA,acting as a Neurosurgeon for [employer], NP.,have documented all relevant documentation on the behalf of Vanessa Hose, NP,as directed by  Vanessa Hose, NP while in the presence of Vanessa Hose, NP.  Subjective:  Patient ID: Vanessa Allen , female    DOB: Nov 02, 1965 , 58 y.o.   MRN: [id number]  Chief Complaint  Patient presents with   Diabetes    HPI  Patient is a 58 year old female who presents today for  diabetes management. Patient had a lumpectomy in 07/2019 and radiation for right breast cancer, she is currently on tamoxifen for 5 years and follow up with Oncology, Dr Vanessa Allen regularly. Patient had a bilateral salpingo-oophorectomy in 2022.Patient states compliance with her medications and denies having any chest pains, headaches or shortness of breathe.She  doesn't have any questions or concerns at this time.   Diabetes She presents for her follow-up diabetic visit. She has type 2 diabetes mellitus. There are no hypoglycemic associated symptoms. Pertinent negatives for diabetes include no polydipsia, no polyphagia and no polyuria. There are no hypoglycemic complications. Symptoms are stable. Diabetic complications include PVD. Risk factors for coronary artery disease include sedentary lifestyle, diabetes mellitus and post-menopausal. Current diabetic treatment includes oral agent (dual therapy). She is compliant with treatment all of the time. She is following a generally healthy diet. When asked about meal planning, she reported none. She has not had a previous visit with a dietitian. She rarely participates in exercise. There is no change in her home blood glucose trend. Her breakfast blood glucose is taken between 9-10 am. Her breakfast blood glucose range is generally 110-130 mg/dl. (Blood sugars feels like has not been "bad") An ACE inhibitor/angiotensin II receptor blocker is not being taken. She does not see a podiatrist.Eye exam current: 08/22/2022.     Past  Medical History:  Diagnosis Date   Arthritis    knees and back    Atypical chest pain 07/20/2018   BRCA2 gene mutation positive in female    Cancer [facility])    Right Breast Cancer   Chest discomfort 05/21/2018   Complication of anesthesia    Diabetes mellitus without complication (HCC)    type 2    Endometriosis    Family history of adverse reaction to anesthesia    brother has issues with n/v after surgery    Family history of BRCA gene mutation    Family history of breast cancer    Family history of leukemia    Family history of ovarian cancer    Family history of stomach cancer    Fibroid uterus    GERD (gastroesophageal reflux disease)    Personal history of radiation therapy    PONV (postoperative nausea and vomiting)    Pure hypercholesterolemia 04/30/2021   Tachycardia 11/13/2020   Venous insufficiency of lower extremity      Family History  Problem Relation Age of Onset   Heart failure Mother    Hyperlipidemia Mother    Hypertension Mother    Varicose Veins Mother    Diabetes Father    Heart disease Father    Stomach cancer Father 56   CAD Father    Hypertension Brother    Leukemia Paternal Aunt        dx. >50   Ovarian cancer Cousin        d. <50 (maternal first cousin)   Ovarian cancer Cousin        d. <50 (maternal first cousin)   Ovarian  cancer Cousin        d. late 45s (maternal first cousin)   Breast cancer Niece 50   BRCA 1/2 Niece    Colon cancer Neg Hx    Pancreatic cancer Neg Hx    Prostate cancer Neg Hx      Current Outpatient Medications:    ACCU-CHEK GUIDE test strip, USE TWICE DAILY TO CHECK BLOOD SUGAR BEFORE BREAKFAST AND DINNER, Disp: 100 strip, Rfl: 5   acidophilus (RISAQUAD) CAPS capsule, Take 1 capsule by mouth daily., Disp: , Rfl:    ALPRAZolam (XANAX) 0.5 MG tablet, TAKE 1 TABLET(0.5 MG) BY MOUTH DAILY AS NEEDED FOR ANXIETY, Disp: 30 tablet, Rfl: 2   aspirin EC 81 MG tablet, Take 81 mg by mouth daily. Swallow whole., Disp: , Rfl:     atorvastatin (LIPITOR) 10 MG tablet, Take 1 tablet (10 mg total) by mouth daily., Disp: 12 tablet, Rfl: 0   Biotin w/ Vitamins C & E (HAIR/SKIN/NAILS PO), Take 1 tablet by mouth daily., Disp: , Rfl:    Coenzyme Q10 (CO Q-10) 100 MG CAPS, Take by mouth., Disp: , Rfl:    Continuous Blood Gluc Receiver (FREESTYLE LIBRE 14 DAY READER) DEVI, USE TO CHECK BLOOD SUGAR BEFORE MEALS AND AT BEDTIME, Disp: 1 each, Rfl: 2   Continuous Glucose Sensor (FREESTYLE LIBRE 14 DAY SENSOR) MISC, USE TO MEASURE BLOOD GLUCOSE FOUR TIMES DAILY BEFORE MEALS AND AT BEDTIME, Disp: 2 each, Rfl: 11   esomeprazole (NEXIUM) 20 MG capsule, TAKE 1 CAPSULE( 20MG ) BY MOUTH EVERY DAY, Disp: 90 capsule, Rfl: 1   gabapentin (NEURONTIN) 300 MG capsule, Take 1 capsule (300 mg total) by mouth at bedtime., Disp: 90 capsule, Rfl: 4   Glucose Blood (GLUCOSE METER TEST VI), by In Vitro route 2 (two) times daily., Disp: , Rfl:    meloxicam (MOBIC) 7.5 MG tablet, Take 1 tablet by mouth daily x 5 days then daily as needed, Disp: 30 tablet, Rfl: 2   metFORMIN (GLUCOPHAGE) 500 MG tablet, TAKE 1 TABLET(500 MG) BY MOUTH EVERY DAY WITH BREAKFAST, Disp: 90 tablet, Rfl: 1   Multiple Vitamin (MULTIVITAMIN WITH MINERALS) TABS tablet, Take 1 tablet by mouth daily., Disp: , Rfl:    Omega-3 1000 MG CAPS, Take 1,000 mg by mouth daily., Disp: , Rfl:    tamoxifen (NOLVADEX) 20 MG tablet, TAKE 1 TABLET(20 MG) BY MOUTH DAILY, Disp: 90 tablet, Rfl: 2   tirzepatide (MOUNJARO) 10 MG/0.5ML Pen, Inject 10 mg into the skin once a week., Disp: 6 mL, Rfl: 1   Turmeric (QC TUMERIC COMPLEX PO), Take 1,000 mg by mouth daily., Disp: , Rfl:    venlafaxine XR (EFFEXOR-XR) 75 MG 24 hr capsule, Take 1 capsule (75 mg total) by mouth daily with breakfast., Disp: 90 capsule, Rfl: 1   No Known Allergies   Review of Systems  Constitutional: Negative.   HENT: Negative.    Eyes: Negative.   Respiratory: Negative.    Gastrointestinal: Negative.   Endocrine: Negative for  polydipsia, polyphagia and polyuria.  Musculoskeletal: Negative.   Skin: Negative.   Neurological: Negative.   Psychiatric/Behavioral: Negative.  The patient has insomnia.      Today's Vitals   02/21/23 0840  BP: 120/82  Pulse: 98  Temp: 98.8 F (37.1 C)  TempSrc: Oral  Weight: 174 lb 12.8 oz (79.3 kg)  Height: 5\' 6"  (1.676 m)  PainSc: 0-No pain   Body mass index is 28.21 kg/m.  Wt Readings from Last 3 Encounters:  02/21/23 174  lb 12.8 oz (79.3 kg)  11/18/22 176 lb 11.2 oz (80.2 kg)  08/22/22 178 lb 3.2 oz (80.8 kg)    The 10-year ASCVD risk score (Arnett DK, et al., 2019) is: 6.5%   Values used to calculate the score:     Age: 81 years     Sex: Female     Is Non-Hispanic African American: Yes     Diabetic: Yes     Tobacco smoker: No     Systolic Blood Pressure: 120 mmHg     Is BP treated: No     HDL Cholesterol: 37 mg/dL     Total Cholesterol: 140 mg/dL  Objective:  Physical Exam HENT:     Head: Normocephalic.  Cardiovascular:     Rate and Rhythm: Normal rate and regular rhythm.  Pulmonary:     Effort: Pulmonary effort is normal.     Breath sounds: Normal breath sounds.  Abdominal:     General: Bowel sounds are normal.  Musculoskeletal:        General: Normal range of motion.  Skin:    General: Skin is warm and dry.  Neurological:     General: No focal deficit present.     Mental Status: She is alert and oriented to person, place, and time.  Psychiatric:        Mood and Affect: Mood normal.        Behavior: Behavior normal.         Assessment And Plan:  Type 2 diabetes mellitus with diabetic peripheral angiopathy without gangrene, without long-term current use of insulin (HCC) -     CBC -     CMP14+EGFR -     Hemoglobin A1c  Pure hypercholesterolemia -     Lipid panel  Immunization due -     Pneumococcal conjugate vaccine 20-valent    Return for controlled DM check-4 months.  Patient was given opportunity to ask questions. Patient  verbalized understanding of the plan and was able to repeat key elements of the plan. All questions were answered to their satisfaction.    I, Vanessa Hose, NP, have reviewed all documentation for this visit. The documentation on 02/21/2023 for the exam, diagnosis, procedures, and orders are all accurate and complete.     IF YOU HAVE BEEN REFERRED TO A SPECIALIST, IT MAY TAKE 1-2 WEEKS TO SCHEDULE/PROCESS THE REFERRAL. IF YOU HAVE NOT HEARD FROM US/SPECIALIST IN TWO WEEKS, PLEASE GIVE [place] A CALL AT [phone].

## 2023-02-21 NOTE — Assessment & Plan Note (Signed)
Goal is LDL <70; continue current treatment with Lipitor.

## 2023-02-21 NOTE — Patient Instructions (Signed)

## 2023-02-22 LAB — CMP14+EGFR
ALT: 32 [IU]/L (ref 0–32)
AST: 27 [IU]/L (ref 0–40)
Albumin: 4.6 g/dL (ref 3.8–4.9)
Alkaline Phosphatase: 74 [IU]/L (ref 44–121)
BUN/Creatinine Ratio: 20 (ref 9–23)
BUN: 17 mg/dL (ref 6–24)
Bilirubin Total: 0.4 mg/dL (ref 0.0–1.2)
CO2: 23 mmol/L (ref 20–29)
Calcium: 9.7 mg/dL (ref 8.7–10.2)
Chloride: 103 mmol/L (ref 96–106)
Creatinine, Ser: 0.84 mg/dL (ref 0.57–1.00)
Globulin, Total: 2.6 g/dL (ref 1.5–4.5)
Glucose: 98 mg/dL (ref 70–99)
Potassium: 4 mmol/L (ref 3.5–5.2)
Sodium: 143 mmol/L (ref 134–144)
Total Protein: 7.2 g/dL (ref 6.0–8.5)
eGFR: 82 mL/min/{1.73_m2} (ref >59)

## 2023-02-22 LAB — LIPID PANEL
Chol/HDL Ratio: 3 {ratio} (ref 0.0–4.4)
Cholesterol, Total: 161 mg/dL (ref 100–199)
HDL: 53 mg/dL (ref >39)
LDL Chol Calc (NIH): 95 mg/dL (ref 0–99)
Triglycerides: 67 mg/dL (ref 0–149)
VLDL Cholesterol Cal: 13 mg/dL (ref 5–40)

## 2023-02-22 LAB — CBC
Hematocrit: 36.9 % (ref 34.0–46.6)
Hemoglobin: 12.1 g/dL (ref 11.1–15.9)
MCH: 27.3 pg (ref 26.6–33.0)
MCHC: 32.8 g/dL (ref 31.5–35.7)
MCV: 83 fL (ref 79–97)
Platelets: 310 10*3/uL (ref 150–450)
RBC: 4.44 x10E6/uL (ref 3.77–5.28)
RDW: 13 % (ref 11.7–15.4)
WBC: 4.8 10*3/uL (ref 3.4–10.8)

## 2023-02-22 LAB — HEMOGLOBIN A1C
Est. average glucose Bld gHb Est-mCnc: 137 mg/dL
Hgb A1c MFr Bld: 6.4 % — ABNORMAL HIGH (ref 4.8–5.6)

## 2023-02-27 ENCOUNTER — Other Ambulatory Visit: Payer: Self-pay | Admitting: *Deleted

## 2023-03-04 ENCOUNTER — Encounter: Payer: Self-pay | Admitting: Family Medicine

## 2023-03-24 ENCOUNTER — Other Ambulatory Visit: Payer: Self-pay | Admitting: *Deleted

## 2023-03-24 MED ORDER — TAMOXIFEN CITRATE 20 MG PO TABS: ORAL_TABLET | ORAL | 2 refills | Status: AC

## 2023-03-24 MED ORDER — TAMOXIFEN CITRATE 20 MG PO TABS: ORAL_TABLET | ORAL | 2 refills | Status: DC

## 2023-04-11 ENCOUNTER — Other Ambulatory Visit: Payer: Self-pay

## 2023-04-11 MED ORDER — VENLAFAXINE HCL ER 75 MG PO CP24: 75.0000 mg | ORAL_CAPSULE | Freq: Every day | ORAL | 1 refills | Status: AC

## 2023-05-27 ENCOUNTER — Other Ambulatory Visit: Payer: Self-pay | Admitting: Nurse Practitioner

## 2023-05-27 DIAGNOSIS — F419 Anxiety disorder, unspecified: Secondary | ICD-10-CM

## 2023-05-27 MED ORDER — ALPRAZOLAM 0.5 MG PO TABS: ORAL_TABLET | ORAL | 0 refills | Status: DC

## 2023-06-02 DIAGNOSIS — Z780 Asymptomatic menopausal state: Secondary | ICD-10-CM | POA: Diagnosis not present

## 2023-06-02 DIAGNOSIS — Z7984 Long term (current) use of oral hypoglycemic drugs: Secondary | ICD-10-CM | POA: Diagnosis not present

## 2023-06-02 DIAGNOSIS — K769 Liver disease, unspecified: Secondary | ICD-10-CM | POA: Diagnosis not present

## 2023-06-02 DIAGNOSIS — Z86000 Personal history of in-situ neoplasm of breast: Secondary | ICD-10-CM | POA: Diagnosis not present

## 2023-06-02 DIAGNOSIS — Z79899 Other long term (current) drug therapy: Secondary | ICD-10-CM | POA: Diagnosis not present

## 2023-06-02 DIAGNOSIS — E7800 Pure hypercholesterolemia, unspecified: Secondary | ICD-10-CM | POA: Diagnosis not present

## 2023-06-02 DIAGNOSIS — E119 Type 2 diabetes mellitus without complications: Secondary | ICD-10-CM | POA: Diagnosis not present

## 2023-06-02 DIAGNOSIS — Z7982 Long term (current) use of aspirin: Secondary | ICD-10-CM | POA: Diagnosis not present

## 2023-06-02 DIAGNOSIS — Z803 Family history of malignant neoplasm of breast: Secondary | ICD-10-CM | POA: Diagnosis not present

## 2023-06-02 DIAGNOSIS — Z08 Encounter for follow-up examination after completed treatment for malignant neoplasm: Secondary | ICD-10-CM | POA: Diagnosis not present

## 2023-06-02 DIAGNOSIS — Z1509 Genetic susceptibility to other malignant neoplasm: Secondary | ICD-10-CM | POA: Diagnosis not present

## 2023-06-02 DIAGNOSIS — Z7985 Long-term (current) use of injectable non-insulin antidiabetic drugs: Secondary | ICD-10-CM | POA: Diagnosis not present

## 2023-06-02 DIAGNOSIS — Z1501 Genetic susceptibility to malignant neoplasm of breast: Secondary | ICD-10-CM | POA: Diagnosis not present

## 2023-06-02 DIAGNOSIS — Z79810 Long term (current) use of selective estrogen receptor modulators (SERMs): Secondary | ICD-10-CM | POA: Diagnosis not present

## 2023-06-02 DIAGNOSIS — Z9011 Acquired absence of right breast and nipple: Secondary | ICD-10-CM | POA: Diagnosis not present

## 2023-06-25 ENCOUNTER — Ambulatory Visit: Payer: BC Managed Care – PPO | Admitting: Nurse Practitioner

## 2023-06-25 ENCOUNTER — Encounter: Payer: Self-pay | Admitting: Nurse Practitioner

## 2023-06-25 VITALS — BP 122/70 | HR 104 | Temp 98.9°F | Ht 66.0 in | Wt 173.2 lb

## 2023-06-25 DIAGNOSIS — Z23 Encounter for immunization: Secondary | ICD-10-CM

## 2023-06-25 DIAGNOSIS — E663 Overweight: Secondary | ICD-10-CM | POA: Diagnosis not present

## 2023-06-25 DIAGNOSIS — C50911 Malignant neoplasm of unspecified site of right female breast: Secondary | ICD-10-CM | POA: Diagnosis not present

## 2023-06-25 DIAGNOSIS — Z1501 Genetic susceptibility to malignant neoplasm of breast: Secondary | ICD-10-CM

## 2023-06-25 DIAGNOSIS — E7800 Pure hypercholesterolemia, unspecified: Secondary | ICD-10-CM | POA: Diagnosis not present

## 2023-06-25 DIAGNOSIS — E1151 Type 2 diabetes mellitus with diabetic peripheral angiopathy without gangrene: Secondary | ICD-10-CM | POA: Diagnosis not present

## 2023-06-25 DIAGNOSIS — Z6827 Body mass index (BMI) 27.0-27.9, adult: Secondary | ICD-10-CM

## 2023-06-25 MED ORDER — MOUNJARO 10 MG/0.5ML ~~LOC~~ SOAJ: 10.0000 mg | SUBCUTANEOUS | 1 refills | Status: DC

## 2023-06-25 NOTE — Progress Notes (Signed)
 [place] Favia, CMA,acting as a Neurosurgeon for Susanna Epley, FNP.,have documented all relevant documentation on the behalf of Susanna Epley, FNP,as directed by  Susanna Epley, FNP while in the presence of Susanna Epley, FNP.  Subjective:  Patient ID: Vanessa Allen , female    DOB: 1965-03-23 , 58 y.o.   MRN: [id number]  Chief Complaint  Patient presents with   Diabetes    Patient presents today for a chol and dm follow up, Patient reports compliance with medication. Patient denies any chest pain, SOB, or headaches. Patient has no concerns today.     HPI  Diabetes She presents for her follow-up diabetic visit. She has type 2 diabetes mellitus. There are no hypoglycemic associated symptoms. There are no diabetic associated symptoms. Pertinent negatives for diabetes include no chest pain, no polydipsia, no polyphagia and no polyuria. There are no hypoglycemic complications. Symptoms are stable. Diabetic complications include PVD. Risk factors for coronary artery disease include sedentary lifestyle, diabetes mellitus and post-menopausal. Current diabetic treatment includes oral agent (dual therapy). She is compliant with treatment all of the time. She is following a generally healthy diet. When asked about meal planning, she reported none. She has not had a previous visit with a dietitian. She rarely participates in exercise. There is no change in her home blood glucose trend. (Blood sugars feels like has not been "bad") An ACE inhibitor/angiotensin II receptor blocker is not being taken. She does not see a podiatrist.Eye exam current: 08/22/2022.  Insomnia Primary symptoms: no sleep disturbance.   The onset quality is gradual. The problem occurs nightly. Past treatments include medication (belsomra  - has not seen significant difference but not taking consistently). The treatment provided no relief. Prior workup: she had a home sleep study was not approved for sleep study in person.     Past Medical  History:  Diagnosis Date   Arthritis    knees and back    Atypical chest pain 07/20/2018   BRCA2 gene mutation positive in female    Cancer [facility])    Right Breast Cancer   Chest discomfort 05/21/2018   Complication of anesthesia    Diabetes mellitus without complication (HCC)    type 2    Endometriosis    Family history of adverse reaction to anesthesia    brother has issues with n/v after surgery    Family history of BRCA gene mutation    Family history of breast cancer    Family history of leukemia    Family history of ovarian cancer    Family history of stomach cancer    Fibroid uterus    GERD (gastroesophageal reflux disease)    Personal history of radiation therapy    PONV (postoperative nausea and vomiting)    Pure hypercholesterolemia 04/30/2021   Tachycardia 11/13/2020   Venous insufficiency of lower extremity      Family History  Problem Relation Age of Onset   Heart failure Mother    Hyperlipidemia Mother    Hypertension Mother    Varicose Veins Mother    Diabetes Father    Heart disease Father    Stomach cancer Father 72   CAD Father    Hypertension Brother    Leukemia Paternal Aunt        dx. >50   Ovarian cancer Cousin        d. <50 (maternal first cousin)   Ovarian cancer Cousin        d. <50 (maternal first cousin)   Ovarian cancer  Cousin        d. late 48s (maternal first cousin)   Breast cancer Niece 31   BRCA 1/2 Niece    Colon cancer Neg Hx    Pancreatic cancer Neg Hx    Prostate cancer Neg Hx      Current Outpatient Medications:    ACCU-CHEK GUIDE test strip, USE TWICE DAILY TO CHECK BLOOD SUGAR BEFORE BREAKFAST AND DINNER, Disp: 100 strip, Rfl: 5   acidophilus (RISAQUAD) CAPS capsule, Take 1 capsule by mouth daily., Disp: , Rfl:    ALPRAZolam  (XANAX ) 0.5 MG tablet, TAKE 1 TABLET(0.5 MG) BY MOUTH DAILY AS NEEDED FOR ANXIETY, Disp: 30 tablet, Rfl: 0   aspirin EC 81 MG tablet, Take 81 mg by mouth daily. Swallow whole., Disp: , Rfl:     atorvastatin  (LIPITOR) 10 MG tablet, Take 1 tablet (10 mg total) by mouth daily., Disp: 12 tablet, Rfl: 1   Biotin w/ Vitamins C & E (HAIR/SKIN/NAILS PO), Take 1 tablet by mouth daily., Disp: , Rfl:    Coenzyme Q10 (CO Q-10) 100 MG CAPS, Take by mouth., Disp: , Rfl:    Continuous Blood Gluc Receiver (FREESTYLE LIBRE 14 DAY READER) DEVI, USE TO CHECK BLOOD SUGAR BEFORE MEALS AND AT BEDTIME, Disp: 1 each, Rfl: 2   Continuous Glucose Sensor (FREESTYLE LIBRE 14 DAY SENSOR) MISC, USE TO MEASURE BLOOD GLUCOSE FOUR TIMES DAILY BEFORE MEALS AND AT BEDTIME, Disp: 2 each, Rfl: 11   esomeprazole  (NEXIUM ) 20 MG capsule, TAKE 1 CAPSULE( 20MG ) BY MOUTH EVERY DAY, Disp: 90 capsule, Rfl: 1   gabapentin  (NEURONTIN ) 300 MG capsule, Take 1 capsule (300 mg total) by mouth at bedtime., Disp: 90 capsule, Rfl: 4   Glucose Blood (GLUCOSE METER TEST VI), by In Vitro route 2 (two) times daily., Disp: , Rfl:    meloxicam  (MOBIC ) 7.5 MG tablet, Take 1 tablet by mouth daily x 5 days then daily as needed, Disp: 30 tablet, Rfl: 2   metFORMIN  (GLUCOPHAGE ) 500 MG tablet, TAKE 1 TABLET(500 MG) BY MOUTH EVERY DAY WITH BREAKFAST, Disp: 90 tablet, Rfl: 1   Multiple Vitamin (MULTIVITAMIN WITH MINERALS) TABS tablet, Take 1 tablet by mouth daily., Disp: , Rfl:    Omega-3 1000 MG CAPS, Take 1,000 mg by mouth daily., Disp: , Rfl:    tamoxifen  (NOLVADEX ) 20 MG tablet, TAKE 1 TABLET(20 MG) BY MOUTH DAILY, Disp: 90 tablet, Rfl: 2   Turmeric (QC TUMERIC COMPLEX PO), Take 1,000 mg by mouth daily., Disp: , Rfl:    venlafaxine  XR (EFFEXOR -XR) 75 MG 24 hr capsule, Take 1 capsule (75 mg total) by mouth daily with breakfast., Disp: 90 capsule, Rfl: 1   tirzepatide  (MOUNJARO ) 10 MG/0.5ML Pen, Inject 10 mg into the skin once a week., Disp: 6 mL, Rfl: 1   No Known Allergies   Review of Systems  Constitutional: Negative.   Respiratory: Negative.    Cardiovascular: Negative.  Negative for chest pain, palpitations and leg swelling.  Endocrine:  Negative for polydipsia, polyphagia and polyuria.  Neurological: Negative.   Psychiatric/Behavioral: Negative.  Negative for sleep disturbance. The patient has insomnia.      Today's Vitals   06/25/23 1619  BP: 122/70  Pulse: (!) 104  Temp: 98.9 F (37.2 C)  TempSrc: Oral  Weight: 173 lb 3.2 oz (78.6 kg)  Height: 5\' 6"  (1.676 m)  PainSc: 0-No pain   Body mass index is 27.96 kg/m.  Wt Readings from Last 3 Encounters:  06/25/23 173 lb 3.2 oz (78.6 kg)  02/21/23 174 lb 12.8 oz (79.3 kg)  11/18/22 176 lb 11.2 oz (80.2 kg)      Objective:  Physical Exam Vitals and nursing note reviewed.  Constitutional:      General: She is not in acute distress.    Appearance: Normal appearance.  Cardiovascular:     Rate and Rhythm: Normal rate and regular rhythm.     Pulses: Normal pulses.     Heart sounds: Normal heart sounds. No murmur heard. Pulmonary:     Effort: Pulmonary effort is normal. No respiratory distress.     Breath sounds: Normal breath sounds. No wheezing.  Skin:    General: Skin is warm and dry.     Capillary Refill: Capillary refill takes less than 2 seconds.     Comments: Darkened medial aspect left lower extremity  Neurological:     General: No focal deficit present.     Mental Status: She is alert and oriented to person, place, and time.     Cranial Nerves: No cranial nerve deficit.     Motor: No weakness.  Psychiatric:        Mood and Affect: Mood normal.        Behavior: Behavior normal.        Thought Content: Thought content normal.        Judgment: Judgment normal.      Assessment And Plan:  Type 2 diabetes mellitus with diabetic peripheral angiopathy without gangrene, without long-term current use of insulin (HCC) Assessment & Plan: HgbA1c stable. At this time continue GLP1 and will contact Endocrinology to inquire on if she should continue with history of BRCA1 positive breast cancer.   Orders: -     Hemoglobin A1c -     Mounjaro ; Inject 10 mg  into the skin once a week.  Dispense: 6 mL; Refill: 1 -     BMP8+eGFR  Pure hypercholesterolemia Assessment & Plan: Continue statin, tolerating well only taking weekly    Orders: -     Lipid panel  Breast cancer, BRCA2 positive, right (HCC) Assessment & Plan: She received a letter from the genetic provider office encouraging to be screened for pancreatic cancer. Will find out more information on next steps. I have also advised her to contact her GI provider to see if she needs to be screened again for colon cancer.    COVID-19 vaccine administered Assessment & Plan: Covid 19 vaccine given in office observed for 15 minutes without any adverse reaction   Orders: Best boy Vaccine 76yrs & older  Overweight with body mass index (BMI) of 27 to 27.9 in adult    Return for controlled DM check 4 months.  Patient was given opportunity to ask questions. Patient verbalized understanding of the plan and was able to repeat key elements of the plan. All questions were answered to their satisfaction.    Inge Mangle, FNP, have reviewed all documentation for this visit. The documentation on 06/25/23 for the exam, diagnosis, procedures, and orders are all accurate and complete.   IF YOU HAVE BEEN REFERRED TO A SPECIALIST, IT MAY TAKE 1-2 WEEKS TO SCHEDULE/PROCESS THE REFERRAL. IF YOU HAVE NOT HEARD FROM US /SPECIALIST IN TWO WEEKS, PLEASE GIVE US  A CALL AT [phone].

## 2023-06-26 LAB — BMP8+EGFR
BUN/Creatinine Ratio: 13 (ref 9–23)
BUN: 12 mg/dL (ref 6–24)
CO2: 23 mmol/L (ref 20–29)
Calcium: 9.5 mg/dL (ref 8.7–10.2)
Chloride: 104 mmol/L (ref 96–106)
Creatinine, Ser: 0.89 mg/dL (ref 0.57–1.00)
Glucose: 95 mg/dL (ref 70–99)
Potassium: 4.4 mmol/L (ref 3.5–5.2)
Sodium: 142 mmol/L (ref 134–144)
eGFR: 77 mL/min/{1.73_m2} (ref >59)

## 2023-06-26 LAB — HEMOGLOBIN A1C
Est. average glucose Bld gHb Est-mCnc: 126 mg/dL
Hgb A1c MFr Bld: 6 % — ABNORMAL HIGH (ref 4.8–5.6)

## 2023-06-26 LAB — LIPID PANEL
Chol/HDL Ratio: 3 ratio (ref 0.0–4.4)
Cholesterol, Total: 122 mg/dL (ref 100–199)
HDL: 41 mg/dL (ref >39)
LDL Chol Calc (NIH): 53 mg/dL (ref 0–99)
Triglycerides: 166 mg/dL — ABNORMAL HIGH (ref 0–149)
VLDL Cholesterol Cal: 28 mg/dL (ref 5–40)

## 2023-06-28 ENCOUNTER — Ambulatory Visit: Payer: Self-pay | Admitting: Nurse Practitioner

## 2023-06-28 DIAGNOSIS — Z23 Encounter for immunization: Secondary | ICD-10-CM | POA: Insufficient documentation

## 2023-06-28 NOTE — Assessment & Plan Note (Signed)
 Covid 19 vaccine given in office observed for 15 minutes without any adverse reaction

## 2023-06-28 NOTE — Assessment & Plan Note (Signed)
 HgbA1c stable. At this time continue GLP1 and will contact Endocrinology to inquire on if she should continue with history of BRCA1 positive breast cancer.

## 2023-06-28 NOTE — Assessment & Plan Note (Signed)
Continue statin, tolerating well only taking weekly

## 2023-06-28 NOTE — Assessment & Plan Note (Addendum)
 She received a letter from the genetic provider office encouraging to be screened for pancreatic cancer. Will find out more information on next steps. I have also advised her to contact her GI provider to see if she needs to be screened again for colon cancer.

## 2023-08-05 ENCOUNTER — Other Ambulatory Visit: Payer: Self-pay

## 2023-08-05 DIAGNOSIS — M25551 Pain in right hip: Secondary | ICD-10-CM

## 2023-08-05 MED ORDER — MELOXICAM 7.5 MG PO TABS: ORAL_TABLET | ORAL | 2 refills | Status: AC

## 2023-08-20 ENCOUNTER — Other Ambulatory Visit: Payer: Self-pay | Admitting: Family Medicine

## 2023-09-22 ENCOUNTER — Other Ambulatory Visit: Payer: Self-pay | Admitting: Nurse Practitioner

## 2023-09-22 DIAGNOSIS — F419 Anxiety disorder, unspecified: Secondary | ICD-10-CM

## 2023-10-27 ENCOUNTER — Ambulatory Visit: Admitting: Nurse Practitioner

## 2023-10-27 ENCOUNTER — Encounter: Payer: Self-pay | Admitting: Nurse Practitioner

## 2023-10-29 ENCOUNTER — Ambulatory Visit: Payer: Self-pay | Admitting: Nurse Practitioner

## 2023-10-29 ENCOUNTER — Encounter: Payer: Self-pay | Admitting: Nurse Practitioner

## 2023-10-29 VITALS — BP 110/80 | HR 88 | Temp 98.4°F | Ht 66.0 in | Wt 172.8 lb

## 2023-10-29 DIAGNOSIS — Z23 Encounter for immunization: Secondary | ICD-10-CM

## 2023-10-29 DIAGNOSIS — G4709 Other insomnia: Secondary | ICD-10-CM

## 2023-10-29 DIAGNOSIS — E1151 Type 2 diabetes mellitus with diabetic peripheral angiopathy without gangrene: Secondary | ICD-10-CM

## 2023-10-29 DIAGNOSIS — E7800 Pure hypercholesterolemia, unspecified: Secondary | ICD-10-CM

## 2023-10-29 DIAGNOSIS — Z139 Encounter for screening, unspecified: Secondary | ICD-10-CM

## 2023-10-29 MED ORDER — ESZOPICLONE 2 MG PO TABS: 2.0000 mg | ORAL_TABLET | Freq: Every day | ORAL | 5 refills | Status: AC

## 2023-10-29 NOTE — Progress Notes (Signed)
 LILLETTE Kristeen JINNY Gladis, CMA,acting as a Neurosurgeon for Gaines Ada, FNP.,have documented all relevant documentation on the behalf of Gaines Ada, FNP,as directed by  Gaines Ada, FNP while in the presence of Gaines Ada, FNP.  Subjective:  Patient ID: Vanessa Allen , female    DOB: December 27, 1965 , 58 y.o.   MRN: [id number]  Chief Complaint  Patient presents with   Diabetes    Patient presents today for a CHOL and dm follow up, Patient reports compliance with medication. Patient denies any chest pain, SOB, or headaches. Patient has no concerns today.    Discussed the use of AI scribe software for clinical note transcription with the patient, who gave verbal consent to proceed.  History of Present Illness Vanessa Allen is a 58 year old female with diabetes who presents for a follow-up visit.  She has not been checking her blood sugar levels recently due to a busy work schedule, which requires her to start work early in the morning. 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 She recently refilled her prescription for Longera, which is part of her diabetes management regimen.  She is experiencing ongoing issues with sleep and has tried multiple medications without success, including Belsomra , Bebigo, temazepam , trazodone , and mirtazapine. She has not yet tried Lunesta, which was suggested by a Radio broadcast assistant. Her sleep difficulties impact her daily functioning.  She has a history of cancer and was under the care of an oncologist but has not seen her since December of last year due to surgery. She is trying to reconnect with her oncologist for follow-up and further testing. Her last radiation treatment was in 2022.  Diabetes She presents for her follow-up diabetic visit. She has type 2 diabetes mellitus. There are no hypoglycemic associated symptoms. There are no diabetic associated symptoms. Pertinent negatives for diabetes  include no chest pain, no polydipsia, no polyphagia and no polyuria. There are no hypoglycemic complications. Symptoms are stable. Diabetic complications include PVD. Risk factors for coronary artery disease include sedentary lifestyle, diabetes mellitus and post-menopausal. Current diabetic treatment includes oral agent (dual therapy). She is compliant with treatment all of the time. She is following a generally healthy diet. When asked about meal planning, she reported none. She has not had a previous visit with a dietitian. She rarely participates in exercise. There is no change in her home blood glucose trend. (She has not been checking regularly) An ACE inhibitor/angiotensin II receptor blocker is not being taken. She does not see a podiatrist.Eye exam current: 08/22/2022.  Insomnia Primary symptoms: no sleep disturbance.   The onset quality is gradual. The problem occurs nightly. Past treatments include medication (belsomra  - has not seen significant difference but not taking consistently). The treatment provided no relief. Prior workup: she had a home sleep study was not approved for sleep study in person.     Past Medical History:  Diagnosis Date   Arthritis    knees and back    Atypical chest pain 07/20/2018   BRCA2 gene mutation positive in female    Cancer [facility])    Right Breast Cancer   Cataract    Chest discomfort 05/21/2018   Complication of anesthesia    Diabetes mellitus without complication (HCC)    type 2    Endometriosis    Family history of adverse reaction to anesthesia    brother has issues with n/v after surgery    Family history of BRCA gene  mutation    Family history of breast cancer    Family history of leukemia    Family history of ovarian cancer    Family history of stomach cancer    Fibroid uterus    GERD (gastroesophageal reflux disease)    Personal history of radiation therapy    PONV (postoperative nausea and vomiting)    Pure hypercholesterolemia  04/30/2021   Sleep apnea    Tachycardia 11/13/2020   Venous insufficiency of lower extremity      Family History  Problem Relation Age of Onset   Heart failure Mother    Hyperlipidemia Mother    Hypertension Mother    Varicose Veins Mother    Arthritis Mother    Diabetes Father    Heart disease Father    Stomach cancer Father 22   CAD Father    Cancer Father    Hypertension Brother    Leukemia Paternal Aunt        dx. >50   Ovarian cancer Cousin        d. <50 (maternal first cousin)   Ovarian cancer Cousin        d. <50 (maternal first cousin)   Ovarian cancer Cousin        d. late 55s (maternal first cousin)   Breast cancer Niece 61   BRCA 1/2 Niece    Colon cancer Neg Hx    Pancreatic cancer Neg Hx    Prostate cancer Neg Hx      Current Outpatient Medications:    ACCU-CHEK GUIDE test strip, USE TWICE DAILY TO CHECK BLOOD SUGAR BEFORE BREAKFAST AND DINNER, Disp: 100 strip, Rfl: 5   acidophilus (RISAQUAD) CAPS capsule, Take 1 capsule by mouth daily., Disp: , Rfl:    ALPRAZolam  (XANAX ) 0.5 MG tablet, TAKE 1 TABLET(0.5 MG) BY MOUTH DAILY AS NEEDED FOR ANXIETY, Disp: 30 tablet, Rfl: 2   aspirin EC 81 MG tablet, Take 81 mg by mouth daily. Swallow whole., Disp: , Rfl:    atorvastatin  (LIPITOR) 10 MG tablet, Take 1 tablet (10 mg total) by mouth daily., Disp: 12 tablet, Rfl: 1   Biotin w/ Vitamins C & E (HAIR/SKIN/NAILS PO), Take 1 tablet by mouth daily., Disp: , Rfl:    Coenzyme Q10 (CO Q-10) 100 MG CAPS, Take by mouth., Disp: , Rfl:    Continuous Blood Gluc Receiver (FREESTYLE LIBRE 14 DAY READER) DEVI, USE TO CHECK BLOOD SUGAR BEFORE MEALS AND AT BEDTIME, Disp: 1 each, Rfl: 2   Continuous Glucose Sensor (FREESTYLE LIBRE 14 DAY SENSOR) MISC, USE TO MEASURE BLOOD GLUCOSE FOUR TIMES DAILY BEFORE MEALS AND AT BEDTIME, Disp: 2 each, Rfl: 11   esomeprazole  (NEXIUM ) 20 MG capsule, TAKE 1 CAPSULE( 20MG ) BY MOUTH EVERY DAY, Disp: 90 capsule, Rfl: 1   eszopiclone (LUNESTA) 2 MG TABS  tablet, Take 1 tablet (2 mg total) by mouth at bedtime. Take immediately before bedtime, Disp: 30 tablet, Rfl: 5   gabapentin  (NEURONTIN ) 300 MG capsule, Take 1 capsule (300 mg total) by mouth at bedtime., Disp: 90 capsule, Rfl: 4   Glucose Blood (GLUCOSE METER TEST VI), by In Vitro route 2 (two) times daily., Disp: , Rfl:    meloxicam  (MOBIC ) 7.5 MG tablet, Take 1 tablet by mouth daily x 5 days then daily as needed, Disp: 30 tablet, Rfl: 2   metFORMIN  (GLUCOPHAGE ) 500 MG tablet, TAKE 1 TABLET(500 MG) BY MOUTH EVERY DAY WITH BREAKFAST, Disp: 90 tablet, Rfl: 1   Multiple Vitamin (MULTIVITAMIN WITH MINERALS) TABS  tablet, Take 1 tablet by mouth daily., Disp: , Rfl:    Omega-3 1000 MG CAPS, Take 1,000 mg by mouth daily., Disp: , Rfl:    tamoxifen  (NOLVADEX ) 20 MG tablet, TAKE 1 TABLET(20 MG) BY MOUTH DAILY, Disp: 90 tablet, Rfl: 2   Turmeric (QC TUMERIC COMPLEX PO), Take 1,000 mg by mouth daily., Disp: , Rfl:    venlafaxine  XR (EFFEXOR -XR) 75 MG 24 hr capsule, Take 1 capsule (75 mg total) by mouth daily with breakfast., Disp: 90 capsule, Rfl: 1   tirzepatide  (MOUNJARO ) 12.5 MG/0.5ML Pen, Inject 12.5 mg into the skin once a week., Disp: 6 mL, Rfl: 1   No Known Allergies   Review of Systems  Constitutional: Negative.   Respiratory: Negative.    Cardiovascular:  Negative for chest pain, palpitations and leg swelling.  Endocrine: Negative for polydipsia, polyphagia and polyuria.  Neurological: Negative.   Psychiatric/Behavioral: Negative.  Negative for sleep disturbance. The patient has insomnia.      Today's Vitals   10/29/23 1553  BP: 110/80  Pulse: 88  Temp: 98.4 F (36.9 C)  TempSrc: Oral  Weight: 172 lb 12.8 oz (78.4 kg)  Height: 5' 6 (1.676 m)  PainSc: 0-No pain   Body mass index is 27.89 kg/m.  Wt Readings from Last 3 Encounters:  10/29/23 172 lb 12.8 oz (78.4 kg)  06/25/23 173 lb 3.2 oz (78.6 kg)  02/21/23 174 lb 12.8 oz (79.3 kg)      Objective:  Physical Exam Vitals  and nursing note reviewed.  Constitutional:      General: She is not in acute distress.    Appearance: Normal appearance.  Cardiovascular:     Rate and Rhythm: Normal rate and regular rhythm.     Pulses: Normal pulses.     Heart sounds: Normal heart sounds. No murmur heard. Pulmonary:     Effort: Pulmonary effort is normal. No respiratory distress.     Breath sounds: Normal breath sounds. No wheezing.  Skin:    General: Skin is warm and dry.     Capillary Refill: Capillary refill takes less than 2 seconds.     Comments: Darkened medial aspect left lower extremity  Neurological:     General: No focal deficit present.     Mental Status: She is alert and oriented to person, place, and time.     Cranial Nerves: No cranial nerve deficit.     Motor: No weakness.  Psychiatric:        Mood and Affect: Mood normal.        Behavior: Behavior normal.        Thought Content: Thought content normal.        Judgment: Judgment normal.      Assessment And Plan:  Type 2 diabetes mellitus with diabetic peripheral angiopathy without gangrene, without long-term current use of insulin (HCC) Assessment & Plan: Suboptimal blood glucose monitoring due to work schedule constraints. - Encourage regular blood glucose monitoring. - Reschedule eye exam before year-end. - Check A1c. - Perform annual urine microalbumin test; provide cup for sample collectio  Orders: -     Hemoglobin A1c -     BMP8+eGFR -     Microalbumin / creatinine urine ratio  Pure hypercholesterolemia Assessment & Plan: Cholesterol levels previously noted to have increased. - Continue current cholesterol management plan. - Monitor cholesterol levels regularly.  Orders: -     Lipid panel -     BMP8+eGFR  Need for influenza vaccination -  Flu vaccine trivalent PF, 6mos and older(Flulaval,Afluria,Fluarix,Fluzone)  Encounter for screening -     Hepatitis B surface antibody,qualitative  Other insomnia Assessment &  Plan: Chronic insomnia with previous unsuccessful trials of Belsomra , Bebigo, temazepam , trazodone , and mirtazapine. Discussed potential trial of Lunesta. - Consider trial of Lunesta for insomnia.  Orders: -     Eszopiclone; Take 1 tablet (2 mg total) by mouth at bedtime. Take immediately before bedtime  Dispense: 30 tablet; Refill: 5   Return for controlled DM check 4 months.  Patient was given opportunity to ask questions. Patient verbalized understanding of the plan and was able to repeat key elements of the plan. All questions were answered to their satisfaction.   LILLETTE Gaines Ada, FNP, have reviewed all documentation for this visit. The documentation on 10/29/23 for the exam, diagnosis, procedures, and orders are all accurate and complete.    IF YOU HAVE BEEN REFERRED TO A SPECIALIST, IT MAY TAKE 1-2 WEEKS TO SCHEDULE/PROCESS THE REFERRAL. IF YOU HAVE NOT HEARD FROM US /SPECIALIST IN TWO WEEKS, PLEASE GIVE US  A CALL AT [phone].

## 2023-10-30 ENCOUNTER — Ambulatory Visit: Payer: Self-pay | Admitting: Nurse Practitioner

## 2023-10-30 ENCOUNTER — Encounter: Payer: Self-pay | Admitting: Nurse Practitioner

## 2023-10-30 DIAGNOSIS — E1151 Type 2 diabetes mellitus with diabetic peripheral angiopathy without gangrene: Secondary | ICD-10-CM | POA: Diagnosis not present

## 2023-10-30 LAB — LIPID PANEL
Chol/HDL Ratio: 3.6 ratio (ref 0.0–4.4)
Cholesterol, Total: 145 mg/dL (ref 100–199)
HDL: 40 mg/dL (ref >39)
LDL Chol Calc (NIH): 88 mg/dL (ref 0–99)
Triglycerides: 91 mg/dL (ref 0–149)
VLDL Cholesterol Cal: 17 mg/dL (ref 5–40)

## 2023-10-30 LAB — HEPATITIS B SURFACE ANTIBODY,QUALITATIVE: Hep B Surface Ab, Qual: REACTIVE

## 2023-10-30 LAB — BMP8+EGFR
BUN/Creatinine Ratio: 27 — ABNORMAL HIGH (ref 9–23)
BUN: 22 mg/dL (ref 6–24)
CO2: 19 mmol/L — ABNORMAL LOW (ref 20–29)
Calcium: 9.4 mg/dL (ref 8.7–10.2)
Chloride: 106 mmol/L (ref 96–106)
Creatinine, Ser: 0.82 mg/dL (ref 0.57–1.00)
Glucose: 94 mg/dL (ref 70–99)
Potassium: 4 mmol/L (ref 3.5–5.2)
Sodium: 141 mmol/L (ref 134–144)
eGFR: 84 mL/min/1.73 (ref >59)

## 2023-10-30 LAB — HEMOGLOBIN A1C
Est. average glucose Bld gHb Est-mCnc: 128 mg/dL
Hgb A1c MFr Bld: 6.1 % — ABNORMAL HIGH (ref 4.8–5.6)

## 2023-10-31 LAB — MICROALBUMIN / CREATININE URINE RATIO
Creatinine, Urine: 144.8 mg/dL
Microalb/Creat Ratio: 11 mg/g{creat} (ref 0–29)
Microalbumin, Urine: 15.6 ug/mL

## 2023-11-04 ENCOUNTER — Other Ambulatory Visit: Payer: Self-pay | Admitting: Nurse Practitioner

## 2023-11-04 MED ORDER — MOUNJARO 12.5 MG/0.5ML ~~LOC~~ SOAJ: 12.5000 mg | SUBCUTANEOUS | 1 refills | Status: DC

## 2023-11-06 NOTE — Assessment & Plan Note (Signed)
 Cholesterol levels previously noted to have increased. - Continue current cholesterol management plan. - Monitor cholesterol levels regularly.

## 2023-11-06 NOTE — Assessment & Plan Note (Signed)
 Chronic insomnia with previous unsuccessful trials of Belsomra , Bebigo, temazepam , trazodone , and mirtazapine. Discussed potential trial of Lunesta. - Consider trial of Lunesta for insomnia.

## 2023-11-06 NOTE — Assessment & Plan Note (Signed)
 Suboptimal blood glucose monitoring due to work schedule constraints. - Encourage regular blood glucose monitoring. - Reschedule eye exam before year-end. - Check A1c. - Perform annual urine microalbumin test; provide cup for sample collectio

## 2023-12-02 ENCOUNTER — Other Ambulatory Visit: Payer: Self-pay

## 2023-12-02 MED ORDER — MOUNJARO 12.5 MG/0.5ML ~~LOC~~ SOAJ: 12.5000 mg | SUBCUTANEOUS | 1 refills | Status: AC

## 2023-12-06 ENCOUNTER — Encounter: Payer: Self-pay | Admitting: Nurse Practitioner

## 2023-12-19 ENCOUNTER — Encounter: Payer: Self-pay | Admitting: Nurse Practitioner

## 2023-12-22 ENCOUNTER — Encounter: Payer: Self-pay | Admitting: Nurse Practitioner

## 2023-12-22 DIAGNOSIS — L910 Hypertrophic scar: Secondary | ICD-10-CM | POA: Diagnosis not present

## 2023-12-22 DIAGNOSIS — Z7982 Long term (current) use of aspirin: Secondary | ICD-10-CM | POA: Diagnosis not present

## 2023-12-22 DIAGNOSIS — Z9049 Acquired absence of other specified parts of digestive tract: Secondary | ICD-10-CM | POA: Diagnosis not present

## 2023-12-22 DIAGNOSIS — Z1589 Genetic susceptibility to other disease: Secondary | ICD-10-CM | POA: Diagnosis not present

## 2023-12-22 DIAGNOSIS — Z15068 Genetic susceptibility to other malignant neoplasm of digestive system: Secondary | ICD-10-CM | POA: Diagnosis not present

## 2023-12-22 DIAGNOSIS — Z7952 Long term (current) use of systemic steroids: Secondary | ICD-10-CM | POA: Diagnosis not present

## 2023-12-22 DIAGNOSIS — E7800 Pure hypercholesterolemia, unspecified: Secondary | ICD-10-CM | POA: Diagnosis not present

## 2023-12-22 DIAGNOSIS — Z79899 Other long term (current) drug therapy: Secondary | ICD-10-CM | POA: Diagnosis not present

## 2023-12-22 DIAGNOSIS — K219 Gastro-esophageal reflux disease without esophagitis: Secondary | ICD-10-CM | POA: Diagnosis not present

## 2023-12-22 DIAGNOSIS — E119 Type 2 diabetes mellitus without complications: Secondary | ICD-10-CM | POA: Diagnosis not present

## 2023-12-22 DIAGNOSIS — Z1231 Encounter for screening mammogram for malignant neoplasm of breast: Secondary | ICD-10-CM | POA: Diagnosis not present

## 2023-12-22 DIAGNOSIS — Z1501 Genetic susceptibility to malignant neoplasm of breast: Secondary | ICD-10-CM | POA: Diagnosis not present

## 2023-12-22 DIAGNOSIS — Z9011 Acquired absence of right breast and nipple: Secondary | ICD-10-CM | POA: Diagnosis not present

## 2023-12-22 DIAGNOSIS — R928 Other abnormal and inconclusive findings on diagnostic imaging of breast: Secondary | ICD-10-CM | POA: Diagnosis not present

## 2023-12-22 DIAGNOSIS — Z1507 Genetic susceptibility to malignant neoplasm of urinary tract: Secondary | ICD-10-CM | POA: Diagnosis not present

## 2023-12-22 DIAGNOSIS — Z86000 Personal history of in-situ neoplasm of breast: Secondary | ICD-10-CM | POA: Diagnosis not present

## 2023-12-22 DIAGNOSIS — Z1509 Genetic susceptibility to other malignant neoplasm: Secondary | ICD-10-CM | POA: Diagnosis not present

## 2023-12-22 DIAGNOSIS — Z7985 Long-term (current) use of injectable non-insulin antidiabetic drugs: Secondary | ICD-10-CM | POA: Diagnosis not present

## 2023-12-22 DIAGNOSIS — Z7984 Long term (current) use of oral hypoglycemic drugs: Secondary | ICD-10-CM | POA: Diagnosis not present

## 2023-12-22 DIAGNOSIS — Z79810 Long term (current) use of selective estrogen receptor modulators (SERMs): Secondary | ICD-10-CM | POA: Diagnosis not present

## 2023-12-31 DIAGNOSIS — Z86000 Personal history of in-situ neoplasm of breast: Secondary | ICD-10-CM | POA: Diagnosis not present

## 2023-12-31 DIAGNOSIS — Z1507 Genetic susceptibility to malignant neoplasm of urinary tract: Secondary | ICD-10-CM | POA: Diagnosis not present

## 2023-12-31 DIAGNOSIS — Z15068 Genetic susceptibility to other malignant neoplasm of digestive system: Secondary | ICD-10-CM | POA: Diagnosis not present

## 2023-12-31 DIAGNOSIS — Z1501 Genetic susceptibility to malignant neoplasm of breast: Secondary | ICD-10-CM | POA: Diagnosis not present

## 2023-12-31 DIAGNOSIS — R928 Other abnormal and inconclusive findings on diagnostic imaging of breast: Secondary | ICD-10-CM | POA: Diagnosis not present

## 2023-12-31 DIAGNOSIS — Z1509 Genetic susceptibility to other malignant neoplasm: Secondary | ICD-10-CM | POA: Diagnosis not present

## 2023-12-31 DIAGNOSIS — Z1589 Genetic susceptibility to other disease: Secondary | ICD-10-CM | POA: Diagnosis not present

## 2024-01-13 ENCOUNTER — Telehealth: Payer: Self-pay

## 2024-01-13 NOTE — Telephone Encounter (Signed)
PA for [facility] sent to plan.

## 2024-02-17 ENCOUNTER — Other Ambulatory Visit: Payer: Self-pay | Admitting: Family Medicine

## 2024-02-23 ENCOUNTER — Other Ambulatory Visit: Payer: Self-pay | Admitting: Medical Genetics

## 2024-02-25 ENCOUNTER — Other Ambulatory Visit (HOSPITAL_COMMUNITY)
Admission: RE | Admit: 2024-02-25 | Discharge: 2024-02-25 | Disposition: A | Discharge status: Home / Self Care | Source: Ambulatory Visit | Attending: Medical Genetics | Admitting: Medical Genetics

## 2024-02-29 ENCOUNTER — Encounter: Payer: Self-pay | Admitting: Nurse Practitioner

## 2024-03-01 ENCOUNTER — Ambulatory Visit: Payer: Self-pay | Admitting: Nurse Practitioner

## 2024-03-08 ENCOUNTER — Ambulatory Visit: Payer: Self-pay | Admitting: Nurse Practitioner

## 2024-03-09 ENCOUNTER — Ambulatory Visit: Admitting: Nurse Practitioner

## 2024-04-02 ENCOUNTER — Other Ambulatory Visit (HOSPITAL_COMMUNITY)
